# Patient Record
Sex: Male | Born: 1940 | Race: White | Hispanic: No | Marital: Married | Smoking: Former smoker
Health system: Southern US, Community
[De-identification: ages and names within clinical notes are randomized; demographics above are authoritative.]

## PROBLEM LIST (undated history)

## (undated) DIAGNOSIS — I2510 Atherosclerotic heart disease of native coronary artery without angina pectoris: Secondary | ICD-10-CM

## (undated) DIAGNOSIS — K219 Gastro-esophageal reflux disease without esophagitis: Secondary | ICD-10-CM

## (undated) DIAGNOSIS — E785 Hyperlipidemia, unspecified: Secondary | ICD-10-CM

## (undated) DIAGNOSIS — G629 Polyneuropathy, unspecified: Secondary | ICD-10-CM

## (undated) DIAGNOSIS — M1990 Unspecified osteoarthritis, unspecified site: Secondary | ICD-10-CM

## (undated) DIAGNOSIS — J342 Deviated nasal septum: Secondary | ICD-10-CM

## (undated) DIAGNOSIS — E669 Obesity, unspecified: Secondary | ICD-10-CM

## (undated) DIAGNOSIS — Z951 Presence of aortocoronary bypass graft: Secondary | ICD-10-CM

## (undated) DIAGNOSIS — R0602 Shortness of breath: Secondary | ICD-10-CM

## (undated) DIAGNOSIS — I219 Acute myocardial infarction, unspecified: Secondary | ICD-10-CM

## (undated) DIAGNOSIS — G4730 Sleep apnea, unspecified: Secondary | ICD-10-CM

## (undated) DIAGNOSIS — R112 Nausea with vomiting, unspecified: Secondary | ICD-10-CM

## (undated) DIAGNOSIS — I509 Heart failure, unspecified: Secondary | ICD-10-CM

## (undated) DIAGNOSIS — I6521 Occlusion and stenosis of right carotid artery: Secondary | ICD-10-CM

## (undated) DIAGNOSIS — I10 Essential (primary) hypertension: Secondary | ICD-10-CM

## (undated) DIAGNOSIS — Z9889 Other specified postprocedural states: Secondary | ICD-10-CM

## (undated) DIAGNOSIS — Z973 Presence of spectacles and contact lenses: Secondary | ICD-10-CM

## (undated) DIAGNOSIS — C4492 Squamous cell carcinoma of skin, unspecified: Secondary | ICD-10-CM

## (undated) DIAGNOSIS — E041 Nontoxic single thyroid nodule: Secondary | ICD-10-CM

## (undated) DIAGNOSIS — E039 Hypothyroidism, unspecified: Secondary | ICD-10-CM

## (undated) HISTORY — PX: CARDIAC CATHETERIZATION: SHX172

## (undated) HISTORY — PX: TRANSESOPHAGEAL ECHOCARDIOGRAM: SHX273

## (undated) HISTORY — PX: COLONOSCOPY: SHX174

## (undated) HISTORY — DX: Acute myocardial infarction, unspecified: I21.9

## (undated) HISTORY — DX: Nontoxic single thyroid nodule: E04.1

## (undated) HISTORY — DX: Essential (primary) hypertension: I10

## (undated) HISTORY — PX: NASAL SEPTUM SURGERY: SHX37

## (undated) HISTORY — DX: Hyperlipidemia, unspecified: E78.5

## (undated) HISTORY — DX: Presence of spectacles and contact lenses: Z97.3

## (undated) HISTORY — PX: CORONARY STENT PLACEMENT: SHX1402

## (undated) HISTORY — DX: Deviated nasal septum: J34.2

## (undated) HISTORY — DX: Sleep apnea, unspecified: G47.30

## (undated) HISTORY — PX: CAROTID ENDARTERECTOMY: SUR193

## (undated) HISTORY — DX: Gastro-esophageal reflux disease without esophagitis: K21.9

## (undated) HISTORY — DX: Heart failure, unspecified: I50.9

## (undated) HISTORY — DX: Atherosclerotic heart disease of native coronary artery without angina pectoris: I25.10

## (undated) HISTORY — PX: UPPER GASTROINTESTINAL ENDOSCOPY: SHX188

## (undated) HISTORY — PX: ADENOIDECTOMY: SUR15

## (undated) HISTORY — DX: Obesity, unspecified: E66.9

## (undated) HISTORY — DX: Occlusion and stenosis of right carotid artery: I65.21

---
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
## 1995-08-15 DIAGNOSIS — I219 Acute myocardial infarction, unspecified: Secondary | ICD-10-CM

## 1995-08-15 HISTORY — DX: Acute myocardial infarction, unspecified: I21.9

## 2006-12-27 ENCOUNTER — Ambulatory Visit: Payer: Self-pay | Admitting: *Deleted

## 2007-06-27 ENCOUNTER — Ambulatory Visit: Payer: Self-pay | Admitting: *Deleted

## 2008-01-08 ENCOUNTER — Ambulatory Visit: Payer: Self-pay | Admitting: *Deleted

## 2008-02-29 ENCOUNTER — Emergency Department (HOSPITAL_COMMUNITY): Admission: EM | Admit: 2008-02-29 | Discharge: 2008-02-29 | Payer: Self-pay | Admitting: Emergency Medicine

## 2008-07-08 ENCOUNTER — Ambulatory Visit: Payer: Self-pay | Admitting: *Deleted

## 2008-07-21 ENCOUNTER — Ambulatory Visit: Payer: Self-pay | Admitting: Vascular Surgery

## 2008-11-25 ENCOUNTER — Emergency Department (HOSPITAL_COMMUNITY): Admission: EM | Admit: 2008-11-25 | Discharge: 2008-11-25 | Payer: Self-pay | Admitting: Emergency Medicine

## 2009-02-09 ENCOUNTER — Ambulatory Visit: Payer: Self-pay | Admitting: Vascular Surgery

## 2009-08-11 ENCOUNTER — Ambulatory Visit: Payer: Self-pay | Admitting: Vascular Surgery

## 2010-01-20 ENCOUNTER — Ambulatory Visit: Payer: Self-pay | Admitting: Vascular Surgery

## 2010-07-27 ENCOUNTER — Ambulatory Visit: Payer: Self-pay | Admitting: Vascular Surgery

## 2010-09-02 ENCOUNTER — Ambulatory Visit (HOSPITAL_COMMUNITY)
Admission: RE | Admit: 2010-09-02 | Discharge: 2010-09-02 | Payer: Self-pay | Source: Home / Self Care | Attending: Internal Medicine | Admitting: Internal Medicine

## 2010-09-02 IMAGING — US US BIOPSY
1 series · 8 of 8 positions shown · non-contrast
Comparison: none

CLINICAL DATA: Complex cystic and solid right thyroid mass

ULTRASOUND GUIDED FINE NEEDLE ASPIRATION BIOPSY OF THYROID NODULE:
TECHNIQUE: Written informed consent for procedure obtained after discussion of
procedure and risks.
Time-out protocol performed.
Right lobe thyroid mass localized by ultrasound.
Mass measured 3.7 x 4.0 x 3.8 cm in size on sonography performed
immediately prior to the biopsy procedure.

[Series 1: us biopsy · 0.11mm/px · 8 of 8 slices shown]
[im 1/8]
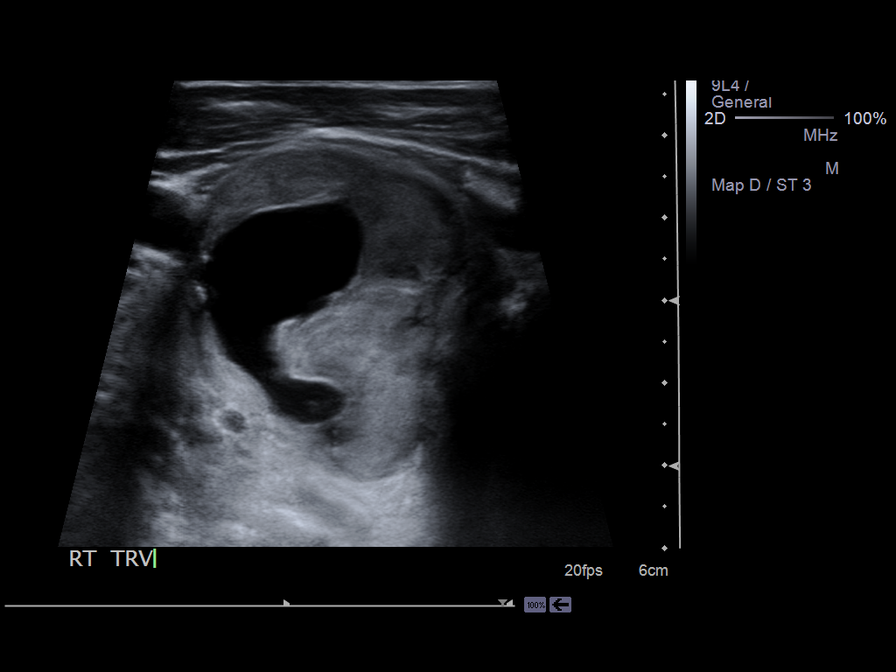
[im 2/8]
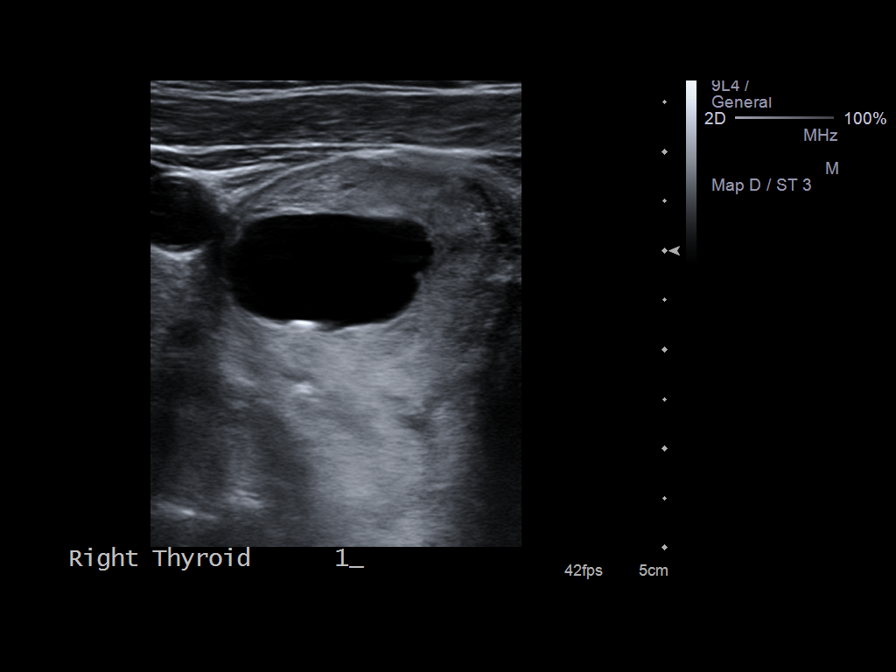
[im 3/8]
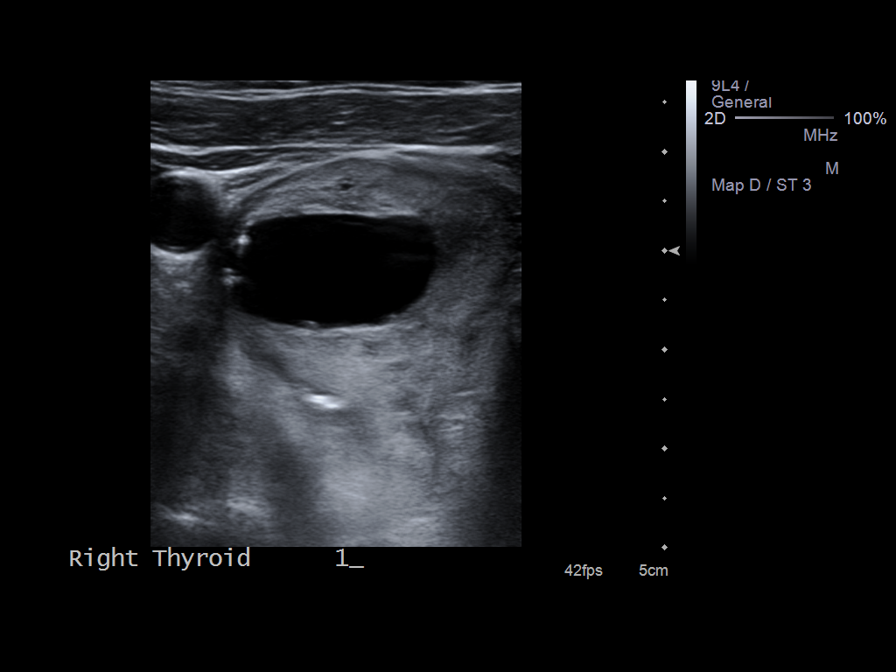
[im 4/8]
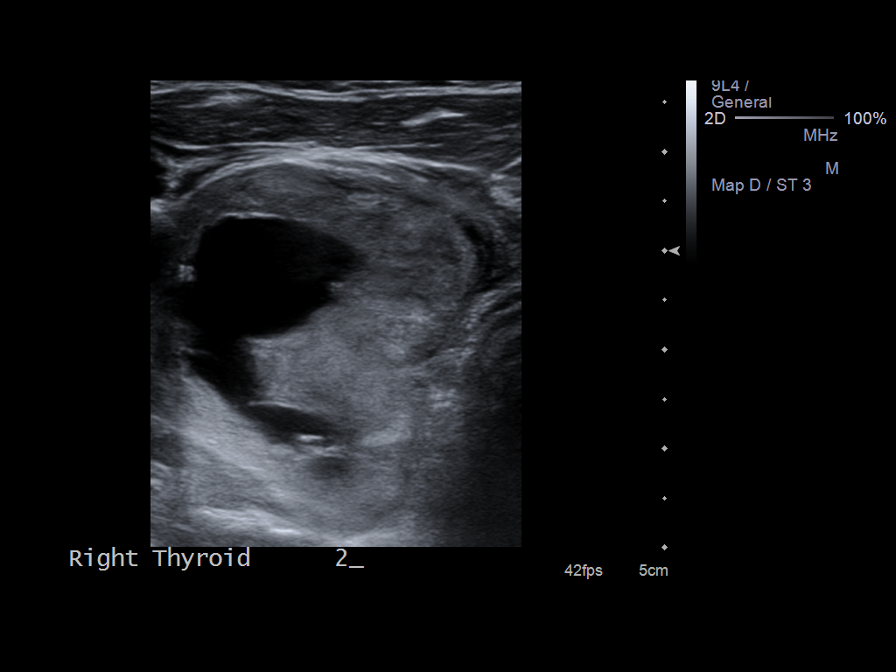
[im 5/8]
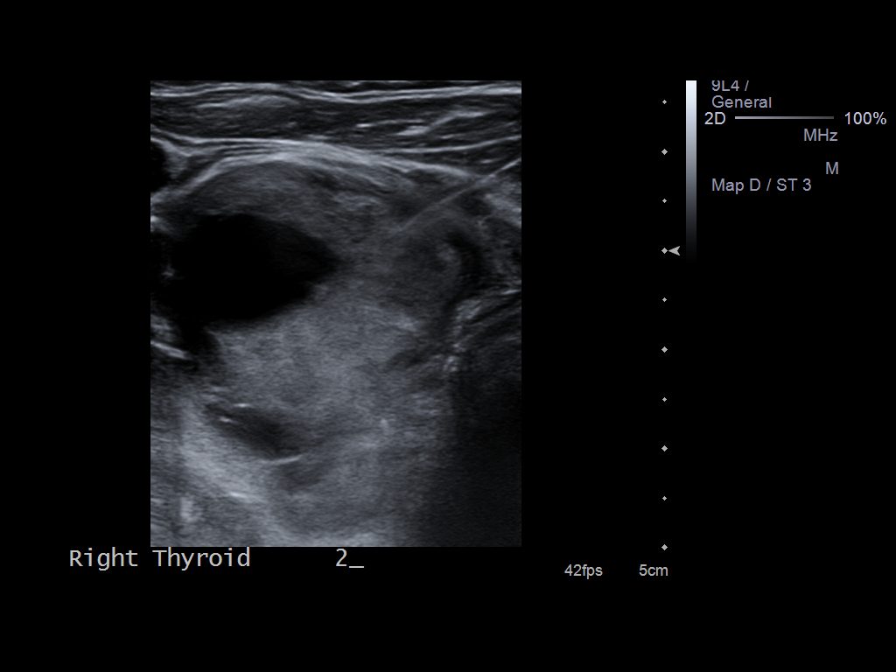
[im 6/8]
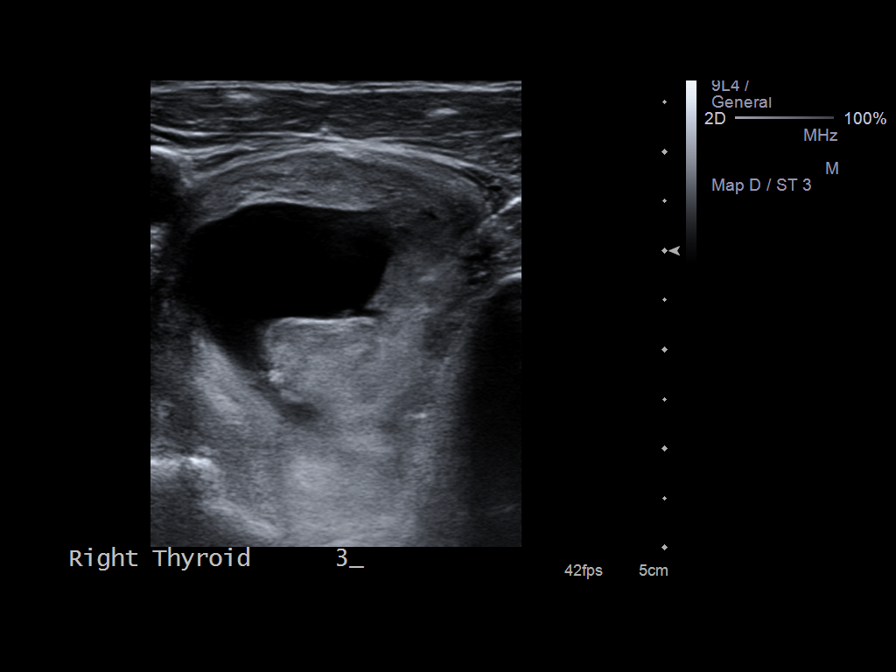
[im 7/8]
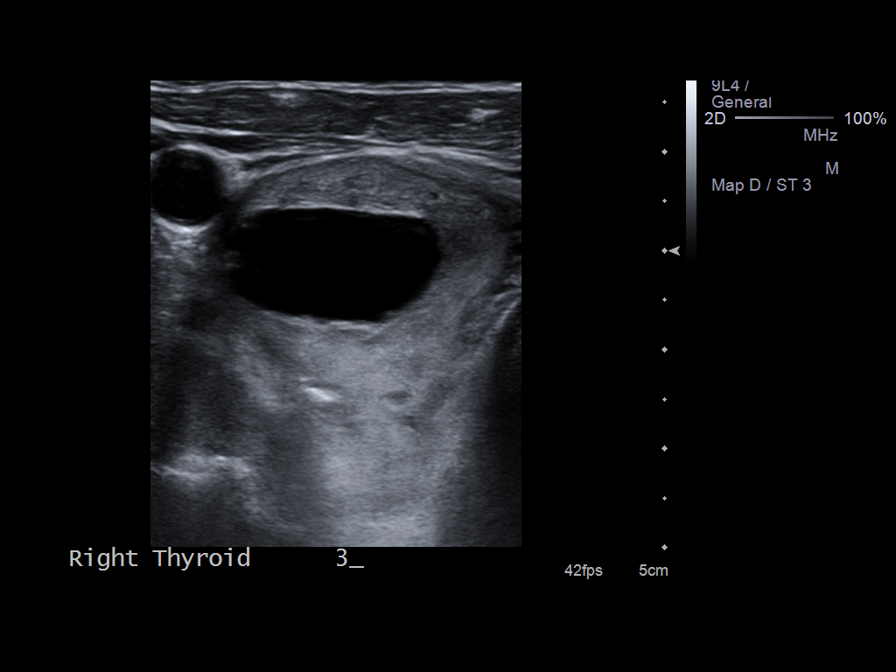
[im 8/8]
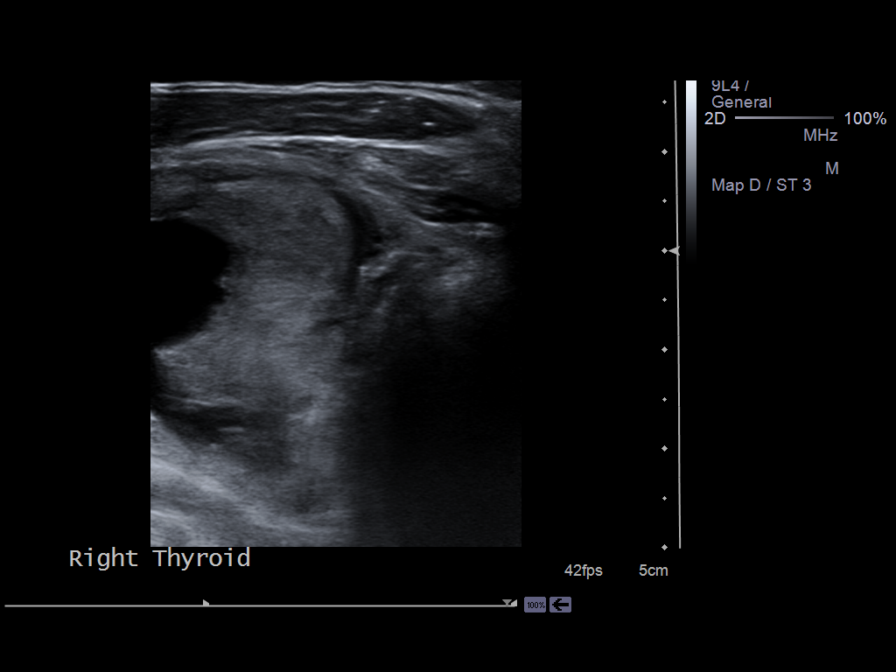

[8 of 8 positions shown; findings below may reference images not displayed]

Skin prepped and draped in usual sterile fashion.
Skin and soft tissues anesthestized with 2 ml of 1% lidocaine.
Under sonographic guidance, three 25-gauge fine needle aspiration
biopsies of the solid portion of the complex cystic/solid right
thyroid mass were performed.
Procedure tolerated well by patient without immediate complication.
Hemostasis obtained by manual compression.
No evidence of hematoma on postprocedural images.
Standard post procedure instructions were given to patient.
Specimen sent to laboratory for routine cytologic analysis.
IMPRESSION: Ultrasound guided fine needle aspiration biopsy of a right lobe
thyroid mass.

## 2010-09-02 IMAGING — US US SOFT TISSUE HEAD/NECK
1 series · 14 of 25 positions shown · non-contrast
Comparison: None

CLINICAL DATA: Right thyroid nodule

THYROID ULTRASOUND
TECHNIQUE: Ultrasound examination of the thyroid gland and adjacent
soft tissues was performed.

[Series 1: us soft tissue head/neck · 0.11mm/px · 14 of 32 slices shown]
[im 1/32]
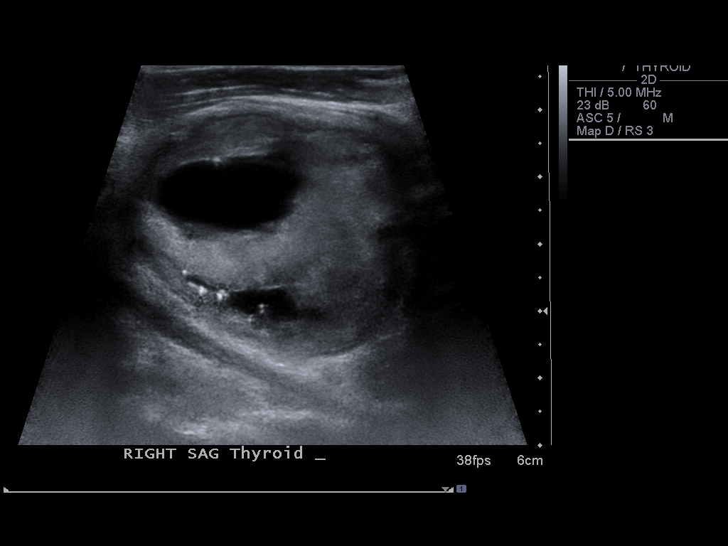
[im 3/32]
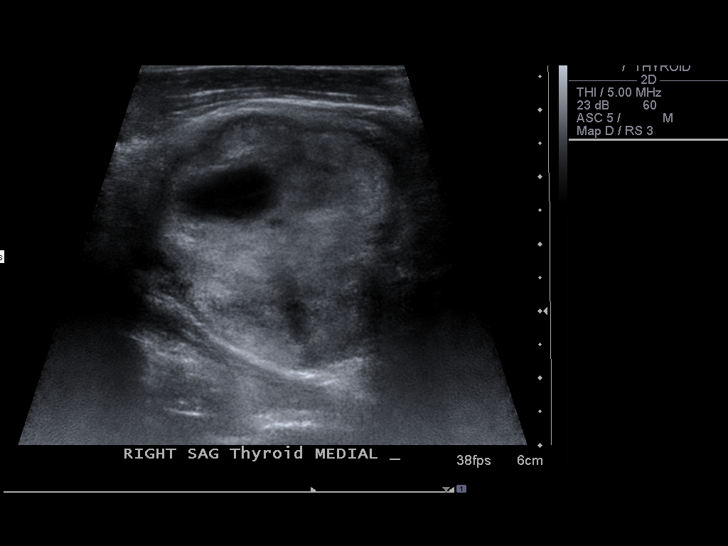
[im 6/32]
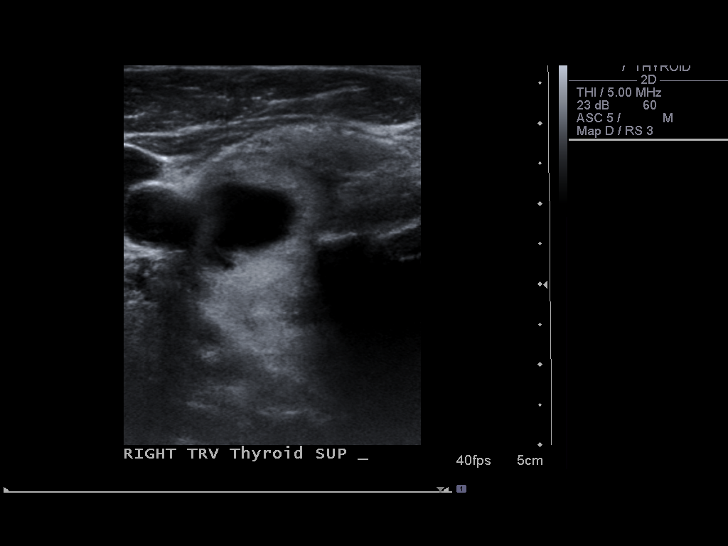
[im 8/32]
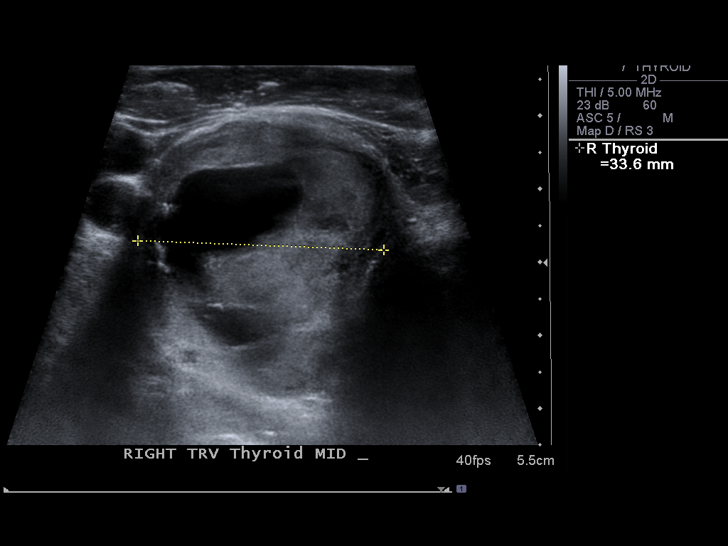
[im 11/32]
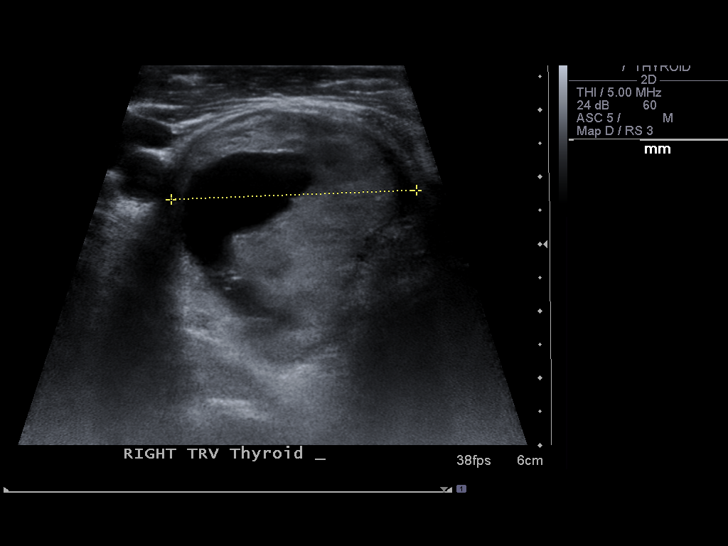
[im 12/32]
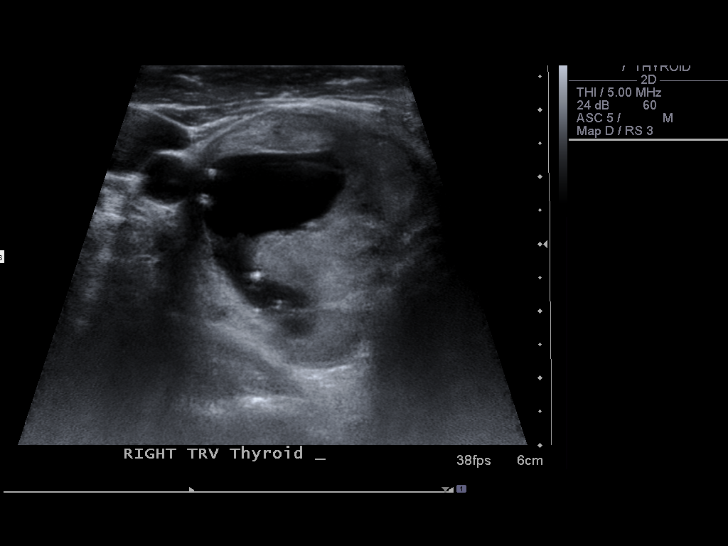
[im 15/32]
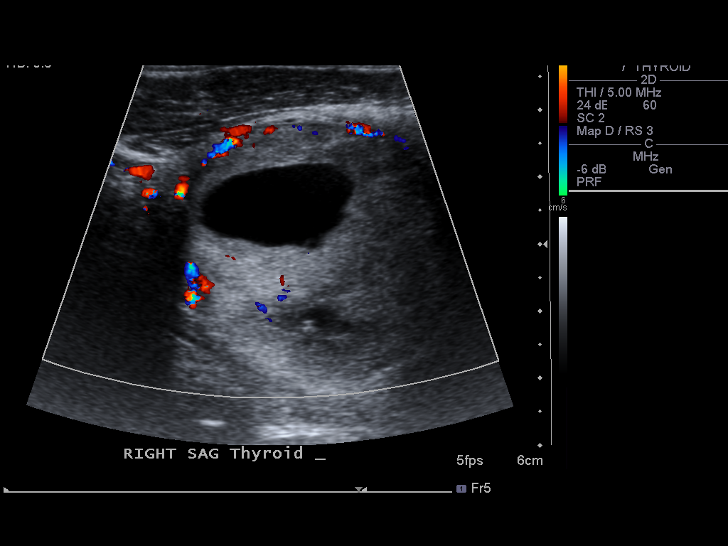
[im 17/32]
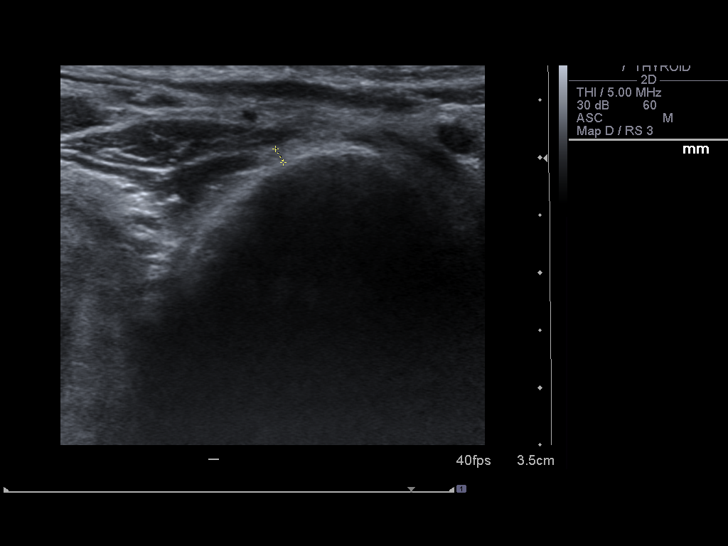
[im 20/32]
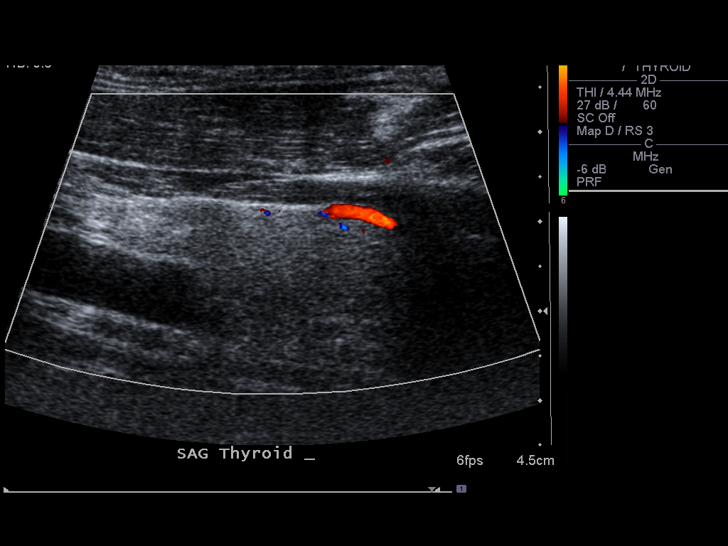
[im 21/32]
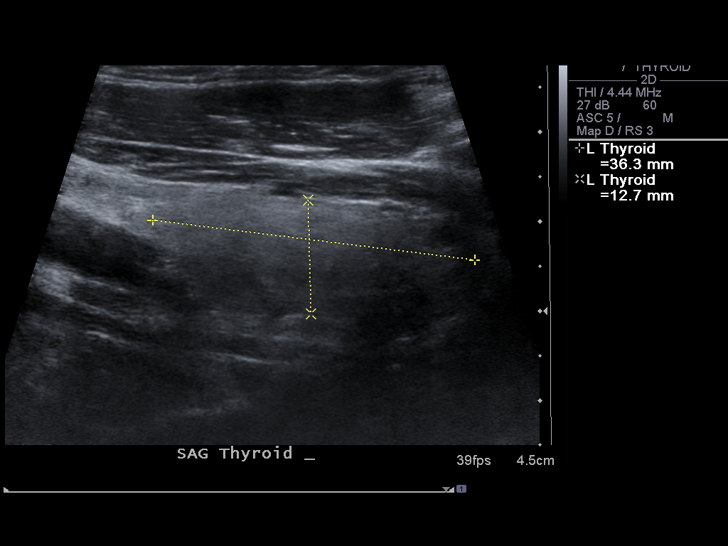
[im 24/32]
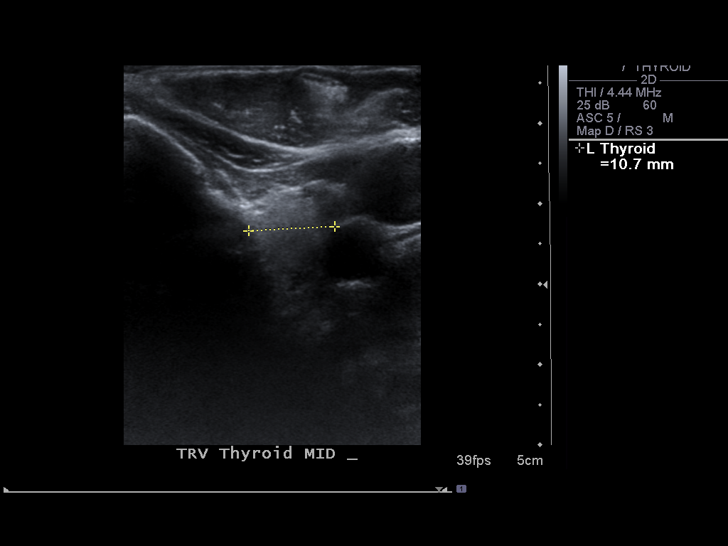
[im 26/32]
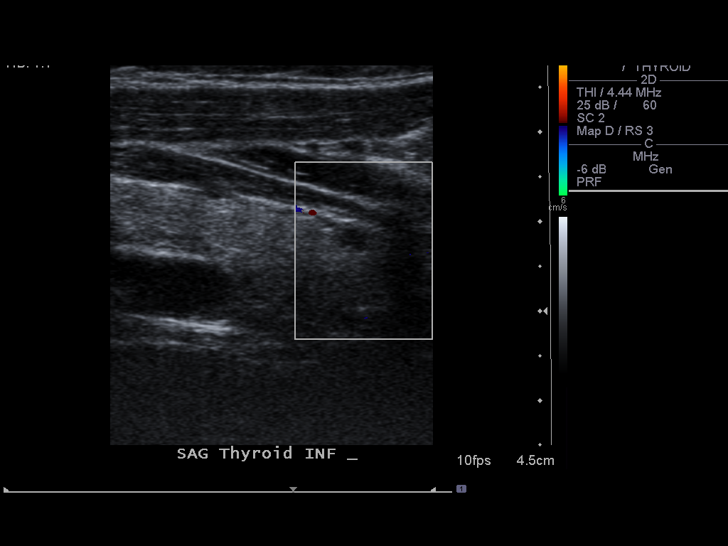
[im 29/32]
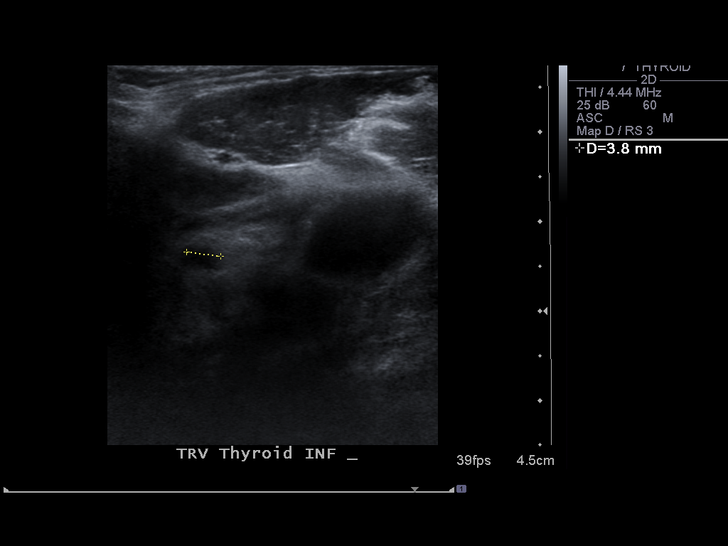
[im 32/32]
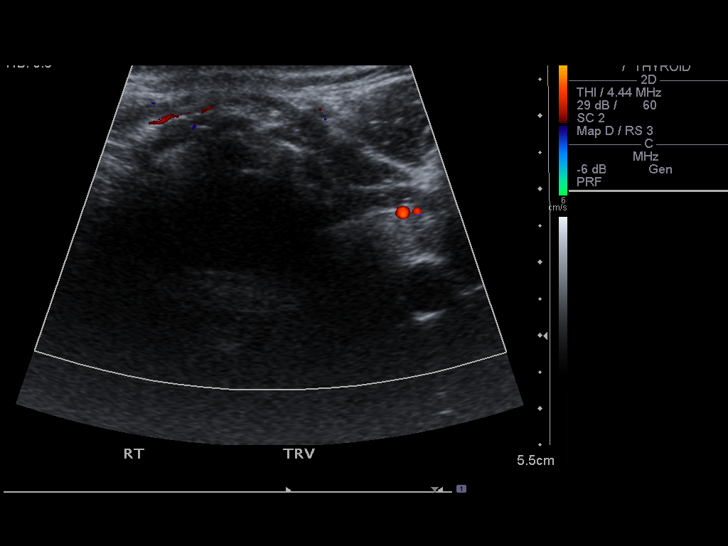

[14 of 25 positions shown; findings below may reference images not displayed]

FINDINGS: Right thyroid lobe:  4.9 x 3.4 x 3.4 cm
Left thyroid lobe:  3.6 x 1.3 x 1.1 cm
Isthmus:  1 mm thick, normal

Focal nodules:  Homogeneous thyroid parenchyma.  Dominant mass
inferior pole right thyroid lobe, 3.7 x 4.0 x 3.8 cm, containing
soft tissue components, areas of cystic change, as well as a few
tiny hyperechoic calcifications.  Mass is indeterminate in
etiology.  Additional tiny nodule 3 x 3 x 4 mm diameter inferior
pole left lobe.

Lymphadenopathy:  Not identified
IMPRESSION: Dominant right thyroid mass at inferior pole, complex cystic and
solid in appearance, 3.7 x 4.0 x 3.8 cm.
Malignancy is not excluded.
Tissue diagnosis is recommended.
Lesion is accessible by ultrasound-guided fine needle aspiration
biopsy.

## 2010-12-27 NOTE — Procedures (Signed)
CAROTID DUPLEX EXAM   INDICATION:  Carotid disease.   HISTORY:  Diabetes:  No.  Cardiac:  MI, PTCA, stents.  Hypertension:  Yes.  Smoking:  No.  Previous Surgery:  No.  CV History:  Asymptomatic.  Amaurosis Fugax No, Paresthesias No, Hemiparesis No.                                       RIGHT             LEFT  Brachial systolic pressure:         140               142  Brachial Doppler waveforms:         Normal            Normal  Vertebral direction of flow:        Antegrade         Not visualized  DUPLEX VELOCITIES (cm/sec)  CCA peak systolic                   94                71  ECA peak systolic                   262               138  ICA peak systolic                   285               148  ICA end diastolic                   91                50  PLAQUE MORPHOLOGY:                  Mixed             Mixed  PLAQUE AMOUNT:                      Moderate/severe   Moderate  PLAQUE LOCATION:                    ICA/ECA           ICA/ECA   IMPRESSION:  1. High-end 60-79% stenosis of the right internal carotid artery.  2. 40-59% stenosis of the left internal carotid artery.  3. No significant change noted when compared to the previous      examination on 07/08/08.   ___________________________________________  Di Kindle. Edilia Bo, M.D.   CH/MEDQ  D:  02/09/2009  T:  02/09/2009  Job:  [id number]

## 2010-12-27 NOTE — Procedures (Signed)
CAROTID DUPLEX EXAM   INDICATION:  Followup evaluation of known coronary artery disease.  Previous study performed on 12/27/2006 at [facility]  revealed a 60-79% right ICA stenosis and a 40-59% left ICA stenosis.   HISTORY:  Diabetes:  No  Cardiac:  History of MI and PTCA and stent in 1995  Hypertension:  Yes  Smoking:  Previous smoker  Previous Surgery:  No  CV History:  The patient reports a 24-hour dizzy spell approximately two  months ago  Amaurosis Fugax No, Paresthesias No, Hemiparesis No                                       RIGHT             LEFT  Brachial systolic pressure:         188               186  Brachial Doppler waveforms:         Triphasic         Triphasic  Vertebral direction of flow:        Antegrade         Occluded  DUPLEX VELOCITIES (cm/sec)  CCA peak systolic                   69                60  ECA peak systolic                   245               117  ICA peak systolic                   215               104  ICA end diastolic                   70                35  PLAQUE MORPHOLOGY:                  Mixed irregular   Mixed irregular  PLAQUE AMOUNT:                      Moderate          Moderate  PLAQUE LOCATION:                    Proximal ICA      Proximal ICA   IMPRESSION:  1. 60-79% right ICA stenosis.  2. 20-39% left ICA stenosis.  3. Right ECA stenosis.  4. Left vertebral artery occlusion.  5. No significant change since previous study.   ___________________________________________  P. Liliane Bade, M.D.   MC/MEDQ  D:  06/27/2007  T:  06/28/2007  Job:  [id number]

## 2010-12-27 NOTE — Procedures (Signed)
CAROTID DUPLEX EXAM   INDICATION:  Followup carotid artery disease.   HISTORY:  Diabetes:  No.  Cardiac:  MI, PTCA, stents.  Hypertension:  Yes.  Smoking:  No.  Previous Surgery:  No.  CV History:  Asymptomatic.  Amaurosis Fugax No, Paresthesias No, Hemiparesis No                                       RIGHT             LEFT  Brachial systolic pressure:         162               168  Brachial Doppler waveforms:         Normal            Normal  Vertebral direction of flow:        Antegrade         Not visualized  DUPLEX VELOCITIES (cm/sec)  CCA peak systolic                   68                64  ECA peak systolic                   328               134  ICA peak systolic                   258               127  ICA end diastolic                   94                43  PLAQUE MORPHOLOGY:                  Mixed             Mixed  PLAQUE AMOUNT:                      Moderate to severe                  Moderate  PLAQUE LOCATION:                    ICA, ECA          ICA, ECA   IMPRESSION:  1. Doppler velocity suggests high end 60%-79% stenosis in right      internal carotid artery.  Doppler velocity suggests 40%-59%      stenosis in left internal carotid artery.  2. Bilateral external carotid artery stenosis.  3. Antegrade in right vertebral artery.  Left vertebral artery not      visualized.   ___________________________________________  Di Kindle. Edilia Bo, M.D.   NT/MEDQ  D:  01/20/2010  T:  01/20/2010  Job:  [id number]

## 2010-12-27 NOTE — Assessment & Plan Note (Signed)
OFFICE VISIT   [name], [name]  DOB:  12/20/1940                                       07/21/2008  EAVWU#:[id number]   I saw the patient in the office today for continued followup of his  carotid disease.  This is a pleasant 70 year old gentleman who I have  been following with a moderate right carotid stenosis which has been in  the 60-79% range.  He comes in for a 6 month followup visit.  Of note,  he is right-handed.  He has had no previous history of stroke, TIAs,  expressive or receptive aphasia or amaurosis fugax.  He does state that  he has had occasional episodes of paresthesias in the left hand which  usually occurs when he is sleeping in a certain position or when he is  driving with his left arm up on the wheel.  His symptoms are relieved  with the change in position of his arms.  Based on his history it does  not sound like he is having right hemispheric TIAs.  He does take 81 mg  of aspirin a day.   PAST MEDICAL HISTORY:  Is significant for myocardial infarction in the  mid 90s.  He quit tobacco in 1995.   REVIEW OF SYSTEMS:  CARDIOVASCULAR:  He has had no recent chest pain,  chest pressure, palpitations or arrhythmias.  He does admit to some  dyspnea on exertion.  PULMONARY:  He has had no productive cough, bronchitis, asthma or  wheezing.  Review of systems is otherwise unremarkable.   PHYSICAL EXAMINATION:  General:  This is a pleasant 70 year old  gentleman who appears his stated age.  Vital signs:  His blood pressure  is 175/88, heart rate is 59.  Neck:  Supple.  He has no cervical  lymphadenopathy.  I cannot detect a carotid bruit today.  Lungs:  Clear  bilaterally to auscultation.  Cardiac:  He has a regular rate and  rhythm.  Abdomen:  Soft and nontender.  He has palpable femoral pulses  and warm well-perfused feet without ischemic ulceration.  He has no  significant lower extremity swelling.  Neurologic:  Exam is nonfocal.   His  carotid duplex scan shows he has a 60-79% right carotid stenosis.  The velocities have remained fairly stable since his previous study back  in May of this year.  On the left side he has a 40-59% stenosis.   Again, I am not convinced that the occasional paresthesias that he is  having in his hand which appear to be positional are related to his  carotid disease.  For this reason we would not consider right carotid  endarterectomy unless the stenosis progressed to greater than 80% or he  developed clear-cut right hemispheric symptoms.  We will see him back in  6 months with a followup duplex scan.  He knows to call sooner if he has  problems.  In the meantime he knows to continue taking his aspirin.  He  will follow up with his primary care physician concerning his blood  pressure.   Di Kindle. Edilia Bo, M.D.  Electronically Signed   CSD/MEDQ  D:  07/21/2008  T:  07/22/2008  Job:  [id number]   cc:   Catalina Pizza, M.D.

## 2010-12-27 NOTE — Procedures (Signed)
CAROTID DUPLEX EXAM   INDICATION:  Followup carotid artery disease.   HISTORY:  Diabetes:  No.  Cardiac:  MI, PTCA, stents.  Hypertension:  Yes.  Smoking:  No.  Previous Surgery:  No.  CV History:  Asymptomatic.  Amaurosis Fugax No, Paresthesias No, Hemiparesis No                                       RIGHT             LEFT  Brachial systolic pressure:         172               166  Brachial Doppler waveforms:         WNL               WNL  Vertebral direction of flow:        Antegrade         Not visualized  DUPLEX VELOCITIES (cm/sec)  CCA peak systolic                   84                68  ECA peak systolic                   331               214  ICA peak systolic                   277               150  ICA end diastolic                   95                47  PLAQUE MORPHOLOGY:                  Mixed             Mixed  PLAQUE AMOUNT:                      Moderate / severe Moderate  PLAQUE LOCATION:                    ICA / ECA         ICA / ECA   IMPRESSION:  1. Right internal carotid artery shows evidence of 60%-79% stenosis      (top end of range).  2. Left internal carotid artery shows evidence of 40%-59% stenosis.  3. Bilateral external carotid artery stenosis.  4. Right vertebral artery appears antegrade, left vertebral artery      flow not visualized.  5. No significant changes from previous study.   ___________________________________________  Di Kindle. Edilia Bo, M.D.   AS/MEDQ  D:  08/11/2009  T:  08/11/2009  Job:  [id number]

## 2010-12-27 NOTE — Assessment & Plan Note (Signed)
OFFICE VISIT   BEAU, RAMSBURG  DOB:  [birth year]                                       02/09/2009  OZHYQ#:[id number]   I saw the patient in the office today for continued followup of his  carotid disease.  This is a pleasant 70 year old gentleman who I have  been following with bilateral carotid disease.  Since I saw him last in  December of 2009 he denies any history of stroke, TIAs, expressive or  receptive aphasia, or amaurosis fugax.  He continues on an aspirin a  day.   SOCIAL HISTORY:  He quit tobacco several years ago.   REVIEW OF SYSTEMS:  He has had no recent chest pain, chest pressure,  palpitations or arrhythmias.  He has had no productive cough,  bronchitis, asthma or wheezing.   PHYSICAL EXAMINATION:  This is a pleasant 70 year old gentleman who  appears his stated age.  His blood pressure is 180/96, heart rate is 57.  Neck is supple.  He has a right carotid bruit.  Lungs are clear  bilaterally to auscultation.  On cardiac exam he has a regular rate and  rhythm.  Neurologic exam is nonfocal.   Carotid duplex scan shows a 60-79% right carotid stenosis.  The  velocities have not changed significantly over the last 6 months.  On  the left side he has a 40-59% stenosis.   He understands we would not consider right carotid endarterectomy unless  the stenosis progressed to greater than 80% or he developed new  neurologic symptoms.  I will see him back in 6 months with a followup  duplex scan.  He knows to call sooner if he has problems.  In the  meantime he knows to continue taking his aspirin.   Di Kindle. Edilia Bo, M.D.  Electronically Signed   CSD/MEDQ  D:  02/09/2009  T:  02/10/2009  Job:  [id number]   cc:   Catalina Pizza, M.D.

## 2010-12-27 NOTE — Procedures (Signed)
CAROTID DUPLEX EXAM   INDICATION:  Followup carotid artery disease.   HISTORY:  Diabetes:  No.  Cardiac:  MI, PTCA and stents 1995.  Hypertension:  Yes.  Smoking:  No.  Previous Surgery:  No.  CV History:  No.  Amaurosis Fugax No, Paresthesias , Hemiparesis No  Intermittent numbness in arms for approximately the last 2 weeks when  waking up or driving L > R.                                       RIGHT             LEFT  Brachial systolic pressure:         172               160  Brachial Doppler waveforms:         WNL               WNL  Vertebral direction of flow:        Antegrade         Not visualized  DUPLEX VELOCITIES (cm/sec)  CCA peak systolic                   83                63  ECA peak systolic                   323               138  ICA peak systolic                   280               121  ICA end diastolic                   95                42  PLAQUE MORPHOLOGY:                  Calcified         Mixed  PLAQUE AMOUNT:                      Moderate/severe   Mild  PLAQUE LOCATION:                    ICA/ECA           ICA/ECA   IMPRESSION:  1. Right ICA shows evidence of 60-79% stenosis.  2. Left ICA shows evidence of 40-59% stenosis (low end of range).  3. Left ECA stenosis.  4. Left vertebral artery flow not detected, possible occlusion.  Right      is antegrade.  5. No significant changes from previous study.  6. Dr. Arbie Cookey was notified of results and followup scheduled with Dr.      Edilia Bo.   ___________________________________________  [name] [name]. Edilia Bo, M.D.   AS/MEDQ  D:  07/08/2008  T:  07/08/2008  Job:  [id number]

## 2010-12-27 NOTE — Procedures (Signed)
CAROTID DUPLEX EXAM   INDICATION:  Followup, carotid artery disease.   HISTORY:  Diabetes:  No.  Cardiac:  History of MI, PTCA, and stent in 1995.  Hypertension:  Yes.  Smoking:  No.  Previous Surgery:  No.  CV History:  No.  Amaurosis Fugax No, Paresthesias No, Hemiparesis No                                       RIGHT             LEFT  Brachial systolic pressure:         170               172  Brachial Doppler waveforms:         Normal            Normal  Vertebral direction of flow:        Antegrade         Occluded  DUPLEX VELOCITIES (cm/sec)  CCA peak systolic                   110               69  ECA peak systolic                   343               105  ICA peak systolic                   221               120  ICA end diastolic                   74                33  PLAQUE MORPHOLOGY:                  Calcific          Heterogenous  PLAQUE AMOUNT:                      Moderate          Mild-to-moderate  PLAQUE LOCATION:                    ECA/ICA           ICA/ECA   IMPRESSION:  1. 60-79% stenosis of the right internal carotid artery.  2. High-end 20-39% stenosis of the left internal carotid artery.  3. The left vertebral artery appears occluded.  4. There is an incidental finding of a homogenous mass with a cystic      component noted in the right thyroid, measuring >3 cm.   ___________________________________________  P. Liliane Bade, M.D.   CH/MEDQ  D:  01/08/2008  T:  01/08/2008  Job:  [id number]   cc:   Dr. Winifred Olive

## 2011-01-18 ENCOUNTER — Encounter (INDEPENDENT_AMBULATORY_CARE_PROVIDER_SITE_OTHER): Payer: Self-pay | Admitting: Surgery

## 2011-01-23 ENCOUNTER — Encounter (INDEPENDENT_AMBULATORY_CARE_PROVIDER_SITE_OTHER): Payer: Self-pay | Admitting: Surgery

## 2011-01-30 ENCOUNTER — Encounter (INDEPENDENT_AMBULATORY_CARE_PROVIDER_SITE_OTHER): Payer: Self-pay | Admitting: General Surgery

## 2011-02-02 ENCOUNTER — Other Ambulatory Visit (INDEPENDENT_AMBULATORY_CARE_PROVIDER_SITE_OTHER): Payer: Self-pay | Admitting: Surgery

## 2011-02-02 ENCOUNTER — Encounter (HOSPITAL_COMMUNITY)
Admission: RE | Admit: 2011-02-02 | Discharge: 2011-02-02 | Disposition: A | Discharge status: Home / Self Care | Payer: Medicare HMO | Source: Ambulatory Visit | Attending: Surgery | Admitting: Surgery

## 2011-02-02 DIAGNOSIS — E041 Nontoxic single thyroid nodule: Secondary | ICD-10-CM

## 2011-02-02 LAB — URINALYSIS, ROUTINE W REFLEX MICROSCOPIC
Leukocytes, UA: NEGATIVE
Nitrite: NEGATIVE
Specific Gravity, Urine: 1.026 (ref 1.005–1.030)
Urobilinogen, UA: 0.2 mg/dL (ref 0.0–1.0)
pH: 6.5 (ref 5.0–8.0)

## 2011-02-02 LAB — CBC
Hemoglobin: 14.7 g/dL (ref 13.0–17.0)
MCHC: 34.7 g/dL (ref 30.0–36.0)
WBC: 7.7 10*3/uL (ref 4.0–10.5)

## 2011-02-02 LAB — DIFFERENTIAL
Basophils Absolute: 0 10*3/uL (ref 0.0–0.1)
Basophils Relative: 1 % (ref 0–1)
Lymphocytes Relative: 33 % (ref 12–46)
Monocytes Absolute: 0.7 10*3/uL (ref 0.1–1.0)
Neutro Abs: 4.1 10*3/uL (ref 1.7–7.7)
Neutrophils Relative %: 53 % (ref 43–77)

## 2011-02-02 LAB — PROTIME-INR
INR: 0.99 (ref 0.00–1.49)
Prothrombin Time: 13.3 seconds (ref 11.6–15.2)

## 2011-02-02 LAB — SURGICAL PCR SCREEN
MRSA, PCR: NEGATIVE
Staphylococcus aureus: NEGATIVE

## 2011-02-02 LAB — BASIC METABOLIC PANEL
Calcium: 9.8 mg/dL (ref 8.4–10.5)
Chloride: 102 mEq/L (ref 96–112)
Creatinine, Ser: 1.27 mg/dL (ref 0.50–1.35)
GFR calc Af Amer: >60 mL/min (ref >60)
Sodium: 139 mEq/L (ref 135–145)

## 2011-02-02 IMAGING — CR DG CHEST 2V
2 series · 2 of 2 positions shown · non-contrast
Comparison: None.

CLINICAL DATA: Preop for resection of thyroid nodule, former smoker

CHEST - 2 VIEW

[view not recorded (1 of 2)]
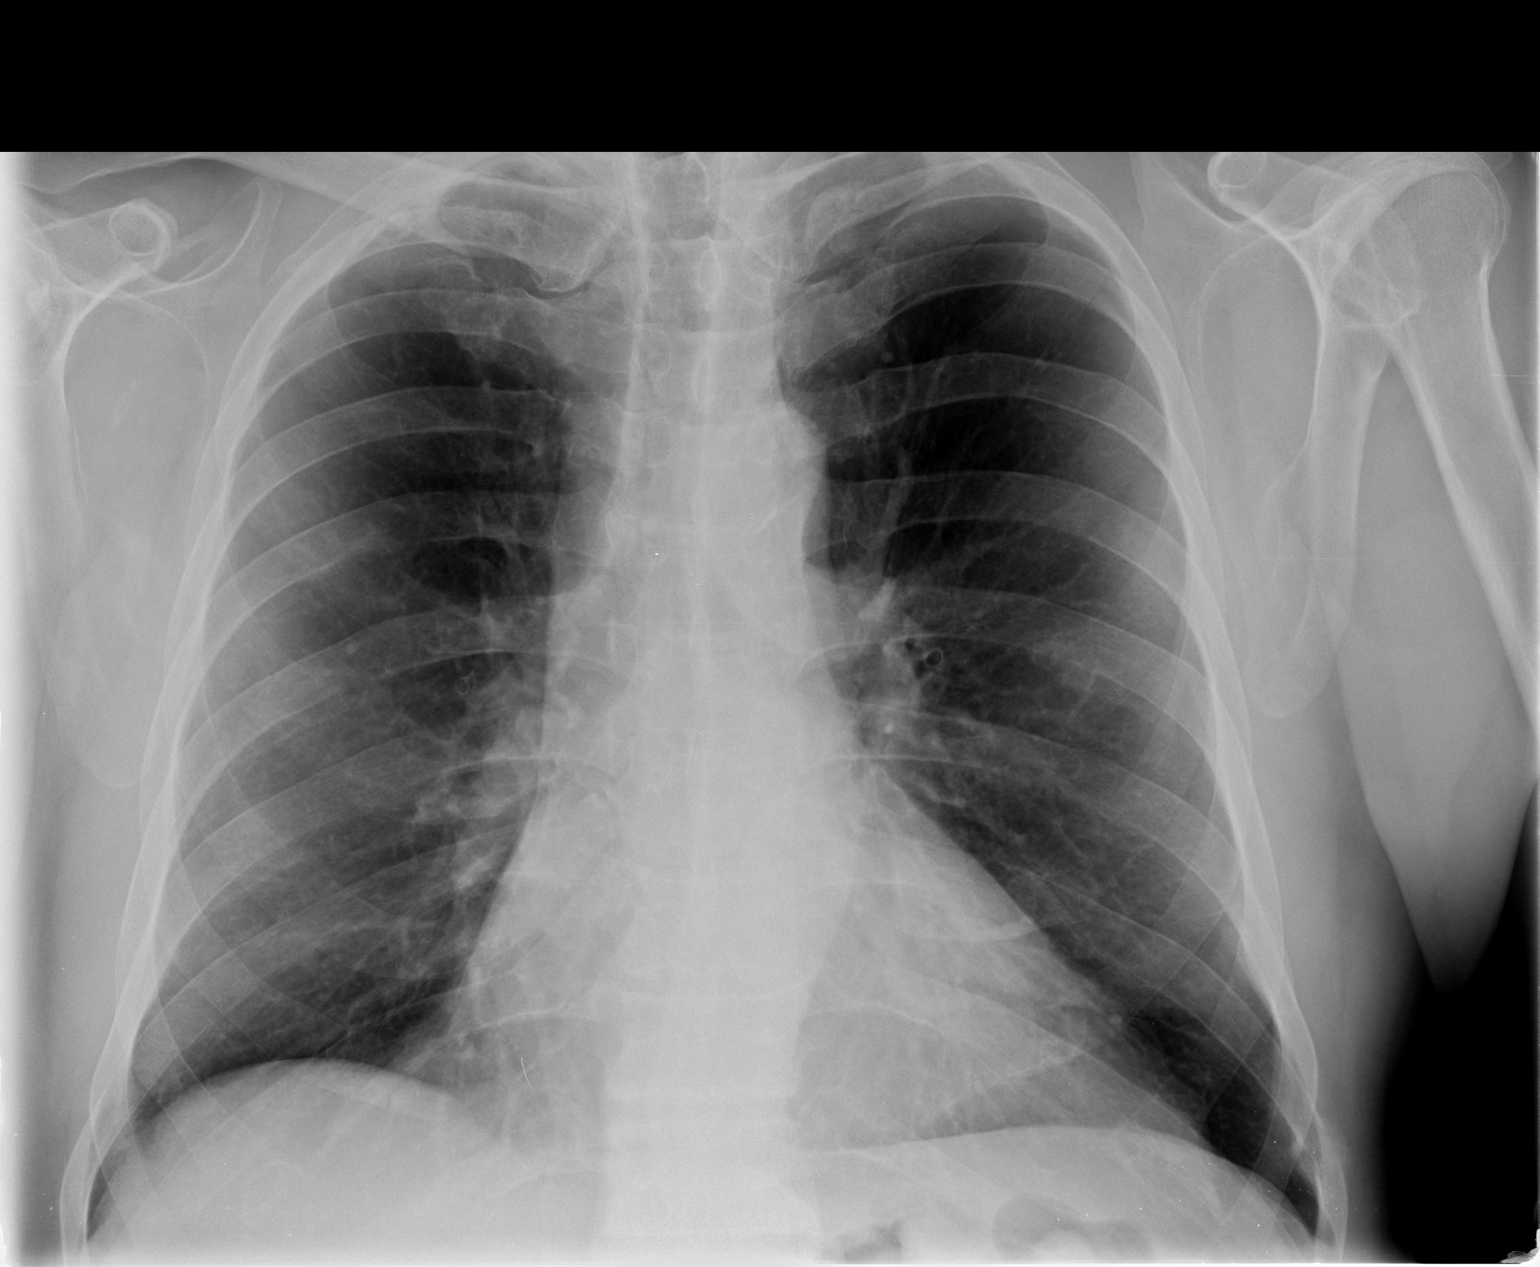

[view not recorded (2 of 2)]
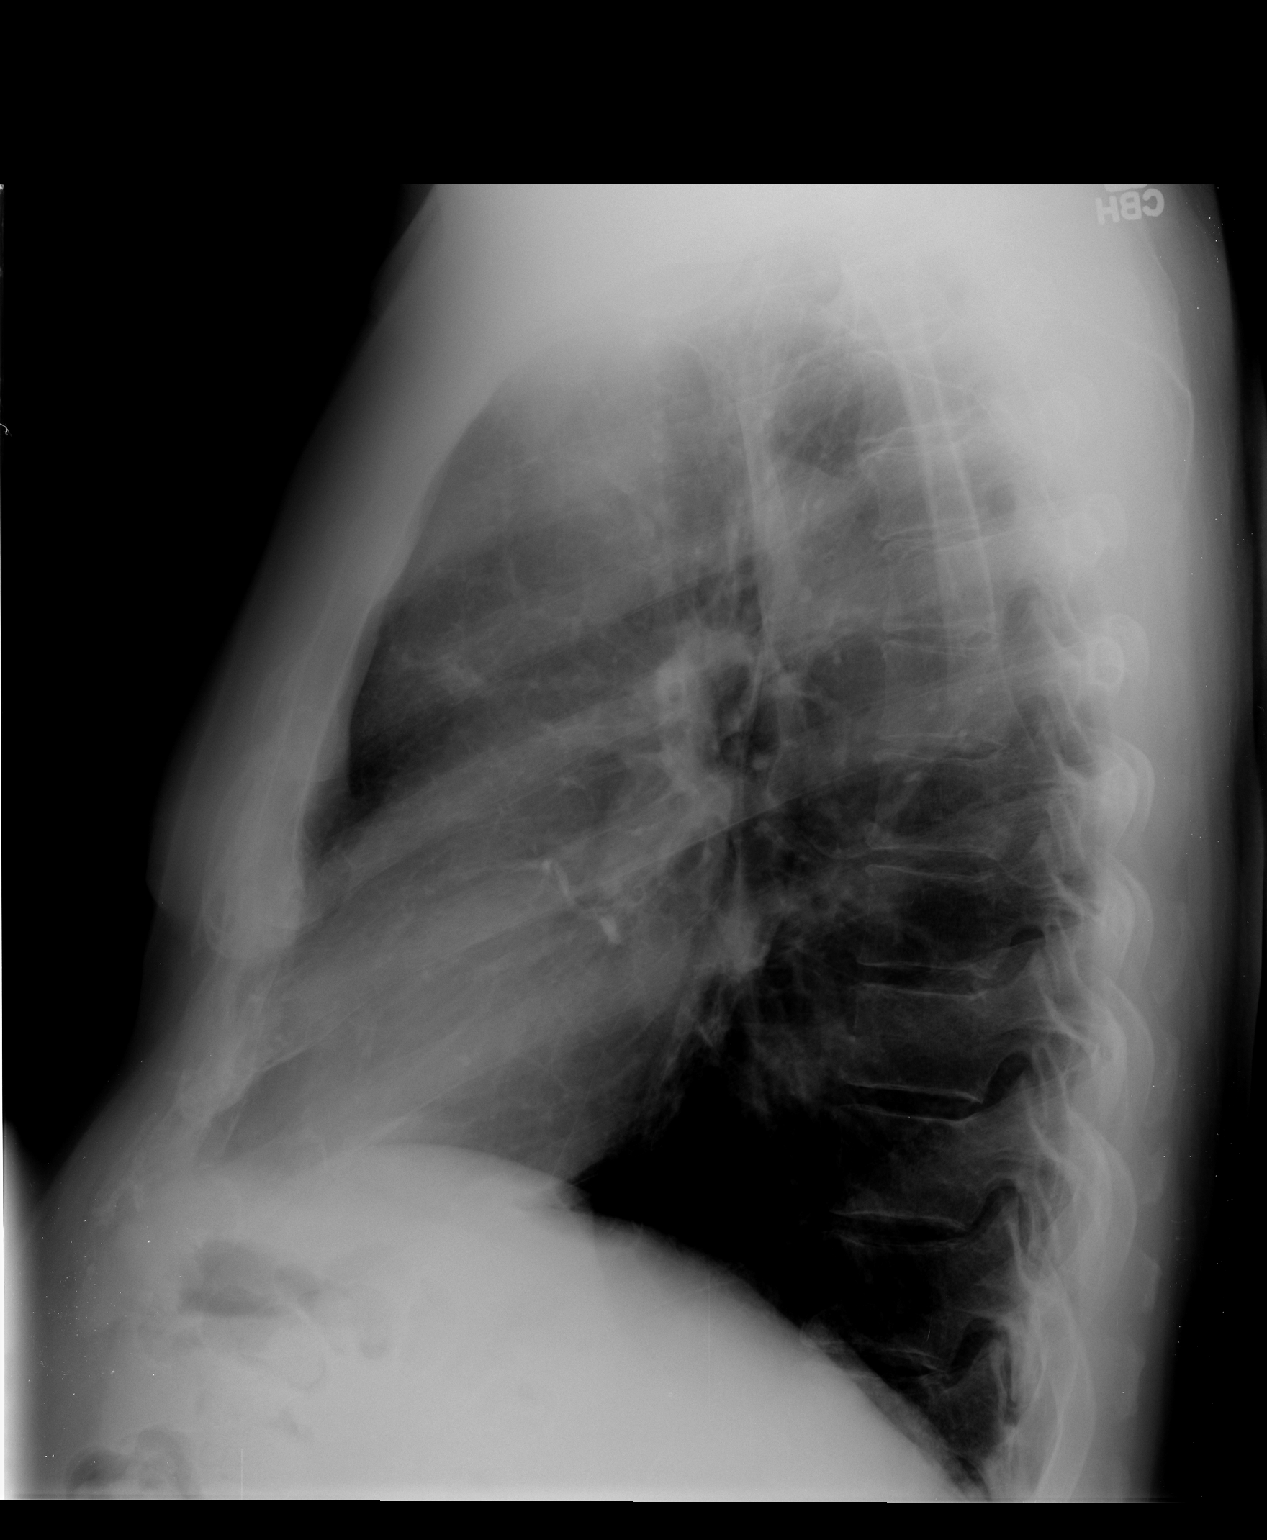

[2 of 2 positions shown; findings below may reference images not displayed]

FINDINGS: No definite active process is seen.  On the lateral view
there is a vague nodular opacity anteriorly in the retrosternal air
space.  This could represent an area of scarring, but a developing
pulmonary nodule cannot be excluded.  Either follow-up chest x-ray
or CT of the chest would be recommended. There is mild
peribronchial thickening present.  Mediastinal contours appear
normal.  The heart is mildly enlarged.  No bony abnormality is
seen.
IMPRESSION: 1.  Vague nodular opacity anteriorly on the lateral view.
Recommend comparison with prior or follow-up chest x-ray versus CT
of the chest.
2.  No active infiltrate or effusion.
3.  Mild cardiomegaly.

## 2011-02-06 ENCOUNTER — Ambulatory Visit (HOSPITAL_COMMUNITY)
Admission: RE | Admit: 2011-02-06 | Discharge: 2011-02-07 | Disposition: A | Discharge status: Home / Self Care | Payer: Medicare HMO | Source: Ambulatory Visit | Attending: Surgery | Admitting: Surgery

## 2011-02-06 ENCOUNTER — Other Ambulatory Visit (INDEPENDENT_AMBULATORY_CARE_PROVIDER_SITE_OTHER): Payer: Self-pay | Admitting: Surgery

## 2011-02-06 DIAGNOSIS — I658 Occlusion and stenosis of other precerebral arteries: Secondary | ICD-10-CM | POA: Insufficient documentation

## 2011-02-06 DIAGNOSIS — D34 Benign neoplasm of thyroid gland: Secondary | ICD-10-CM | POA: Insufficient documentation

## 2011-02-06 DIAGNOSIS — Z87891 Personal history of nicotine dependence: Secondary | ICD-10-CM | POA: Insufficient documentation

## 2011-02-06 DIAGNOSIS — I6529 Occlusion and stenosis of unspecified carotid artery: Secondary | ICD-10-CM | POA: Insufficient documentation

## 2011-02-06 DIAGNOSIS — K219 Gastro-esophageal reflux disease without esophagitis: Secondary | ICD-10-CM | POA: Insufficient documentation

## 2011-02-06 DIAGNOSIS — G4730 Sleep apnea, unspecified: Secondary | ICD-10-CM | POA: Insufficient documentation

## 2011-02-06 DIAGNOSIS — I10 Essential (primary) hypertension: Secondary | ICD-10-CM | POA: Insufficient documentation

## 2011-02-06 HISTORY — PX: OTHER SURGICAL HISTORY: SHX169

## 2011-02-07 LAB — CALCIUM: Calcium: 9.4 mg/dL (ref 8.4–10.5)

## 2011-02-21 NOTE — Discharge Summary (Signed)
  NAMEMICHAL, [name] [name] NO.:  [id number]  MEDICAL RECORD NO.:  [id number]  LOCATION:  5157                         FACILITY:  [facility]  PHYSICIAN:  Velora Heckler, MD      DATE OF BIRTH:  09-28-1940  DATE OF ADMISSION:  02/06/2011 DATE OF DISCHARGE:  02/07/2011                              DISCHARGE SUMMARY   REASON FOR ADMISSION:  Thyroid nodule with cytologic atypia.  BRIEF HISTORY:  The patient is a 70 year old white male from [place], [place].  He was found incidentally on MRI scan to have a right- sided thyroid mass.  He underwent fine-needle aspiration biopsy, which showed significant cytologic atypia.  The patient now comes to surgery for resection for definitive diagnosis.  HOSPITAL COURSE:  The patient was admitted on the day of surgery.  He was taken to the operating room where he underwent total thyroidectomy without complication.  Postoperative course was straightforward.  Serum calcium level was 8.5 on the evening of surgery.  On the first postoperative day, the patient was doing well.  He tolerated a diet.  He had limited pain.  He was prepared for discharge.  DISCHARGE PLAN:  The patient is discharged home on February 07, 2011, in good condition, tolerating a regular diet, and ambulating independently. The patient will be seen back in my office at [facility] in 2-3 weeks.  DISCHARGE MEDICATIONS:  Vicodin as needed for pain and Synthroid 100 mcg daily.  The patient will also take calcium supplements 3 times daily.  FINAL DIAGNOSIS:  Right thyroid nodule with cytologic atypia, final pathologic results pending at the time of discharge.  CONDITION AT DISCHARGE:  Good.     Velora Heckler, MD     TMG/MEDQ  D:  02/07/2011  T:  02/07/2011  Job:  [id number]  cc:   Catalina Pizza, M.D.  Electronically Signed by Darnell Level MD on 02/21/2011 10:54:31 AM

## 2011-02-21 NOTE — Op Note (Signed)
[name] [name], ESQUIVIAS NO.:  [id number]  MEDICAL RECORD NO.:  [id number]  LOCATION:  5157                         FACILITY:  [facility]  PHYSICIAN:  Velora Heckler, MD      DATE OF BIRTH:  [birth year]  DATE OF PROCEDURE:  02/06/2011                               OPERATIVE REPORT   PREOPERATIVE DIAGNOSIS:  Thyroid nodules with cytologic atypia.  POSTOPERATIVE DIAGNOSIS:  Thyroid nodules with cytologic atypia.  PROCEDURE:  Total thyroidectomy.  SURGEON:  Velora Heckler, MD, FACS  ANESTHESIA:  General per, Kaylyn Layer. Michelle Piper, MD  ESTIMATED BLOOD LOSS:  Minimal.  PREPARATION:  ChloraPrep.  COMPLICATIONS:  None.  INDICATIONS:  The patient is a 70 year old white male from [facility].  The patient had an MRI scan as part of a neurological evaluation for a syncopal episode.  He was found to have a right-sided thyroid mass, is an incidental finding.  Ultrasound was performed in January 2012, and showed a dominant nodule in the right thyroid lobe which was hyperechoic with calcifications.  The patient underwent ultrasound-guided fine-needle aspiration biopsy which showed a follicular lesion.  There was cytologic atypia with nuclear overlap and nuclear grooves.  The patient was referred to Surgery for evaluation and management.  BODY OF REPORT:  Procedure was done in OR #12 at the [facility].  The patient was brought to the operating room, placed in a supine position on the operating room table.  Following administration of general anesthesia, the patient is positioned and then prepped and draped in the usual strict aseptic fashion.  After ascertaining that an adequate level of anesthesia been achieved, a Kocher incision was made with #15 blade.  Dissection was carried through subcutaneous tissues and platysma.  Hemostasis was obtained with the electrocautery.  Skin flaps were elevated cephalad and caudad from the thyroid notch to the  sternal notch.  A Mahorner self-retaining retractor was placed for exposure. Strap muscles were incised in the midline.  Dissection was begun on the right side.  Strap muscles were reflected laterally exposing an enlarged right thyroid lobe.  There is a dominant mass occupying the upper pole and posterior portion of the right lobe.  Venous tributaries were divided between Ligaclips  with the harmonic scalpel.  Superior pole vessels were dissected out and divided individually between small and medium Ligaclips with the harmonic scalpel.  Gland was rolled anteriorly.  Branches of the inferior thyroid artery were divided between small Ligaclips with the harmonic scalpel.  Care was taken to avoid the recurrent nerve and parathyroid tissue.  Inferior pole was mobilized and venous tributaries were divided between Ligaclips with the harmonic scalpel.  Gland was rolled onto the anterior trachea and the ligament of Allyson Sabal was released with the electrocautery and the isthmus was mobilized to the midline.  Next, we turned our attention to the left thyroid lobe.  Left lobe was largely normal in size without any obvious nodularity.  Strap muscles were again reflected laterally.  Venous tributaries were divided between Ligaclips with the harmonic scalpel.  Superior pole vessels were dissected out and divided between Ligaclips with the harmonic scalpel. Inferior venous tributaries were divided  with the harmonic scalpel. Gland was rolled anteriorly.  Branches of the inferior thyroid artery divided between small Ligaclips.  Ligament of Allyson Sabal was released with the electrocautery and the gland was rolled up and onto the anterior trachea.  There was a moderate-sized pyramidal lobe which extends to the thyroid notch.  This was resected with the electrocautery used for hemostasis.  The entire thyroid gland was then excised off the anterior trachea.  A suture was used to mark the right superior pole.  The  entire gland was submitted to pathology for review.  Neck was irrigated with warm saline which was evacuated.  Good hemostasis was noted.  Surgicel was placed in the operative field bilaterally.  Strap muscles were reapproximated in the midline with interrupted 3-0 Vicryl sutures.  Platysma was closed with interrupted 3- 0 Vicryl sutures.  Skin was closed with running 4-0 Monocryl subcuticular suture.  Wound was washed and dried and Benzoin and Steri- Strips were applied.  Sterile dressings were applied.  The patient was awakened from anesthesia and brought to the recovery room.  The patient tolerated the procedure well.   Velora Heckler, MD, FACS     TMG/MEDQ  D:  02/06/2011  T:  02/07/2011  Job:  [id number]  cc:   Catalina Pizza, M.D.  Electronically Signed by Darnell Level MD on 02/21/2011 10:55:08 AM

## 2011-02-22 ENCOUNTER — Encounter (INDEPENDENT_AMBULATORY_CARE_PROVIDER_SITE_OTHER): Payer: Self-pay | Admitting: Surgery

## 2011-02-22 ENCOUNTER — Ambulatory Visit (INDEPENDENT_AMBULATORY_CARE_PROVIDER_SITE_OTHER): Payer: Medicare HMO | Admitting: Surgery

## 2011-02-22 VITALS — BP 130/70 | HR 100 | Temp 97.0°F | Wt 207.0 lb

## 2011-02-22 DIAGNOSIS — E041 Nontoxic single thyroid nodule: Secondary | ICD-10-CM

## 2011-02-22 NOTE — Progress Notes (Signed)
HISTORY: The patient is a 70 year old white male who underwent total thyroidectomy on February 06, 2011 for a thyroid mass. 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 No evidence of malignancy was found. Patient returns today for his first postoperative visit. He is taking Synthroid 100 mcg daily.   PERTINENT REVIEW OF SYSTEMS: Patient has had mild neck discomfort. Voice quality has been normal. He has had no dysphagia. He has had mild fatigue.   EXAM: Neck incision is healing nicely. Minimal soft tissue swelling. Voice quality is normal. No sign of infection.   IMPRESSION: Status post total thyroidectomy for mixed microfollicular and macro follicular adenoma, 3.8 cm, with no evidence of malignancy   PLAN: Patient is doing well postoperatively. Final pathologic results were benign. He will continue on Synthroid 100 mcg daily. We will check a TSH level before his next office visit. He will begin applying topical creams to his incision. He will return to see me for a final wound check in 6 weeks.

## 2011-02-27 ENCOUNTER — Encounter (INDEPENDENT_AMBULATORY_CARE_PROVIDER_SITE_OTHER): Payer: Medicare HMO | Admitting: Surgery

## 2011-04-06 ENCOUNTER — Other Ambulatory Visit (INDEPENDENT_AMBULATORY_CARE_PROVIDER_SITE_OTHER): Payer: Self-pay | Admitting: Surgery

## 2011-04-11 ENCOUNTER — Encounter (INDEPENDENT_AMBULATORY_CARE_PROVIDER_SITE_OTHER): Payer: Self-pay | Admitting: Surgery

## 2011-04-11 ENCOUNTER — Ambulatory Visit (INDEPENDENT_AMBULATORY_CARE_PROVIDER_SITE_OTHER): Payer: Medicare HMO | Admitting: Surgery

## 2011-04-11 VITALS — BP 122/74 | HR 80 | Temp 97.0°F

## 2011-04-11 DIAGNOSIS — E041 Nontoxic single thyroid nodule: Secondary | ICD-10-CM

## 2011-04-11 MED ORDER — SYNTHROID 125 MCG PO TABS: 125.0000 ug | ORAL_TABLET | Freq: Every day | ORAL | Status: DC

## 2011-04-11 NOTE — Patient Instructions (Signed)
  COCOA BUTTER & VITAMIN E CREAM  ([employer]mer[employer] or other brand)  Apply cocoa butter/vitamin E cream to your incision 2 - 3 times daily.  Massage cream into incision for one minute with each application.  Use sunscreen (50 SPF or higher) for first 6 months after surgery.  You may substitute Mederma or other scar reducing creams as desired.   

## 2011-04-11 NOTE — Progress Notes (Signed)
Visit Diagnoses: 1. Thyroid nodule, uninodular     HISTORY: Patient returns for second postoperative visit having undergone total thyroidectomy with benign final pathology. Patient is currently taking Synthroid 100 mcg daily. He continues to note fatigue. He notes occasional voice or weakness. TSH level was checked and measured at 4.0.   EXAM: Surgical wound is well healed with good cosmetic result. Voice quality is normal at conversational level. No sign of seroma. No sign of infection.   IMPRESSION: Status post total thyroidectomy for benign nodule   PLAN: I am going to increase the patient's Synthroid dosage to 125 mcg daily. He will start this immediately. We will check a TSH level in 6 weeks. Patient will return for a final followup visit in 3 months.    Velora Heckler, MD, FACS General & Endocrine Surgery [facility]

## 2011-06-30 HISTORY — PX: FOOT SURGERY: SHX648

## 2011-07-11 ENCOUNTER — Telehealth (INDEPENDENT_AMBULATORY_CARE_PROVIDER_SITE_OTHER): Payer: Self-pay

## 2011-07-11 ENCOUNTER — Other Ambulatory Visit (INDEPENDENT_AMBULATORY_CARE_PROVIDER_SITE_OTHER): Payer: Self-pay | Admitting: Surgery

## 2011-07-11 NOTE — Telephone Encounter (Signed)
Patient's wife call about time frame for blood work. Message left for patient to have blood drawn today and keep appointment for 07/13/2011.

## 2011-07-13 ENCOUNTER — Encounter (INDEPENDENT_AMBULATORY_CARE_PROVIDER_SITE_OTHER): Payer: Self-pay | Admitting: Surgery

## 2011-07-13 ENCOUNTER — Ambulatory Visit (INDEPENDENT_AMBULATORY_CARE_PROVIDER_SITE_OTHER): Payer: Medicare HMO | Admitting: Surgery

## 2011-07-13 DIAGNOSIS — E890 Postprocedural hypothyroidism: Secondary | ICD-10-CM

## 2011-07-13 DIAGNOSIS — E041 Nontoxic single thyroid nodule: Secondary | ICD-10-CM

## 2011-07-13 NOTE — Progress Notes (Signed)
Visit Diagnoses: 1. Thyroid nodule, uninodular   2. Hypothyroidism, postsurgical     HISTORY: Patient is a 69 year old white male who underwent total thyroidectomy with benign final pathology. He is currently taking Synthroid 125 mcg daily. His TSH level is normal at 1.554.  Patient has no specific complaints related to his thyroid surgery. He does note some minor voice changes after prolonged talking. He also has some problems with hearing loss and asked for a recommendation for evaluation.   PERTINENT REVIEW OF SYSTEMS: Denies palpitations. Denies tremors.   EXAM: HEENT: normocephalic; pupils equal and reactive; sclerae clear; dentition good; mucous membranes moist NECK:  No palpable masses or nodules; well-healed incision; symmetric on extension; no palpable anterior or posterior cervical lymphadenopathy; no supraclavicular masses; no tenderness CHEST: clear to auscultation bilaterally without rales, rhonchi, or wheezes CARDIAC: regular rate and rhythm without significant murmur; peripheral pulses are full EXT:  non-tender without edema; no deformity NEURO: no gross focal deficits; no sign of tremor   IMPRESSION: Status post total thyroidectomy with benign final pathologic results; surgical hypothyroidism   PLAN: The patient will remain on Synthroid 125 mcg daily. His primary care physician will monitor his TSH level and adjust the dose if necessary. My goal would be to keep the TSH level between 1.0 and 2.0.  I have recommended that the patient see Dr. Ermalinda Barrios for his hearing concerns. I have provided them with contact information.  Patient will return to see me as needed.   Velora Heckler, MD, FACS General & Endocrine Surgery [facility]

## 2011-10-04 ENCOUNTER — Encounter (INDEPENDENT_AMBULATORY_CARE_PROVIDER_SITE_OTHER): Payer: Self-pay | Admitting: *Deleted

## 2011-12-04 ENCOUNTER — Encounter (INDEPENDENT_AMBULATORY_CARE_PROVIDER_SITE_OTHER): Payer: Self-pay | Admitting: Internal Medicine

## 2011-12-04 ENCOUNTER — Telehealth (INDEPENDENT_AMBULATORY_CARE_PROVIDER_SITE_OTHER): Payer: Self-pay | Admitting: *Deleted

## 2011-12-04 ENCOUNTER — Ambulatory Visit (INDEPENDENT_AMBULATORY_CARE_PROVIDER_SITE_OTHER): Payer: Medicare Other | Admitting: Internal Medicine

## 2011-12-04 ENCOUNTER — Other Ambulatory Visit (INDEPENDENT_AMBULATORY_CARE_PROVIDER_SITE_OTHER): Payer: Self-pay | Admitting: *Deleted

## 2011-12-04 VITALS — BP 130/90 | HR 82 | Temp 97.4°F | Resp 20 | Ht 74.0 in | Wt 215.6 lb

## 2011-12-04 DIAGNOSIS — I10 Essential (primary) hypertension: Secondary | ICD-10-CM | POA: Insufficient documentation

## 2011-12-04 DIAGNOSIS — K625 Hemorrhage of anus and rectum: Secondary | ICD-10-CM

## 2011-12-04 DIAGNOSIS — K219 Gastro-esophageal reflux disease without esophagitis: Secondary | ICD-10-CM | POA: Insufficient documentation

## 2011-12-04 DIAGNOSIS — I2510 Atherosclerotic heart disease of native coronary artery without angina pectoris: Secondary | ICD-10-CM

## 2011-12-04 DIAGNOSIS — E785 Hyperlipidemia, unspecified: Secondary | ICD-10-CM | POA: Insufficient documentation

## 2011-12-04 HISTORY — DX: Atherosclerotic heart disease of native coronary artery without angina pectoris: I25.10

## 2011-12-04 MED ORDER — PEG-KCL-NACL-NASULF-NA ASC-C 100 G PO SOLR: 1.0000 | Freq: Once | ORAL | Status: DC

## 2011-12-04 NOTE — Telephone Encounter (Signed)
Patient needs movi prep 

## 2011-12-04 NOTE — Patient Instructions (Signed)
Colonoscopy to be scheduled. 

## 2011-12-06 NOTE — Consult Note (Signed)
NAMESKY, BORBOA NO.:  [id number]  MEDICAL RECORD NO.:  [id number]  LOCATION:                                 FACILITY:  PHYSICIAN:  Lionel December, M.D.    DATE OF BIRTH:  [birth year]  DATE OF CONSULTATION:  12/04/2011 DATE OF DISCHARGE:                                CONSULTATION   PRESENTING COMPLAINT:  Recent bout with rectal bleeding.  HISTORY OF PRESENT ILLNESS:  [name] [name] is 71 year old Caucasian male who is referred through courtesy of Dr. Dwana Melena for GI evaluation.  He drove [place] to [place] with his wife.  Soon after arrival, he developed diarrhea and the following day, he had frank rectal bleeding.  He remembers he had abdominal cramps.  He was seen in emergency room and evaluated and told he had colitis.  His symptoms resolved over the next couple of days.  He states he is back to having normal bowel movements. He denies melena.  He has very good appetite.  He has gained few pounds over the last year.  His heartburn is well controlled with therapy and he denies dysphagia.  His last colonoscopy was over 15 years ago.  He has been on diclofenac for the last couple of years, but has not experienced any side effects.  CURRENT MEDICATIONS: 1. Amlodipine/valsartan 10/320 p.o. daily. 2. Aspirin 81 mg p.o. daily. 3. Calcium carbonate 600 mg p.o. daily. 4. Cholecalciferol 1000 units p.o. daily. 5. CoQ10 100 mg p.o. daily. 6. Diclofenac 75 mg p.o. b.i.d. 7. Folic acid 450 mcg p.o. daily. 8. Metoprolol 100 mg p.o. daily. 9. Osteo Bi-Flex 1 tablet p.o. b.i.d. 10.MVI p.o. daily. 11.Niacin 500 mg p.o. daily. 12.Omega-3 fatty acids 1 g p.o. b.i.d. 13.Omeprazole 20 mg p.o. q.a.m. 14.Metamucil 3 capsules p.o. daily. 15.Simvastatin 40 mg p.o. daily. 16.Synthroid 125 mcg p.o. daily.  PAST MEDICAL HISTORY: 1. Hypertension of 30-40 years duration. 2. He has had symptoms of GERD for more than 10 years and has had EGD     in the past. 3. Hyperlipidemia of  several years duration. 4. He had MI in 1977.  He had a single stent placed at that time and     has not had any problems since then. 5. He had syncopal episode in August 2011.  He was seen by his     cardiologist and subsequently by neurologist.  While no cause was     found for his syncope, thyroid nodules or masses were noted.  He     also had sleep study which was abnormal and has been using CPAP. 6. Several years ago, he had some mucosal resection for deflected     nasal septum. 7. He had thyroid surgery in June 2012, it turned out to be large     macrofollicular adenoma, now he is on replacement therapy. 8. He had right MCP joint replacement in July 2012. 9. Patient's last colonoscopy was over 15 years ago.  ALLERGIES:  NK.  FAMILY HISTORY:  Father died at age 47 of old age, last year.  Mother had CAD and CVA and died at 66.  The patient has one brother and sister in good health.  SOCIAL HISTORY:  He is married, 1 child in good health.  He is retired from [employer] where he worked for 30 years.  He started to smoke when he was 71 years old and smoked 1 or 2 cigarettes a day and by the time, he quit in 2003, he was smoking half a pack a day.  He used to drink alcohol occasionally, but none in the last few years.  PHYSICAL EXAMINATION:  VITAL SIGNS:  Weight 215.6 pounds, he is 74 inches tall, pulse 82 per minute, blood pressure 130/90, respirations 20.  HEENT:  Conjunctivae is pink.  Sclera is nonicteric.  Oropharyngeal mucosa is normal.  Dentition in satisfactory condition with some fillings, etc.  No neck masses or thyromegaly noted.  He has a faint right carotid bruit (known). CARDIAC:  With regular rhythm.  Normal S1, S2.  No murmur or gallop noted. LUNGS:  Clear to auscultation. ABDOMEN:  Symmetrical and soft.  Bowel sounds are normal.  No bruits noted.  Palpation reveals soft abdomen without tenderness, organomegaly, or masses. RECTAL:  Deferred.  ASSESSMENT:   [name] [name] is 71 year old Caucasian male, who had suffered an episode of rectal bleeding, proceeded by 1 day of diarrhea and abdominal cramps.  He did not experience fever or other constitutional symptoms. It appears he has fully recovered.  I suspect he had self-limiting bout of colitis.  Patient's last colonoscopy was over 15 years ago and he is therefore due for one.  RECOMMENDATIONS: 1. Colonoscopy, both for diagnostic and screening purposes in near     future. 2. I have also asked patient to consider dropping diclofenac dose to     perhaps 1 a day which he can start after he has had colonoscopy.  We appreciate the opportunity to participate in the care of this gentleman.          ______________________________ Lionel December, M.D.     NR/MEDQ  D:  12/04/2011  T:  12/04/2011  Job:  [id number]

## 2011-12-08 NOTE — Progress Notes (Signed)
PRESENTING COMPLAINT: Recent bout with rectal bleeding.  HISTORY OF PRESENT ILLNESS: Maurine Minister is 71 year old Caucasian male who is  referred through courtesy of Dr. Dwana Melena for GI evaluation. He drove  [place] to [place] with his wife. Soon after arrival, he developed  diarrhea and the following day, he had frank rectal bleeding. He  remembers he had abdominal cramps. He was seen in emergency room and  evaluated and told he had colitis. His symptoms resolved over the next  couple of days. He states he is back to having normal bowel movements.  He denies melena. He has very good appetite. He has gained few pounds  over the last year. His heartburn is well controlled with therapy and  he denies dysphagia. His last colonoscopy was over 15 years ago. He  has been on diclofenac for the last couple of years, but has not  experienced any side effects.  CURRENT MEDICATIONS:  1. Amlodipine/valsartan 10/320 p.o. daily.  2. Aspirin 81 mg p.o. daily.  3. Calcium carbonate 600 mg p.o. daily.  4. Cholecalciferol 1000 units p.o. daily.  5. CoQ10 100 mg p.o. daily.  6. Diclofenac 75 mg p.o. b.i.d.  7. Folic acid 450 mcg p.o. daily.  8. Metoprolol 100 mg p.o. daily.  9. Osteo Bi-Flex 1 tablet p.o. b.i.d.  10.MVI p.o. daily.  11.Niacin 500 mg p.o. daily.  12.Omega-3 fatty acids 1 g p.o. b.i.d.  13.Omeprazole 20 mg p.o. q.a.m.  14.Metamucil 3 capsules p.o. daily.  15.Simvastatin 40 mg p.o. daily.  16.Synthroid 125 mcg p.o. daily.  PAST MEDICAL HISTORY:  1. Hypertension of 30-40 years duration.  2. He has had symptoms of GERD for more than 10 years and has had EGD  in the past.  3. Hyperlipidemia of several years duration.  4. He had MI in 1977. He had a single stent placed at that time and  has not had any problems since then.  5. He had syncopal episode in August 2011. He was seen by his  cardiologist and subsequently by neurologist. While no cause was  found for his syncope, thyroid nodules or  masses were noted. He  also had sleep study which was abnormal and has been using CPAP.  6. Several years ago, he had some mucosal resection for deflected  nasal septum.  7. He had thyroid surgery in June 2012, it turned out to be large  macrofollicular adenoma, now he is on replacement therapy.  8. He had right MCP joint replacement in July 2012.  9. Patient's last colonoscopy was over 15 years ago.  ALLERGIES: NK.  FAMILY HISTORY: Father died at age 51 of old age, last year. Mother  had CAD and CVA and died at 46. The patient has one brother and sister  in good health.  SOCIAL HISTORY: He is married, 1 child in good health. He is retired  from [employer] where he worked for 30 years. He started to smoke  when he was 71 years old and smoked 1 or 2 cigarettes a day and by the  time, he quit in 2003, he was smoking half a pack a day. He used to  drink alcohol occasionally, but none in the last few years.  PHYSICAL EXAMINATION: VITAL SIGNS: Weight 215.6 pounds, he is 69  inches tall, pulse 82 per minute, blood pressure 130/90, respirations  20.  HEENT: Conjunctivae is pink. Sclera is nonicteric. Oropharyngeal  mucosa is normal. Dentition in satisfactory condition with some  fillings, etc. No neck masses or  thyromegaly noted. He has a faint  right carotid bruit (known).  CARDIAC: With regular rhythm. Normal S1, S2. No murmur or gallop  noted.  LUNGS: Clear to auscultation.  ABDOMEN: Symmetrical and soft. Bowel sounds are normal. No bruits  noted. Palpation reveals soft abdomen without tenderness, organomegaly,  or masses.  RECTAL: Deferred.  ASSESSMENT: Maurine Minister is 71 year old Caucasian male, who had suffered an  episode of rectal bleeding, proceeded by 1 day of diarrhea and abdominal  cramps. He did not experience fever or other constitutional symptoms.  It appears he has fully recovered. I suspect he had self-limiting bout  of colitis. Patient's last colonoscopy was over 15 years  ago and he is  therefore due for one.  RECOMMENDATIONS:  1. Colonoscopy, both for diagnostic and screening purposes in near  future.  2. I have also asked patient to consider dropping diclofenac dose to  perhaps 1 a day which he can start after he has had colonoscopy.  We appreciate the opportunity to participate in the care of this  gentleman.

## 2011-12-11 ENCOUNTER — Encounter (HOSPITAL_COMMUNITY): Payer: Self-pay | Admitting: *Deleted

## 2011-12-11 ENCOUNTER — Ambulatory Visit (HOSPITAL_COMMUNITY)
Admission: RE | Admit: 2011-12-11 | Discharge: 2011-12-11 | Disposition: A | Discharge status: Home / Self Care | Payer: Medicare Other | Source: Ambulatory Visit | Attending: Internal Medicine | Admitting: Internal Medicine

## 2011-12-11 ENCOUNTER — Encounter (HOSPITAL_COMMUNITY)
Admission: RE | Disposition: A | Discharge status: Home / Self Care | Payer: Self-pay | Source: Ambulatory Visit | Attending: Internal Medicine

## 2011-12-11 DIAGNOSIS — E785 Hyperlipidemia, unspecified: Secondary | ICD-10-CM | POA: Insufficient documentation

## 2011-12-11 DIAGNOSIS — Z8719 Personal history of other diseases of the digestive system: Secondary | ICD-10-CM

## 2011-12-11 DIAGNOSIS — I10 Essential (primary) hypertension: Secondary | ICD-10-CM | POA: Insufficient documentation

## 2011-12-11 DIAGNOSIS — K6389 Other specified diseases of intestine: Secondary | ICD-10-CM

## 2011-12-11 DIAGNOSIS — K5730 Diverticulosis of large intestine without perforation or abscess without bleeding: Secondary | ICD-10-CM

## 2011-12-11 DIAGNOSIS — K644 Residual hemorrhoidal skin tags: Secondary | ICD-10-CM | POA: Insufficient documentation

## 2011-12-11 DIAGNOSIS — R197 Diarrhea, unspecified: Secondary | ICD-10-CM

## 2011-12-11 DIAGNOSIS — Z79899 Other long term (current) drug therapy: Secondary | ICD-10-CM | POA: Insufficient documentation

## 2011-12-11 DIAGNOSIS — Z1211 Encounter for screening for malignant neoplasm of colon: Secondary | ICD-10-CM | POA: Insufficient documentation

## 2011-12-11 DIAGNOSIS — K625 Hemorrhage of anus and rectum: Secondary | ICD-10-CM

## 2011-12-11 HISTORY — PX: COLONOSCOPY: SHX5424

## 2011-12-11 SURGERY — COLONOSCOPY
Anesthesia: Moderate Sedation

## 2011-12-11 MED ORDER — STERILE WATER FOR IRRIGATION IR SOLN
Status: DC | PRN
  Administered 2011-12-11: 08:00:00

## 2011-12-11 MED ORDER — MEPERIDINE HCL 50 MG/ML IJ SOLN
INTRAMUSCULAR | Status: AC
  Filled 2011-12-11: qty 1

## 2011-12-11 MED ORDER — MIDAZOLAM HCL 5 MG/5ML IJ SOLN
INTRAMUSCULAR | Status: DC | PRN
  Administered 2011-12-11: 2 mg via INTRAVENOUS
  Administered 2011-12-11: 3 mg via INTRAVENOUS

## 2011-12-11 MED ORDER — MEPERIDINE HCL 50 MG/ML IJ SOLN
INTRAMUSCULAR | Status: DC | PRN
  Administered 2011-12-11 (×2): 25 mg via INTRAVENOUS

## 2011-12-11 MED ORDER — SODIUM CHLORIDE 0.45 % IV SOLN
Freq: Once | INTRAVENOUS | Status: AC
Start: 2011-12-11 — End: 2011-12-11
  Administered 2011-12-11: 08:00:00 via INTRAVENOUS

## 2011-12-11 MED ORDER — MIDAZOLAM HCL 5 MG/5ML IJ SOLN
INTRAMUSCULAR | Status: AC
  Filled 2011-12-11: qty 10

## 2011-12-11 NOTE — Op Note (Signed)
COLONOSCOPY PROCEDURE REPORT  PATIENT:  [name] [name]  MR#:  [id number] Birthdate:  June 20, 1941, 71 y.o., male Endoscopist:  Dr. Malissa Hippo, MD Referred By:  Dr. Dwana Melena, MD Procedure Date: 12/11/2011  Procedure:   Colonoscopy  Indications:  Patient is 71 year old Caucasian male suffered an episode of diarrhea and rectal bleeding quad vacationing in [place]. He has fully recovered. He is undergoing colonoscopy for for diagnostic and screening purposes.  Informed Consent:  The procedure and risks were reviewed with the patient and informed consent was obtained.  Medications:  Demerol 50 mg IV Versed 5 mg IV  Description of procedure:  After a digital rectal exam was performed, that colonoscope was advanced from the anus through the rectum and colon to the area of the cecum, ileocecal valve and appendiceal orifice. The cecum was deeply intubated. These structures were well-seen and photographed for the record. From the level of the cecum and ileocecal valve, the scope was slowly and cautiously withdrawn. The mucosal surfaces were carefully surveyed utilizing scope tip to flexion to facilitate fold flattening as needed. The scope was pulled down into the rectum where a thorough exam including retroflexion was performed.  Findings:   Prep excellent. Two erosions; noted one at transverse colon and the second one sigmoid colon. Few diverticula sigmoid colon. Normal rectal mucosa. Small hemorrhoids below the dentate line.   Therapeutic/Diagnostic Maneuvers Performed:  None  Complications:  None  Cecal Withdrawal Time:  10 minutes  Impression:  Examination performed to cecum. Two erosions possibly residual from recent bout of colitis. Mild sigmoid colon diverticulosis and external hemorrhoids.  Recommendations:  Standard instructions given. Next screening exam in 10 years  Shamar Engelmann U  12/11/2011 8:54 AM  CC: Dr. Dwana Melena, MD, MD & Dr. Bonnetta Barry ref. provider  found

## 2011-12-11 NOTE — Discharge Instructions (Signed)
Resume usual medications. High fiber diet. No driving for 24 hours. Diverticulosis Diverticulosis is a common condition that develops when small pouches (diverticula) form in the wall of the colon. The risk of diverticulosis increases with age. It happens more often in people who eat a low-fiber diet. Most individuals with diverticulosis have no symptoms. Those individuals with symptoms usually experience abdominal pain, constipation, or loose stools (diarrhea). HOME CARE INSTRUCTIONS   Increase the amount of fiber in your diet as directed by your caregiver or dietician. This may reduce symptoms of diverticulosis.   Your caregiver may recommend taking a dietary fiber supplement.   Drink at least 6 to 8 glasses of water each day to prevent constipation.   Try not to strain when you have a bowel movement.   Your caregiver may recommend avoiding nuts and seeds to prevent complications, although this is still an uncertain benefit.   Only take over-the-counter or prescription medicines for pain, discomfort, or fever as directed by your caregiver.  FOODS WITH HIGH FIBER CONTENT INCLUDE:  Fruits. Apple, peach, pear, tangerine, raisins, prunes.   Vegetables. Brussels sprouts, asparagus, broccoli, cabbage, carrot, cauliflower, romaine lettuce, spinach, summer squash, tomato, winter squash, zucchini.   Starchy Vegetables. Baked beans, kidney beans, lima beans, split peas, lentils, potatoes (with skin).   Grains. Whole wheat bread, brown rice, bran flake cereal, plain oatmeal, white rice, shredded wheat, bran muffins.  SEEK IMMEDIATE MEDICAL CARE IF:   You develop increasing pain or severe bloating.   You have an oral temperature above 102 F (38.9 C), not controlled by medicine.   You develop vomiting or bowel movements that are bloody or black.  Document Released: 04/27/2004 Document Revised: 07/20/2011 Document Reviewed: 12/29/2009 [facility] Patient Information 2012 [place], [place].

## 2011-12-11 NOTE — H&P (Signed)
[name] [name] is an 71 y.o. male.   Chief Complaint: Patient is here for colonoscopy. HPI: Patient is 70 year old Caucasian male who suffered an episode of rectal bleeding 2 months ago while he was in [place]. This episode occurred in the setting of diarrhea. He presumed he had self-limiting bouts of colitis. He is undergoing colonoscopy both for diagnostic and screening purposes. Details of history summarized in my note from 12/04/2011. He is presently not having any abdominal pain diarrhea constipation or rectal bleeding.  Past Medical History  Diagnosis Date  . Hypertension   . Hyperlipidemia   . CAD (coronary artery disease)   . Sleep apnea   . Heart attack   . Wears glasses     or contacts  . Deviated septum   . Thyroid nodule   . GERD (gastroesophageal reflux disease)     Past Surgical History  Procedure Date  . Coronary stent placement   . Nasal septum surgery   . Totol thyroidectomy 02/06/2011  . Foot surgery 06/30/11    artificial joint in big toe right foot   . Colonoscopy 15-20years    Done at [facility]  . Upper gastrointestinal endoscopy 35-40 years ago    [facility]    Family History  Problem Relation Age of Onset  . Hypertension Mother   . Healthy Sister   . Healthy Brother   . Healthy Son   . Colon cancer Neg Hx    Social History:  reports that he quit smoking about 10 years ago. His smoking use included Cigarettes. He has never used smokeless tobacco. He reports that he drinks alcohol. He reports that he does not use illicit drugs.  Allergies: No Known Allergies  Medications Prior to Admission  Medication Sig Dispense Refill  . amLODipine-valsartan (EXFORGE) 10-320 MG per tablet Take 1 tablet by mouth daily.      . metoprolol (TOPROL-XL) 100 MG 24 hr tablet Take 100 mg by mouth daily.        . peg 3350 powder (MOVIPREP) SOLR Take 1 kit (100 g total) by mouth once.  1 kit  0  . SYNTHROID 125 MCG tablet Take 1 tablet (125 mcg total) by mouth  daily.  30 tablet  6  . aspirin 81 MG tablet Take 81 mg by mouth daily.        . calcium carbonate (OS-CAL) 600 MG TABS Take 600 mg by mouth daily.      . Cholecalciferol (VITAMIN D3) 1000 UNITS CAPS Take by mouth.      . co-enzyme Q-10 30 MG capsule Take 100 mg by mouth daily.       . Diclofenac Sodium (VOLTAREN PO) Take 75 mg by mouth 2 (two) times daily.       Marland Kitchen FOLIC ACID PO Take 450 mcg by mouth.       . Misc Natural Products (OSTEO BI-FLEX ADV DOUBLE ST) TABS Take by mouth 2 (two) times daily.      . MULTIPLE VITAMINS PO Take by mouth daily.      . niacin 500 MG tablet Take 500 mg by mouth daily with breakfast.      . Omega-3 Fatty Acids (FISH OIL) 1000 MG CPDR Take by mouth 2 (two) times daily.      Marland Kitchen omeprazole (PRILOSEC) 10 MG capsule Take 20 mg by mouth.       . Psyllium (METAMUCIL PO) Take 3 capsules by mouth daily.       . simvastatin (ZOCOR)  40 MG tablet Take 40 mg by mouth every evening.        No results found for this or any previous visit (from the past 48 hour(s)). No results found.  ROS  Blood pressure 120/76, pulse 59, temperature 97.6 F (36.4 C), temperature source Oral, resp. rate 20, height 6\' 2"  (1.88 m), weight 215 lb (97.523 kg), SpO2 93.00%. Physical Exam  Constitutional: He appears well-developed and well-nourished.  HENT:  Mouth/Throat: Oropharynx is clear and moist.  Eyes: Conjunctivae are normal.  Cardiovascular: Normal rate, regular rhythm and normal heart sounds.   No murmur heard. Respiratory: Effort normal and breath sounds normal.  GI: Soft. He exhibits no distension and no mass. There is no tenderness.  Musculoskeletal: He exhibits no edema.  Neurological: He is alert.  Skin: Skin is warm and dry.     Assessment/Plan Recent bout of bloody diarrhea/rectal bleeding. Colonoscopy.  Ori Trejos U 12/11/2011, 8:24 AM

## 2011-12-15 ENCOUNTER — Encounter (HOSPITAL_COMMUNITY): Payer: Self-pay | Admitting: Internal Medicine

## 2012-01-24 ENCOUNTER — Ambulatory Visit: Payer: Medicare Other | Admitting: Neurosurgery

## 2012-01-24 ENCOUNTER — Other Ambulatory Visit: Payer: Medicare Other

## 2012-02-06 ENCOUNTER — Encounter: Payer: Self-pay | Admitting: Neurosurgery

## 2012-02-07 ENCOUNTER — Encounter: Payer: Self-pay | Admitting: Neurosurgery

## 2012-02-07 ENCOUNTER — Encounter (INDEPENDENT_AMBULATORY_CARE_PROVIDER_SITE_OTHER): Payer: Self-pay | Admitting: General Surgery

## 2012-02-07 ENCOUNTER — Ambulatory Visit (INDEPENDENT_AMBULATORY_CARE_PROVIDER_SITE_OTHER): Payer: Medicare Other | Admitting: Vascular Surgery

## 2012-02-07 ENCOUNTER — Ambulatory Visit (INDEPENDENT_AMBULATORY_CARE_PROVIDER_SITE_OTHER): Payer: Medicare Other | Admitting: *Deleted

## 2012-02-07 VITALS — BP 136/80 | HR 61 | Resp 12 | Ht 74.0 in | Wt 213.6 lb

## 2012-02-07 DIAGNOSIS — I6529 Occlusion and stenosis of unspecified carotid artery: Secondary | ICD-10-CM

## 2012-02-07 NOTE — Assessment & Plan Note (Signed)
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 Given the severity of the stenosis I have recommended right carotid endarterectomy in order to lower his risk of future stroke. I have reviewed the indications for carotid endarterectomy, that is to lower the risk of future stroke. I have also reviewed the potential complications of surgery, including but not limited to: bleeding, stroke (perioperative risk 1-2%), MI, nerve injury of other unpredictable medical problems. All of the patients questions were answered and they are agreeable to proceed with surgery. Of note the patient is scheduled for a stress test in 2 weeks inside ask him to proceed with this test so that we can be sure that it is safe from a cardiac risk to proceed with surgery. Once the stress test is complete he will call to schedule surgery. He does know to continue his aspirin right up through surgery.

## 2012-02-07 NOTE — Progress Notes (Signed)
[facility]  Patient name: [name] [name] MRN: [id number] DOB: 1940-10-06 Sex: male  CC: follow up of carotid disease.  HPI: [name] [name] is a 71 y.o. male who I have been following with carotid disease. I last saw him in December 2011. He was then temporarily lost to follow up. He comes in for a routine visit. He denies any history of stroke, TIAs, expressive or receptive aphasia, or amaurosis fugax. Since I saw him last he has undergone a thyroidectomy by Dr. Darnell Level. He has done well from that standpoint.   He had a myocardial infarction some point in 2000 and has had no recent cardiac issues a. He is scheduled for a stress test and reducible 2 weeks from now.   Past Medical History  Diagnosis Date  . Hypertension   . Hyperlipidemia   . CAD (coronary artery disease)   . Sleep apnea   . Wears glasses     or contacts  . Deviated septum   . Thyroid nodule   . GERD (gastroesophageal reflux disease)   . Heart attack     Family History  Problem Relation Age of Onset  . Hypertension Mother   . Healthy Sister   . Healthy Brother   . Healthy Son   . Colon cancer Neg Hx     SOCIAL HISTORY: History  Substance Use Topics  . Smoking status: Former Smoker    Types: Cigarettes    Quit date: 12/03/2001  . Smokeless tobacco: Never Used   Comment: smoked 45yrs, smoked 1/2 pack a day  . Alcohol Use: Yes     social- beer , not on regular basis    No Known Allergies  Current Outpatient Prescriptions  Medication Sig Dispense Refill  . amLODipine-valsartan (EXFORGE) 10-320 MG per tablet Take 1 tablet by mouth daily.      Marland Kitchen aspirin 81 MG tablet Take 81 mg by mouth daily.        . calcium carbonate (OS-CAL) 600 MG TABS Take 600 mg by mouth daily.      . Cholecalciferol (VITAMIN D3) 1000 UNITS CAPS Take by mouth.      . co-enzyme Q-10 30 MG capsule Take 100 mg by mouth daily.       . Diclofenac Sodium (VOLTAREN PO) Take 75 mg by mouth 2 (two)  times daily.       Marland Kitchen FOLIC ACID PO Take 450 mcg by mouth.       . metoprolol (TOPROL-XL) 100 MG 24 hr tablet Take 100 mg by mouth daily.        . Misc Natural Products (OSTEO BI-FLEX ADV DOUBLE ST) TABS Take by mouth 2 (two) times daily.      . MULTIPLE VITAMINS PO Take by mouth daily.      . niacin 500 MG tablet Take 500 mg by mouth daily with breakfast.      . Omega-3 Fatty Acids (FISH OIL) 1000 MG CPDR Take by mouth 2 (two) times daily.      Marland Kitchen omeprazole (PRILOSEC) 10 MG capsule Take 20 mg by mouth.       . Psyllium (METAMUCIL PO) Take 3 capsules by mouth daily.       . simvastatin (ZOCOR) 40 MG tablet Take 40 mg by mouth every evening.      Marland Kitchen SYNTHROID 125 MCG tablet Take 1 tablet (125 mcg total) by mouth daily.  30 tablet  6    REVIEW OF SYSTEMS: Arly.[email or web address] ]  denotes positive finding; [  ] denotes negative finding CARDIOVASCULAR:  [ ]  chest pain   [ ]  chest pressure   [ ]  palpitations   [ ]  orthopnea   Arly.Keller ] dyspnea on exertion   [ ]  claudication   [ ]  rest pain   [ ]  DVT   [ ]  phlebitis PULMONARY:   [ ]  productive cough   [ ]  asthma   [ ]  wheezing NEUROLOGIC:   [ ]  weakness  [ ]  paresthesias  [ ]  aphasia  [ ]  amaurosis  [ ]  dizziness HEMATOLOGIC:   [ ]  bleeding problems   [ ]  clotting disorders MUSCULOSKELETAL:  [ ]  joint pain   [ ]  joint swelling [ ]  leg swelling GASTROINTESTINAL: [ ]   blood in stool  [ ]   hematemesis GENITOURINARY:  [ ]   dysuria  [ ]   hematuria PSYCHIATRIC:  [ ]  history of major depression INTEGUMENTARY:  [ ]  rashes  [ ]  ulcers CONSTITUTIONAL:  [ ]  fever   [ ]  chills  PHYSICAL EXAM: Filed Vitals:   02/07/12 1459 02/07/12 1500  BP: 126/78 136/80  Pulse: 60 61  Resp: 16 12  Height: 6\' 2"  (1.88 m)   Weight: 213 lb 9.6 oz (96.888 kg)   SpO2: 96%    Body mass index is 27.42 kg/(m^2). GENERAL: The patient is a well-nourished male, in no acute distress. The vital signs are documented above. CARDIOVASCULAR: There is a regular rate and rhythm without significant murmur  appreciated. He has a right carotid bruit. He has palpable femoral and pedal pulses bilaterally. PULMONARY: There is good air exchange bilaterally without wheezing or rales. ABDOMEN: Soft and non-tender with normal pitched bowel sounds. I do not appreciate an abdominal aortic aneurysm. MUSCULOSKELETAL: There are no major deformities or cyanosis. NEUROLOGIC: No focal weakness or paresthesias are detected. SKIN: There are no ulcers or rashes noted. PSYCHIATRIC: The patient has a normal affect.  DATA:  I have independently interpreted his carotid duplex scan which shows a greater than 80% right carotid stenosis with a 60-79% left carotid stenosis.   MEDICAL ISSUES:  Occlusion and stenosis of carotid artery without mention of cerebral infarction This patient has an asymptomatic greater than 80% right carotid stenosis. Given the severity of the stenosis I have recommended right carotid endarterectomy in order to lower his risk of future stroke. I have reviewed the indications for carotid endarterectomy, that is to lower the risk of future stroke. I have also reviewed the potential complications of surgery, including but not limited to: bleeding, stroke (perioperative risk 1-2%), MI, nerve injury of other unpredictable medical problems. All of the patients questions were answered and they are agreeable to proceed with surgery. Of note the patient is scheduled for a stress test in 2 weeks inside ask him to proceed with this test so that we can be sure that it is safe from a cardiac risk to proceed with surgery. Once the stress test is complete he will call to schedule surgery. He does know to continue his aspirin right up through surgery.    Calvert Charland S [facility] [place] Office: [phone]

## 2012-02-07 NOTE — Progress Notes (Unsigned)
Faxed prescription requested signed by Dr. Gerrit Friends to [facility] [phone]. Request was for Synthroid 125 mcg 30 tablet, take one daily. Dr. Gerrit Friends noted on request "patient should contact primary doctor for future refills". Request sent to medical records to be scanned into EMR.

## 2012-02-12 HISTORY — PX: CARDIOVASCULAR STRESS TEST: SHX262

## 2012-02-16 ENCOUNTER — Other Ambulatory Visit (HOSPITAL_COMMUNITY): Payer: Self-pay | Admitting: Cardiovascular Disease

## 2012-02-16 ENCOUNTER — Ambulatory Visit (HOSPITAL_COMMUNITY)
Admission: RE | Admit: 2012-02-16 | Discharge: 2012-02-16 | Disposition: A | Discharge status: Home / Self Care | Payer: Medicare Other | Source: Ambulatory Visit | Attending: Cardiovascular Disease | Admitting: Cardiovascular Disease

## 2012-02-16 ENCOUNTER — Other Ambulatory Visit: Payer: Self-pay | Admitting: Cardiovascular Disease

## 2012-02-16 DIAGNOSIS — Z87891 Personal history of nicotine dependence: Secondary | ICD-10-CM | POA: Insufficient documentation

## 2012-02-16 DIAGNOSIS — R0602 Shortness of breath: Secondary | ICD-10-CM | POA: Insufficient documentation

## 2012-02-16 DIAGNOSIS — Z01811 Encounter for preprocedural respiratory examination: Secondary | ICD-10-CM

## 2012-02-16 IMAGING — CR DG CHEST 2V
2 series · 2 of 2 positions shown · non-contrast
Comparison: [DATE]

CLINICAL DATA: Pre heart catheterization, shortness of breath
during stress test, former smoker

CHEST - 2 VIEW

[view not recorded (1 of 2)]
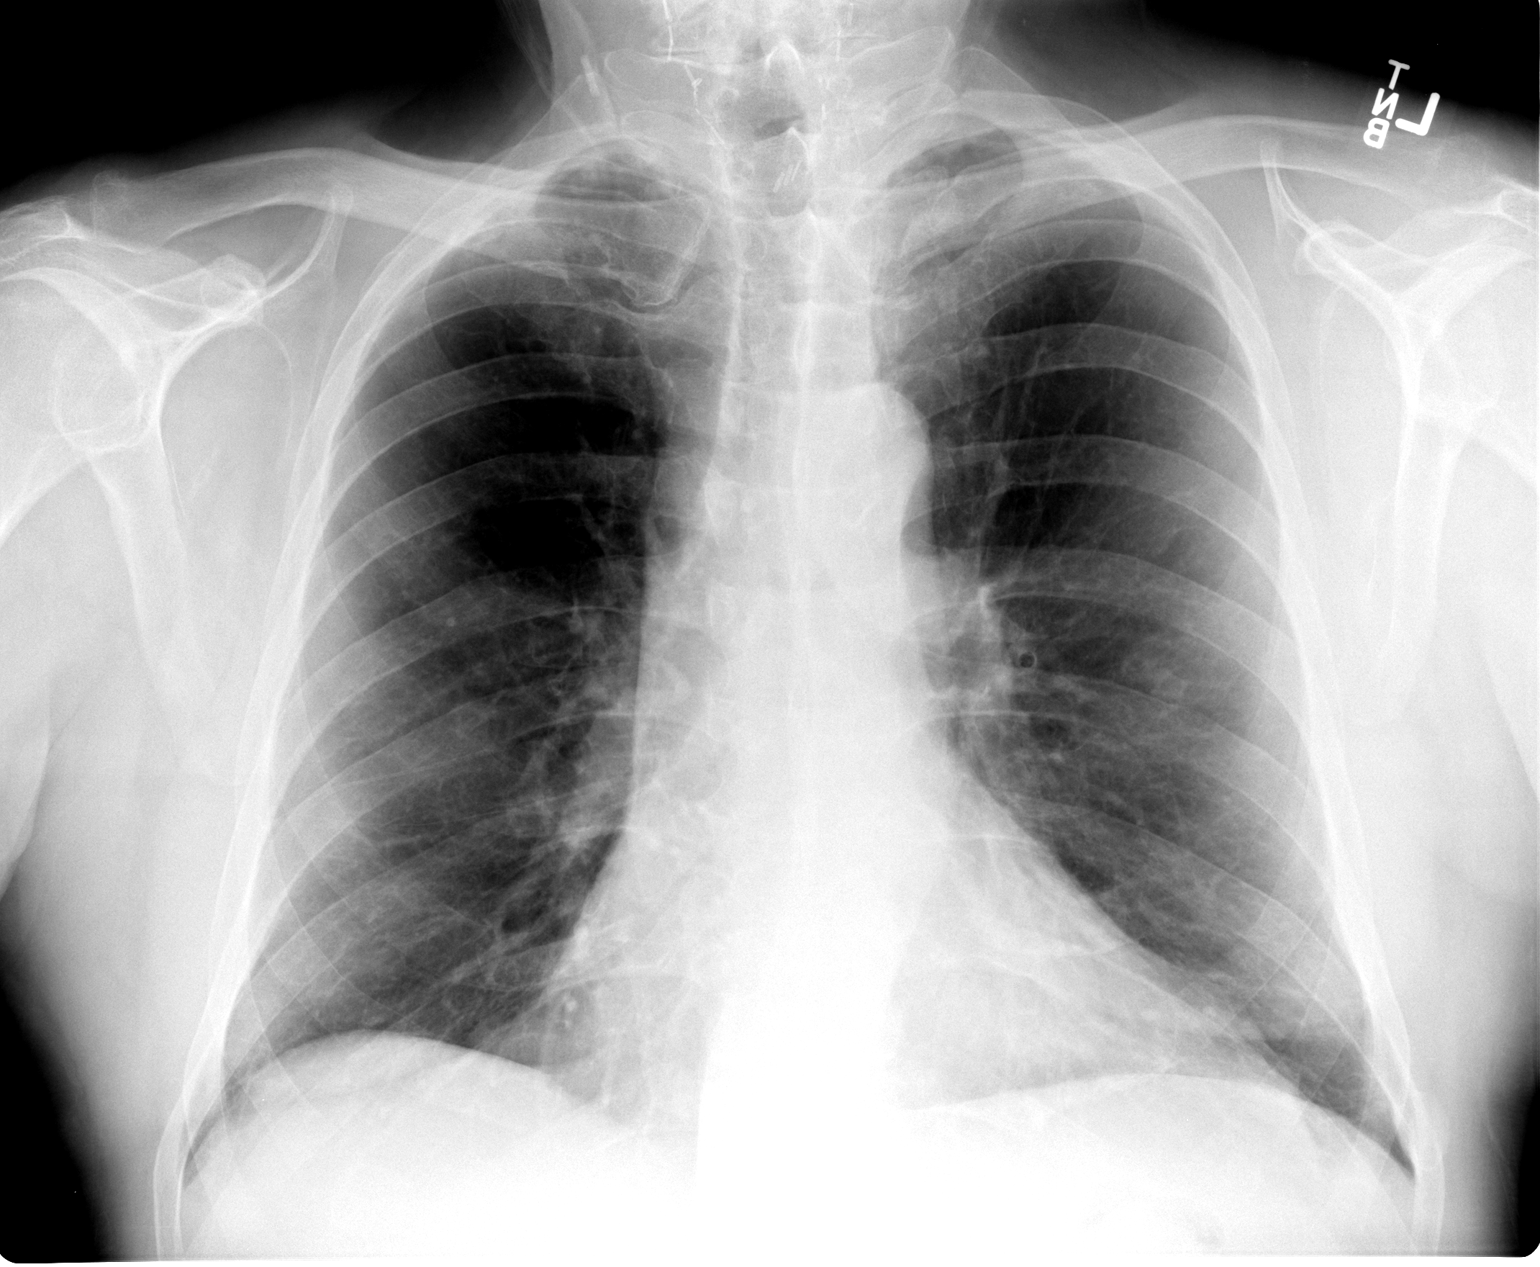

[view not recorded (2 of 2)]
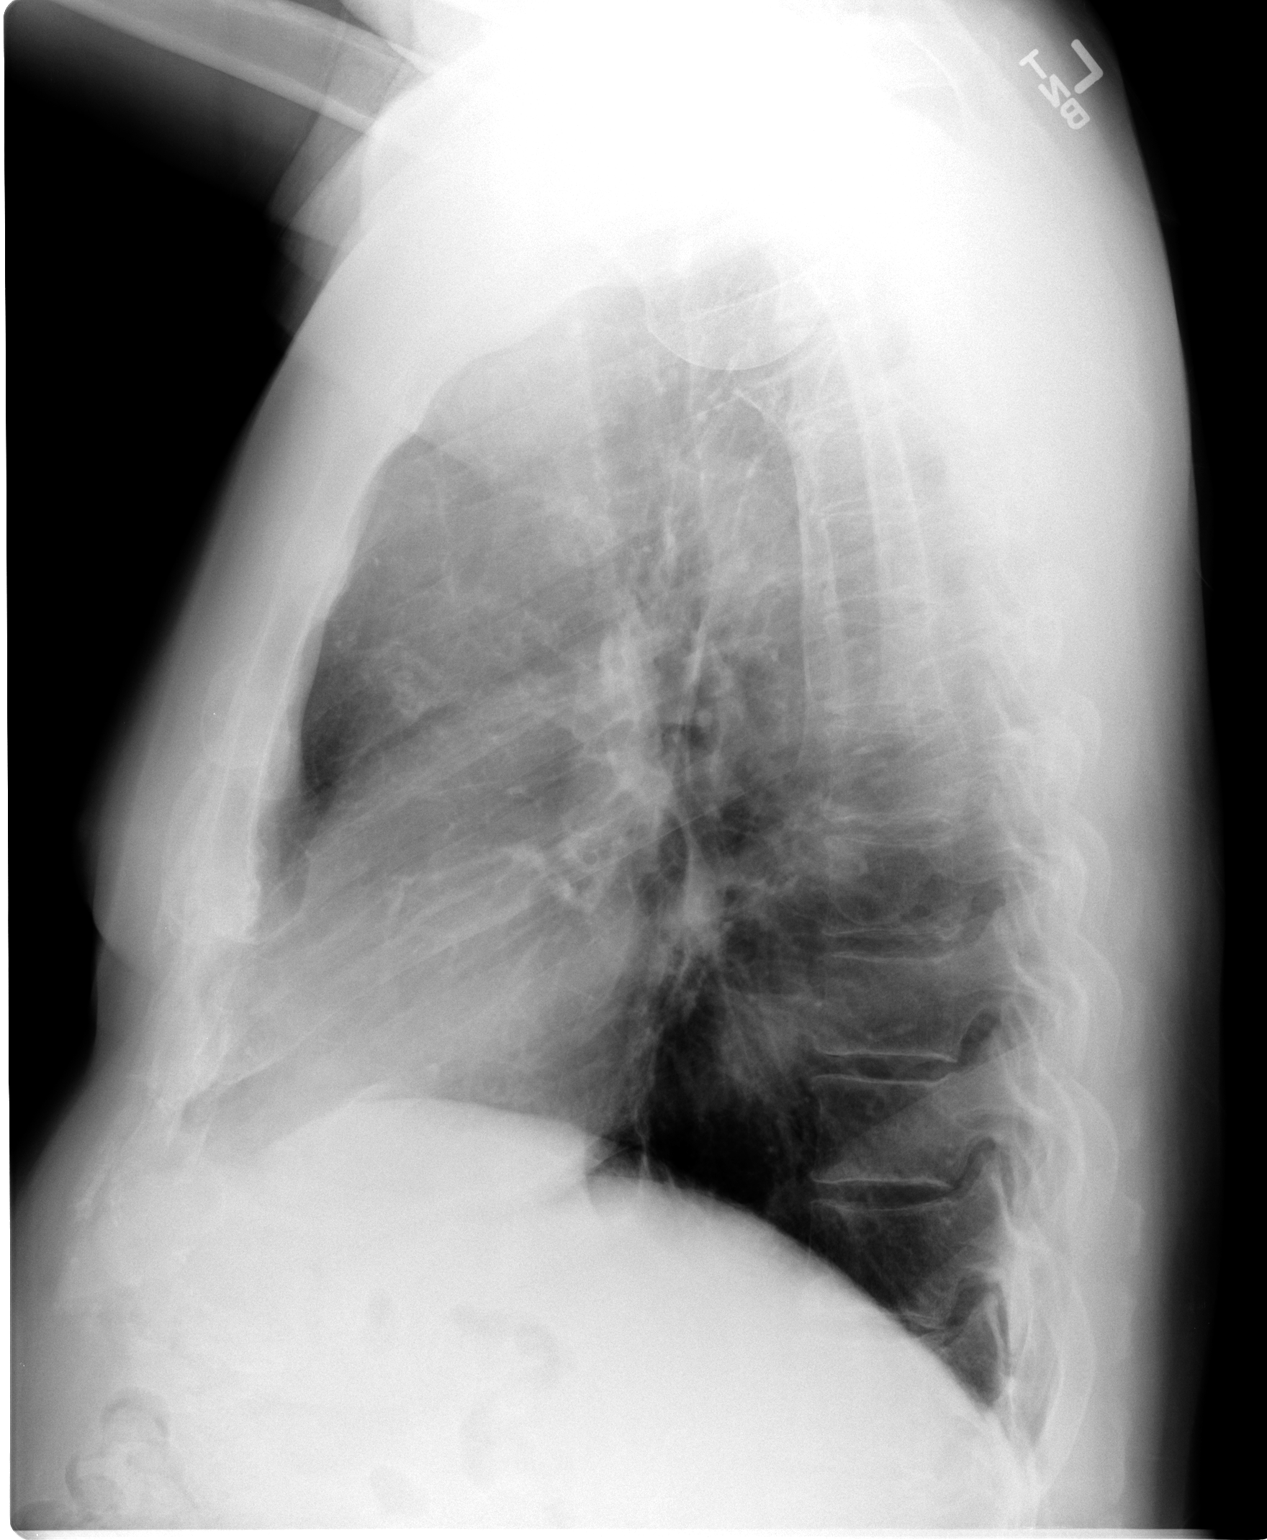

[2 of 2 positions shown; findings below may reference images not displayed]

FINDINGS: Borderline enlargement of cardiac silhouette.
Tortuous aorta with atherosclerotic calcification.
Mediastinal contours and pulmonary vascularity normal.
Chronic bronchitic changes and suspect mild emphysematous changes
raising question of COPD.
No pulmonary infiltrate, pleural effusion or pneumothorax.
No acute osseous findings.
IMPRESSION: Question COPD.
No acute abnormalities.

## 2012-02-19 ENCOUNTER — Encounter (HOSPITAL_COMMUNITY): Payer: Self-pay | Admitting: Respiratory Therapy

## 2012-02-20 ENCOUNTER — Encounter (HOSPITAL_COMMUNITY)
Admission: RE | Disposition: A | Discharge status: Home / Self Care | Payer: Self-pay | Source: Ambulatory Visit | Attending: Cardiovascular Disease

## 2012-02-20 ENCOUNTER — Ambulatory Visit (HOSPITAL_COMMUNITY)
Admission: RE | Admit: 2012-02-20 | Discharge: 2012-02-20 | Disposition: A | Discharge status: Home / Self Care | Payer: Medicare Other | Source: Ambulatory Visit | Attending: Cardiovascular Disease | Admitting: Cardiovascular Disease

## 2012-02-20 DIAGNOSIS — I2510 Atherosclerotic heart disease of native coronary artery without angina pectoris: Secondary | ICD-10-CM | POA: Insufficient documentation

## 2012-02-20 DIAGNOSIS — Z9861 Coronary angioplasty status: Secondary | ICD-10-CM | POA: Insufficient documentation

## 2012-02-20 HISTORY — PX: LEFT HEART CATHETERIZATION WITH CORONARY ANGIOGRAM: SHX5451

## 2012-02-20 SURGERY — LEFT HEART CATHETERIZATION WITH CORONARY ANGIOGRAM
Anesthesia: LOCAL

## 2012-02-20 MED ORDER — SODIUM CHLORIDE 0.9 % IJ SOLN: 3.0000 mL | Freq: Two times a day (BID) | INTRAMUSCULAR | Status: DC

## 2012-02-20 MED ORDER — ACETAMINOPHEN 325 MG PO TABS: 650.0000 mg | ORAL_TABLET | ORAL | Status: DC | PRN

## 2012-02-20 MED ORDER — SODIUM CHLORIDE 0.9 % IV SOLN
INTRAVENOUS | Status: DC
  Administered 2012-02-20: 1000 mL via INTRAVENOUS
  Administered 2012-02-20: 07:00:00 via INTRAVENOUS

## 2012-02-20 MED ORDER — SODIUM CHLORIDE 0.9 % IJ SOLN: 3.0000 mL | INTRAMUSCULAR | Status: DC | PRN

## 2012-02-20 MED ORDER — LIDOCAINE HCL (PF) 1 % IJ SOLN
INTRAMUSCULAR | Status: AC
  Filled 2012-02-20: qty 30

## 2012-02-20 MED ORDER — HEPARIN (PORCINE) IN NACL 2-0.9 UNIT/ML-% IJ SOLN
INTRAMUSCULAR | Status: AC
  Filled 2012-02-20: qty 2000

## 2012-02-20 MED ORDER — ASPIRIN 81 MG PO CHEW
324.0000 mg | CHEWABLE_TABLET | ORAL | Status: AC
  Administered 2012-02-20: 324 mg via ORAL
  Filled 2012-02-20: qty 4

## 2012-02-20 MED ORDER — NITROGLYCERIN 0.2 MG/ML ON CALL CATH LAB
INTRAVENOUS | Status: AC
  Filled 2012-02-20: qty 1

## 2012-02-20 MED ORDER — MIDAZOLAM HCL 2 MG/2ML IJ SOLN
INTRAMUSCULAR | Status: AC
  Filled 2012-02-20: qty 2

## 2012-02-20 MED ORDER — ISOSORBIDE MONONITRATE ER 30 MG PO TB24: 30.0000 mg | ORAL_TABLET | Freq: Every day | ORAL | Status: DC

## 2012-02-20 MED ORDER — SODIUM CHLORIDE 0.9 % IV SOLN: INTRAVENOUS | Status: DC

## 2012-02-20 MED ORDER — SODIUM CHLORIDE 0.9 % IV SOLN: 250.0000 mL | INTRAVENOUS | Status: DC | PRN

## 2012-02-20 MED ORDER — FENTANYL CITRATE 0.05 MG/ML IJ SOLN
INTRAMUSCULAR | Status: AC
  Filled 2012-02-20: qty 2

## 2012-02-20 MED ORDER — ONDANSETRON HCL 4 MG/2ML IJ SOLN: 4.0000 mg | Freq: Four times a day (QID) | INTRAMUSCULAR | Status: DC | PRN

## 2012-02-20 NOTE — CV Procedure (Signed)
Cardiac Catherization  Nikki D Waln, 71 y.o., male  Full note dictated; see diagram in chart  DICTATION # [id number], [id number]  Significant marked coronary calcification of LM, LAD, LCX, RCA.  LM: calcified with osial taper to 60 - 70% with 10 -15 mmhg pressure dampening with 5 fr catheter LAD 50% septal LCX: 40% prox, 70% OM1 RCA: occluded at origen with left to PDA collaterals  LV fxv: EF 55% min posterobasal inferior hypokinesis  Aortoiliac system: without sig narrowing  REC: TTS eval and Dr Henderson [place] of [facility]  Lennette Bihari, MD, [facility] 02/20/2012 11:17 AM

## 2012-02-20 NOTE — Cardiovascular Report (Signed)
NAMEMarland Kitchen  ALPHA, [name] [name] NO.:  [id number]  MEDICAL RECORD NO.:  [id number]  LOCATION:  [facility]                         FACILITY:  [facility]  PHYSICIAN:  [name] [name], M.D.     DATE OF BIRTH:  [birth year]  DATE OF PROCEDURE:  02/20/2012 DATE OF DISCHARGE:                           CARDIAC CATHETERIZATION   PROCEDURE:  Cardiac catheterization.  INDICATIONS:  [name] [name] is a 71 year old gentleman who has a history of remote stenting to a blood vessel in 1997 at [facility].  He did have mild-to-moderate concomitant CAD elsewhere. Apparently, the patient has developed progressive right carotid stenosis as a need for carotid endarterectomy.  He recently saw Dr. Alanda Amass for evaluation of progressive fatigue, exertional shortness of breath prior to undergoing his carotid enterectomy.  An exercise Myoview scan was done, which was interpreted as high risk with 2 mm ST-segment depression and lateral wall ischemia.  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  He now presents for cardiac catheterization.  PROCEDURE:  After premedication with Versed 2 mg plus fentanyl 25 mcg, the patient was prepped and draped in usual fashion.  His right femoral artery was punctured anteriorly and a 5-French sheath was inserted without difficulty.  Diagnostic cardiac catheterization was done utilizing 5-French left catheters.  A 5-French right as well as no- torque right was used.  Of note, with each engagement of the left main with the 5-French FL4 catheter, there was aortic pressure dampening of 10-15 mm.  A 5-French pigtail catheter was used for biplane cine left ventriculography.  Distal aortography was performed.  The patient tolerated the procedure well.  Hemostasis was obtained by direct manual pressure.  HEMODYNAMIC DATA:  Central aortic pressure was 120/68.  Left ventricular pressure  was 120/10.  ANGIOGRAPHIC DATA:  Fluoroscopy revealed extensive and marked coronary calcification involving the left main, LAD, circumflex, and right coronary artery diffusely.  The left main coronary artery had extensive calcification and there did appear to be significant tapering of the calcification to at least 70% at the ostium.  With each engagement of the catheter, the AO pressure dampened by 10-15 mm.  Angiography of the left main did suggest narrowing perhaps of 60-70%, but this was difficult to visualize, but the calcification clearly tapered significantly.  There also did appear to be mild tapering of the distal left main prior to bifurcating into the LAD and circumflex system.  The LAD again was diffusely calcified and had 50-60% ostial narrowing in the first septal perforating artery.  There did not appear to be significant focal intraluminal stenoses.  The circumflex vessel was calcified.  There was 40% proximal stenosis prior to giving rise to the marginal 1 vessel.  There was 50-60% narrowing in the proximal portion of the marginal vessel and 70% narrowing after giving rise to a small proximal branch.  The circumflex also supplied a distal PL-like vessel and marginal vessel.  There was significant collateralization to the PDA vessel via the left circulation.  The right coronary artery was extensively calcified.  The RCA was totally occluded at the origin.  There was faint visualization of the conus  branch, which was visualized.  Biplane cine left ventriculography revealed preserved global LV contractility with an ejection fraction of at least 55%.  There was a possible minimal area of mild hypokinesis in the posterobasal region. On the LAO projection, contractility was normal.  Distal aortography did not demonstrate any renal artery stenosis.  There was no significant aortoiliac disease.  IMPRESSION: 1. Normal left ventricular function with an ejection fraction  of at     least 55% with suggestion of minimal posterobasal inferior     hypocontractility. 2. Extensive coronary calcification involving the left main, left     anterior descending artery, left circumflex, and right coronary     arteries. 3. Probable ostial tapering of the left main to 60-70% with evidence     for AO pressure dampening by 10-15 mm concordant with ostial     stenosis. 4. 40% proximal circumflex with 70% circumflex marginal 1 stenosis. 5. Total occlusion of the right coronary artery at the ostium with     extensive collateralization to the posterior descending artery     vessel via the left coronary circulation.  DISCUSSION:  [name] [name] is a need for carotid endarterectomy surgery and has seen Dr. Cari Caraway for this.  The patient has total occlusion of his RCA, which collateralizes from the left system.  There is extensive coronary calcification involving the left main, LAD, circumflex system with suggestion of probable significant left main stenosis ostially, which was not seen on all views, but highly suggestive with a 5-French catheter dampening by 10-15 mm with each left main engagement.  Surgical consultation for consideration of CABG will be recommended and Dr. Cari Caraway will be notified for coordination of evaluations.          ______________________________ [name] [name], M.D.     TK/MEDQ  D:  02/20/2012  T:  02/20/2012  Job:  [id number]  cc:   Gerlene Burdock A. Alanda Amass, M.D. Di Kindle. Edilia Bo, M.D. Catalina Pizza, M.D.

## 2012-02-20 NOTE — H&P (Signed)
  Updated H&P:  Seee complete dictated note bt Dr. Alanda Amass from 01/29/12.  Since Visit pt underwent an exercise myoview scan which was positive for 2 mm ST depression and lateral wall ischemia. He now presents for cath scheduled by Dr. Alanda Amass prior to planned R carotid surgery. No change in Pex. Labs reviewed.  Discussed cath and possible PCI if indicated with patient and family.

## 2012-02-20 NOTE — Progress Notes (Signed)
UP TO BATHROOM AND SM AMT OOZING NOTED AND DRESSING CHANGED AND WALKED AGAIN AND NO OOZING NOTED; DR DICKSON IN TO SEE

## 2012-02-21 ENCOUNTER — Encounter: Payer: Self-pay | Admitting: *Deleted

## 2012-02-21 NOTE — Procedures (Unsigned)
CAROTID DUPLEX EXAM  INDICATION:  Follow up carotid disease.  HISTORY: Diabetes:  No. Cardiac:  Yes. Hypertension:  Yes. Smoking:  No. Previous Surgery:  No. CV History: Amaurosis Fugax No, Paresthesias No, Hemiparesis No.                                      RIGHT             LEFT Brachial systolic pressure:         114               114 Brachial Doppler waveforms:         WNL               WNL Vertebral direction of flow:        Abnormal antegrade                  NV DUPLEX VELOCITIES (cm/sec) CCA peak systolic                   84                81 ECA peak systolic                   363               NV ICA peak systolic                   325               190 ICA end diastolic                   128               73 PLAQUE MORPHOLOGY:                  Calcific          Calcific PLAQUE AMOUNT:                      Severe            Moderate to severe PLAQUE LOCATION:                    CCA/ECA/ICA       CCA/ECA/ICA  IMPRESSION: 1. 80% to 99% right carotid stenosis.  Tightest area of lesion appears     to be just proximal to the internal carotid artery origin.  Plaque     is calcific with acoustic shadow.  Extent of true internal carotid     artery disease is difficult to determine; however, waveforms are     turbulent distally. 2. Right external carotid artery stenosis is present. 3. 60% to 79% left internal carotid artery stenosis.  Plaque is     calcific, and velocities may be under-estimated. 4. Left external carotid artery could not be visualized. 5. Right vertebral artery appears abnormal, possibly due to thyroid     removal.  The left vertebral artery could not be visualized. 6. Hypervascularization is observed bilaterally, again, possibly due     to thyroid removal.  ___________________________________________ Di Kindle. Edilia Bo, M.D.  LT/MEDQ  D:  02/07/2012  T:  02/07/2012  Job:  [id number]

## 2012-02-23 ENCOUNTER — Encounter: Payer: Self-pay | Admitting: Thoracic Surgery (Cardiothoracic Vascular Surgery)

## 2012-02-23 ENCOUNTER — Encounter (HOSPITAL_COMMUNITY): Payer: Self-pay | Admitting: Pharmacy Technician

## 2012-02-23 ENCOUNTER — Institutional Professional Consult (permissible substitution) (INDEPENDENT_AMBULATORY_CARE_PROVIDER_SITE_OTHER): Payer: Medicare Other | Admitting: Thoracic Surgery (Cardiothoracic Vascular Surgery)

## 2012-02-23 ENCOUNTER — Encounter (HOSPITAL_COMMUNITY): Payer: Medicare Other

## 2012-02-23 ENCOUNTER — Other Ambulatory Visit (HOSPITAL_COMMUNITY): Payer: Self-pay | Admitting: Radiology

## 2012-02-23 ENCOUNTER — Other Ambulatory Visit: Payer: Self-pay | Admitting: Thoracic Surgery (Cardiothoracic Vascular Surgery)

## 2012-02-23 VITALS — BP 132/77 | HR 61 | Resp 16 | Ht 73.0 in | Wt 211.0 lb

## 2012-02-23 DIAGNOSIS — I219 Acute myocardial infarction, unspecified: Secondary | ICD-10-CM | POA: Insufficient documentation

## 2012-02-23 DIAGNOSIS — I779 Disorder of arteries and arterioles, unspecified: Secondary | ICD-10-CM

## 2012-02-23 DIAGNOSIS — I2510 Atherosclerotic heart disease of native coronary artery without angina pectoris: Secondary | ICD-10-CM

## 2012-02-23 DIAGNOSIS — I6521 Occlusion and stenosis of right carotid artery: Secondary | ICD-10-CM

## 2012-02-23 DIAGNOSIS — I6529 Occlusion and stenosis of unspecified carotid artery: Secondary | ICD-10-CM

## 2012-02-23 HISTORY — DX: Occlusion and stenosis of right carotid artery: I65.21
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
## 2012-02-26 ENCOUNTER — Other Ambulatory Visit: Payer: Self-pay

## 2012-02-26 ENCOUNTER — Other Ambulatory Visit: Payer: Self-pay | Admitting: *Deleted

## 2012-02-26 DIAGNOSIS — I2510 Atherosclerotic heart disease of native coronary artery without angina pectoris: Secondary | ICD-10-CM

## 2012-02-26 NOTE — Pre-Procedure Instructions (Signed)
[name]  02/26/2012   Your procedure is scheduled on:  02/29/12  Report to [facility] at 530 AM.  Call this number if you have problems the morning of surgery: [phone]   Remember:   Do not eat food:After Midnight.  May have clear liquids:until Midnight .  Clear liquids include soda, tea, black coffee, apple or grape juice, broth.  Take these medicines the morning of surgery with A SIP OF WATER: amlodipine,imdur,metoprolol,prilosec,synthroid   Do not wear jewelry, make-up or nail polish.  Do not wear lotions, powders, or perfumes. You may wear deodorant.  Do not shave 48 hours prior to surgery. Men may shave face and neck.  Do not bring valuables to the hospital.  Contacts, dentures or bridgework may not be worn into surgery.  Leave suitcase in the car. After surgery it may be brought to your room.  For patients admitted to the hospital, checkout time is 11:00 AM the day of discharge.   Patients discharged the day of surgery will not be allowed to drive home.  Name and phone number of your driver: family  Special Instructions: CHG Shower Use Special Wash: 1/2 bottle night before surgery and 1/2 bottle morning of surgery.   Please read over the following fact sheets that you were given: Pain Booklet, Coughing and Deep Breathing, Blood Transfusion Information, Open Heart Packet, Surgical Site Infection Prevention and Anesthesia Post-op Instructions

## 2012-02-27 ENCOUNTER — Encounter (HOSPITAL_COMMUNITY): Payer: Self-pay | Admitting: Emergency Medicine

## 2012-02-27 ENCOUNTER — Emergency Department (HOSPITAL_COMMUNITY)
Admission: RE | Admit: 2012-02-27 | Discharge: 2012-02-27 | Disposition: A | Discharge status: Home / Self Care | Payer: Medicare Other | Source: Ambulatory Visit | Attending: Thoracic Surgery (Cardiothoracic Vascular Surgery) | Admitting: Thoracic Surgery (Cardiothoracic Vascular Surgery)

## 2012-02-27 ENCOUNTER — Ambulatory Visit (HOSPITAL_COMMUNITY): Admission: RE | Admit: 2012-02-27 | Payer: Medicare Other | Source: Ambulatory Visit

## 2012-02-27 ENCOUNTER — Ambulatory Visit (HOSPITAL_COMMUNITY): Payer: Medicare Other

## 2012-02-27 ENCOUNTER — Other Ambulatory Visit: Payer: Self-pay

## 2012-02-27 ENCOUNTER — Encounter (HOSPITAL_COMMUNITY)
Admission: RE | Admit: 2012-02-27 | Discharge: 2012-02-27 | Disposition: A | Discharge status: Home / Self Care | Payer: Medicare Other | Source: Ambulatory Visit | Attending: Thoracic Surgery (Cardiothoracic Vascular Surgery) | Admitting: Thoracic Surgery (Cardiothoracic Vascular Surgery)

## 2012-02-27 ENCOUNTER — Emergency Department (HOSPITAL_COMMUNITY)
Admission: EM | Admit: 2012-02-27 | Discharge: 2012-02-27 | Disposition: A | Discharge status: Home / Self Care | Payer: Medicare Other | Attending: Emergency Medicine | Admitting: Emergency Medicine

## 2012-02-27 DIAGNOSIS — E785 Hyperlipidemia, unspecified: Secondary | ICD-10-CM | POA: Insufficient documentation

## 2012-02-27 DIAGNOSIS — I10 Essential (primary) hypertension: Secondary | ICD-10-CM | POA: Insufficient documentation

## 2012-02-27 DIAGNOSIS — Z79899 Other long term (current) drug therapy: Secondary | ICD-10-CM | POA: Insufficient documentation

## 2012-02-27 DIAGNOSIS — I2510 Atherosclerotic heart disease of native coronary artery without angina pectoris: Secondary | ICD-10-CM | POA: Insufficient documentation

## 2012-02-27 DIAGNOSIS — K219 Gastro-esophageal reflux disease without esophagitis: Secondary | ICD-10-CM | POA: Insufficient documentation

## 2012-02-27 DIAGNOSIS — R55 Syncope and collapse: Secondary | ICD-10-CM | POA: Insufficient documentation

## 2012-02-27 DIAGNOSIS — I252 Old myocardial infarction: Secondary | ICD-10-CM | POA: Insufficient documentation

## 2012-02-27 DIAGNOSIS — G4730 Sleep apnea, unspecified: Secondary | ICD-10-CM | POA: Insufficient documentation

## 2012-02-27 LAB — PULMONARY FUNCTION TEST

## 2012-02-27 LAB — GLUCOSE, CAPILLARY: Glucose-Capillary: 118 mg/dL — ABNORMAL HIGH (ref 70–99)

## 2012-02-27 NOTE — ED Provider Notes (Signed)
History     CSN: [id number]  Arrival date & time 02/27/12  1307   First MD Initiated Contact with Patient 02/27/12 1316      Chief Complaint  Patient presents with  . Loss of Consciousness    (Consider location/radiation/quality/duration/timing/severity/associated sxs/prior treatment) Patient is a 71 y.o. male presenting with syncope. The history is provided by the patient.  Loss of Consciousness This is a new problem. The current episode started today. The problem occurs rarely. The problem has been resolved. Pertinent negatives include no abdominal pain, chest pain, congestion, coughing, fatigue, fever, headaches, nausea, neck pain, numbness, rash, sore throat, vomiting or weakness. Nothing aggravates the symptoms. He has tried nothing for the symptoms. The treatment provided significant relief.    Past Medical History  Diagnosis Date  . Hypertension   . Hyperlipidemia   . CAD (coronary artery disease)   . Sleep apnea   . Wears glasses     or contacts  . Deviated septum   . Thyroid nodule   . GERD (gastroesophageal reflux disease)   . Heart attack   . MI (myocardial infarction) 1997  . 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 CAD (coronary artery disease) 12/04/2011    Left Main Disease with 3- vessel CAD     Past Surgical History  Procedure Date  . Coronary stent placement   . Nasal septum surgery   . Totol thyroidectomy 02/06/2011  . Foot surgery 06/30/11    artificial joint in big toe right foot   . Colonoscopy 15-20years and 12/04/11    Done at [facility]  . Upper gastrointestinal endoscopy 35-40 years ago    [facility]  . Colonoscopy 12/11/2011    Procedure: COLONOSCOPY;  Surgeon: Malissa Hippo, MD;  Location: [facility];  Service: Endoscopy;  Laterality: N/A;  830    Family History  Problem Relation Age of Onset  . Hypertension Mother   . Healthy Sister   . Healthy Brother   . Healthy Son   .  Colon cancer Neg Hx     History  Substance Use Topics  . Smoking status: Former Smoker    Types: Cigarettes    Quit date: 12/03/2001  . Smokeless tobacco: Never Used   Comment: smoked 42yrs, smoked 1/2 pack a day  . Alcohol Use: Yes     social- beer , not on regular basis      Review of Systems  Constitutional: Negative for fever, activity change, appetite change and fatigue.  HENT: Negative for congestion, sore throat, facial swelling, rhinorrhea, trouble swallowing, neck pain, neck stiffness, voice change and sinus pressure.   Eyes: Negative.   Respiratory: Negative for cough, choking, chest tightness, shortness of breath and wheezing.   Cardiovascular: Positive for syncope. Negative for chest pain.  Gastrointestinal: Negative for nausea, vomiting and abdominal pain.  Genitourinary: Negative for dysuria, urgency, frequency, hematuria, flank pain and difficulty urinating.  Musculoskeletal: Negative for back pain and gait problem.  Skin: Negative for rash and wound.  Neurological: Positive for syncope. Negative for facial asymmetry, weakness, numbness and headaches.  Psychiatric/Behavioral: Negative for behavioral problems, confusion and agitation. The patient is not nervous/anxious and is not hyperactive.   All other systems reviewed and are negative.    Allergies  Review of patient's allergies indicates no known allergies.  Home Medications   Current Outpatient Rx  Name Route Sig Dispense Refill  . AMLODIPINE BESYLATE-VALSARTAN 5-160 MG PO TABS Oral  Take 1 tablet by mouth daily.    . ASPIRIN EC 81 MG PO TBEC Oral Take 81 mg by mouth daily.    Marland Kitchen CALCIUM CARBONATE 600 MG PO TABS Oral Take 600 mg by mouth daily.    Marland Kitchen VITAMIN D 1000 UNITS PO TABS Oral Take 1,000 Units by mouth daily.    . CO Q-10 100 MG PO CAPS Oral Take 100 mg by mouth daily.     Marland Kitchen DICLOFENAC SODIUM 75 MG PO TBEC Oral Take 75 mg by mouth 2 (two) times daily.    Marland Kitchen FOLIC ACID 400 MCG PO TABS Oral Take 400  mcg by mouth daily.    . ISOSORBIDE MONONITRATE ER 30 MG PO TB24 Oral Take 30 mg by mouth daily.    Marland Kitchen METOPROLOL SUCCINATE ER 100 MG PO TB24 Oral Take 150 mg by mouth daily.     . OSTEO BI-FLEX ADV DOUBLE ST PO TABS Oral Take 1 tablet by mouth 2 (two) times daily.     . ADULT MULTIVITAMIN W/MINERALS CH Oral Take 1 tablet by mouth daily.    Marland Kitchen NIACIN 500 MG PO TABS Oral Take 500 mg by mouth daily with breakfast.    . FISH OIL 1000 MG PO CPDR Oral Take 1,000 mg by mouth 2 (two) times daily.     Marland Kitchen OMEPRAZOLE 20 MG PO CPDR Oral Take 20 mg by mouth daily.    Marland Kitchen METAMUCIL PO Oral Take 3 capsules by mouth daily.     Marland Kitchen SIMVASTATIN 40 MG PO TABS Oral Take 40 mg by mouth every evening.    Marland Kitchen SYNTHROID 125 MCG PO TABS Oral Take 1 tablet (125 mcg total) by mouth daily. 30 tablet 6    Dispense as written.    BP 125/83  Pulse 60  Temp 97.8 F (36.6 C) (Axillary)  Resp 18  SpO2 96%  Physical Exam  Nursing note and vitals reviewed. Constitutional: He is oriented to person, place, and time. He appears well-developed and well-nourished. No distress.  HENT:  Head: Normocephalic and atraumatic.  Right Ear: External ear normal.  Left Ear: External ear normal.  Mouth/Throat: No oropharyngeal exudate.  Eyes: Conjunctivae and EOM are normal. Pupils are equal, round, and reactive to light. Right eye exhibits no discharge. Left eye exhibits no discharge.  Neck: Normal range of motion. Neck supple. No JVD present. No tracheal deviation present. No thyromegaly present.  Cardiovascular: Normal rate, regular rhythm, normal heart sounds and intact distal pulses.  Exam reveals no gallop and no friction rub.   No murmur heard. Pulmonary/Chest: Effort normal and breath sounds normal. No respiratory distress. He has no wheezes. He exhibits no tenderness.  Abdominal: Soft. Bowel sounds are normal. He exhibits no distension. There is no tenderness. There is no rebound and no guarding.  Musculoskeletal: Normal range of  motion. He exhibits no edema and no tenderness.  Lymphadenopathy:    He has no cervical adenopathy.  Neurological: He is alert and oriented to person, place, and time. He has normal strength. No cranial nerve deficit or sensory deficit. He displays a negative Romberg sign. GCS eye subscore is 4. GCS verbal subscore is 5. GCS motor subscore is 6.       Normal finger-nose heel shin. Normal double simultaneous stimulation. No truncal ataxia normal gait no pronator drift normal sensation normal 5 out of 5 motor normal cranial nerve exam normal visual fields normal visual confrontation  Skin: Skin is warm and dry. No rash noted. He  is not diaphoretic. No pallor.  Psychiatric: He has a normal mood and affect. His behavior is normal.    ED Course  Procedures (including critical care time)  Labs Reviewed  GLUCOSE, CAPILLARY - Abnormal; Notable for the following:    Glucose-Capillary 118 (*)     All other components within normal limits   No results found.   No diagnosis found.    MDM  71 year old male patient with past medical history hypertension hyperlipidemia coronary artery disease carotid artery disease presents after having a syncopal episode while performing a pulmonary function test. Patient was having pulmonary function testing as a preoperative evaluation for CABG and carotid endarterectomy. Patient says he was in a hot room and asked to hyperventilate and it progressively made him lightheaded to the point where he has to sit down and reportedly passed out momentarily. Patient without any seizure activity chest pain shortness of breath abdominal pain. Patient feels in his normal state of health at this point in time. Patient immediately recovered after the episode. Patient now asymptomatic. Patient normal EKG heart sounds lung sounds and a normal neurological exam as detailed above. Doubt new intracranial process or new heart disease. Patient now asymptomatic with normal EKG and normal  exam will be encouraged followup with CT surgery to complete his preoperative process.   Date: 02/27/2012  Rate: 59  Rhythm: normal sinus rhythm  QRS Axis: normal  Intervals: normal  ST/T Wave abnormalities: normal  Conduction Disutrbances: none  Narrative Interpretation:   Old EKG Reviewed: No significant changes noted   Case discussed with Dr.Caporossi  Sherryl Manges, MD 02/27/12 1527

## 2012-02-27 NOTE — ED Provider Notes (Signed)
I saw and evaluated the patient, reviewed the resident's note and I agree with the findings and plan. Has severe cad.  Is scheduled for cabg and carotid endarterectomy on Thurs.  Was getting PFTs in pulm lab for preop eval.  Was hyperventilating.  Passed out.  Recovered.  Had no sxs before fainting.  Is asx now. Nl pe.  ecg nl. No further tests indicated.  Spoke with both dr. Cornelius Moras and se cardiologist.  Will release to home to complete testing at later date.  Cheri Guppy, MD 02/27/12 1538

## 2012-02-27 NOTE — ED Provider Notes (Signed)
I saw and evaluated the patient, reviewed the resident's note and I agree with the findings and plan.  Cheri Guppy, MD 02/27/12 1600

## 2012-02-27 NOTE — ED Notes (Signed)
Pt here from OP having PFT for CABG this Thursday; pt had 2 witnessed syncopal event while attempting test; pt sts lightheaded prior to event and per witness pt was pale and unresponsive approx 1 min total; pt alert at present; pt denies CP or SOB; pt sts some nausea at present

## 2012-02-27 NOTE — Code Documentation (Signed)
Code Blue called at [employer].  Patient here as OP for PFT testing.  Per Resp Therapy staff patient with 2 syncopal episodes during testing, lasting about a minute.  Upon arrival of code team, patient sitting in the testing booth.  Patient responding, but remains pale and dusky.  O2 provided with ambu mask.  Assisted to bed, placed on 100% NRB, HR 60, BP 154/67 CBG 118.  IV started Left hand, NS started at [facility].  Transported to ED via bed with Zoll pad HR monitoring.  Patient conversant, denies CP, improved skin color.  Wife at bedside.  Report given to ED RN at bedside.

## 2012-02-28 ENCOUNTER — Encounter (HOSPITAL_COMMUNITY): Payer: Self-pay

## 2012-02-28 ENCOUNTER — Encounter (HOSPITAL_COMMUNITY)
Admission: RE | Admit: 2012-02-28 | Discharge: 2012-02-28 | Disposition: A | Discharge status: Home / Self Care | Payer: Medicare Other | Source: Ambulatory Visit | Attending: Thoracic Surgery (Cardiothoracic Vascular Surgery) | Admitting: Thoracic Surgery (Cardiothoracic Vascular Surgery)

## 2012-02-28 ENCOUNTER — Telehealth: Payer: Self-pay

## 2012-02-28 ENCOUNTER — Ambulatory Visit (HOSPITAL_COMMUNITY)
Admission: RE | Admit: 2012-02-28 | Discharge: 2012-02-28 | Disposition: A | Discharge status: Home / Self Care | Payer: Medicare Other | Source: Ambulatory Visit | Attending: Thoracic Surgery (Cardiothoracic Vascular Surgery) | Admitting: Thoracic Surgery (Cardiothoracic Vascular Surgery)

## 2012-02-28 VITALS — BP 134/74 | HR 62 | Temp 97.0°F | Resp 18 | Ht 73.0 in | Wt 211.1 lb

## 2012-02-28 DIAGNOSIS — I2510 Atherosclerotic heart disease of native coronary artery without angina pectoris: Secondary | ICD-10-CM | POA: Insufficient documentation

## 2012-02-28 DIAGNOSIS — Z01810 Encounter for preprocedural cardiovascular examination: Secondary | ICD-10-CM | POA: Insufficient documentation

## 2012-02-28 HISTORY — DX: Nausea with vomiting, unspecified: Z98.890

## 2012-02-28 HISTORY — DX: Hypothyroidism, unspecified: E03.9

## 2012-02-28 HISTORY — DX: Shortness of breath: R06.02

## 2012-02-28 HISTORY — DX: Other specified postprocedural states: R11.2

## 2012-02-28 LAB — APTT: aPTT: 24 seconds (ref 24–37)

## 2012-02-28 LAB — URINALYSIS, ROUTINE W REFLEX MICROSCOPIC
Bilirubin Urine: NEGATIVE
Hgb urine dipstick: NEGATIVE
Ketones, ur: NEGATIVE mg/dL
Protein, ur: NEGATIVE mg/dL
Urobilinogen, UA: 0.2 mg/dL (ref 0.0–1.0)

## 2012-02-28 LAB — COMPREHENSIVE METABOLIC PANEL
ALT: 18 U/L (ref 0–53)
Calcium: 9.4 mg/dL (ref 8.4–10.5)
GFR calc Af Amer: 66 mL/min — ABNORMAL LOW (ref >90)
Glucose, Bld: 114 mg/dL — ABNORMAL HIGH (ref 70–99)
Sodium: 137 mEq/L (ref 135–145)
Total Protein: 7.1 g/dL (ref 6.0–8.3)

## 2012-02-28 LAB — BLOOD GAS, ARTERIAL
Drawn by: 206361
FIO2: 0.21 %
O2 Saturation: 98.2 %
Patient temperature: 98.6

## 2012-02-28 LAB — PROTIME-INR: Prothrombin Time: 13 seconds (ref 11.6–15.2)

## 2012-02-28 LAB — CBC
Hemoglobin: 14.1 g/dL (ref 13.0–17.0)
MCH: 31.1 pg (ref 26.0–34.0)
MCHC: 35.1 g/dL (ref 30.0–36.0)
Platelets: 179 10*3/uL (ref 150–400)

## 2012-02-28 LAB — SURGICAL PCR SCREEN
MRSA, PCR: NEGATIVE
Staphylococcus aureus: NEGATIVE

## 2012-02-28 LAB — TYPE AND SCREEN
ABO/RH(D): O POS
Antibody Screen: NEGATIVE

## 2012-02-28 MED ORDER — DEXMEDETOMIDINE HCL IN NACL 400 MCG/100ML IV SOLN
0.1000 ug/kg/h | INTRAVENOUS | Status: AC
  Administered 2012-02-29: .2 ug/kg/h via INTRAVENOUS
  Filled 2012-02-28: qty 100

## 2012-02-28 MED ORDER — CHLORHEXIDINE GLUCONATE 4 % EX LIQD: 30.0000 mL | CUTANEOUS | Status: DC

## 2012-02-28 MED ORDER — SODIUM BICARBONATE 8.4 % IV SOLN
INTRAVENOUS | Status: AC
  Administered 2012-02-29: 11:00:00
  Filled 2012-02-28: qty 2.5

## 2012-02-28 MED ORDER — POTASSIUM CHLORIDE 2 MEQ/ML IV SOLN
80.0000 meq | INTRAVENOUS | Status: DC
  Filled 2012-02-28: qty 40

## 2012-02-28 MED ORDER — METOPROLOL TARTRATE 12.5 MG HALF TABLET: 12.5000 mg | ORAL_TABLET | Freq: Once | ORAL | Status: DC

## 2012-02-28 MED ORDER — NITROGLYCERIN IN D5W 200-5 MCG/ML-% IV SOLN
2.0000 ug/min | INTRAVENOUS | Status: AC
  Administered 2012-02-29: 5 ug/min via INTRAVENOUS
  Filled 2012-02-28: qty 250

## 2012-02-28 MED ORDER — TRANEXAMIC ACID (OHS) PUMP PRIME SOLUTION
2.0000 mg/kg | INTRAVENOUS | Status: DC
  Filled 2012-02-28: qty 1.92

## 2012-02-28 MED ORDER — DEXTROSE 5 % IV SOLN
30.0000 ug/min | INTRAVENOUS | Status: AC
  Administered 2012-02-29: 5 ug/min via INTRAVENOUS
  Filled 2012-02-28: qty 2

## 2012-02-28 MED ORDER — DEXTROSE 5 % IV SOLN
750.0000 mg | INTRAVENOUS | Status: DC
  Filled 2012-02-28 (×2): qty 750

## 2012-02-28 MED ORDER — TRANEXAMIC ACID 100 MG/ML IV SOLN
1.5000 mg/kg/h | INTRAVENOUS | Status: AC
  Administered 2012-02-29: 1.5 mg/kg/h via INTRAVENOUS
  Filled 2012-02-28: qty 25

## 2012-02-28 MED ORDER — TRANEXAMIC ACID (OHS) BOLUS VIA INFUSION
15.0000 mg/kg | INTRAVENOUS | Status: AC
  Administered 2012-02-29: 1437 mg via INTRAVENOUS
  Filled 2012-02-28: qty 1437

## 2012-02-28 MED ORDER — EPINEPHRINE HCL 1 MG/ML IJ SOLN
0.5000 ug/min | INTRAVENOUS | Status: DC
  Filled 2012-02-28: qty 4

## 2012-02-28 MED ORDER — DEXTROSE 5 % IV SOLN
1.5000 g | INTRAVENOUS | Status: AC
  Administered 2012-02-29: .75 g via INTRAVENOUS
  Administered 2012-02-29: 1.5 g via INTRAVENOUS
  Filled 2012-02-28 (×2): qty 1.5

## 2012-02-28 MED ORDER — DOPAMINE-DEXTROSE 3.2-5 MG/ML-% IV SOLN
2.0000 ug/kg/min | INTRAVENOUS | Status: DC
  Filled 2012-02-28: qty 250

## 2012-02-28 MED ORDER — SODIUM CHLORIDE 0.9 % IV SOLN
INTRAVENOUS | Status: AC
  Administered 2012-02-29: 2.1 [IU]/h via INTRAVENOUS
  Filled 2012-02-28: qty 1

## 2012-02-28 MED ORDER — VANCOMYCIN HCL 1000 MG IV SOLR
1500.0000 mg | INTRAVENOUS | Status: AC
  Administered 2012-02-29: 1500 mg via INTRAVENOUS
  Filled 2012-02-28: qty 1500

## 2012-02-28 MED ORDER — MAGNESIUM SULFATE 50 % IJ SOLN
40.0000 meq | INTRAMUSCULAR | Status: DC
  Filled 2012-02-28: qty 10

## 2012-02-28 NOTE — Telephone Encounter (Signed)
Called patient to confirm pre-operative meds per Dr. Orvan July instructions.  Instructed the patient to take only Synthroid and Metoprolol morning of surgery.  Nothing else.  [name] [name] asked if it was okay to take half of a 10 mg Ambien at bedtime tonight (per his norm), which I told him would be okay.

## 2012-02-28 NOTE — Pre-Procedure Instructions (Addendum)
[name] [name]  02/28/2012   Your procedure is scheduled on:  02/29/12  Report to [facility] at 530 AM.  Call this number if you have problems the morning of surgery: [phone]   Remember:   Do not eat food:After Midnight.  May have clear liquids:until Midnight .  Clear liquids include soda, tea, black coffee, apple or grape juice, broth.  Take these medicines the morning of surgery with A SIP OF WATER: amlodipine, imdur, metoprolol, prilosec, synthroid   Do not wear jewelry, make-up or nail polish.  Do not wear lotions, powders, or perfumes. You may wear deodorant.  Do not shave 48 hours prior to surgery. Men may shave face and neck.  Do not bring valuables to the hospital.  Contacts, dentures or bridgework may not be worn into surgery.  Leave suitcase in the car. After surgery it may be brought to your room.  For patients admitted to the hospital, checkout time is 11:00 AM the day of discharge.   Patients discharged the day of surgery will not be allowed to drive home.  Name and phone number of your driver: family  Special Instructions: Incentive Spirometry - Practice and bring it with you on the day of surgery. and CHG Shower Use Special Wash: 1/2 bottle night before surgery and 1/2 bottle morning of surgery.   Please read over the following fact sheets that you were given: Pain Booklet, Coughing and Deep Breathing, Blood Transfusion Information, Open Heart Packet, Surgical Site Infection Prevention and Anesthesia Post-op Instructions       Marilynne Halsted, RN

## 2012-02-28 NOTE — Progress Notes (Signed)
Pre-op Cardiac Surgery  Carotid Findings:  Study done on 02/07/12 at [facility] showed 80-99% Right internal artery stenosis.  Upper Extremity Right Left  Brachial Pressures 116 116  Radial Waveforms tri tri  Ulnar Waveforms tri tri  Palmar Arch (Allen's Test) normal normal   Bilateral palpable pedal pulses.   Farrel Demark, RDMS

## 2012-02-28 NOTE — Progress Notes (Signed)
LM for Dee at [facility] requesting direction on whether pt should take his Xforge - amlodipine/valsartan combination. Asked that office call pt if he should not take this med. Also, informed them that pt will be stopping ASA and Voltaren today.

## 2012-02-29 ENCOUNTER — Telehealth: Payer: Self-pay | Admitting: Vascular Surgery

## 2012-02-29 ENCOUNTER — Encounter (HOSPITAL_COMMUNITY): Payer: Self-pay | Admitting: *Deleted

## 2012-02-29 ENCOUNTER — Inpatient Hospital Stay (HOSPITAL_COMMUNITY): Payer: Medicare Other

## 2012-02-29 ENCOUNTER — Ambulatory Visit (HOSPITAL_COMMUNITY): Payer: Medicare Other | Admitting: Certified Registered"

## 2012-02-29 ENCOUNTER — Inpatient Hospital Stay (HOSPITAL_COMMUNITY)
Admission: RE | Admit: 2012-02-29 | Discharge: 2012-03-05 | DRG: 236 | Disposition: A | Discharge status: Home / Self Care | Payer: Medicare Other | Source: Ambulatory Visit | Attending: Thoracic Surgery (Cardiothoracic Vascular Surgery) | Admitting: Thoracic Surgery (Cardiothoracic Vascular Surgery)

## 2012-02-29 ENCOUNTER — Encounter (HOSPITAL_COMMUNITY): Payer: Self-pay | Admitting: Certified Registered"

## 2012-02-29 ENCOUNTER — Encounter (HOSPITAL_COMMUNITY)
Admission: RE | Disposition: A | Discharge status: Home / Self Care | Payer: Self-pay | Source: Ambulatory Visit | Attending: Thoracic Surgery (Cardiothoracic Vascular Surgery)

## 2012-02-29 DIAGNOSIS — I658 Occlusion and stenosis of other precerebral arteries: Secondary | ICD-10-CM | POA: Diagnosis present

## 2012-02-29 DIAGNOSIS — I4891 Unspecified atrial fibrillation: Secondary | ICD-10-CM | POA: Diagnosis not present

## 2012-02-29 DIAGNOSIS — G4733 Obstructive sleep apnea (adult) (pediatric): Secondary | ICD-10-CM | POA: Diagnosis present

## 2012-02-29 DIAGNOSIS — Z951 Presence of aortocoronary bypass graft: Secondary | ICD-10-CM

## 2012-02-29 DIAGNOSIS — E039 Hypothyroidism, unspecified: Secondary | ICD-10-CM | POA: Diagnosis present

## 2012-02-29 DIAGNOSIS — J9819 Other pulmonary collapse: Secondary | ICD-10-CM | POA: Diagnosis not present

## 2012-02-29 DIAGNOSIS — I252 Old myocardial infarction: Secondary | ICD-10-CM

## 2012-02-29 DIAGNOSIS — I2510 Atherosclerotic heart disease of native coronary artery without angina pectoris: Principal | ICD-10-CM | POA: Diagnosis present

## 2012-02-29 DIAGNOSIS — I10 Essential (primary) hypertension: Secondary | ICD-10-CM | POA: Diagnosis present

## 2012-02-29 DIAGNOSIS — D62 Acute posthemorrhagic anemia: Secondary | ICD-10-CM | POA: Diagnosis not present

## 2012-02-29 DIAGNOSIS — E785 Hyperlipidemia, unspecified: Secondary | ICD-10-CM | POA: Diagnosis present

## 2012-02-29 DIAGNOSIS — D696 Thrombocytopenia, unspecified: Secondary | ICD-10-CM | POA: Diagnosis not present

## 2012-02-29 DIAGNOSIS — E041 Nontoxic single thyroid nodule: Secondary | ICD-10-CM

## 2012-02-29 DIAGNOSIS — Z87891 Personal history of nicotine dependence: Secondary | ICD-10-CM

## 2012-02-29 DIAGNOSIS — E8779 Other fluid overload: Secondary | ICD-10-CM | POA: Diagnosis not present

## 2012-02-29 DIAGNOSIS — Z7982 Long term (current) use of aspirin: Secondary | ICD-10-CM

## 2012-02-29 DIAGNOSIS — E669 Obesity, unspecified: Secondary | ICD-10-CM | POA: Diagnosis present

## 2012-02-29 DIAGNOSIS — K219 Gastro-esophageal reflux disease without esophagitis: Secondary | ICD-10-CM | POA: Diagnosis present

## 2012-02-29 DIAGNOSIS — Z9889 Other specified postprocedural states: Secondary | ICD-10-CM

## 2012-02-29 DIAGNOSIS — Z01810 Encounter for preprocedural cardiovascular examination: Secondary | ICD-10-CM

## 2012-02-29 DIAGNOSIS — I6529 Occlusion and stenosis of unspecified carotid artery: Secondary | ICD-10-CM | POA: Diagnosis present

## 2012-02-29 DIAGNOSIS — Z9861 Coronary angioplasty status: Secondary | ICD-10-CM

## 2012-02-29 DIAGNOSIS — Z79899 Other long term (current) drug therapy: Secondary | ICD-10-CM

## 2012-02-29 HISTORY — DX: Presence of aortocoronary bypass graft: Z95.1

## 2012-02-29 HISTORY — PX: CORONARY ARTERY BYPASS GRAFT: SHX141

## 2012-02-29 HISTORY — DX: Other specified postprocedural states: Z98.890

## 2012-02-29 HISTORY — PX: ENDARTERECTOMY: SHX5162

## 2012-02-29 LAB — CBC
HCT: 29.6 % — ABNORMAL LOW (ref 39.0–52.0)
Hemoglobin: 10.5 g/dL — ABNORMAL LOW (ref 13.0–17.0)
MCH: 30.9 pg (ref 26.0–34.0)
Platelets: 122 10*3/uL — ABNORMAL LOW (ref 150–400)
Platelets: 122 10*3/uL — ABNORMAL LOW (ref 150–400)
RBC: 3.33 MIL/uL — ABNORMAL LOW (ref 4.22–5.81)
RDW: 12.6 % (ref 11.5–15.5)
WBC: 12.9 10*3/uL — ABNORMAL HIGH (ref 4.0–10.5)

## 2012-02-29 LAB — POCT I-STAT, CHEM 8
BUN: 11 mg/dL (ref 6–23)
Creatinine, Ser: 1.1 mg/dL (ref 0.50–1.35)
Glucose, Bld: 116 mg/dL — ABNORMAL HIGH (ref 70–99)
Sodium: 141 mEq/L (ref 135–145)
TCO2: 21 mmol/L (ref 0–100)

## 2012-02-29 LAB — POCT I-STAT 3, ART BLOOD GAS (G3+)
Acid-base deficit: 2 mmol/L (ref 0.0–2.0)
Acid-base deficit: 3 mmol/L — ABNORMAL HIGH (ref 0.0–2.0)
Acid-base deficit: 1 mmol/L (ref 0.0–2.0)
Bicarbonate: 23.5 mEq/L (ref 20.0–24.0)
O2 Saturation: 98 %
O2 Saturation: 96 %
Patient temperature: 35.4
Patient temperature: 36.8
Patient temperature: 37.3
Patient temperature: 99.1
TCO2: 25 mmol/L (ref 0–100)
TCO2: 25 mmol/L (ref 0–100)
pH, Arterial: 7.38 (ref 7.350–7.450)
pH, Arterial: 7.436 (ref 7.350–7.450)
pH, Arterial: 7.381 (ref 7.350–7.450)
pH, Arterial: 7.405 (ref 7.350–7.450)

## 2012-02-29 LAB — POCT I-STAT 4, (NA,K, GLUC, HGB,HCT)
Glucose, Bld: 100 mg/dL — ABNORMAL HIGH (ref 70–99)
Glucose, Bld: 91 mg/dL (ref 70–99)
Glucose, Bld: 101 mg/dL — ABNORMAL HIGH (ref 70–99)
Glucose, Bld: 92 mg/dL (ref 70–99)
HCT: 25 % — ABNORMAL LOW (ref 39.0–52.0)
HCT: 33 % — ABNORMAL LOW (ref 39.0–52.0)
HCT: 26 % — ABNORMAL LOW (ref 39.0–52.0)
Hemoglobin: 11.2 g/dL — ABNORMAL LOW (ref 13.0–17.0)
Hemoglobin: 8.8 g/dL — ABNORMAL LOW (ref 13.0–17.0)
Hemoglobin: 10.2 g/dL — ABNORMAL LOW (ref 13.0–17.0)
Potassium: 4.3 mEq/L (ref 3.5–5.1)
Potassium: 4.7 mEq/L (ref 3.5–5.1)
Potassium: 3.6 mEq/L (ref 3.5–5.1)
Sodium: 140 mEq/L (ref 135–145)
Sodium: 137 mEq/L (ref 135–145)
Sodium: 140 mEq/L (ref 135–145)
Sodium: 142 mEq/L (ref 135–145)

## 2012-02-29 LAB — CREATININE, SERUM
GFR calc Af Amer: >90 mL/min (ref >90)
GFR calc non Af Amer: 84 mL/min — ABNORMAL LOW (ref >90)

## 2012-02-29 LAB — GLUCOSE, CAPILLARY
Glucose-Capillary: 123 mg/dL — ABNORMAL HIGH (ref 70–99)
Glucose-Capillary: 112 mg/dL — ABNORMAL HIGH (ref 70–99)

## 2012-02-29 LAB — PROTIME-INR
INR: 1.43 (ref 0.00–1.49)
Prothrombin Time: 17.7 seconds — ABNORMAL HIGH (ref 11.6–15.2)

## 2012-02-29 LAB — HEMOGLOBIN AND HEMATOCRIT, BLOOD
HCT: 26.4 % — ABNORMAL LOW (ref 39.0–52.0)
Hemoglobin: 9.4 g/dL — ABNORMAL LOW (ref 13.0–17.0)

## 2012-02-29 LAB — MAGNESIUM: Magnesium: 2.5 mg/dL (ref 1.5–2.5)

## 2012-02-29 LAB — APTT: aPTT: 34 seconds (ref 24–37)

## 2012-02-29 IMAGING — CR DG CHEST 1V PORT
1 series · 1 of 1 positions shown · non-contrast
Comparison: [DATE].

CLINICAL DATA: Postop CABG.

PORTABLE CHEST - 1 VIEW

[AP]
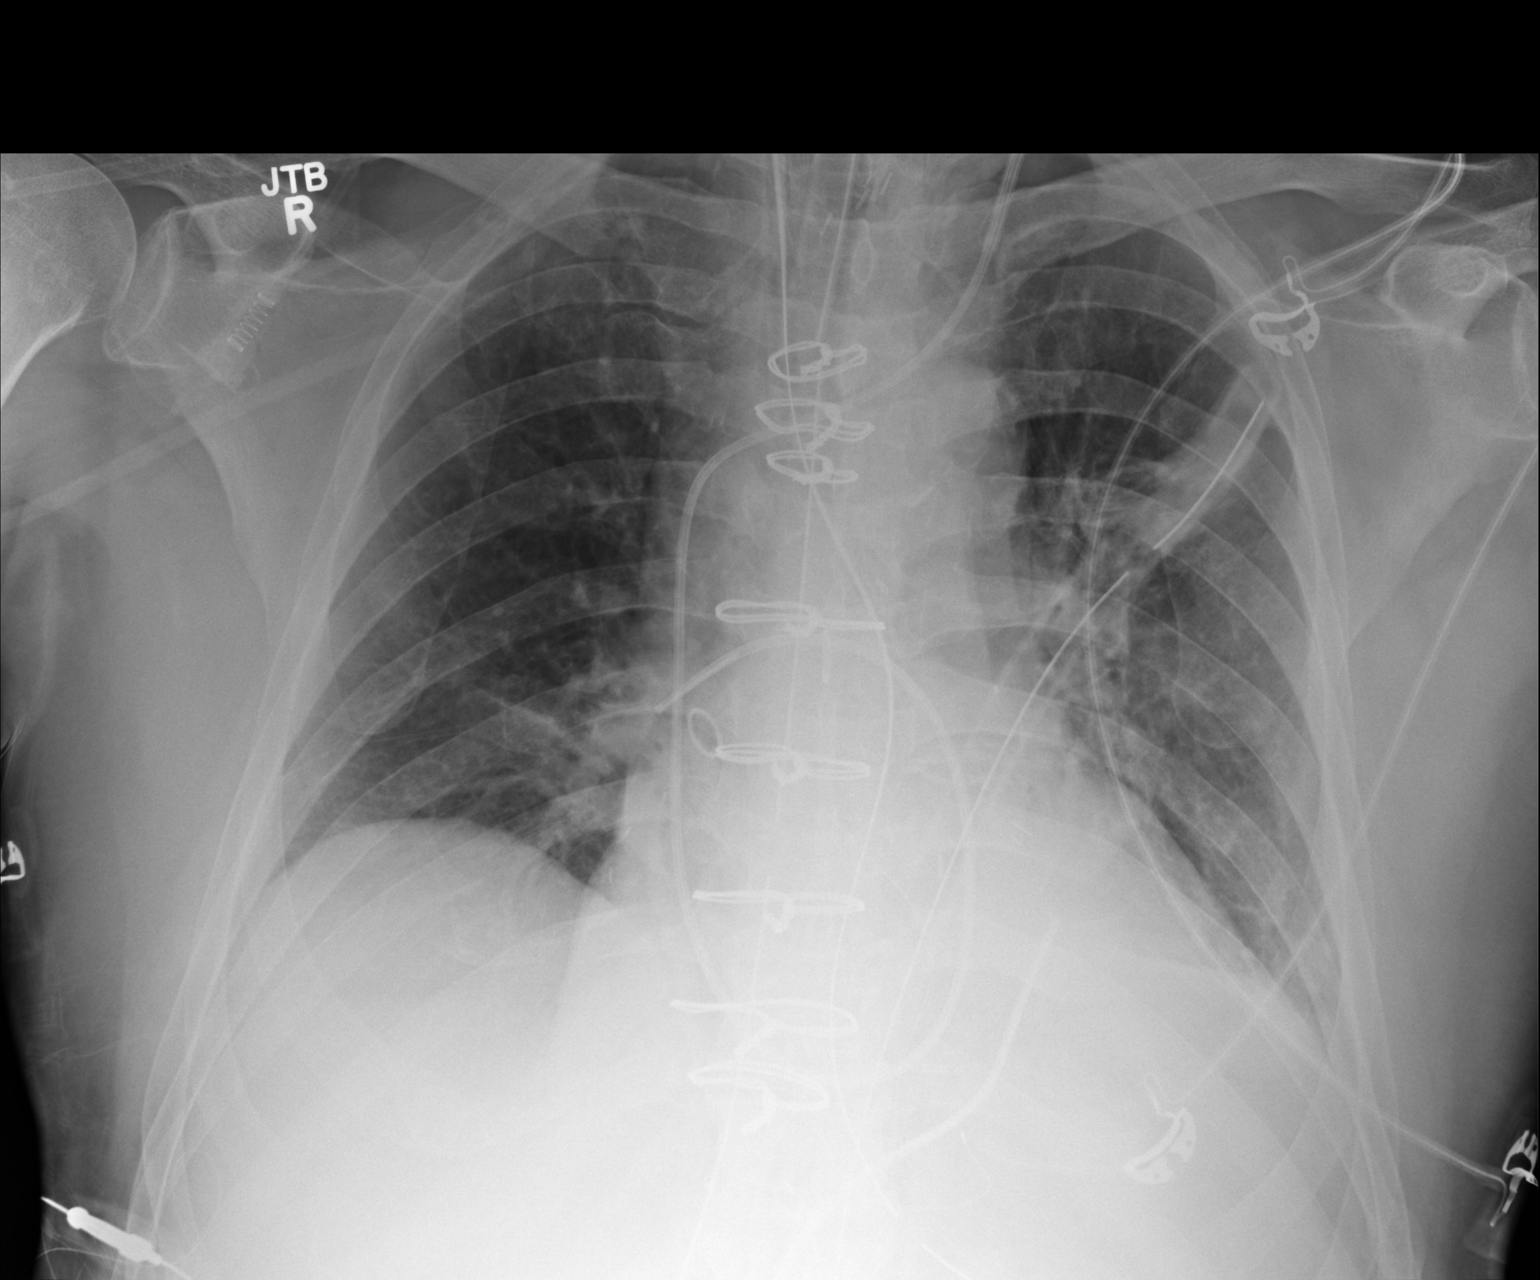

[1 of 1 positions shown; findings below may reference images not displayed]

FINDINGS: Endotracheal tube is in satisfactory position.  Left IJ
Swan-Ganz catheter tip projects over the right interlobar pulmonary
artery.  Nasogastric tube is followed into the stomach.
Mediastinal drains, left chest tube and cardiac pacer wires are in
place. Eight intact sternotomy wires.

Cardiomediastinal silhouette is accentuated by postoperative state
and AP semi upright technique, as well as low lung volumes.  Mild
perihilar and left basilar atelectasis.  No definite pleural fluid
or pneumothorax.
IMPRESSION: 1.  Postoperative changes of median sternotomy with support
apparatus as above.
2.  Bilateral perihilar and left basilar atelectasis.
3.  No definite pneumothorax.

## 2012-02-29 SURGERY — CORONARY ARTERY BYPASS GRAFTING (CABG)
Anesthesia: General | Site: Neck | Laterality: Right | Wound class: Clean

## 2012-02-29 MED ORDER — SIMVASTATIN 40 MG PO TABS
40.0000 mg | ORAL_TABLET | Freq: Every evening | ORAL | Status: DC
  Administered 2012-03-01: 40 mg via ORAL
  Filled 2012-02-29 (×3): qty 1

## 2012-02-29 MED ORDER — HEPARIN SODIUM (PORCINE) 1000 UNIT/ML IJ SOLN
INTRAMUSCULAR | Status: AC
  Filled 2012-02-29: qty 1

## 2012-02-29 MED ORDER — LEVOTHYROXINE SODIUM 125 MCG PO TABS
125.0000 ug | ORAL_TABLET | Freq: Every day | ORAL | Status: DC
  Administered 2012-03-01 – 2012-03-05 (×5): 125 ug via ORAL
  Filled 2012-02-29 (×5): qty 1

## 2012-02-29 MED ORDER — CALCIUM CHLORIDE 10 % IV SOLN
1.0000 g | Freq: Once | INTRAVENOUS | Status: AC | PRN
  Filled 2012-02-29: qty 10

## 2012-02-29 MED ORDER — ALBUMIN HUMAN 5 % IV SOLN
INTRAVENOUS | Status: AC
  Filled 2012-02-29: qty 250

## 2012-02-29 MED ORDER — DEXMEDETOMIDINE HCL IN NACL 200 MCG/50ML IV SOLN
0.1000 ug/kg/h | INTRAVENOUS | Status: DC
  Administered 2012-02-29 (×2): 0.7 ug/kg/h via INTRAVENOUS
  Filled 2012-02-29: qty 50

## 2012-02-29 MED ORDER — ACETAMINOPHEN 500 MG PO TABS
1000.0000 mg | ORAL_TABLET | Freq: Four times a day (QID) | ORAL | Status: DC
  Administered 2012-03-01 – 2012-03-05 (×13): 1000 mg via ORAL
  Filled 2012-02-29 (×19): qty 2

## 2012-02-29 MED ORDER — FAMOTIDINE IN NACL 20-0.9 MG/50ML-% IV SOLN
20.0000 mg | Freq: Two times a day (BID) | INTRAVENOUS | Status: DC
  Administered 2012-02-29: 20 mg via INTRAVENOUS

## 2012-02-29 MED ORDER — 0.9 % SODIUM CHLORIDE (POUR BTL) OPTIME
TOPICAL | Status: DC | PRN
  Administered 2012-02-29: 2000 mL

## 2012-02-29 MED ORDER — GLYCOPYRROLATE 0.2 MG/ML IJ SOLN
INTRAMUSCULAR | Status: DC | PRN
  Administered 2012-02-29: .2 mg via INTRAVENOUS
  Administered 2012-02-29: 0.1 mg via INTRAVENOUS

## 2012-02-29 MED ORDER — ALBUMIN HUMAN 5 % IV SOLN
INTRAVENOUS | Status: DC | PRN
  Administered 2012-02-29 (×2): via INTRAVENOUS

## 2012-02-29 MED ORDER — SODIUM BICARBONATE 8.4 % IV SOLN
INTRAVENOUS | Status: DC
  Filled 2012-02-29 (×2): qty 2.5

## 2012-02-29 MED ORDER — PROPOFOL 10 MG/ML IV EMUL
INTRAVENOUS | Status: DC | PRN
  Administered 2012-02-29: 30 mg via INTRAVENOUS

## 2012-02-29 MED ORDER — ASPIRIN 81 MG PO CHEW: 324.0000 mg | CHEWABLE_TABLET | Freq: Every day | ORAL | Status: DC

## 2012-02-29 MED ORDER — ACETAMINOPHEN 160 MG/5ML PO SOLN: 650.0000 mg | ORAL | Status: AC

## 2012-02-29 MED ORDER — SODIUM CHLORIDE 0.9 % IJ SOLN
OROMUCOSAL | Status: DC | PRN
  Administered 2012-02-29: 11:00:00 via TOPICAL

## 2012-02-29 MED ORDER — SODIUM CHLORIDE 0.9 % IJ SOLN: 3.0000 mL | INTRAMUSCULAR | Status: DC | PRN

## 2012-02-29 MED ORDER — MAGNESIUM SULFATE 40 MG/ML IJ SOLN
4.0000 g | Freq: Once | INTRAMUSCULAR | Status: AC
  Administered 2012-02-29: 4 g via INTRAVENOUS
  Filled 2012-02-29: qty 100

## 2012-02-29 MED ORDER — DEXTRAN 40 IN SALINE 10-0.9 % IV SOLN
INTRAVENOUS | Status: AC
  Filled 2012-02-29: qty 500

## 2012-02-29 MED ORDER — METOPROLOL TARTRATE 1 MG/ML IV SOLN: 2.5000 mg | INTRAVENOUS | Status: DC | PRN

## 2012-02-29 MED ORDER — ROCURONIUM BROMIDE 100 MG/10ML IV SOLN
INTRAVENOUS | Status: DC | PRN
  Administered 2012-02-29: 50 mg via INTRAVENOUS

## 2012-02-29 MED ORDER — DOCUSATE SODIUM 100 MG PO CAPS: 200.0000 mg | ORAL_CAPSULE | Freq: Every day | ORAL | Status: DC

## 2012-02-29 MED ORDER — VECURONIUM BROMIDE 10 MG IV SOLR
INTRAVENOUS | Status: DC | PRN
  Administered 2012-02-29 (×3): 10 mg via INTRAVENOUS

## 2012-02-29 MED ORDER — SODIUM CHLORIDE 0.9 % IV SOLN
INTRAVENOUS | Status: DC | PRN
  Administered 2012-02-29: 13:00:00 via INTRAVENOUS

## 2012-02-29 MED ORDER — INSULIN REGULAR BOLUS VIA INFUSION
0.0000 [IU] | Freq: Three times a day (TID) | INTRAVENOUS | Status: DC
  Filled 2012-02-29: qty 10

## 2012-02-29 MED ORDER — SODIUM CHLORIDE 0.9 % IJ SOLN: 3.0000 mL | Freq: Two times a day (BID) | INTRAMUSCULAR | Status: DC

## 2012-02-29 MED ORDER — DEXTRAN 40 IN SALINE 10-0.9 % IV SOLN
INTRAVENOUS | Status: DC | PRN
  Administered 2012-02-29: 500 mL

## 2012-02-29 MED ORDER — SODIUM CHLORIDE 0.9 % IR SOLN
Status: DC | PRN
  Administered 2012-02-29: 09:00:00

## 2012-02-29 MED ORDER — MORPHINE SULFATE 2 MG/ML IJ SOLN: 1.0000 mg | INTRAMUSCULAR | Status: AC | PRN

## 2012-02-29 MED ORDER — LIDOCAINE HCL (CARDIAC) 20 MG/ML IV SOLN
INTRAVENOUS | Status: DC | PRN
  Administered 2012-02-29: 40 mg via INTRAVENOUS

## 2012-02-29 MED ORDER — PHENYLEPHRINE HCL 10 MG/ML IJ SOLN
0.0000 ug/min | INTRAMUSCULAR | Status: DC
  Administered 2012-02-29: 10 ug/min via INTRAVENOUS
  Filled 2012-02-29 (×2): qty 2

## 2012-02-29 MED ORDER — PANTOPRAZOLE SODIUM 40 MG PO TBEC
40.0000 mg | DELAYED_RELEASE_TABLET | Freq: Every day | ORAL | Status: DC
  Administered 2012-03-02 – 2012-03-04 (×3): 40 mg via ORAL
  Filled 2012-02-29 (×3): qty 1

## 2012-02-29 MED ORDER — METOPROLOL TARTRATE 12.5 MG HALF TABLET
12.5000 mg | ORAL_TABLET | Freq: Two times a day (BID) | ORAL | Status: DC
  Filled 2012-02-29 (×3): qty 1

## 2012-02-29 MED ORDER — INSULIN ASPART 100 UNIT/ML ~~LOC~~ SOLN
0.0000 [IU] | SUBCUTANEOUS | Status: DC
  Administered 2012-03-01 (×2): 2 [IU] via SUBCUTANEOUS

## 2012-02-29 MED ORDER — SUFENTANIL CITRATE 50 MCG/ML IV SOLN
INTRAVENOUS | Status: DC | PRN
  Administered 2012-02-29: 5 ug via INTRAVENOUS
  Administered 2012-02-29: 10 ug via INTRAVENOUS
  Administered 2012-02-29: 20 ug via INTRAVENOUS
  Administered 2012-02-29: 65 ug via INTRAVENOUS
  Administered 2012-02-29 (×2): 20 ug via INTRAVENOUS

## 2012-02-29 MED ORDER — LACTATED RINGERS IV SOLN
INTRAVENOUS | Status: DC | PRN
  Administered 2012-02-29 (×4): via INTRAVENOUS

## 2012-02-29 MED ORDER — LIDOCAINE-EPINEPHRINE (PF) 1 %-1:200000 IJ SOLN
INTRAMUSCULAR | Status: AC
  Filled 2012-02-29: qty 10

## 2012-02-29 MED ORDER — ALBUMIN HUMAN 5 % IV SOLN
250.0000 mL | INTRAVENOUS | Status: DC | PRN
  Administered 2012-02-29 (×2): 250 mL via INTRAVENOUS

## 2012-02-29 MED ORDER — MIDAZOLAM HCL 5 MG/5ML IJ SOLN
INTRAMUSCULAR | Status: DC | PRN
  Administered 2012-02-29: 4 mg via INTRAVENOUS
  Administered 2012-02-29: 3 mg via INTRAVENOUS
  Administered 2012-02-29: 1 mg via INTRAVENOUS
  Administered 2012-02-29: 2 mg via INTRAVENOUS

## 2012-02-29 MED ORDER — LIDOCAINE HCL (CARDIAC) 20 MG/ML IV SOLN
INTRAVENOUS | Status: AC
  Filled 2012-02-29: qty 5

## 2012-02-29 MED ORDER — NIACIN 500 MG PO TABS
500.0000 mg | ORAL_TABLET | Freq: Every day | ORAL | Status: DC
  Administered 2012-03-01 – 2012-03-05 (×5): 500 mg via ORAL
  Filled 2012-02-29 (×6): qty 1

## 2012-02-29 MED ORDER — SODIUM CHLORIDE 0.9 % IV SOLN: 250.0000 mL | INTRAVENOUS | Status: DC

## 2012-02-29 MED ORDER — PROTAMINE SULFATE 10 MG/ML IV SOLN
INTRAVENOUS | Status: DC | PRN
  Administered 2012-02-29: 300 mg via INTRAVENOUS

## 2012-02-29 MED ORDER — MIDAZOLAM HCL 2 MG/2ML IJ SOLN: 2.0000 mg | INTRAMUSCULAR | Status: DC | PRN

## 2012-02-29 MED ORDER — INSULIN ASPART 100 UNIT/ML ~~LOC~~ SOLN
0.0000 [IU] | SUBCUTANEOUS | Status: AC
  Administered 2012-03-01: 2 [IU] via SUBCUTANEOUS

## 2012-02-29 MED ORDER — LACTATED RINGERS IV SOLN
INTRAVENOUS | Status: DC
  Administered 2012-02-29: 20 mL via INTRAVENOUS

## 2012-02-29 MED ORDER — SODIUM CHLORIDE 0.9 % IV SOLN
INTRAVENOUS | Status: DC
  Administered 2012-02-29: 20 mL via INTRAVENOUS

## 2012-02-29 MED ORDER — METOPROLOL TARTRATE 25 MG/10 ML ORAL SUSPENSION
12.5000 mg | Freq: Two times a day (BID) | ORAL | Status: DC
  Filled 2012-02-29 (×3): qty 5

## 2012-02-29 MED ORDER — HEMOSTATIC AGENTS (NO CHARGE) OPTIME
TOPICAL | Status: DC | PRN
  Administered 2012-02-29: 1 via TOPICAL

## 2012-02-29 MED ORDER — VANCOMYCIN HCL IN DEXTROSE 1-5 GM/200ML-% IV SOLN
1000.0000 mg | Freq: Once | INTRAVENOUS | Status: AC
  Administered 2012-02-29: 1000 mg via INTRAVENOUS
  Filled 2012-02-29: qty 200

## 2012-02-29 MED ORDER — BISACODYL 10 MG RE SUPP: 10.0000 mg | Freq: Every day | RECTAL | Status: DC

## 2012-02-29 MED ORDER — LIDOCAINE-EPINEPHRINE (PF) 1 %-1:200000 IJ SOLN
INTRAMUSCULAR | Status: DC | PRN
  Administered 2012-02-29: 30 mL

## 2012-02-29 MED ORDER — SODIUM CHLORIDE 0.45 % IV SOLN
INTRAVENOUS | Status: DC
  Administered 2012-02-29: 20 mL via INTRAVENOUS

## 2012-02-29 MED ORDER — BISACODYL 5 MG PO TBEC
10.0000 mg | DELAYED_RELEASE_TABLET | Freq: Every day | ORAL | Status: DC
  Administered 2012-03-01 – 2012-03-02 (×2): 10 mg via ORAL
  Filled 2012-02-29 (×2): qty 2

## 2012-02-29 MED ORDER — ACETAMINOPHEN 650 MG RE SUPP
650.0000 mg | RECTAL | Status: AC
  Administered 2012-02-29: 650 mg via RECTAL

## 2012-02-29 MED ORDER — DEXTROSE 5 % IV SOLN
1.5000 g | Freq: Two times a day (BID) | INTRAVENOUS | Status: DC
  Administered 2012-03-01: 1.5 g via INTRAVENOUS
  Filled 2012-02-29 (×3): qty 1.5

## 2012-02-29 MED ORDER — SODIUM CHLORIDE 0.9 % IV SOLN
INTRAVENOUS | Status: DC
  Administered 2012-02-29: 1 [IU]/h via INTRAVENOUS
  Administered 2012-02-29: 0.9 [IU]/h via INTRAVENOUS
  Administered 2012-02-29: 0.8 [IU]/h via INTRAVENOUS
  Filled 2012-02-29: qty 1

## 2012-02-29 MED ORDER — ASPIRIN EC 325 MG PO TBEC
325.0000 mg | DELAYED_RELEASE_TABLET | Freq: Every day | ORAL | Status: DC
  Administered 2012-03-01 – 2012-03-05 (×5): 325 mg via ORAL
  Filled 2012-02-29 (×5): qty 1

## 2012-02-29 MED ORDER — HEPARIN SODIUM (PORCINE) 1000 UNIT/ML IJ SOLN
INTRAMUSCULAR | Status: DC | PRN
  Administered 2012-02-29: 30000 [IU] via INTRAVENOUS
  Administered 2012-02-29: 9000 [IU] via INTRAVENOUS

## 2012-02-29 MED ORDER — ACETAMINOPHEN 160 MG/5ML PO SOLN: 975.0000 mg | Freq: Four times a day (QID) | ORAL | Status: DC

## 2012-02-29 MED ORDER — MORPHINE SULFATE 2 MG/ML IJ SOLN
2.0000 mg | INTRAMUSCULAR | Status: DC | PRN
  Administered 2012-02-29: 4 mg via INTRAVENOUS
  Administered 2012-03-01 (×2): 2 mg via INTRAVENOUS
  Administered 2012-03-01: 4 mg via INTRAVENOUS
  Filled 2012-02-29 (×2): qty 2
  Filled 2012-02-29 (×2): qty 1

## 2012-02-29 MED ORDER — ONDANSETRON HCL 4 MG/2ML IJ SOLN: 4.0000 mg | Freq: Four times a day (QID) | INTRAMUSCULAR | Status: DC | PRN

## 2012-02-29 MED ORDER — OXYCODONE HCL 5 MG PO TABS
5.0000 mg | ORAL_TABLET | ORAL | Status: DC | PRN
  Administered 2012-03-01 – 2012-03-02 (×4): 10 mg via ORAL
  Filled 2012-02-29 (×4): qty 2

## 2012-02-29 MED ORDER — POTASSIUM CHLORIDE 10 MEQ/50ML IV SOLN
10.0000 meq | INTRAVENOUS | Status: AC
  Administered 2012-02-29 (×3): 10 meq via INTRAVENOUS

## 2012-02-29 MED ORDER — SODIUM BICARBONATE 8.4 % IV SOLN
INTRAVENOUS | Status: AC
  Filled 2012-02-29: qty 50

## 2012-02-29 MED ORDER — NITROGLYCERIN IN D5W 200-5 MCG/ML-% IV SOLN: 0.0000 ug/min | INTRAVENOUS | Status: DC

## 2012-02-29 SURGICAL SUPPLY — 148 items
ADAPTER CARDIO PERF ANTE/RETRO (ADAPTER) IMPLANT
ADH SKN CLS APL DERMABOND .7 (GAUZE/BANDAGES/DRESSINGS)
ADPR PRFSN 84XANTGRD RTRGD (ADAPTER)
APL SKNCLS STERI-STRIP NONHPOA (GAUZE/BANDAGES/DRESSINGS)
APPLIER CLIP 9.375 MED OPEN (MISCELLANEOUS)
APPLIER CLIP 9.375 SM OPEN (CLIP) ×3
APR CLP MED 9.3 20 MLT OPN (MISCELLANEOUS)
APR CLP SM 9.3 20 MLT OPN (CLIP) ×2
ATTRACTOMAT 16X20 MAGNETIC DRP (DRAPES) ×3 IMPLANT
BAG DECANTER FOR FLEXI CONT (MISCELLANEOUS) ×7 IMPLANT
BANDAGE ELASTIC 4 VELCRO ST LF (GAUZE/BANDAGES/DRESSINGS) ×3 IMPLANT
BANDAGE ELASTIC 6 VELCRO ST LF (GAUZE/BANDAGES/DRESSINGS) ×3 IMPLANT
BANDAGE GAUZE ELAST BULKY 4 IN (GAUZE/BANDAGES/DRESSINGS) ×3 IMPLANT
BASKET HEART (ORDER IN 25'S) (MISCELLANEOUS) ×1
BASKET HEART (ORDER IN 25S) (MISCELLANEOUS) ×2 IMPLANT
BENZOIN TINCTURE PRP APPL 2/3 (GAUZE/BANDAGES/DRESSINGS) ×2 IMPLANT
BLADE STERNUM SYSTEM 6 (BLADE) ×3 IMPLANT
BLADE SURG 11 STRL SS (BLADE) ×1 IMPLANT
BLADE SURG ROTATE 9660 (MISCELLANEOUS) ×1 IMPLANT
CANISTER SUCTION 2500CC (MISCELLANEOUS) ×6 IMPLANT
CANNULA GUNDRY RCSP 15FR (MISCELLANEOUS) IMPLANT
CANNULA VESSEL W/WING WO/VALVE (CANNULA) ×2 IMPLANT
CATH CPB KIT OWEN (MISCELLANEOUS) ×3 IMPLANT
CATH ROBINSON RED A/P 18FR (CATHETERS) ×6 IMPLANT
CATH THORACIC 28FR (CATHETERS) IMPLANT
CATH THORACIC 28FR RT ANG (CATHETERS) IMPLANT
CATH THORACIC 36FR (CATHETERS) ×3 IMPLANT
CATH THORACIC 36FR RT ANG (CATHETERS) ×3 IMPLANT
CLIP APPLIE 9.375 MED OPEN (MISCELLANEOUS) IMPLANT
CLIP APPLIE 9.375 SM OPEN (CLIP) IMPLANT
CLIP FOGARTY SPRING 6M (CLIP) IMPLANT
CLIP RETRACTION 3.0MM CORONARY (MISCELLANEOUS) ×1 IMPLANT
CLIP TI MEDIUM 24 (CLIP) ×3 IMPLANT
CLIP TI WIDE RED SMALL 24 (CLIP) ×3 IMPLANT
CLOTH BEACON ORANGE TIMEOUT ST (SAFETY) ×6 IMPLANT
CONN Y 3/8X3/8X3/8  BEN (MISCELLANEOUS)
CONN Y 3/8X3/8X3/8 BEN (MISCELLANEOUS) IMPLANT
COVER SURGICAL LIGHT HANDLE (MISCELLANEOUS) ×9 IMPLANT
CRADLE DONUT ADULT HEAD (MISCELLANEOUS) ×5 IMPLANT
DERMABOND ADVANCED (GAUZE/BANDAGES/DRESSINGS)
DERMABOND ADVANCED .7 DNX12 (GAUZE/BANDAGES/DRESSINGS) ×2 IMPLANT
DRAIN CHANNEL 15F RND FF W/TCR (WOUND CARE) ×1 IMPLANT
DRAIN CHANNEL 32F RND 10.7 FF (WOUND CARE) ×3 IMPLANT
DRAPE CARDIOVASCULAR INCISE (DRAPES) ×3
DRAPE SLUSH/WARMER DISC (DRAPES) ×3 IMPLANT
DRAPE SRG 135X102X78XABS (DRAPES) ×2 IMPLANT
DRAPE WARM FLUID 44X44 (DRAPE) ×3 IMPLANT
DRSG COVADERM 4X14 (GAUZE/BANDAGES/DRESSINGS) ×3 IMPLANT
DRSG COVADERM 4X8 (GAUZE/BANDAGES/DRESSINGS) ×1 IMPLANT
ELECT REM PT RETURN 9FT ADLT (ELECTROSURGICAL) ×9
ELECTRODE REM PT RTRN 9FT ADLT (ELECTROSURGICAL) ×6 IMPLANT
EVACUATOR SILICONE 100CC (DRAIN) ×1 IMPLANT
EZ GLIDE AORTIC CANNULA 21FR 7MM ×1 IMPLANT
GAUZE SPONGE 4X4 16PLY XRAY LF (GAUZE/BANDAGES/DRESSINGS) ×1 IMPLANT
GLOVE BIO SURGEON STRL SZ 6 (GLOVE) ×6 IMPLANT
GLOVE BIO SURGEON STRL SZ 6.5 (GLOVE) ×5 IMPLANT
GLOVE BIO SURGEON STRL SZ7 (GLOVE) IMPLANT
GLOVE BIO SURGEON STRL SZ7.5 (GLOVE) ×3 IMPLANT
GLOVE BIOGEL PI IND STRL 6 (GLOVE) IMPLANT
GLOVE BIOGEL PI IND STRL 6.5 (GLOVE) IMPLANT
GLOVE BIOGEL PI IND STRL 7.0 (GLOVE) IMPLANT
GLOVE BIOGEL PI IND STRL 7.5 (GLOVE) ×2 IMPLANT
GLOVE BIOGEL PI INDICATOR 6 (GLOVE)
GLOVE BIOGEL PI INDICATOR 6.5 (GLOVE) ×3
GLOVE BIOGEL PI INDICATOR 7.0 (GLOVE) ×1
GLOVE BIOGEL PI INDICATOR 7.5 (GLOVE) ×1
GLOVE EUDERMIC 7 POWDERFREE (GLOVE) IMPLANT
GLOVE ORTHO TXT STRL SZ7.5 (GLOVE) ×6 IMPLANT
GOWN STRL NON-REIN LRG LVL3 (GOWN DISPOSABLE) ×21 IMPLANT
HEMOSTAT POWDER SURGIFOAM 1G (HEMOSTASIS) ×9 IMPLANT
HEMOSTAT SURGICEL 2X14 (HEMOSTASIS) ×1 IMPLANT
INSERT FOGARTY 61MM (MISCELLANEOUS) IMPLANT
INSERT FOGARTY XLG (MISCELLANEOUS) ×3 IMPLANT
KIT BASIN OR (CUSTOM PROCEDURE TRAY) ×5 IMPLANT
KIT ROOM TURNOVER OR (KITS) ×6 IMPLANT
KIT SUCTION CATH 14FR (SUCTIONS) ×15 IMPLANT
KIT VASOVIEW W/TROCAR VH 2000 (KITS) ×3 IMPLANT
LEAD PACING MYOCARDI (MISCELLANEOUS) ×3 IMPLANT
MARKER GRAFT CORONARY BYPASS (MISCELLANEOUS) ×9 IMPLANT
NDL HYPO 25X1 1.5 SAFETY (NEEDLE) ×2 IMPLANT
NEEDLE HYPO 25X1 1.5 SAFETY (NEEDLE) ×3 IMPLANT
NS IRRIG 1000ML POUR BTL (IV SOLUTION) ×21 IMPLANT
PACK CAROTID (CUSTOM PROCEDURE TRAY) ×3 IMPLANT
PACK OPEN HEART (CUSTOM PROCEDURE TRAY) ×3 IMPLANT
PAD ARMBOARD 7.5X6 YLW CONV (MISCELLANEOUS) ×9 IMPLANT
PENCIL BUTTON HOLSTER BLD 10FT (ELECTRODE) ×3 IMPLANT
PUNCH AORTIC ROTATE 4.0MM (MISCELLANEOUS) IMPLANT
PUNCH AORTIC ROTATE 4.5MM 8IN (MISCELLANEOUS) ×1 IMPLANT
PUNCH AORTIC ROTATE 5MM 8IN (MISCELLANEOUS) IMPLANT
SET CARDIOPLEGIA MPS 5001102 (MISCELLANEOUS) ×1 IMPLANT
SHUNT CAROTID BYPASS 10 (VASCULAR PRODUCTS) IMPLANT
SHUNT CAROTID BYPASS 12 (VASCULAR PRODUCTS) ×1 IMPLANT
SHUNT CAROTID BYPASS 12FRX15.5 (VASCULAR PRODUCTS) IMPLANT
SOLUTION ANTI FOG 6CC (MISCELLANEOUS) ×1 IMPLANT
SPECIMEN JAR SMALL (MISCELLANEOUS) ×3 IMPLANT
SPONGE GAUZE 4X4 12PLY (GAUZE/BANDAGES/DRESSINGS) ×6 IMPLANT
SPONGE LAP 18X18 X RAY DECT (DISPOSABLE) ×1 IMPLANT
SPONGE LAP 4X18 X RAY DECT (DISPOSABLE) IMPLANT
SPONGE SURGIFOAM ABS GEL 100 (HEMOSTASIS) ×3 IMPLANT
SUT BONE WAX W31G (SUTURE) ×3 IMPLANT
SUT ETHIBOND X763 2 0 SH 1 (SUTURE) ×6 IMPLANT
SUT ETHILON 3 0 PS 1 (SUTURE) ×2 IMPLANT
SUT MNCRL AB 3-0 PS2 18 (SUTURE) ×6 IMPLANT
SUT MNCRL AB 4-0 PS2 18 (SUTURE) ×2 IMPLANT
SUT PDS AB 1 CTX 36 (SUTURE) ×6 IMPLANT
SUT PROLENE 2 0 SH DA (SUTURE) IMPLANT
SUT PROLENE 3 0 SH DA (SUTURE) ×3 IMPLANT
SUT PROLENE 3 0 SH1 36 (SUTURE) IMPLANT
SUT PROLENE 4 0 RB 1 (SUTURE)
SUT PROLENE 4 0 SH DA (SUTURE) IMPLANT
SUT PROLENE 4-0 RB1 .5 CRCL 36 (SUTURE) IMPLANT
SUT PROLENE 5 0 C 1 36 (SUTURE) IMPLANT
SUT PROLENE 6 0 BV (SUTURE) ×5 IMPLANT
SUT PROLENE 6 0 C 1 30 (SUTURE) ×3 IMPLANT
SUT PROLENE 7 0 BV 1 (SUTURE) IMPLANT
SUT PROLENE 7.0 RB 3 (SUTURE) ×13 IMPLANT
SUT PROLENE 8 0 BV175 6 (SUTURE) IMPLANT
SUT PROLENE BLUE 7 0 (SUTURE) ×3 IMPLANT
SUT PROLENE POLY MONO (SUTURE) IMPLANT
SUT SILK  1 MH (SUTURE) ×1
SUT SILK 1 MH (SUTURE) ×2 IMPLANT
SUT SILK 2 0 FS (SUTURE) IMPLANT
SUT STEEL 6MS V (SUTURE) IMPLANT
SUT STEEL STERNAL CCS#1 18IN (SUTURE) IMPLANT
SUT STEEL SZ 6 DBL 3X14 BALL (SUTURE) ×3 IMPLANT
SUT VIC AB 1 CTX 36 (SUTURE)
SUT VIC AB 1 CTX36XBRD ANBCTR (SUTURE) IMPLANT
SUT VIC AB 2-0 CT1 27 (SUTURE) ×6
SUT VIC AB 2-0 CT1 TAPERPNT 27 (SUTURE) IMPLANT
SUT VIC AB 2-0 CTX 27 (SUTURE) IMPLANT
SUT VIC AB 3-0 SH 27 (SUTURE) ×3
SUT VIC AB 3-0 SH 27X BRD (SUTURE) ×2 IMPLANT
SUT VIC AB 3-0 X1 27 (SUTURE) IMPLANT
SUT VICRYL 4-0 PS2 18IN ABS (SUTURE) ×4 IMPLANT
SUTURE E-PAK OPEN HEART (SUTURE) ×3 IMPLANT
SYR 20ML ECCENTRIC (SYRINGE) ×2 IMPLANT
SYR CONTROL 10ML LL (SYRINGE) ×3 IMPLANT
SYSTEM SAHARA CHEST DRAIN ATS (WOUND CARE) ×3 IMPLANT
TAPE CLOTH SURG 4X10 WHT LF (GAUZE/BANDAGES/DRESSINGS) ×1 IMPLANT
TAPES RETRACTO (MISCELLANEOUS) ×1 IMPLANT
TOWEL OR 17X24 6PK STRL BLUE (TOWEL DISPOSABLE) ×9 IMPLANT
TOWEL OR 17X26 10 PK STRL BLUE (TOWEL DISPOSABLE) ×9 IMPLANT
TRAY FOLEY CATH 14FRSI W/METER (CATHETERS) ×3 IMPLANT
TRAY FOLEY IC TEMP SENS 14FR (CATHETERS) ×3 IMPLANT
TUBE SUCT INTRACARD DLP 20F (MISCELLANEOUS) ×3 IMPLANT
TUBING INSUFFLATION 10FT LAP (TUBING) ×3 IMPLANT
UNDERPAD 30X30 INCONTINENT (UNDERPADS AND DIAPERS) ×3 IMPLANT
WATER STERILE IRR 1000ML POUR (IV SOLUTION) ×9 IMPLANT

## 2012-02-29 NOTE — Progress Notes (Signed)
VASCULAR PROGRESS NOTE  SUBJECTIVE: Arousable  PHYSICAL EXAM: Filed Vitals:   02/29/12 1653 02/29/12 1700 02/29/12 1715 02/29/12 1730  BP:  101/66    Pulse: 80 79 79 79  Temp:  97.9 F (36.6 C) 98.1 F (36.7 C) 98.2 F (36.8 C)  TempSrc:  Core (Comment) Core (Comment) Core (Comment)  Resp: 4 14 12  0  Height:      Weight:      SpO2: 100% 100% 100% 100%   Moves all ext.  Neck incision ok  ASSESSMENT/PLAN: Neuro intact post op  Waverly Ferrari, MD, FACS Beeper: [phone] 02/29/2012

## 2012-02-29 NOTE — Procedures (Signed)
Extubation Procedure Note  Patient Details:   Name: [name] [name] DOB: 27-Apr-1941 MRN: [id number]   Airway Documentation:     Evaluation  O2 sats: stable throughout Complications: No apparent complications Patient did tolerate procedure well. Bilateral Breath Sounds: Other (Comment) (coarse)   Yes  Pt extubated at this time per SICU rapid wean protocol and tolerated well. No complications noted. No stridor noted. Pt able to breathe around deflated cuff. VC 1.0 L, NIF -20cm H2O, IS of . VS stable. Pt able to vocalize and has strong adequate cough. RT will continue to monitor.   Loyal Jacobson [facility] 02/29/2012, 6:02 PM

## 2012-02-29 NOTE — Transfer of Care (Signed)
Immediate Anesthesia Transfer of Care Note  Patient: [name] [name]  Procedure(s) Performed: Procedure(s) (LRB): CORONARY ARTERY BYPASS GRAFTING (CABG) (N/A) ENDARTERECTOMY CAROTID (Right)  Patient Location: SICU  Anesthesia Type: General  Level of Consciousness: Patient remains intubated per anesthesia plan  Airway & Oxygen Therapy: Patient remains intubated per anesthesia plan and Patient placed on Ventilator (see vital sign flow sheet for setting)  Post-op Assessment: Post -op Vital signs reviewed and stable  Post vital signs: Reviewed and stable  Complications: No apparent anesthesia complications

## 2012-02-29 NOTE — Anesthesia Preprocedure Evaluation (Addendum)
Anesthesia Evaluation  Patient identified by MRN, date of birth, ID band Patient awake    Reviewed: Allergy & Precautions, H&P , NPO status , Patient's Chart, lab work & pertinent test results, reviewed documented beta blocker date and time   History of Anesthesia Complications Negative for: history of anesthetic complications  Airway Mallampati: I TM Distance: >3 FB Neck ROM: Full    Dental No notable dental hx. (+) Dental Advisory Given and Teeth Intact   Pulmonary shortness of breath and with exertion, sleep apnea and Continuous Positive Airway Pressure Ventilation , former smoker (quit 10 years),  breath sounds clear to auscultation  Pulmonary exam normal       Cardiovascular hypertension, Pt. 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)   Renal/GU      Musculoskeletal   Abdominal (+) + obese,   Peds  Hematology negative hematology ROS (+)   Anesthesia Other Findings   Reproductive/Obstetrics                        Anesthesia Physical Anesthesia Plan  ASA: III  Anesthesia Plan: General   Post-op Pain Management:    Induction: Intravenous  Airway Management Planned: Oral ETT  Additional Equipment: Arterial line, PA Cath and TEE  Intra-op Plan:   Post-operative Plan: Post-operative intubation/ventilation  Informed Consent: I have reviewed the patients History and Physical, chart, labs and discussed the procedure including the risks, benefits and alternatives for the proposed anesthesia with the patient or authorized representative who has indicated his/her understanding and acceptance.   Dental advisory given  Plan  Discussed with: CRNA and Surgeon  Anesthesia Plan Comments: (Plan routine monitors, A line, PA catheter, GETA with TEE and post op ventilation)        Anesthesia Quick Evaluation

## 2012-02-29 NOTE — Op Note (Addendum)
CARDIOTHORACIC SURGERY OPERATIVE NOTE  Date of Procedure: 02/29/2012  Preoperative Diagnosis:   Severe 3-vessel Coronary Artery Disease  Severe Right Internal Carotid Artery Stenosis  Postoperative Diagnosis:   Same  Procedure:   Coronary Artery Bypass Grafting x 3   Left Internal Mammary Artery to Distal Left Anterior Descending Coronary Artery  Saphenous Vein Graft to Posterior Descending Coronary Artery  Saphenous Vein Graft to Second Obtuse Marginal Branch of Left Circumflex Coronary Artery  Endoscopic Vein Harvest from Right Thigh and Lower Leg  Surgeon: [name] [name]. [name] Moras, MD  Assistant: Lowella Dandy, PA-C  Anesthesia: [name] Pomfret, MD  Operative Findings:  nomral left ventricular systolic function  good quality left internal mammary artery conduit  good quality saphenous vein conduit  good quality target vessels for grafting    BRIEF CLINICAL NOTE AND INDICATIONS FOR SURGERY  Patient is a 71 year old gentleman with known history of coronary artery disease, hypertension, hyperlipidemia, asymptomatic severe right carotid artery stenosis, and obstructive sleep apnea. The patient's cardiac history dates back to 1997 at which time he suffered an acute myocardial infarction. He was treated with PCI and stenting at [facility] in [place]. Details of that event are not available. The patient has done well from a cardiovascular standpoint until recently. Approximately 2 years ago he had a syncopal episode. He went back to see his cardiologist in [place] who did not perform a diagnostic tests but subsequently referred him to a neurologist. He apparently had several tests performed including MRI of the brain, but he never had any followup from the neurologists nor any other physicians in [place]. 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 The patient ultimately underwent thyroidectomy hearing  [place]. More recently the patient has been followed by Dr. Edilia Bo due to the presence of known asymptomatic cerebrovascular disease with bilateral internal carotid artery stenosis. Most recent carotid duplex scan demonstrates severe (80-99%) right carotid artery stenosis. He has been evaluated for possible elective right carotid endarterectomy. During this time the patient reports that he has developed exertional shortness of breath with increased fatigue and decreased energy. He asked his primary care physician to refer him to a new cardiologist and he was seen in consultation by Dr. Alanda Amass.  And exercise Myoview scan was performed demonstrating high-risk findings with 2 mm ST segment depression on electrocardiogram and lateral wall ischemia on nuclear imaging. The patient subsequently underwent elective cardiac catheterization by Dr. Tresa Endo on July 9. He was found to have left main disease with three-vessel coronary artery disease. The patient was referred for possible surgical revascularization.  The patient has been seen in consultation and counseled at length regarding the indications, risks and potential benefits of surgery.  All questions have been answered, and the patient provides full informed consent for the operation as described.     DETAILS OF THE OPERATIVE PROCEDURE  The patient is brought to the operating room on the above mentioned date and central monitoring was established by the anesthesia team including placement of Swan-Ganz catheter and radial arterial line. The patient is placed in the supine position on the operating table.  Intravenous antibiotics are administered. General endotracheal anesthesia is induced uneventfully. A Foley catheter is placed.  Baseline transesophageal echocardiogram was performed.  Findings were notable for normal LV size and systolic function.  The patient's right neck, chest, abdomen, both groins, and both lower extremities are prepared and draped  in a sterile manner.   Right carotid endarterectomy is performed  by Dr. Waverly Ferrari for asymptomatic severe right internal carotid artery stenosis. Details of this procedure are provided in a separate operative note. After completion of right carotid endarterectomy, the incision in the right neck was reapproximated loosely over 2 surgical sponges.  A time out procedure is performed.  A median sternotomy incision was performed and the left internal mammary artery is dissected from the chest wall and prepared for bypass grafting. The left internal mammary artery is notably good quality conduit. Simultaneously, saphenous vein is obtained from the patient's right thigh using endoscopic vein harvest technique. The saphenous vein is notably good quality conduit. After removal of the saphenous vein, the small surgical incisions in the lower extremity are closed with absorbable suture. Following systemic heparinization, the left internal mammary artery was transected distally noted to have excellent flow.  The pericardium is opened. The ascending aorta is normal in appearance. The ascending aorta and the right atrium are cannulated for cardioplegia bypass.  Adequate heparinization is verified.    The entire pre-bypass portion of the operation was notable for stable hemodynamics.  Cardiopulmonary bypass was begun and the surface of the heart is inspected. Distal target vessels are selected for coronary artery bypass grafting. A cardioplegia cannula is placed in the ascending aorta.  A temperature probe was placed in the interventricular septum.  The patient is allowed to cool passively to [facility] systemic temperature.  The aortic cross clamp is applied and cold blood cardioplegia is delivered initially in an antegrade fashion through the aortic root.   Iced saline slush is applied for topical hypothermia.  The initial cardioplegic arrest is rapid with early diastolic arrest.  Repeat doses of cardioplegia are  administered intermittently throughout the entire cross clamp portion of the operation through the aortic root and through subsequently placed vein grafts in order to maintain completely flat electrocardiogram and septal myocardial temperature below 15C.  Myocardial protection was felt to be excellent.  The following distal coronary artery bypass grafts were performed:   The posterior descending branch of the right coronary artery was grafted using a reversed saphenous vein graft in an end-to-side fashion.  At the site of distal anastomosis the target vessel was good quality and measured approximately 2.0 mm in diameter.  The second obtuse marginal branch of the left circumflex coronary artery was grafted using a reversed saphenous vein graft in an end-to-side fashion.  At the site of distal anastomosis the target vessel was good quality and measured approximately 2.0 mm in diameter.  The distal left anterior coronary artery was grafted with the left internal mammary artery in an end-to-side fashion.  At the site of distal anastomosis the target vessel was good quality and measured approximately 1.8 mm in diameter.   All proximal vein graft anastomoses were placed directly to the ascending aorta prior to removal of the aortic cross clamp.  The septal myocardial temperature rose rapidly after reperfusion of the left internal mammary artery graft.  The aortic cross clamp was removed after a total cross clamp time of 57 minutes.  All proximal and distal coronary anastomoses were inspected for hemostasis and appropriate graft orientation. Epicardial pacing wires are fixed to the right ventricular outflow tract and to the right atrial appendage. The patient is rewarmed to 37C temperature. The patient is weaned and disconnected from cardiopulmonary bypass.  The patient's rhythm at separation from bypass was sinus rhythm.  The patient was weaned from cardioplegic bypass without any inotropic support. Total  cardiopulmonary bypass time for the operation  was 78 minutes.  Followup transesophageal echocardiogram performed after separation from bypass revealed no changes from the preoperative exam.  The aortic and venous cannula were removed uneventfully. Protamine was administered to reverse the anticoagulation. The mediastinum and pleural space were inspected for hemostasis and irrigated with saline solution. The mediastinum and the left pleural space were drained using 3 chest tubes placed through separate stab incisions inferiorly.  The soft tissues anterior to the aorta were reapproximated loosely. The sternum is closed with double strength sternal wire. The soft tissues anterior to the sternum were closed in multiple layers and the skin is closed with a running subcuticular skin closure.  The right neck incision was reopened and the surgical sponges removed. Right neck wound was inspected for hemostasis. The right neck wound was drained using a #15 Blake drain placed through separate stab incision inferiorly. The right neck incision is closed in multiple layers with absorbable suture.  The post-bypass portion of the operation was notable for stable rhythm and hemodynamics.  No blood products were administered during the operation.  The patient tolerated the procedure well and is transported to the surgical intensive care in stable condition. There are no intraoperative complications. All sponge instrument and needle counts are verified correct at completion of the operation.    [name] [name]. [name] Moras MD 02/29/2012 2:46 PM

## 2012-02-29 NOTE — Progress Notes (Signed)
Patient ID: [name] [name], male   DOB: Aug 19, 1940, 71 y.o.   MRN: [id number]  Filed Vitals:   02/29/12 1515 02/29/12 1530 02/29/12 1545 02/29/12 1600  BP:    104/75  Pulse: 80 79 80 85  Temp: 96.3 F (35.7 C) 96.4 F (35.8 C) 96.8 F (36 C) 97.2 F (36.2 C)  TempSrc: Core (Comment) Core (Comment) Core (Comment) Core (Comment)  Resp: 12 12 12 16   Height:      Weight:      SpO2: 100% 100% 100% 100%   CI=2.3  Waking up on vent  Urine output good  CT output low  6hr labs pending

## 2012-02-29 NOTE — Telephone Encounter (Addendum)
Message copied by Rosalyn Charters on Thu Feb 29, 2012  4:09 PM ------      Message from: Melene Plan      Created: Thu Feb 29, 2012  2:57 PM      Regarding: FW: charge and follow up                   ----- Message -----         From: Chuck Hint, MD         Sent: 02/29/2012   2:36 PM           To: Reuel Derby, Melene Plan, RN      Subject: charge and follow up                                     PROCEDURE: right carotid endarterectomy with primary closure            SURGEON: Di Kindle. Edilia Bo, MD, FACS            ASSIST: Della Goo PA            The patient will need a follow up visit in 3-4 weeks. Thank you. CSD  NOTIFIED PATIENT OF FU APPT WITH DR. DICKSON ON 03-20-12 2:45 PM

## 2012-02-29 NOTE — Preoperative (Signed)
Beta Blockers   Reason not to administer Beta Blockers:Not Applicable 

## 2012-02-29 NOTE — Interval H&P Note (Signed)
History and Physical Interval Note:  02/29/2012 7:24 AM  [name] [name]  has presented today for surgery, with the diagnosis of CAD and carotid artery disease. He had originally been considered for right carotid endarterectomy but underwent a preoperative stress test which showed significant coronary artery disease. I discussed with him the option of proceeding with combined right carotid endarterectomy along with CABG and he was agreeable with this plan.  The various methods of treatment have been discussed with the patient and family. After consideration of risks, benefits and other options for treatment, the patient has consented to: RIGHT CAROTID ENDARTERECTOMY.  The patient's history has been reviewed, patient examined, no change in status, stable for surgery.  I have reviewed the patients' chart and labs.  Questions were answered to the patient's satisfaction.     Debbora Ang S

## 2012-02-29 NOTE — Interval H&P Note (Signed)
History and Physical Interval Note:  02/29/2012 7:41 AM  [name] [name]  has presented today for surgery, with the diagnosis of CAD  The various methods of treatment have been discussed with the patient and family. After consideration of risks, benefits and other options for treatment, the patient has consented to  Procedure(s) (LRB): CORONARY ARTERY BYPASS GRAFTING (CABG) (N/A) ENDARTERECTOMY CAROTID (Right) as a surgical intervention .  The patient's history has been reviewed, patient examined, no change in status, stable for surgery.  I have reviewed the patients' chart and labs.  Questions were answered to the patient's satisfaction.     [name]

## 2012-02-29 NOTE — Progress Notes (Signed)
Pt. 's wife states that office called and said he did not have to complete pulmonary function test per Dr. Cornelius Moras.

## 2012-02-29 NOTE — Op Note (Signed)
NAME: [name] [name]   MRN: [id number] DOB: [birth year]    DATE OF OPERATION: 02/29/2012  PREOP DIAGNOSIS: asymptomatic greater than 80% right carotid stenosis, coronary artery disease  POSTOP DIAGNOSIS: same  PROCEDURE: right carotid endarterectomy with primary closure  SURGEON: Di Kindle. Edilia Bo, MD, FACS  ASSIST: Della Goo PA  ANESTHESIA: Gen.   EBL: minimal  INDICATIONS: [name] [name] is a 71 y.o. male who was found to have a greater than 80% right carotid stenosis. Preoperative cardiac evaluation showed evidence of significant coronary artery disease and he underwent heart catheterization. Was felt that he needed CABG and we elected to proceed with combined right carotid endarterectomy and CABG.  FINDINGS: patient had a markedly calcific plaque with a greater than 90% right carotid stenosis.  TECHNIQUE: The patient was brought to the operating room and received general anesthetic. A line and Swan-Ganz catheter had been placed by anesthesia. After carefully positioning the right neck was prepped and draped in the usual sterile fashion. The patient was also prepped for CABG. An incision was made along the anterior border of the sternocleidomastoid and dissection carried down to the common carotid artery which was dissected free and controlled with a Rummel tourniquet. Facial vein was divided between 2-0 silk ties. The internal carotid artery was controlled above the plaque. The external carotid artery and superior thyroid arteries were also controlled. The patient was then heparinized. Clamps were then placed on the internal, then the external, then the common carotid artery. A longitudinal arteriotomy was made in the common carotid artery and this was extended through the plaque into the internal carotid artery. A 12 shunt was placed into the internal carotid artery, back bled and then placed into the common carotid artery and secured with a Rummel tourniquet. Of note there was  excellent backbleeding. An endarterectomy plane was established proximally and the plaque was sharply divided. Eversion endarterectomy was performed of the external carotid artery. Distally was a nice tapering the plaque and no tacking sutures were required. The artery was irrigated with copious amounts of saline and all loose debris removed. The size of the artery I elected to close this primarily. The artery was closed with 2 running 6-0 Prolene sutures. Prior to completing the closure the shunt was removed. The arteries were backbled and flushed properly and the anastomosis completed. Flow was reestablished first to the external carotid artery and then into the internal carotid artery. At the completion was an excellent pulse distal to the closure and an excellent Doppler signal. Hemostasis was obtained in the wound. Given that the patient would be fully heparinized for open-heart surgery the wound was then packed and loosely closed to be closed at completion of the procedure.  The patient underwent uneventful coronary revascularization as dictated by Dr. Cornelius [name]. He performed the closure of the neck with placement of a JP drain. All needle and sponge counts were correct. The patient was transferred to the intensive care unit in stable condition.  [name] Ferrari, MD, FACS [facility]  DATE OF DICTATION:   02/29/2012

## 2012-02-29 NOTE — Brief Op Note (Signed)
02/29/2012  11:50 AM  PATIENT:  [name] [name]  71 y.o. male  PRE-OPERATIVE DIAGNOSIS:  CAD  POST-OPERATIVE DIAGNOSIS:  CAD, Carotid Artery Stenosis  PROCEDURE:  Procedure(s) (LRB): CORONARY ARTERY BYPASS GRAFTING  x3  LIMA to LAD  SVG to OM2  SVG to PDA  ENDOSCOPIC SAPHENOUS VEIN HARVEST RIGHT THIGH TO MID CALF  ENDARTERECTOMY CAROTID (Right)  SURGEON:  Surgeon(s) and Role: Panel 1:    * Purcell Nails, MD - Primary  Panel 2:    * Chuck Hint, MD - Primary  PHYSICIAN ASSISTANT: 1. Erin Barrett PA-C 2. Regina Rozniack  ANESTHESIA:   Germaine Pomfret, MD  CROSSCLAMP TIME:   76'  CARDIOPULMONARY BYPASS TIME: 26'  FINDINGS:  Normal LV function  Good quality LIMA and SVG conduit  Good quality target vessels  COMPLICATIONS: none  PATIENT DISPOSITION:   TO SICU IN STABLE CONDITION  OWEN,CLARENCE H 02/29/2012 2:09 PM

## 2012-02-29 NOTE — H&P (View-Only) (Signed)
[facility] [place]  Patient name: [name] [name] MRN: [id number] DOB: 08/26/1940 Sex: male  CC: follow up of carotid disease.  HPI: [name] [name] is a 71 y.o. male who I have been following with carotid disease. I last saw him in December 2011. He was then temporarily lost to follow up. He comes in for a routine visit. He denies any history of stroke, TIAs, expressive or receptive aphasia, or amaurosis fugax. Since I saw him last he has undergone a thyroidectomy by Dr. Todd Gerkin. He has done well from that standpoint.   He had a myocardial infarction some point in 2000 and has had no recent cardiac issues a. He is scheduled for a stress test and reducible 2 weeks from now.   Past Medical History  Diagnosis Date  . Hypertension   . Hyperlipidemia   . CAD (coronary artery disease)   . Sleep apnea   . Wears glasses     or contacts  . Deviated septum   . Thyroid nodule   . GERD (gastroesophageal reflux disease)   . Heart attack     Family History  Problem Relation Age of Onset  . Hypertension Mother   . Healthy Sister   . Healthy Brother   . Healthy Son   . Colon cancer Neg Hx     SOCIAL HISTORY: History  Substance Use Topics  . Smoking status: Former Smoker    Types: Cigarettes    Quit date: 12/03/2001  . Smokeless tobacco: Never Used   Comment: smoked 15yrs, smoked 1/2 pack a day  . Alcohol Use: Yes     social- beer , not on regular basis    No Known Allergies  Current Outpatient Prescriptions  Medication Sig Dispense Refill  . amLODipine-valsartan (EXFORGE) 10-320 MG per tablet Take 1 tablet by mouth daily.      . aspirin 81 MG tablet Take 81 mg by mouth daily.        . calcium carbonate (OS-CAL) 600 MG TABS Take 600 mg by mouth daily.      . Cholecalciferol (VITAMIN D3) 1000 UNITS CAPS Take by mouth.      . co-enzyme Q-10 30 MG capsule Take 100 mg by mouth daily.       . Diclofenac Sodium (VOLTAREN PO) Take 75 mg by mouth 2 (two)  times daily.       . FOLIC ACID PO Take 450 mcg by mouth.       . metoprolol (TOPROL-XL) 100 MG 24 hr tablet Take 100 mg by mouth daily.        . Misc Natural Products (OSTEO BI-FLEX ADV DOUBLE ST) TABS Take by mouth 2 (two) times daily.      . MULTIPLE VITAMINS PO Take by mouth daily.      . niacin 500 MG tablet Take 500 mg by mouth daily with breakfast.      . Omega-3 Fatty Acids (FISH OIL) 1000 MG CPDR Take by mouth 2 (two) times daily.      . omeprazole (PRILOSEC) 10 MG capsule Take 20 mg by mouth.       . Psyllium (METAMUCIL PO) Take 3 capsules by mouth daily.       . simvastatin (ZOCOR) 40 MG tablet Take 40 mg by mouth every evening.      . SYNTHROID 125 MCG tablet Take 1 tablet (125 mcg total) by mouth daily.  30 tablet  6    REVIEW OF SYSTEMS: [X ]   denotes positive finding; [  ] denotes negative finding CARDIOVASCULAR:  [ ] chest pain   [ ] chest pressure   [ ] palpitations   [ ] orthopnea   [X ] dyspnea on exertion   [ ] claudication   [ ] rest pain   [ ] DVT   [ ] phlebitis PULMONARY:   [ ] productive cough   [ ] asthma   [ ] wheezing NEUROLOGIC:   [ ] weakness  [ ] paresthesias  [ ] aphasia  [ ] amaurosis  [ ] dizziness HEMATOLOGIC:   [ ] bleeding problems   [ ] clotting disorders MUSCULOSKELETAL:  [ ] joint pain   [ ] joint swelling [ ] leg swelling GASTROINTESTINAL: [ ]  blood in stool  [ ]  hematemesis GENITOURINARY:  [ ]  dysuria  [ ]  hematuria PSYCHIATRIC:  [ ] history of major depression INTEGUMENTARY:  [ ] rashes  [ ] ulcers CONSTITUTIONAL:  [ ] fever   [ ] chills  PHYSICAL EXAM: Filed Vitals:   02/07/12 1459 02/07/12 1500  BP: 126/78 136/80  Pulse: 60 61  Resp: 16 12  Height: 6' 2" (1.88 m)   Weight: 213 lb 9.6 oz (96.888 kg)   SpO2: 96%    Body mass index is 27.42 kg/(m^2). GENERAL: The patient is a well-nourished male, in no acute distress. The vital signs are documented above. CARDIOVASCULAR: There is a regular rate and rhythm without significant murmur  appreciated. He has a right carotid bruit. He has palpable femoral and pedal pulses bilaterally. PULMONARY: There is good air exchange bilaterally without wheezing or rales. ABDOMEN: Soft and non-tender with normal pitched bowel sounds. I do not appreciate an abdominal aortic aneurysm. MUSCULOSKELETAL: There are no major deformities or cyanosis. NEUROLOGIC: No focal weakness or paresthesias are detected. SKIN: There are no ulcers or rashes noted. PSYCHIATRIC: The patient has a normal affect.  DATA:  I have independently interpreted his carotid duplex scan which shows a greater than 80% right carotid stenosis with a 60-79% left carotid stenosis.   MEDICAL ISSUES:  Occlusion and stenosis of carotid artery without mention of cerebral infarction This patient has an asymptomatic greater than 80% right carotid stenosis. Given the severity of the stenosis I have recommended right carotid endarterectomy in order to lower his risk of future stroke. I have reviewed the indications for carotid endarterectomy, that is to lower the risk of future stroke. I have also reviewed the potential complications of surgery, including but not limited to: bleeding, stroke (perioperative risk 1-2%), MI, nerve injury of other unpredictable medical problems. All of the patients questions were answered and they are agreeable to proceed with surgery. Of note the patient is scheduled for a stress test in 2 weeks inside ask him to proceed with this test so that we can be sure that it is safe from a cardiac risk to proceed with surgery. Once the stress test is complete he will call to schedule surgery. He does know to continue his aspirin right up through surgery.    Sybrina Laning S [facility] [place] Office: [phone]   

## 2012-02-29 NOTE — Anesthesia Procedure Notes (Signed)
Procedure Name: Intubation Date/Time: 02/29/2012 8:04 AM Performed by: Glendora Score A Pre-anesthesia Checklist: Patient identified, Emergency Drugs available, Suction available and Patient being monitored Patient Re-evaluated:Patient Re-evaluated prior to inductionOxygen Delivery Method: Circle system utilized Preoxygenation: Pre-oxygenation with 100% oxygen Intubation Type: IV induction Ventilation: Mask ventilation without difficulty and Oral airway inserted - appropriate to patient size Laryngoscope Size: Hyacinth Meeker and 2 Grade View: Grade I Tube type: Oral Tube size: 8.0 mm Number of attempts: 1 Airway Equipment and Method: Stylet Placement Confirmation: ETT inserted through vocal cords under direct vision,  positive ETCO2 and breath sounds checked- equal and bilateral Secured at: 23 cm Tube secured with: Tape Dental Injury: Teeth and Oropharynx as per pre-operative assessment

## 2012-02-29 NOTE — Interval H&P Note (Signed)
History and Physical Interval Note:  02/29/2012 7:41 AM  [name] [name]  has presented today for surgery, with the diagnosis of CAD  The various methods of treatment have been discussed with the patient and family. After consideration of risks, benefits and other options for treatment, the patient has consented to  Procedure(s) (LRB): CORONARY ARTERY BYPASS GRAFTING (CABG) (N/A) ENDARTERECTOMY CAROTID (Right) as a surgical intervention .  The patient's history has been reviewed, patient examined, no change in status, stable for surgery.  I have reviewed the patients' chart and labs.  Questions were answered to the patient's satisfaction.     OWEN,CLARENCE H   

## 2012-03-01 ENCOUNTER — Inpatient Hospital Stay (HOSPITAL_COMMUNITY): Payer: Medicare Other

## 2012-03-01 LAB — BASIC METABOLIC PANEL
BUN: 11 mg/dL (ref 6–23)
Calcium: 8 mg/dL — ABNORMAL LOW (ref 8.4–10.5)
GFR calc Af Amer: 83 mL/min — ABNORMAL LOW (ref >90)
GFR calc non Af Amer: 72 mL/min — ABNORMAL LOW (ref >90)
Glucose, Bld: 123 mg/dL — ABNORMAL HIGH (ref 70–99)
Sodium: 138 mEq/L (ref 135–145)

## 2012-03-01 LAB — GLUCOSE, CAPILLARY
Glucose-Capillary: 120 mg/dL — ABNORMAL HIGH (ref 70–99)
Glucose-Capillary: 123 mg/dL — ABNORMAL HIGH (ref 70–99)
Glucose-Capillary: 133 mg/dL — ABNORMAL HIGH (ref 70–99)

## 2012-03-01 LAB — CBC
HCT: 27.4 % — ABNORMAL LOW (ref 39.0–52.0)
Hemoglobin: 9.6 g/dL — ABNORMAL LOW (ref 13.0–17.0)
MCH: 30.6 pg (ref 26.0–34.0)
MCHC: 35 g/dL (ref 30.0–36.0)
RDW: 12.5 % (ref 11.5–15.5)

## 2012-03-01 IMAGING — CR DG CHEST 1V PORT
1 series · 1 of 1 positions shown · non-contrast
Comparison: [DATE].

CLINICAL DATA: Post CABG.

PORTABLE CHEST - 1 VIEW

[AP]
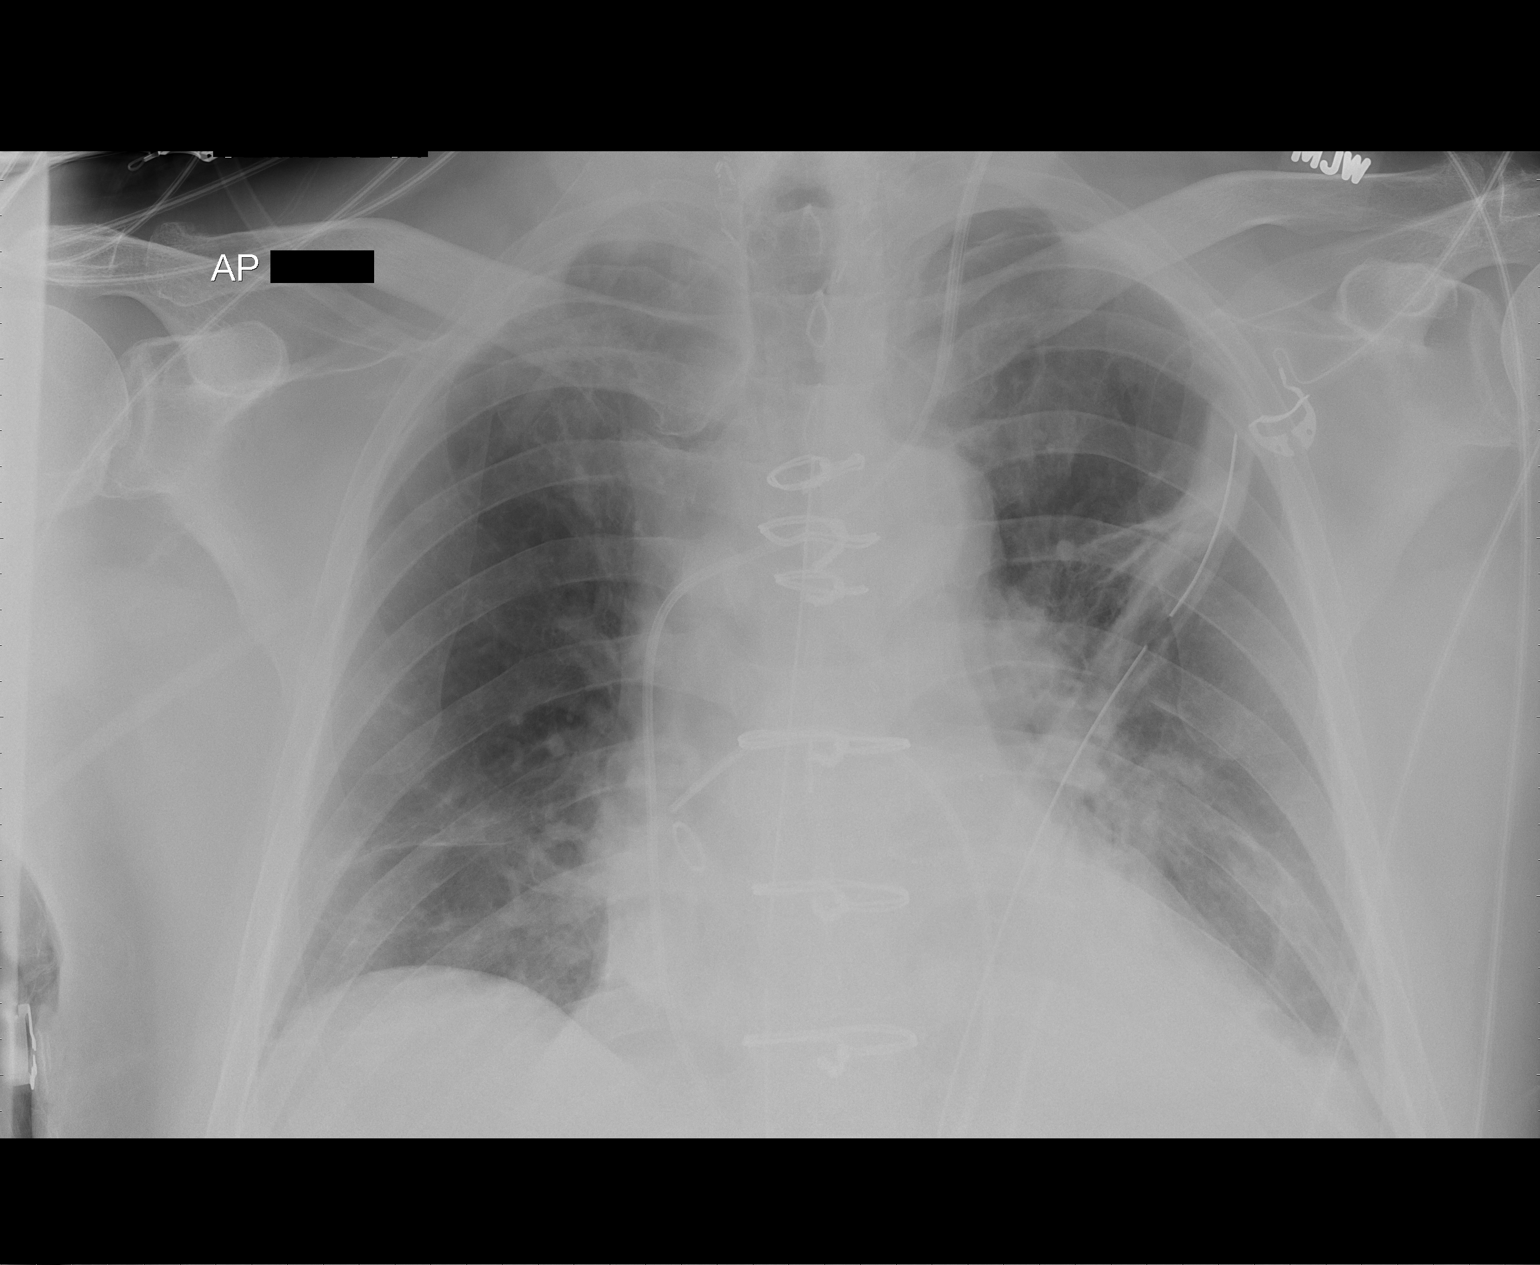

[1 of 1 positions shown; findings below may reference images not displayed]

FINDINGS: Endotracheal tube and nasogastric tube have been removed.

Left-sided Swan-Ganz catheter tip right lower lobe pulmonary artery
unchanged in position.

Left-sided chest tube is in place.  Very small left apical
pneumothorax.

Left mid lung subsegmental atelectasis.

Consolidation left base consistent with atelectasis.

Central pulmonary vascular prominence.

Subsegmental atelectasis right lung base.

Cardiomegaly.

Calcified mildly tortuous aorta.
IMPRESSION: Very small left apical pneumothorax.  Please see above.

This is a call report.

## 2012-03-01 MED ORDER — MIDAZOLAM HCL 2 MG/2ML IJ SOLN
INTRAMUSCULAR | Status: AC
  Filled 2012-03-01: qty 2

## 2012-03-01 MED ORDER — TRAMADOL HCL 50 MG PO TABS: 50.0000 mg | ORAL_TABLET | ORAL | Status: DC | PRN

## 2012-03-01 MED ORDER — KETOROLAC TROMETHAMINE 30 MG/ML IJ SOLN
30.0000 mg | Freq: Once | INTRAMUSCULAR | Status: AC
  Administered 2012-03-01: 30 mg via INTRAVENOUS
  Filled 2012-03-01: qty 1

## 2012-03-01 MED ORDER — POTASSIUM CHLORIDE 10 MEQ/50ML IV SOLN
10.0000 meq | INTRAVENOUS | Status: AC
  Administered 2012-03-01 (×2): 10 meq via INTRAVENOUS
  Filled 2012-03-01: qty 100

## 2012-03-01 MED ORDER — FUROSEMIDE 10 MG/ML IJ SOLN
20.0000 mg | Freq: Four times a day (QID) | INTRAMUSCULAR | Status: DC
  Filled 2012-03-01 (×3): qty 2

## 2012-03-01 MED ORDER — MORPHINE SULFATE 2 MG/ML IJ SOLN: 2.0000 mg | INTRAMUSCULAR | Status: DC | PRN

## 2012-03-01 MED ORDER — FUROSEMIDE 40 MG PO TABS
40.0000 mg | ORAL_TABLET | Freq: Every day | ORAL | Status: DC
  Administered 2012-03-02 – 2012-03-05 (×4): 40 mg via ORAL
  Filled 2012-03-01 (×4): qty 1

## 2012-03-01 MED ORDER — MIDAZOLAM HCL 2 MG/2ML IJ SOLN
2.0000 mg | Freq: Once | INTRAMUSCULAR | Status: AC
  Administered 2012-03-01: 2 mg via INTRAVENOUS

## 2012-03-01 MED ORDER — ALPRAZOLAM 0.25 MG PO TABS
0.2500 mg | ORAL_TABLET | Freq: Four times a day (QID) | ORAL | Status: DC | PRN
  Administered 2012-03-01: 0.25 mg via ORAL
  Filled 2012-03-01 (×2): qty 1

## 2012-03-01 MED ORDER — FUROSEMIDE 10 MG/ML IJ SOLN
20.0000 mg | Freq: Four times a day (QID) | INTRAMUSCULAR | Status: AC
  Administered 2012-03-01 (×2): 20 mg via INTRAVENOUS
  Filled 2012-03-01 (×2): qty 2

## 2012-03-01 MED ORDER — SODIUM CHLORIDE 0.9 % IV SOLN: 250.0000 mL | INTRAVENOUS | Status: DC | PRN

## 2012-03-01 MED ORDER — MOVING RIGHT ALONG BOOK
Freq: Once | Status: AC
  Administered 2012-03-01: 08:00:00
  Filled 2012-03-01: qty 1

## 2012-03-01 MED ORDER — METOPROLOL TARTRATE 25 MG PO TABS
25.0000 mg | ORAL_TABLET | Freq: Two times a day (BID) | ORAL | Status: DC
  Administered 2012-03-01 – 2012-03-03 (×6): 25 mg via ORAL
  Filled 2012-03-01 (×8): qty 1

## 2012-03-01 MED ORDER — SODIUM CHLORIDE 0.9 % IJ SOLN
3.0000 mL | Freq: Two times a day (BID) | INTRAMUSCULAR | Status: DC
  Administered 2012-03-01 – 2012-03-04 (×8): 3 mL via INTRAVENOUS

## 2012-03-01 MED ORDER — SODIUM CHLORIDE 0.9 % IJ SOLN: 3.0000 mL | INTRAMUSCULAR | Status: DC | PRN

## 2012-03-01 MED ORDER — POTASSIUM CHLORIDE CRYS ER 20 MEQ PO TBCR
20.0000 meq | EXTENDED_RELEASE_TABLET | Freq: Every day | ORAL | Status: DC
  Administered 2012-03-02 – 2012-03-05 (×4): 20 meq via ORAL
  Filled 2012-03-01 (×4): qty 1

## 2012-03-01 MED FILL — Potassium Chloride Inj 2 mEq/ML: INTRAVENOUS | Qty: 40 | Status: AC

## 2012-03-01 MED FILL — Magnesium Sulfate Inj 50%: INTRAMUSCULAR | Qty: 10 | Status: AC

## 2012-03-01 NOTE — Progress Notes (Signed)
POD # 1 CABG x3, R CEA  Stable day  BP 108/55  Pulse 69  Temp 99.1 F (37.3 C) (Oral)  Resp 22  Ht 5\' 11"  (1.803 m)  Wt 219 lb 2.2 oz (99.4 kg)  BMI 30.56 kg/m2  SpO2 98%   Intake/Output Summary (Last 24 hours) at 03/01/12 1633 Last data filed at 03/01/12 1619  Gross per 24 hour  Intake 1553.7 ml  Output   3105 ml  Net -1551.3 ml    Awaiting transfer

## 2012-03-01 NOTE — Care Management Note (Signed)
    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  PTA, PT INDEPENDENT, LIVES WITH SPOUSE.     Action/Plan:   MET WITH PT TO DISCUSS DC PLANS.  PT STATES WIFE TO PROVIDE 24HR CARE AT DISCHARGE.  WILL FOLLOW FOR HOME NEEDS AS PT PROGRESSES.   Anticipated DC Date:  03/05/2012   Anticipated DC Plan:  HOME W HOME HEALTH SERVICES      DC Planning Services  CM consult      Choice offered to / List presented to:             Status of service:  Completed, signed off Medicare Important Message given?   (If response is "NO", the following Medicare IM given date fields will be blank) Date Medicare IM given:   Date Additional Medicare IM given:    Discharge Disposition:  HOME/SELF CARE  Per UR Regulation:    If discussed at Long Length of Stay Meetings, dates discussed:    Comments:  03/05/12 Lorriane Dehart,RN,BSN 1030 PT FOR DISCHARGE HOME TODAY WITH WIFE.  PT AND WIFE DENY ANY NEEDS FOR HOME.

## 2012-03-01 NOTE — Progress Notes (Signed)
PT refused CPAP due to pain from the mask. PT was advised to call if he needs CPAP during the night

## 2012-03-01 NOTE — Progress Notes (Addendum)
   CARDIOTHORACIC SURGERY PROGRESS NOTE   R1 Day Post-Op Procedure(s) (LRB): CORONARY ARTERY BYPASS GRAFTING (CABG) (N/A) ENDARTERECTOMY CAROTID (Right)  Subjective: Feels sore in chest.  Otherwise okay.  Objective: Vital signs: BP Readings from Last 1 Encounters:  03/01/12 94/53   Pulse Readings from Last 1 Encounters:  03/01/12 79   Resp Readings from Last 1 Encounters:  03/01/12 22   Temp Readings from Last 1 Encounters:  03/01/12 100 F (37.8 C)     Hemodynamics: PAP: (21-31)/(8-19) 27/15 mmHg CO:  [3.6 L/min-6 L/min] 5 L/min CI:  [1.7 L/min/m2-2.8 L/min/m2] 2.3 L/min/m2  Physical Exam:  Rhythm:   sinus  Breath sounds: clear  Heart sounds:  RRR  Incisions:  Dressings dry  Abdomen:  soft  Extremities:  Warm  Neuro:   Grossly intact   Intake/Output from previous day: 07/18 0701 - 07/19 0700 In: 6657.9 [P.O.:240; I.V.:4587.9; Blood:300; IV Piggyback:1530] Out: 6154 [Urine:4420; Emesis/NG output:50; Drains:24; Blood:1100; Chest Tube:560] Intake/Output this shift:    Lab Results:  Basename 03/01/12 0400 02/29/12 2006 02/29/12 1956  WBC 9.6 -- 8.5  HGB 9.6* 9.9* --  HCT 27.4* 29.0* --  PLT 121* -- 122*   BMET:  Basename 03/01/12 0400 02/29/12 2006 02/28/12 1358  NA 138 141 --  K 3.9 4.1 --  CL 106 105 --  CO2 23 -- 18*  GLUCOSE 123* 116* --  BUN 11 11 --  CREATININE 1.04 1.10 --  CALCIUM 8.0* -- 9.4    CBG (last 3)   Basename 03/01/12 0353 02/29/12 2348 02/29/12 2200  GLUCAP 123* 120* 116*   ABG    Component Value Date/Time   PHART 7.405 02/29/2012 2002   HCO3 23.5 02/29/2012 2002   TCO2 21 02/29/2012 2006   ACIDBASEDEF 1.0 02/29/2012 2002   O2SAT 96.0 02/29/2012 2002   CXR: Looks good.  Mild congestion and postop atelectasis  Assessment/Plan: S/P Procedure(s) (LRB): CORONARY ARTERY BYPASS GRAFTING (CABG) (N/A) ENDARTERECTOMY CAROTID (Right)  Doing well POD1 Expected post op acute blood loss anemia, mild, stable Expected post op  volume excess, mild Postop atelectasis   Mobilize  D/C tubes and lines  Diuresis  Transfer step down  Titrate up metoprolol to 25 bid today   Restart home antihypertensive meds if BP increases further   [name] [name] H 03/01/2012 8:06 AM

## 2012-03-01 NOTE — Progress Notes (Signed)
VASCULAR PROGRESS NOTE  SUBJECTIVE: Alert. Pain adequately controlled.   PHYSICAL EXAM: Filed Vitals:   03/01/12 0400 03/01/12 0500 03/01/12 0600 03/01/12 0700  BP: 102/61 96/56 103/56 94/53  Pulse: 80 80 80 79  Temp: 99.9 F (37.7 C) 99.9 F (37.7 C) 100 F (37.8 C) 100 F (37.8 C)  TempSrc: Core (Comment)  Core (Comment)   Resp: 20 18 23 22   Height:      Weight:   219 lb 2.2 oz (99.4 kg)   SpO2: 99% 97% 98% 98%   Dressing right neck dry Neuro intact.  JP 15 cc / 8 hours  LABS: Lab Results  Component Value Date   WBC 9.6 03/01/2012   HGB 9.6* 03/01/2012   HCT 27.4* 03/01/2012   MCV 87.3 03/01/2012   PLT 121* 03/01/2012   Lab Results  Component Value Date   CREATININE 1.04 03/01/2012   Lab Results  Component Value Date   INR 1.43 02/29/2012   CBG (last 3)   Basename 03/01/12 0353 02/29/12 2348 02/29/12 2200  GLUCAP 123* 120* 116*     ASSESSMENT/PLAN: 1. 1 Day Post-Op s/p: R CEA/ CABG 2. Doing well from vascular standpoint 3. D/C JP  Waverly Ferrari, MD, FACS Beeper: [phone] 03/01/2012

## 2012-03-02 ENCOUNTER — Inpatient Hospital Stay (HOSPITAL_COMMUNITY): Payer: Medicare Other

## 2012-03-02 LAB — BASIC METABOLIC PANEL
BUN: 15 mg/dL (ref 6–23)
CO2: 25 mEq/L (ref 19–32)
Chloride: 102 mEq/L (ref 96–112)
GFR calc non Af Amer: 55 mL/min — ABNORMAL LOW (ref >90)
Glucose, Bld: 154 mg/dL — ABNORMAL HIGH (ref 70–99)
Potassium: 4 mEq/L (ref 3.5–5.1)

## 2012-03-02 LAB — CBC
HCT: 29 % — ABNORMAL LOW (ref 39.0–52.0)
Hemoglobin: 9.8 g/dL — ABNORMAL LOW (ref 13.0–17.0)
MCHC: 33.8 g/dL (ref 30.0–36.0)
RBC: 3.27 MIL/uL — ABNORMAL LOW (ref 4.22–5.81)

## 2012-03-02 LAB — TSH: TSH: 0.666 u[IU]/mL (ref 0.350–4.500)

## 2012-03-02 IMAGING — CR DG CHEST 2V
2 series · 2 of 2 positions shown · non-contrast
Comparison: [DATE]

***ADDENDUM*** CREATED: [DATE] [DATE]

The original report was by Dr. KRISTAL.  The following
addendum is by Dr. KRISTAL:
Lateral projection reveals small bilateral pleural effusions.
CLINICAL DATA: Postop day #2 status post CABG.
CHEST - 2 VIEW

[w chest pa]
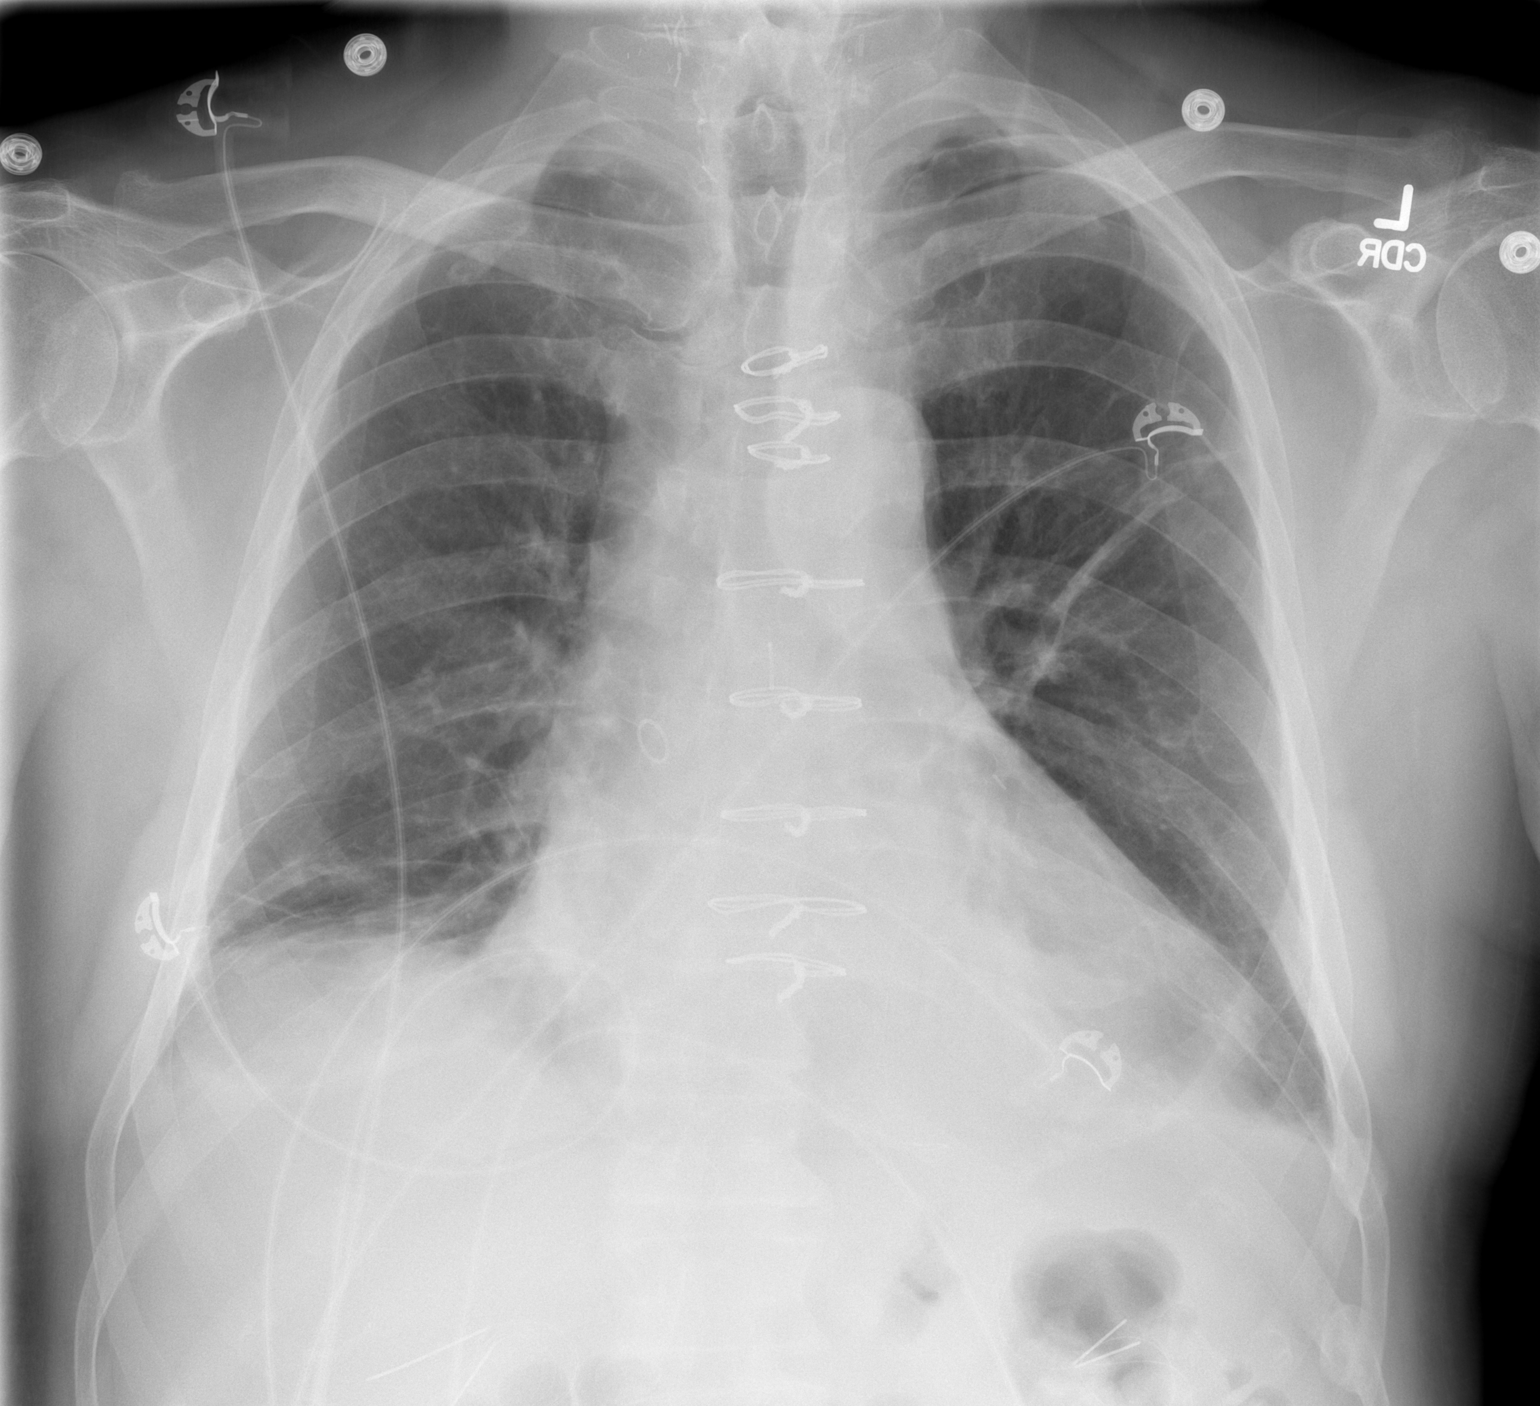

[w chest lat]
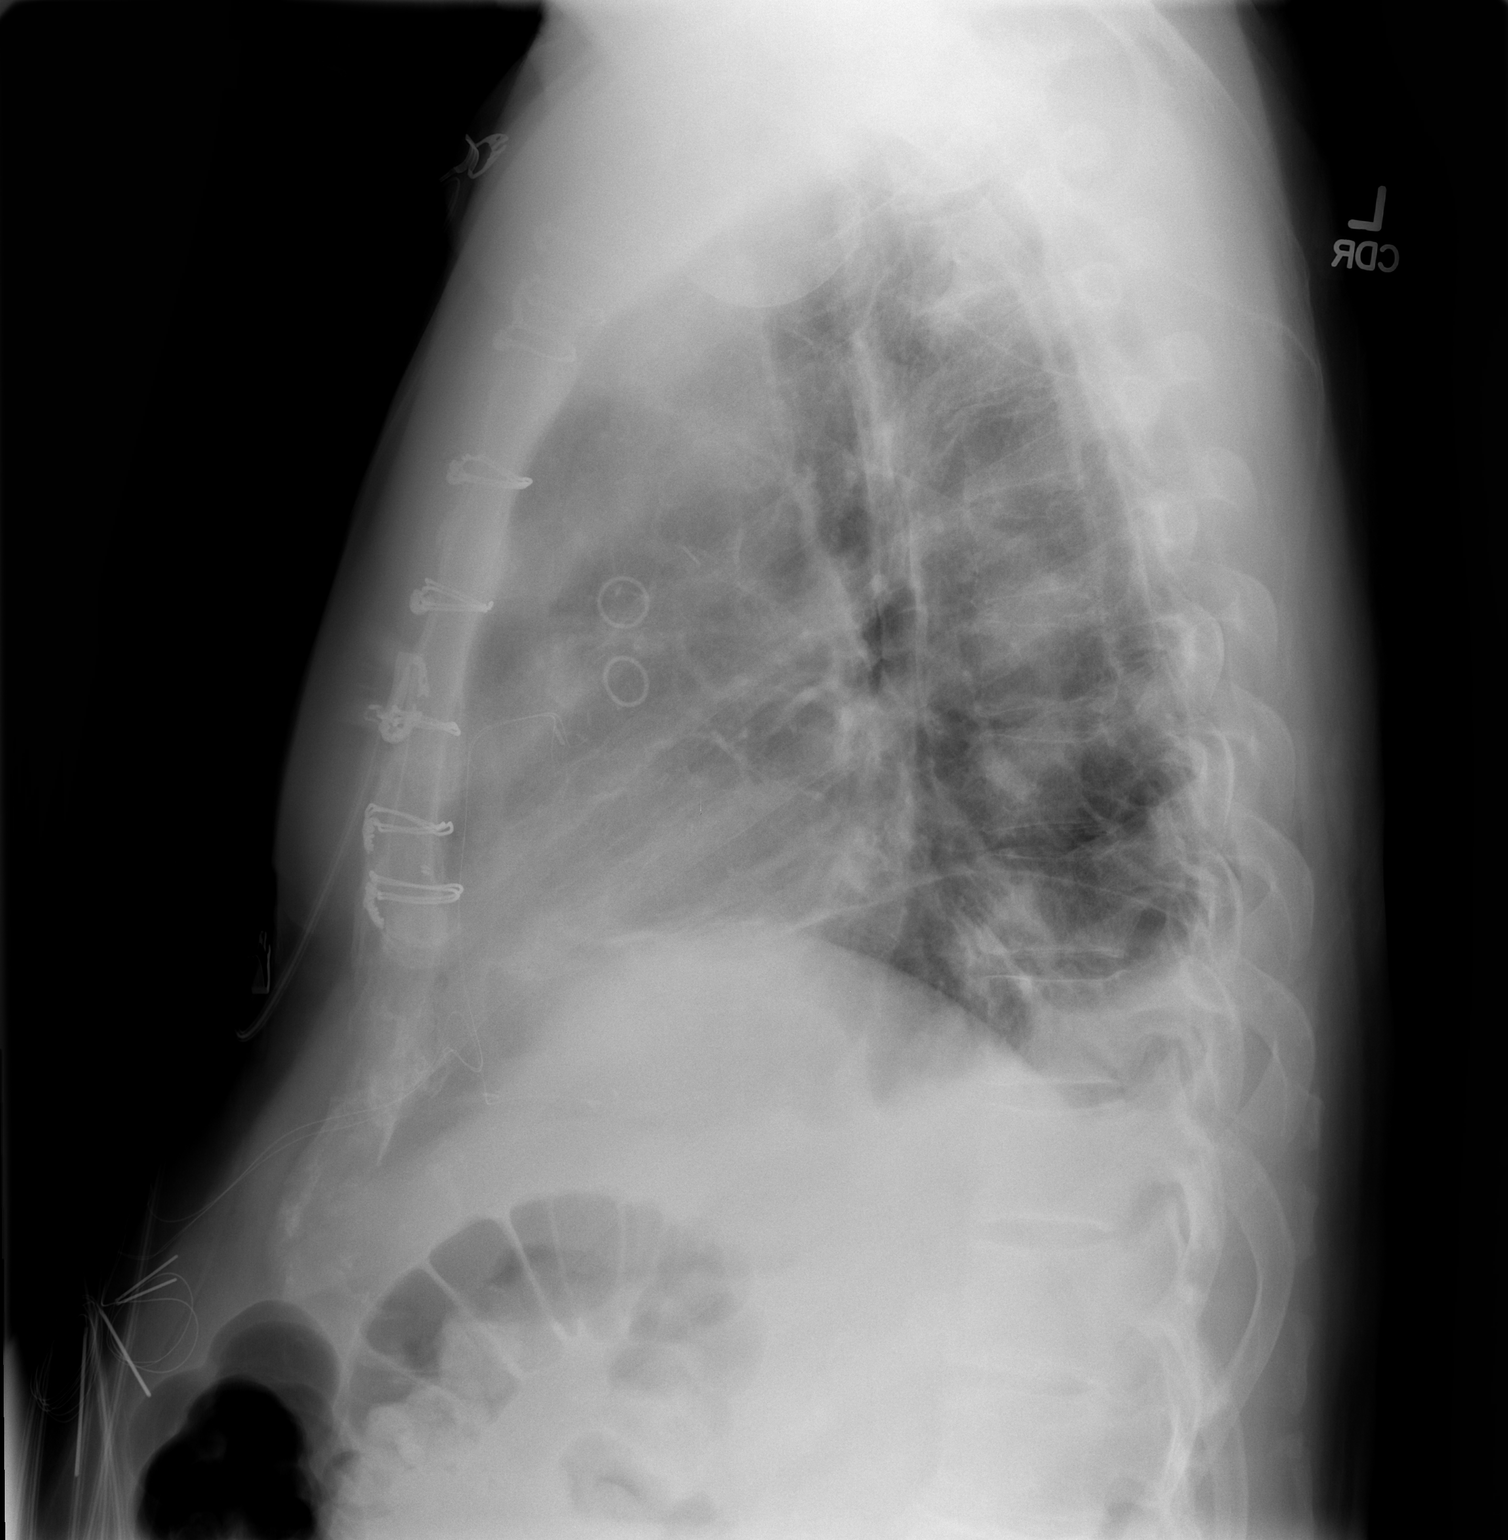

[2 of 2 positions shown; findings below may reference images not displayed]

FINDINGS: The left internal jugular Swan-Ganz catheter, left chest
tube, and mediastinal drain have been removed.  Epicardial pacer
leads noted.

Improved aeration noted in the left lower lobe with only mild
residual airspace opacity medially.  There are some increasing
linear opacities at the right lung base favoring atelectasis.
Minimal atelectasis noted in the left mid lung, stable.

Cardiomegaly persists.  Equivocal tiny left apical pneumothorax
noted.  Old left lower rib fractures are observed.
IMPRESSION: 1.  Tubes and lines have been discontinued.  Improved aeration at
the left lung base.  Mildly increased atelectasis at the right lung
base.
2.  Cardiomegaly.
3.  Equivocal tiny left apical pneumothorax.

## 2012-03-02 MED ORDER — ONDANSETRON HCL 4 MG/2ML IJ SOLN
INTRAMUSCULAR | Status: AC
  Administered 2012-03-02: 4 mg via INTRAVENOUS
  Filled 2012-03-02: qty 2

## 2012-03-02 MED ORDER — ATORVASTATIN CALCIUM 20 MG PO TABS
20.0000 mg | ORAL_TABLET | Freq: Every day | ORAL | Status: DC
  Administered 2012-03-02 – 2012-03-04 (×2): 20 mg via ORAL
  Filled 2012-03-02 (×4): qty 1

## 2012-03-02 MED ORDER — AMIODARONE LOAD VIA INFUSION
150.0000 mg | Freq: Once | INTRAVENOUS | Status: AC
  Administered 2012-03-02: 150 mg via INTRAVENOUS
  Filled 2012-03-02: qty 83.34

## 2012-03-02 MED ORDER — BISACODYL 10 MG RE SUPP
10.0000 mg | Freq: Once | RECTAL | Status: AC
  Administered 2012-03-02: 10 mg via RECTAL
  Filled 2012-03-02: qty 1

## 2012-03-02 MED ORDER — AMIODARONE HCL IN DEXTROSE 360-4.14 MG/200ML-% IV SOLN
1.0000 mg/min | INTRAVENOUS | Status: AC
  Administered 2012-03-02 (×2): 1 mg/min via INTRAVENOUS
  Filled 2012-03-02 (×2): qty 200

## 2012-03-02 MED ORDER — AMIODARONE HCL IN DEXTROSE 360-4.14 MG/200ML-% IV SOLN
0.5000 mg/min | INTRAVENOUS | Status: DC
  Administered 2012-03-02: 0.5 mg/min via INTRAVENOUS
  Filled 2012-03-02 (×4): qty 200

## 2012-03-02 MED ORDER — ONDANSETRON HCL 4 MG/2ML IJ SOLN
4.0000 mg | Freq: Four times a day (QID) | INTRAMUSCULAR | Status: DC | PRN
  Administered 2012-03-02: 4 mg via INTRAVENOUS

## 2012-03-02 NOTE — Progress Notes (Signed)
1155- Per RN, patient in rapid Afib, no ambulation at this time. Will f/u Monday.

## 2012-03-02 NOTE — Progress Notes (Signed)
03/02/2012 0730 Late entry Nursing note Pt. Noted to be in A-fib on monitor. Rate between 100-160. Pt. Asymptomatic. BP 143/83. 2L o2  applied. Scheduled Po metoprolol given. Dr. Dorris Fetch paged and made aware. Orders received and enacted.  Will continue to monitor. Jessenia Filippone, Blanchard Kelch

## 2012-03-02 NOTE — Progress Notes (Addendum)
2 Days Post-Op Procedure(s) (LRB): CORONARY ARTERY BYPASS GRAFTING (CABG) (N/A) ENDARTERECTOMY CAROTID (Right)  Subjective: Patient felt heart racing earlier.Just finished breakfast. Is passing flatus but no bowel movement yet. He denies abdominal pain, nausea, or emesis. He does feel "bloated".  Objective: Vital signs in last 24 hours: Patient Vitals for the past 24 hrs:  BP Temp Temp src Pulse Resp SpO2 Weight  03/02/12 0711 142/83 mmHg - - 143  - - -  03/02/12 0500 130/72 mmHg 98.7 F (37.1 C) Oral 76  19  90 % 218 lb 1.6 oz (98.93 kg)  03/01/12 2111 166/80 mmHg 99.4 F (37.4 C) Oral 96  20  90 % 218 lb 11.1 oz (99.2 kg)  03/01/12 1957 - 98.9 F (37.2 C) Oral - - - -  03/01/12 1900 105/58 mmHg - - 94  21  91 % -  03/01/12 1800 117/65 mmHg - - 79  19  93 % -  03/01/12 1700 109/50 mmHg - - 72  21  91 % -  03/01/12 1609 - 99.1 F (37.3 C) Oral - - - -  03/01/12 1600 108/55 mmHg - - 69  22  98 % -  03/01/12 1500 99/60 mmHg - - 68  18  96 % -  03/01/12 1400 111/60 mmHg - - 73  20  97 % -  03/01/12 1300 114/62 mmHg - - 71  22  98 % -  03/01/12 1200 104/60 mmHg - - 73  22  97 % -  03/01/12 1147 - 98.9 F (37.2 C) Oral - - - -  03/01/12 1100 116/64 mmHg - - 85  22  97 % -  03/01/12 1000 112/66 mmHg - - 75  22  98 % -  03/01/12 0900 - - - 84  22  97 % -   Pre op weight  95.7 kg Current Weight  03/02/12 218 lb 1.6 oz (98.93 kg)      Intake/Output from previous day: 07/19 0701 - 07/20 0700 In: 204 [I.V.:100; IV Piggyback:104] Out: 995 [Urine:990; Drains:5]   Physical Exam:  Cardiovascular: IRRR, IRRR; no murmurs, gallops, or rubs. Pulmonary: Clear to auscultation bilaterally; no rales, wheezes, or rhonchi. Abdomen: Soft, non tender, some distention,bowel sounds present. Extremities: Mild bilateral lower extremity edema. Wounds: Clean and dry.  No erythema or signs of infection. Neurologic:Grossly intact without focal deficits.  Lab Results: CBC: Basename 03/02/12 0551  03/01/12 0400  WBC 10.5 9.6  HGB 9.8* 9.6*  HCT 29.0* 27.4*  PLT 113* 121*   BMET:  Basename 03/02/12 0551 03/01/12 0400  NA 136 138  K 4.0 3.9  CL 102 106  CO2 25 23  GLUCOSE 154* 123*  BUN 15 11  CREATININE 1.30 1.04  CALCIUM 8.2* 8.0*    PT/INR:  Lab Results  Component Value Date   INR 1.43 02/29/2012   INR 0.96 02/28/2012   INR 0.99 02/02/2011   ABG:  INR: Will add last result for INR, ABG once components are confirmed Will add last 4 CBG results once components are confirmed  Assessment/Plan:  1. CV - Afib with RVR at 7:00 am. Amiodarone gttp ordered. Continue Lopressor 25 bid. 2.  Pulmonary - Encourage incentive spirometer.CXR this am shows questionable trace left apical ptx, cardiomegaly, improved aeration on left, and atelectasis on the right. 3. Volume Overload - Continue with diuresis. 4.  Acute blood loss anemia - H and H stable this am at 9.8 and 29. 5.Thrombocytopenia-Platelets 113,000. 6.Gastroenterology-abd distention but  no pain, N/V. Will monitor. 7.Check TSH as with history of hypothyroid.   ZIMMERMAN,DONIELLE MPA-C 03/02/2012  Patient seen and examined. Agree with above. Back in SR currently

## 2012-03-02 NOTE — Progress Notes (Signed)
  Amiodarone Drug - Drug Interaction Consult Note  Recommendations: Amiodarone is metabolized by the cytochrome P450 system and therefore has the potential to cause many drug interactions. Amiodarone has an average plasma half-life of 50 days (range 20 to 100 days).   There is potential for drug interactions to occur several weeks or months after stopping treatment and the onset of drug interactions may be slow after initiating amiodarone.   [x]  Statins: Increased risk of myopathy. Simvastatin- currently on 40mg  qday.  Dose should not exceed 20mg /day with Amiodarone.  Will change to Lipitor 20mg . Other statins: counsel patients to report any muscle pain or weakness immediately.  []  Anticoagulants: Amiodarone can increase anticoagulant effect. Consider warfarin dose reduction. Patients should be monitored closely and the dose of anticoagulant altered accordingly, remembering that amiodarone levels take several weeks to stabilize.  []  Antiepileptics: Amiodarone can increase plasma concentration of phenytoin, phenytoin dose should be reduced. Note that small changes in phenytoin dose can result in large changes in phenytoin levels. Monitor patient closely and counsel on signs of toxicity.  [x]  Beta blockers: increased risk of bradycardia, AV block and myocardial depression. Sotalol - avoid concomitant use.  []   Calcium channel blockers (diltiazem and verapamil): increased risk of bradycardia, AV block and myocardial depression.  []   Cyclosporine: Amiodarone increases levels of cyclosporine. Reduced dose of cyclosporine is recommended.  []  Digoxin dose should be halved when amiodarone is started.  []  Diuretics: increased risk of cardiotoxicity if hypokalemia occurs.  []  Oral hypoglycemic agents (glyburide, glipizide, glimepiride): increased risk of hypoglycemia. Patient's glucose levels should be monitored closely when initiating amiodarone therapy.   []  Drugs that prolong the QT interval:  Concurrent therapy is contraindicated due to the increased risk of torsades de pointes; . Antibiotics: e.g. fluoroquinolones, erythromycin. . Antiarrhythmics: e.g. quinidine, procainamide, disopyramide, sotalol. . Antipsychotics: e.g. phenothiazines, haloperidol.  . Lithium, tricyclic antidepressants, and methadone. Thank You,  Madolyn Frieze  03/02/2012 3:34 PM

## 2012-03-03 LAB — BASIC METABOLIC PANEL
CO2: 28 mEq/L (ref 19–32)
Chloride: 100 mEq/L (ref 96–112)
Creatinine, Ser: 1.21 mg/dL (ref 0.50–1.35)
GFR calc Af Amer: 69 mL/min — ABNORMAL LOW (ref >90)
Potassium: 3.9 mEq/L (ref 3.5–5.1)

## 2012-03-03 MED ORDER — POTASSIUM CHLORIDE CRYS ER 20 MEQ PO TBCR: 20.0000 meq | EXTENDED_RELEASE_TABLET | Freq: Every day | ORAL | Status: DC

## 2012-03-03 MED ORDER — METOPROLOL TARTRATE 25 MG PO TABS: 25.0000 mg | ORAL_TABLET | Freq: Two times a day (BID) | ORAL | Status: DC

## 2012-03-03 MED ORDER — AMIODARONE HCL 200 MG PO TABS
400.0000 mg | ORAL_TABLET | Freq: Two times a day (BID) | ORAL | Status: DC
  Administered 2012-03-03 – 2012-03-05 (×5): 400 mg via ORAL
  Filled 2012-03-03 (×7): qty 2

## 2012-03-03 MED ORDER — FUROSEMIDE 40 MG PO TABS: 40.0000 mg | ORAL_TABLET | Freq: Every day | ORAL | Status: DC

## 2012-03-03 MED ORDER — ASPIRIN 325 MG PO TBEC: 325.0000 mg | DELAYED_RELEASE_TABLET | Freq: Every day | ORAL | Status: AC

## 2012-03-03 MED ORDER — AMIODARONE HCL 400 MG PO TABS: 400.0000 mg | ORAL_TABLET | Freq: Two times a day (BID) | ORAL | Status: DC

## 2012-03-03 MED ORDER — ATORVASTATIN CALCIUM 20 MG PO TABS: 20.0000 mg | ORAL_TABLET | Freq: Every day | ORAL | Status: DC

## 2012-03-03 MED ORDER — LACTULOSE 10 GM/15ML PO SOLN
20.0000 g | Freq: Once | ORAL | Status: AC
  Administered 2012-03-03: 20 g via ORAL
  Filled 2012-03-03: qty 30

## 2012-03-03 MED ORDER — OXYCODONE HCL 5 MG PO TABS: 5.0000 mg | ORAL_TABLET | ORAL | Status: AC | PRN

## 2012-03-03 NOTE — Progress Notes (Signed)
03/03/2012 12:24 PM Nursing note Pt. Ambulated 250 ft around unit with RW, RN and on RA. Pt. Tolerated well. o2 sats remained greater than 95 %. During ambulation, pt. Did have short burst of A-fib, rate controlled, non-sustained. Spoke with NSMT, pt. Noted to have frequent PAC as well as several brief runs of a-fib, non-sustained, throughout morning since amiodarone gtt d/c. Rate controlled during runs.  Doree Fudge PA-C called and made aware. Orders received to leave EPW in one more day. Orders modified in EPIC. Will continue to closely monitor patient and rhythm.  Demarkis Gheen, Blanchard Kelch

## 2012-03-03 NOTE — Progress Notes (Addendum)
3 Days Post-Op Procedure(s) (LRB): CORONARY ARTERY BYPASS GRAFTING (CABG) (N/A) ENDARTERECTOMY CAROTID (Right)  Subjective: He had nausea and emesis x 1 yesterday. He "feels better" this am.Patient is passing flatus but no bowel movement yet. He denies abdominal pain, nausea, or emesis.   Objective: Vital signs in last 24 hours: Patient Vitals for the past 24 hrs:  BP Temp Temp src Pulse Resp SpO2 Weight  03/03/12 0451 104/64 mmHg 98.6 F (37 C) Oral 70  19  94 % 214 lb 9.6 oz (97.342 kg)  03/03/12 0400 - - - 77  - - -  03/03/12 0003 - - - 93  - - -  03/02/12 2027 148/87 mmHg 99.5 F (37.5 C) Oral 92  19  93 % -  03/02/12 1400 147/82 mmHg 98.2 F (36.8 C) Oral 89  18  93 % -  03/02/12 0830 122/85 mmHg - - 85  - - -  03/02/12 0815 119/65 mmHg - - 96  - - -   Pre op weight  95.7 kg Current Weight  03/03/12 214 lb 9.6 oz (97.342 kg)      Intake/Output from previous day: 07/20 0701 - 07/21 0700 In: 1032.3 [P.O.:360; I.V.:672.3] Out: 850 [Urine:850]   Physical Exam:  Cardiovascular: IRRR, IRRR; no murmurs, gallops, or rubs. Pulmonary: Clear to auscultation bilaterally; no rales, wheezes, or rhonchi. Abdomen: Soft, non tender, some distention ,bowel sounds present. Extremities: Mild bilateral lower extremity edema. Wounds: Clean and dry.  No erythema or signs of infection. Neurologic:Grossly intact without focal deficits.  Lab Results: CBC:  Basename 03/02/12 0551 03/01/12 0400  WBC 10.5 9.6  HGB 9.8* 9.6*  HCT 29.0* 27.4*  PLT 113* 121*   BMET:   Basename 03/02/12 0551 03/01/12 0400  NA 136 138  K 4.0 3.9  CL 102 106  CO2 25 23  GLUCOSE 154* 123*  BUN 15 11  CREATININE 1.30 1.04  CALCIUM 8.2* 8.0*    PT/INR:  Lab Results  Component Value Date   INR 1.43 02/29/2012   INR 0.96 02/28/2012   INR 0.99 02/02/2011   ABG:  INR: Will add last result for INR, ABG once components are confirmed Will add last 4 CBG results once components are  confirmed  Assessment/Plan:  1. CV - Afib with RVR at 7:00 am 7/20. SR this am. Start Amiodarone po and stop Amiodarone gttp . Continue Lopressor 25 bid. 2.  Pulmonary - Encourage incentive spirometer. 3. Volume Overload - Continue with diuresis. 4.  Acute blood loss anemia - H and H stable this am at 9.8 and 29. 5.Thrombocytopenia-Platelets 113,000. 6.Gastroenterology-abd distention but no pain, N/V. Will monitor. 7.TSH 0.66. 8.Remove EPW. 9.LOC constipation.   ZIMMERMAN,DONIELLE MPA-C 03/03/2012  Patient seen and examined, agree with above.

## 2012-03-04 ENCOUNTER — Encounter (HOSPITAL_COMMUNITY): Payer: Self-pay | Admitting: Thoracic Surgery (Cardiothoracic Vascular Surgery)

## 2012-03-04 MED ORDER — METOPROLOL TARTRATE 50 MG PO TABS: 50.0000 mg | ORAL_TABLET | Freq: Two times a day (BID) | ORAL | Status: DC

## 2012-03-04 MED ORDER — METOPROLOL TARTRATE 50 MG PO TABS
50.0000 mg | ORAL_TABLET | Freq: Two times a day (BID) | ORAL | Status: DC
  Administered 2012-03-04 – 2012-03-05 (×3): 50 mg via ORAL
  Filled 2012-03-04 (×4): qty 1

## 2012-03-04 MED FILL — Sodium Chloride IV Soln 0.9%: INTRAVENOUS | Qty: 1000 | Status: AC

## 2012-03-04 MED FILL — Sodium Chloride Irrigation Soln 0.9%: Qty: 3000 | Status: AC

## 2012-03-04 MED FILL — Mannitol IV Soln 20%: INTRAVENOUS | Qty: 500 | Status: AC

## 2012-03-04 MED FILL — Electrolyte-R (PH 7.4) Solution: INTRAVENOUS | Qty: 4000 | Status: AC

## 2012-03-04 NOTE — Progress Notes (Addendum)
4 Days Post-Op Procedure(s) (LRB): CORONARY ARTERY BYPASS GRAFTING (CABG) (N/A) ENDARTERECTOMY CAROTID (Right) Subjective:  Mr. [name] [name] has no complaints this morning.  He denies nausea, vomiting, chest pain and shortness of breath. He is ambulating without difficulty +BM  Objective: Vital signs in last 24 hours: Temp:  [98.6 F (37 C)] 98.6 F (37 C) (07/22 0426) Pulse Rate:  [74-86] 74  (07/22 0426) Cardiac Rhythm:  [-]  Resp:  [18] 18  (07/22 0426) BP: (113-131)/(67-75) 131/75 mmHg (07/22 0426) SpO2:  [91 %-95 %] 95 % (07/22 0426) Weight:  [210 lb 4.8 oz (95.391 kg)] 210 lb 4.8 oz (95.391 kg) (07/22 0426)  Intake/Output from previous day: 07/21 0701 - 07/22 0700 In: 480 [P.O.:480] Out: 250 [Urine:250]  General appearance: alert, cooperative and no distress Heart: regular rate and rhythm Lungs: clear to auscultation bilaterally Abdomen: soft, non-tender; bowel sounds normal; no masses,  no organomegaly Extremities: edema trace Wound: clean and dry  Lab Results:  [facility] 03/02/12 0551  WBC 10.5  HGB 9.8*  HCT 29.0*  PLT 113*   BMET:  Basename 03/03/12 0630 03/02/12 0551  NA 137 136  K 3.9 4.0  CL 100 102  CO2 28 25  GLUCOSE 132* 154*  BUN 11 15  CREATININE 1.21 1.30  CALCIUM 8.2* 8.2*    PT/INR: No results found for this basename: LABPROT,INR in the last 72 hours ABG    Component Value Date/Time   PHART 7.405 02/29/2012 2002   HCO3 23.5 02/29/2012 2002   TCO2 21 02/29/2012 2006   ACIDBASEDEF 1.0 02/29/2012 2002   O2SAT 96.0 02/29/2012 2002   CBG (last 3)   Basename 03/01/12 1954 03/01/12 1606 03/01/12 1143  GLUCAP 141* 130* 138*    Assessment/Plan: S/P Procedure(s) (LRB): CORONARY ARTERY BYPASS GRAFTING (CABG) (N/A) ENDARTERECTOMY CAROTID (Right)  1. CV- Episodes of A. Fib, currently NSR on Lopressor, Amiodarone 2. Pulm- no acute issues, continue IS 3. Volume Overload- weight stable, on low dose Lasix 4. GI- abd distention improved, nausea,  vomiting resolved, +BM 5. Dispo- patient progressing, will d/c EPW this morning, currently in NSR.  If patient continues to progress could possibly be discharged home in next 24-48 hours   LOS: 4 days    BARRETT, ERIN 03/04/2012   I have seen and examined the patient and agree with the assessment and plan as outlined.  Increase metoprolol - was on fairly high dose preop.  OWEN,CLARENCE H 03/04/2012 8:36 AM

## 2012-03-04 NOTE — Progress Notes (Signed)
CARDIAC REHAB PHASE I   PRE:  Rate/Rhythm: 94SR PACs  BP:  Supine: 136/70  Sitting:   Standing:    SaO2: 93%RA  MODE:  Ambulation: 550 ft   POST:  Rate/Rhythem: 144ST runs of PACs, 111 ST PACs after return to bed  BP:  Supine: 149/79  Sitting:   Standing:    SaO2: 90-95%RA 1020-1051 Pt walked 550 ft on RA with rolling walker and asst x 1. Gait steady. No C/o palpitations. Heart rate to 144 ST with runs of PACs during walk. Pt had been sitting in recliner all morning so back to bed to rest. Tolerated well except for increased HR.  Duanne Limerick

## 2012-03-04 NOTE — Progress Notes (Signed)
EPWs pulled per protocol, no ectopy or other problem noted at this time.  Instructed on need for 1hr of bedrest.  Will continue to monitor.

## 2012-03-04 NOTE — Anesthesia Postprocedure Evaluation (Signed)
  Anesthesia Post-op Note  Patient: [name] [name]  Procedure(s) Performed: Procedure(s) (LRB): CORONARY ARTERY BYPASS GRAFTING (CABG) (N/A) ENDARTERECTOMY CAROTID (Right)  Patient Location: Nursing Unit  Anesthesia Type: General  Level of Consciousness: awake, alert  and oriented  Airway and Oxygen Therapy: Patient Spontanous Breathing and Patient connected to nasal cannula oxygen  Post-op Pain: none  Post-op Assessment: Post-op Vital signs reviewed, Patient's Cardiovascular Status Stable, Respiratory Function Stable, Patent Airway, No signs of Nausea or vomiting and Adequate PO intake  Post-op Vital Signs: Reviewed and stable  Complications: No apparent anesthesia complications

## 2012-03-05 MED FILL — Metoprolol Tartrate Tab 25 MG: ORAL | Qty: 1 | Status: AC

## 2012-03-05 MED FILL — Amiodarone HCl Tab 200 MG: ORAL | Qty: 2 | Status: AC

## 2012-03-05 MED FILL — Alprazolam Tab 0.25 MG: ORAL | Qty: 1 | Status: AC

## 2012-03-05 MED FILL — Atorvastatin Calcium Tab 20 MG (Base Equivalent): ORAL | Qty: 1 | Status: AC

## 2012-03-05 MED FILL — Sodium Chloride Flush IV Soln 0.9%: INTRAVENOUS | Qty: 3 | Status: AC

## 2012-03-05 MED FILL — Acetaminophen Tab 500 MG: ORAL | Qty: 2 | Status: AC

## 2012-03-05 NOTE — Progress Notes (Signed)
Patient refuses to wear BiPAP at this time.  Patient states that he is going home tomorrow and does not want to use BiPAP tonight.

## 2012-03-05 NOTE — Progress Notes (Addendum)
5 Days Post-Op Procedure(s) (LRB): CORONARY ARTERY BYPASS GRAFTING (CABG) (N/A) ENDARTERECTOMY CAROTID (Right)  Subjective: [name] [name] has no new complaints this morning.  He states he would like to go home today.  Objective: Vital signs in last 24 hours: Temp:  [98.8 F (37.1 C)-99 F (37.2 C)] 98.8 F (37.1 C) (07/23 0532) Pulse Rate:  [78-81] 78  (07/23 0532) Cardiac Rhythm:  [-] Normal sinus rhythm (07/23 0720) Resp:  [18-20] 20  (07/23 0532) BP: (130-148)/(71-82) 138/82 mmHg (07/23 0532) SpO2:  [92 %-94 %] 92 % (07/23 0532) Weight:  [209 lb 3.5 oz (94.9 kg)] 209 lb 3.5 oz (94.9 kg) (07/23 0058)  Intake/Output from previous day: 07/22 0701 - 07/23 0700 In: 720 [P.O.:720] Out: 1051 [Urine:1050; Stool:1]  General appearance: alert, cooperative and no distress Neurologic: intact Heart: regular rate and rhythm Lungs: clear to auscultation bilaterally Abdomen: soft, non-tender; bowel sounds normal; no masses,  no organomegaly Extremities: edema trace Wound: clean and dry  Lab Results: No results found for this basename: WBC:2,HGB:2,HCT:2,PLT:2 in the last 72 hours BMET:  Basename 03/03/12 0630  NA 137  K 3.9  CL 100  CO2 28  GLUCOSE 132*  BUN 11  CREATININE 1.21  CALCIUM 8.2*    PT/INR: No results found for this basename: LABPROT,INR in the last 72 hours ABG    Component Value Date/Time   PHART 7.405 02/29/2012 2002   HCO3 23.5 02/29/2012 2002   TCO2 21 02/29/2012 2006   ACIDBASEDEF 1.0 02/29/2012 2002   O2SAT 96.0 02/29/2012 2002   CBG (last 3)  No results found for this basename: GLUCAP:3 in the last 72 hours  Assessment/Plan: S/P Procedure(s) (LRB): CORONARY ARTERY BYPASS GRAFTING (CABG) (N/A) ENDARTERECTOMY CAROTID (Right)  1. CV- Previous A. Fib, on lopressor dose increased yesterday and Amiodarone 2. Pulm- no acute issues, continue IS 3. Volume overload- weight is stable, on lasix, will give short course at discharge 4. GI- nausea, vomiting resolved  + BM 5. Dispo- patient doing well, will d/c home today   LOS: 5 days    [name], [name] 03/05/2012   I have seen and examined the patient and agree with the assessment and plan as outlined.  [name] [name] 03/05/2012 8:14 AM

## 2012-03-05 NOTE — Discharge Summary (Signed)
Physician Discharge Summary  Patient ID: [name] [name] MRN: [id number] DOB/AGE: [birth year] 71 y.o.  Admit date: 02/29/2012 Discharge date: 03/05/2012  Admission Diagnoses:  Patient Active Problem List  Diagnosis  . Thyroid nodule, uninodular  . Hypothyroidism, postsurgical  . HTN (hypertension)  . Hyperlipemia  . GERD (gastroesophageal reflux disease)  . CAD (coronary artery disease)  . MI (myocardial infarction)  . Heart attack  . Stenosis of right carotid artery   Discharge Diagnoses:   Patient Active Problem List  Diagnosis  . Thyroid nodule, uninodular  . Hypothyroidism, postsurgical  . HTN (hypertension)  . Hyperlipemia  . GERD (gastroesophageal reflux disease)  . CAD (coronary artery disease)  . MI (myocardial infarction)  . Heart attack  . Stenosis of right carotid artery  . S/P CABG x 3  . S/P carotid endarterectomy    Discharged Condition: good  History of Present Illness:    [name] [name] is a 71 year old gentleman with known history of coronary artery disease, hypertension, hyperlipidemia, asymptomatic severe right carotid artery stenosis, and obstructive sleep apnea. The patient's cardiac history dates back to 1997 at which time he suffered an acute myocardial infarction. He was treated with PCI and stenting at [facility] in [place]. Details of that event are not available.  Approximately 2 years ago he had a syncopal episode. He went back to see his cardiologist in [place] who did not perform a diagnostic tests but subsequently referred him to a neurologist. He apparently had several tests performed including MRI of the brain, but he never had any follow up from the neurologists nor any other physicians in [place]. 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 The patient ultimately underwent thyroidectomy hear in [place].The patient has been followed by Dr. Edilia Bo due to the presence of  known asymptomatic cerebrovascular disease with bilateral internal carotid artery stenosis. Most recent carotid duplex scan demonstrates severe (80-99%) right carotid artery stenosis. He has been evaluated for possible elective right carotid endarterectomy.  During this time the patient reports that he has developed exertional shortness of breath with increased fatigue and decreased energy. He asked his primary care physician to refer him to a new cardiologist and he was seen in consultation by Dr. Alanda Amass. Due to his history and current symptoms an exercise Myoview scan was performed demonstrating high-risk findings with 2 mm ST segment depression on electrocardiogram and lateral wall ischemia on nuclear imaging. The patient subsequently underwent elective cardiac catheterization by Dr. Tresa Endo on July 9. He was found to have multivessel CAD with involvement of left main.  Due to these findings he was referred to [facility] for possible coronary bypass procedure.  He was evaluated by Dr. Cornelius Moras on 02/24/2012 at which time it was felt the would be a good candidate for coronary artery bypass performed along with a carotid endarterectomy.  The risks and benefits of the procedure were explained to the patient and he was agreeable to proceed with surgery which was tentatively scheduled for 02/29/2012.  Hospital Course:   Mr. Preast presented to [facility] on 02/29/2012.  He was taken to the operating room and underwent a right sided Carotid Endarterectomy, CABG x3 utilizing LIMA to LAD, SVG to OM2 and SVG to PDA and Endoscopic saphenous vein harvest from his right thigh to mid calf.  The patient tolerated the procedure well and was taken to the SICU in stable condition.  POD #0 the patient was extubated.  POD #1 the patient's  JP drain was removed from his neck incision.  His chest tubes and arterial lines were removed without difficulty.  He was medically stable and transferred to the step down unit in stable  condition.  POD #2 chest xray did not reveal any evidence of pneumothorax after chest tube removal.  He developed Atrial fibrillation with rapid ventricular response and he was placed on and amiodarone drip.  POD #3 the patient was in NSR.  His amiodarone drip was discontinued and he was placed on an oral regimen.  POD #4 patient continued to sustain NSR.  His pacing wires were removed without difficulty.  POD #5 the patient was doing well.  He was ambulating without difficulty and tolerating a cardiac diet.  He was medically stable and was discharged home today.  He will need to follow up with Dr. Cornelius Moras in 3 weeks with a chest xray prior to his appointment.  He will also need to follow up with Vascular surgery in 2 weeks time.  These offices will contact the patient with appointment information.  He will also need to schedule a follow up appointment with his cardiologist.  Significant Diagnostic Studies: cardiac catheterization  Significant marked coronary calcification of LM, LAD, LCX, RCA.  LM: calcified with ostial taper to 60 - 70% with 10 -15 mmhg pressure dampening with 5 fr catheter  LAD 50% septal  LCX: 40% prox, 70% OM1  RCA: occluded at origin with left to PDA collaterals  LV fxv: EF 55% min posterobasal inferior hypokinesis    Treatments: surgery:   Coronary Artery Bypass Grafting x 3  Left Internal Mammary Artery to Distal Left Anterior Descending Coronary Artery  Saphenous Vein Graft to Posterior Descending Coronary Artery  Saphenous Vein Graft to Second Obtuse Marginal Branch of Left Circumflex Coronary Artery  Endoscopic Vein Harvest from Right Thigh and Lower Leg  Right carotid endarterectomy with primary closure   Disposition: 01-Home or Self Care  Discharge Orders    Future Appointments: Provider: Department: Dept Phone: Center:   03/20/2012 2:15 PM Chuck Hint, MD [facility] [phone] [facility]   03/25/2012 4:30 PM Purcell Nails, MD [facility] [phone]  [facility]     Future Orders Please Complete By Expires   Amb Referral to Cardiac Rehabilitation        Medication List  As of 03/05/2012  5:55 PM   STOP taking these medications         amLODipine-valsartan 5-160 MG per tablet      diclofenac 75 MG EC tablet      isosorbide mononitrate 30 MG 24 hr tablet      metoprolol succinate 100 MG 24 hr tablet      simvastatin 40 MG tablet         TAKE these medications         amiodarone 400 MG tablet   Commonly known as: PACERONE   Take 1 tablet (400 mg total) by mouth 2 (two) times daily. For one week then take Amiodarone 400 mg po daily thereafter.      aspirin 325 MG EC tablet   Take 1 tablet (325 mg total) by mouth daily.      atorvastatin 20 MG tablet   Commonly known as: LIPITOR   Take 1 tablet (20 mg total) by mouth daily at 6 PM.      calcium carbonate 600 MG Tabs   Commonly known as: OS-CAL   Take 600 mg by mouth daily.      cholecalciferol  1000 UNITS tablet   Commonly known as: VITAMIN D   Take 1,000 Units by mouth daily.      Co Q-10 100 MG Caps   Take 100 mg by mouth daily.      Fish Oil 1000 MG Cpdr   Take 1,000 mg by mouth 2 (two) times daily.      folic acid 400 MCG tablet   Commonly known as: FOLVITE   Take 400 mcg by mouth daily.      furosemide 40 MG tablet   Commonly known as: LASIX   Take 1 tablet (40 mg total) by mouth daily. For 5 days then stop.      METAMUCIL PO   Take 3 capsules by mouth daily.      metoprolol 50 MG tablet   Commonly known as: LOPRESSOR   Take 1 tablet (50 mg total) by mouth 2 (two) times daily.      multivitamin with minerals Tabs   Take 1 tablet by mouth daily.      niacin 500 MG tablet   Take 500 mg by mouth daily with breakfast.      omeprazole 20 MG capsule   Commonly known as: PRILOSEC   Take 20 mg by mouth daily.      Osteo Bi-Flex Adv Double St Tabs   Take 1 tablet by mouth 2 (two) times daily.      oxyCODONE 5 MG immediate release tablet   Commonly known  as: Oxy IR/ROXICODONE   Take 1-2 tablets (5-10 mg total) by mouth every 4 (four) hours as needed for pain.      potassium chloride SA 20 MEQ tablet   Commonly known as: K-DUR,KLOR-CON   Take 1 tablet (20 mEq total) by mouth daily. For 5 days then stop.      SYNTHROID 125 MCG tablet   Generic drug: levothyroxine   Take 1 tablet (125 mcg total) by mouth daily.           Follow-up Information    Follow up with Purcell Nails, MD. (PA/LAT CXR to be taken (at [facility] which is in the same building as Dr. Orvan July office) 45 minutes prior to office appointment;Office will call with an appointment date and time )    Contact information:   [address] [place] [address] [phone]       Follow up with Lennette Bihari, MD. (Call for a follow up appointment for 2 weeks)    Contact information:   [address] [place] [address] [phone]       Follow up with Chuck Hint, MD. (Call for a follow up appointment 3-4 weeks)    Contact information:   [address] [place] [address] [phone]          Signed: Lowella Dandy 03/05/2012, 5:55 PM

## 2012-03-05 NOTE — Progress Notes (Signed)
[id number] Education completed with pt and wife. Permission given to refer to [facility] Phase 2. Put on d/c video for pt to watch.Mackinley Kiehn DunlapRN

## 2012-03-05 NOTE — Discharge Summary (Signed)
I agree with the above discharge summary and plan for follow-up.  [name]  

## 2012-03-12 ENCOUNTER — Telehealth (INDEPENDENT_AMBULATORY_CARE_PROVIDER_SITE_OTHER): Payer: Self-pay

## 2012-03-12 NOTE — Telephone Encounter (Signed)
Per Dr Ardine Eng recent note in epic refill request for synthroid faxed to [facility] to contact Dr Dwana Melena pts PcP for refill request.

## 2012-03-19 ENCOUNTER — Encounter: Payer: Self-pay | Admitting: Vascular Surgery

## 2012-03-20 ENCOUNTER — Ambulatory Visit (INDEPENDENT_AMBULATORY_CARE_PROVIDER_SITE_OTHER): Payer: Medicare Other | Admitting: Vascular Surgery

## 2012-03-20 ENCOUNTER — Encounter: Payer: Self-pay | Admitting: Vascular Surgery

## 2012-03-20 VITALS — BP 134/81 | HR 60 | Temp 98.7°F | Ht 71.0 in | Wt 202.0 lb

## 2012-03-20 DIAGNOSIS — I6529 Occlusion and stenosis of unspecified carotid artery: Secondary | ICD-10-CM

## 2012-03-20 NOTE — Progress Notes (Signed)
[facility]  Patient name: [name] [name] MRN: [id number] DOB: 02-25-1941 Sex: male  REASON FOR VISIT: follow up after right carotid endarterectomy with CABG  HPI: [name] [name] is a 71 y.o. male who I been following with carotid disease. He had a greater than 80% right carotid stenosis with a 60-79% left carotid stenosis. Preoperative cardiac workup and was found to have significant coronary artery disease. We therefore elected to proceed with combined right carotid endarterectomy with CABG by Dr. Cornelius Moras. He underwent right carotid endarterectomy with primary closure on 02/29/2012 and then CABG. He did well postoperatively returns for his first postoperative visit. He said no focal weakness or paresthesias. He said no chest pain or chest pressure.   REVIEW OF SYSTEMS: Arly.Keller ] denotes positive finding; [  ] denotes negative finding  CARDIOVASCULAR:  [ ]  chest pain   [ ]  dyspnea on exertion    CONSTITUTIONAL:  [ ]  fever   [ ]  chills  PHYSICAL EXAM: Filed Vitals:   03/20/12 1359 03/20/12 1400  BP: 151/83 134/81  Pulse: 61 60  Temp: 98.7 F (37.1 C)   TempSrc: Oral   Height: 5\' 11"  (1.803 m)   Weight: 202 lb (91.627 kg)   SpO2: 98%    Body mass index is 28.17 kg/(m^2). GENERAL: The patient is a well-nourished male, in no acute distress. The vital signs are documented above. CARDIOVASCULAR: There is a regular rate and rhythm  PULMONARY: There is good air exchange bilaterally without wheezing or rales. His right neck incision is healing nicely. He has no focal weakness or paresthesias.  MEDICAL ISSUES: Overall pleased with his progress now see him back in 6 months with follow duplex scan. We'll need to continue to follow his 60-79% left carotid stenosis. He understands that the stenosis progresses to greater than 80% we would need to consider left carotid endarterectomy. He is on aspirin. He is not a smoker. His blood pressure and cholesterol are followed  closely by his primary care physician. I'll see him back in 6 months. He knows to call sooner if he has problems.  [name] [name] S [facility] [place] Beeper: [phone]

## 2012-03-20 NOTE — Addendum Note (Signed)
Addended by: Sharee Pimple on: 03/20/2012 03:17 PM   Modules accepted: Orders

## 2012-03-25 ENCOUNTER — Ambulatory Visit
Admission: RE | Admit: 2012-03-25 | Discharge: 2012-03-25 | Disposition: A | Discharge status: Home / Self Care | Payer: Medicare Other | Source: Ambulatory Visit | Attending: Thoracic Surgery (Cardiothoracic Vascular Surgery) | Admitting: Thoracic Surgery (Cardiothoracic Vascular Surgery)

## 2012-03-25 ENCOUNTER — Other Ambulatory Visit: Payer: Self-pay | Admitting: Thoracic Surgery (Cardiothoracic Vascular Surgery)

## 2012-03-25 ENCOUNTER — Ambulatory Visit (INDEPENDENT_AMBULATORY_CARE_PROVIDER_SITE_OTHER): Payer: Self-pay | Admitting: Thoracic Surgery (Cardiothoracic Vascular Surgery)

## 2012-03-25 ENCOUNTER — Encounter: Payer: Self-pay | Admitting: Thoracic Surgery (Cardiothoracic Vascular Surgery)

## 2012-03-25 VITALS — BP 139/81 | HR 68 | Resp 18 | Ht 73.0 in | Wt 201.0 lb

## 2012-03-25 DIAGNOSIS — I2510 Atherosclerotic heart disease of native coronary artery without angina pectoris: Secondary | ICD-10-CM

## 2012-03-25 DIAGNOSIS — Z951 Presence of aortocoronary bypass graft: Secondary | ICD-10-CM

## 2012-03-25 IMAGING — CR DG CHEST 2V
2 series · 2 of 2 positions shown · non-contrast
Comparison: PA and lateral chest [DATE].

CLINICAL DATA: Status post cardiac surgery [DATE].

CHEST - 2 VIEW

[view not recorded (1 of 2)]
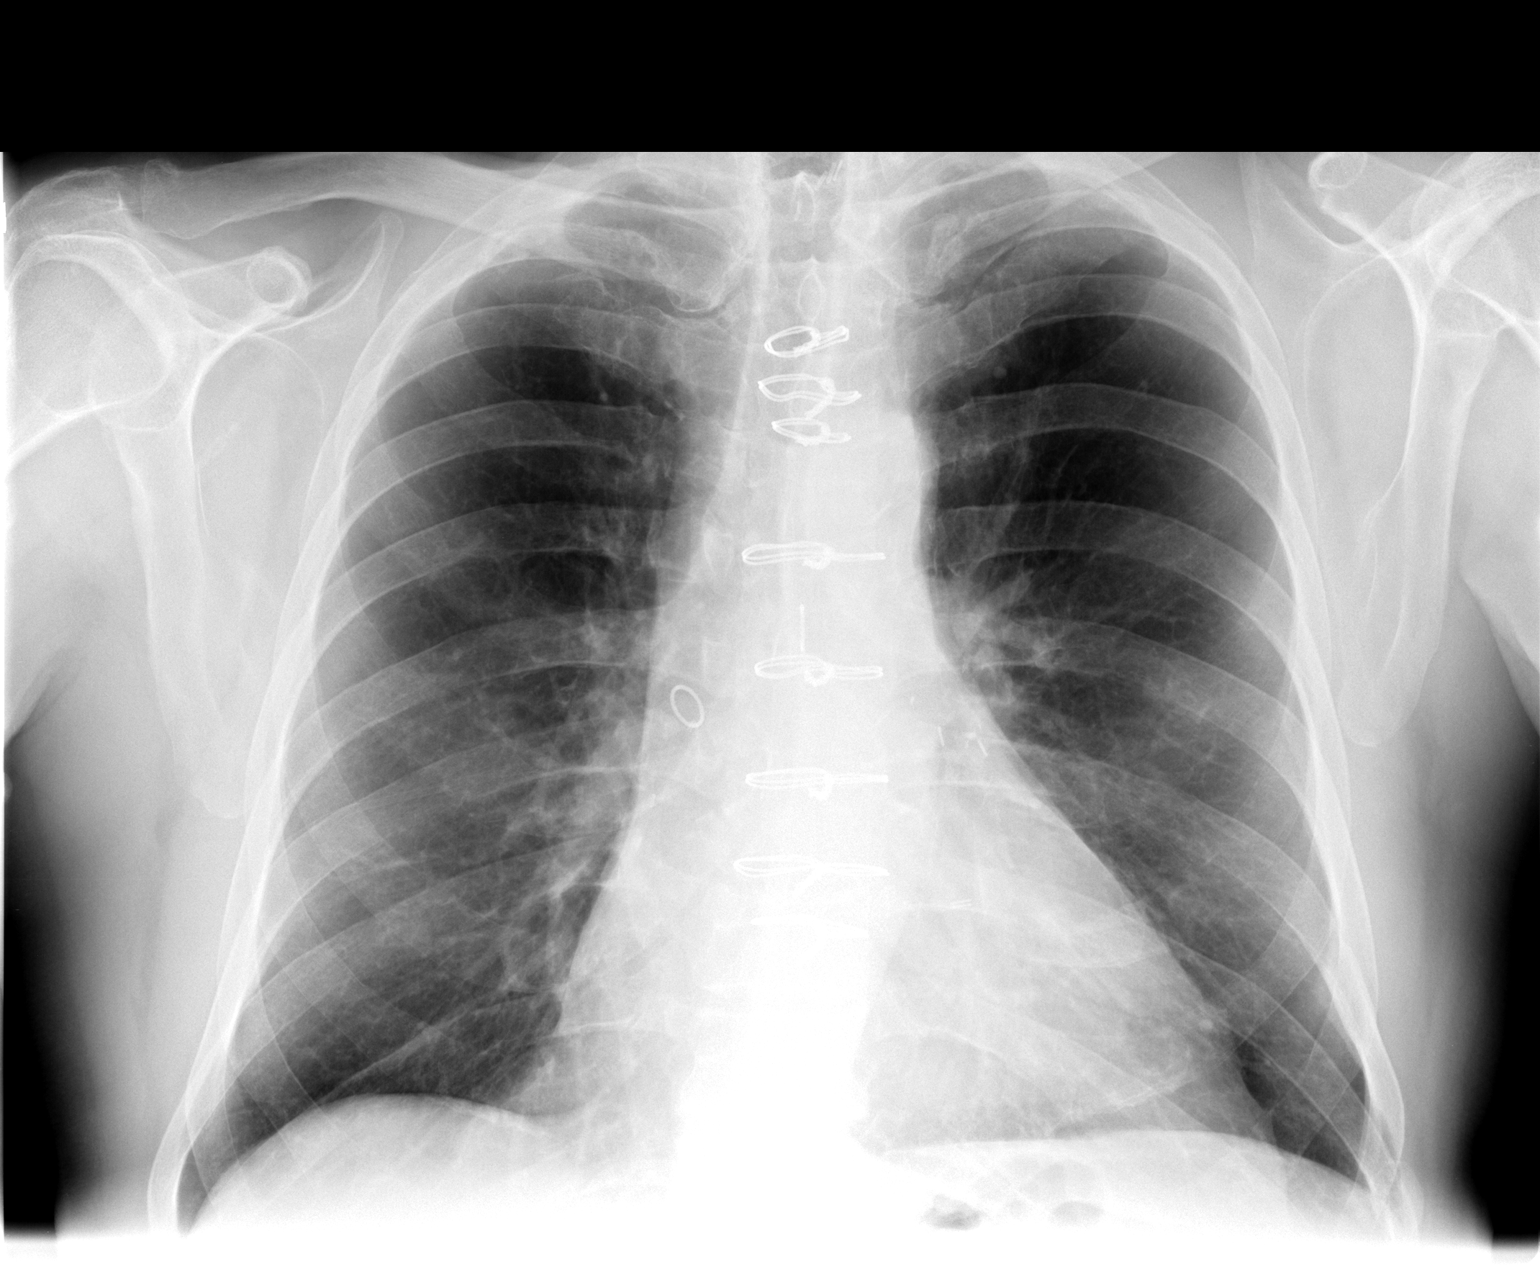

[view not recorded (2 of 2)]
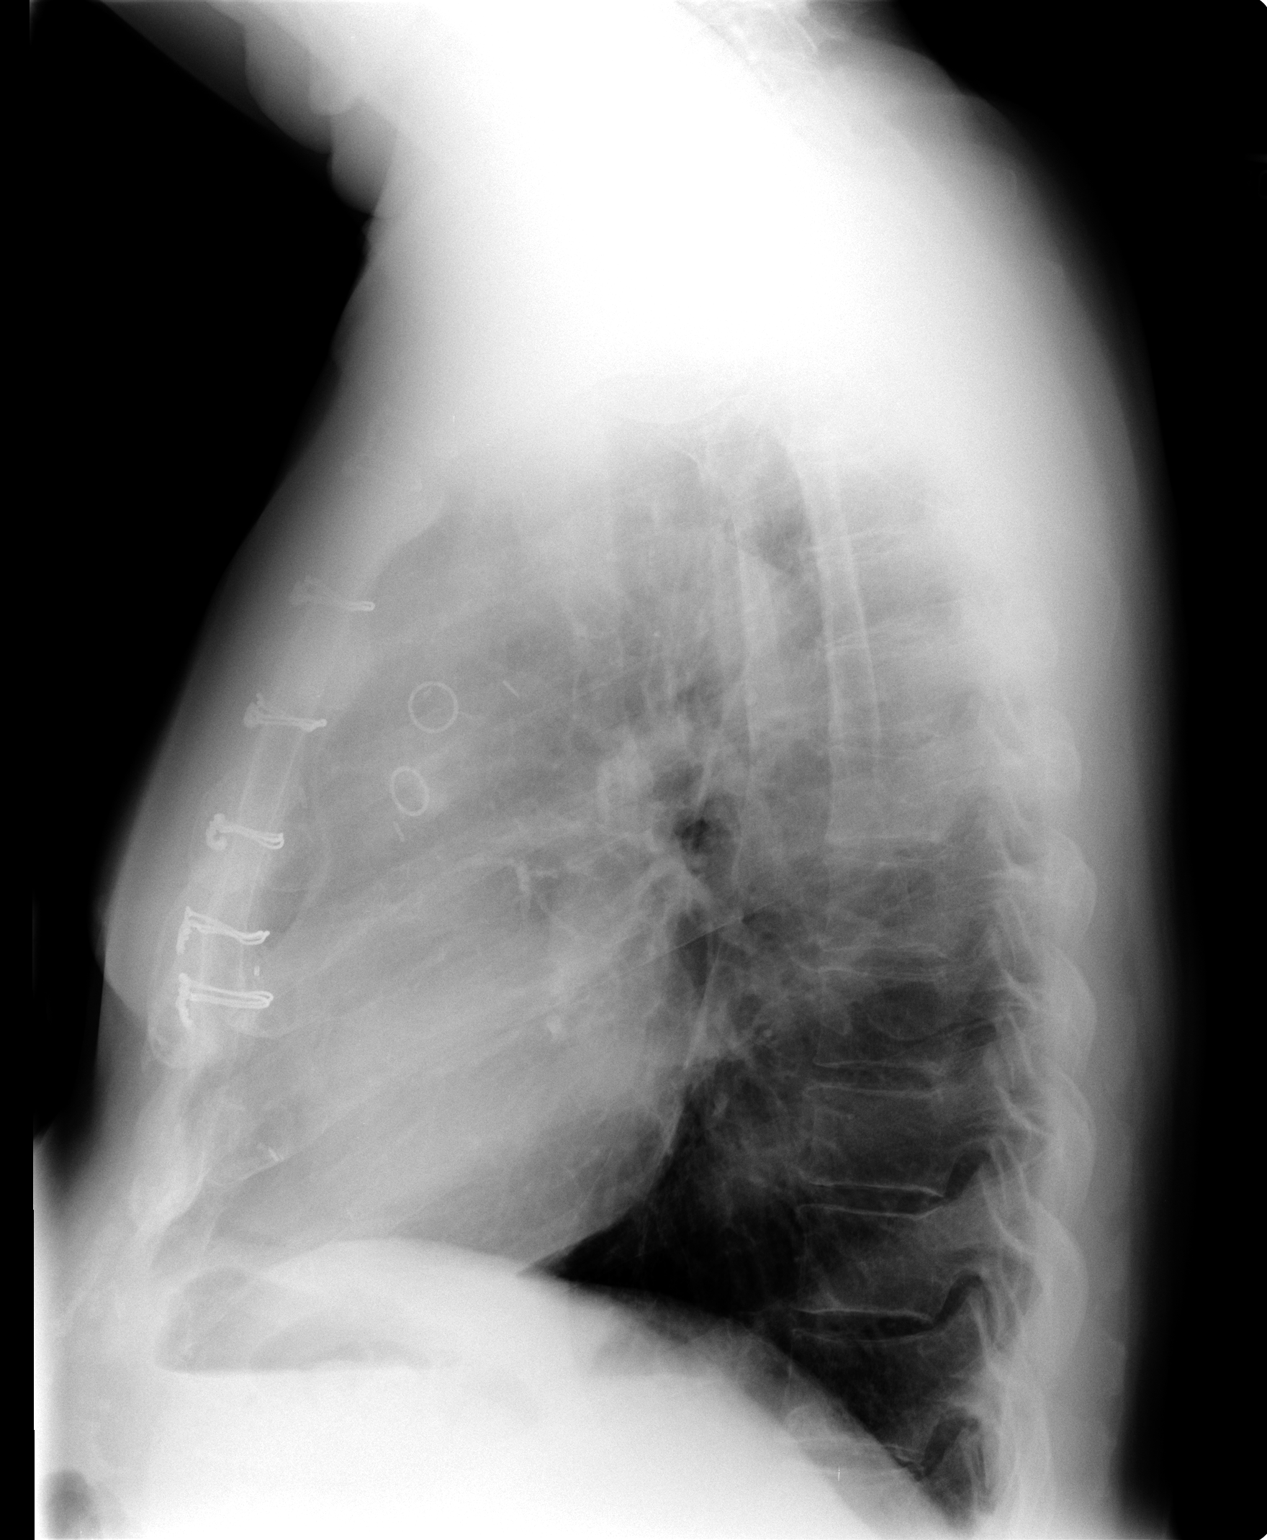

[2 of 2 positions shown; findings below may reference images not displayed]

FINDINGS: Bilateral pleural effusions seen on the prior study have
resolved.  The lungs are clear.  Cardiomegaly is noted.  No
pneumothorax is identified.  The chest appears hyperexpanded.
IMPRESSION: Interval resolution of bilateral pleural effusions.  No acute
finding.

## 2012-03-25 NOTE — Progress Notes (Signed)
[address]            [place] [address]          [phone]     CARDIOTHORACIC SURGERY OFFICE NOTE  Referring Provider is Lennette Bihari, MD Primary Cardiologist is Susa Griffins, MD PCP is Dwana Melena, MD   HPI:  Patient returns for routine followup status post coronary artery bypass grafting x3 with concomitant right carotid endarterectomy on 02/29/2012.  His early postoperative recovery was notable for a brief period of atrial fibrillation for which she was treated with amiodarone. His postoperative recovery has been otherwise uncomplicated. Since hospital discharge she has continued to improve. Initially he reports that he did not have a very good appetite. Apparently there was some confusion about amiodarone dosing instructions, but he is now taking only 200 mg daily. He has been instructed to taper this to 100 mg daily by Dr. Alanda Amass. Otherwise the patient is recovering very nicely. He is walking 2 miles a day and he notes that his breathing is better than it was prior to surgery. He still has mild soreness in his chest but he is no longer taking any sort of pain relievers. He is still not sleeping very well at night, but this seems to be improving. His appetite is improving. He has no other complaints.   Current Outpatient Prescriptions  Medication Sig Dispense Refill  . amiodarone (PACERONE) 400 MG tablet Take 400 mg by mouth daily. For one week then take Amiodarone 400 mg po daily then 200 mg thereafter. (starting 03/19/2012)      . aspirin 325 MG tablet Take 325 mg by mouth daily.      Marland Kitchen atorvastatin (LIPITOR) 20 MG tablet Take 1 tablet (20 mg total) by mouth daily at 6 PM.  30 tablet  1  . calcium carbonate (OS-CAL) 600 MG TABS Take 600 mg by mouth daily.      . cholecalciferol (VITAMIN D) 1000 UNITS tablet Take 1,000 Units by mouth daily.      . Coenzyme Q10 (CO Q-10) 100 MG CAPS Take 100 mg by mouth daily.       . folic acid (FOLVITE)  400 MCG tablet Take 400 mcg by mouth daily.      . metoprolol (LOPRESSOR) 50 MG tablet Take 1 tablet (50 mg total) by mouth 2 (two) times daily.  60 tablet  1  . Misc Natural Products (OSTEO BI-FLEX ADV DOUBLE ST) TABS Take 1 tablet by mouth 2 (two) times daily.       . Multiple Vitamin (MULTIVITAMIN WITH MINERALS) TABS Take 1 tablet by mouth daily.      . niacin 500 MG tablet Take 500 mg by mouth daily with breakfast.      . Omega-3 Fatty Acids (FISH OIL) 1000 MG CPDR Take 1,000 mg by mouth 2 (two) times daily.       . Probiotic Product (PROBIOTIC DAILY PO) Take by mouth 1 day or 1 dose.      . Psyllium (METAMUCIL PO) Take 3 capsules by mouth daily.       Marland Kitchen SYNTHROID 125 MCG tablet Take 1 tablet (125 mcg total) by mouth daily.  30 tablet  6      Physical Exam:   BP 139/81  Pulse 68  Resp 18  Ht 6\' 1"  (1.854 m)  Wt 201 lb (91.173 kg)  BMI 26.52 kg/m2  SpO2 96%  General:  Well-appearing  Chest:   Clear to auscultation with symmetrical breath sounds  CV:   Regular rate and rhythm without murmur  Incisions:  Clean and dry and healing nicely, sternum is stable  Abdomen:  Soft and nontender  Extremities:  Warm and well-perfused with no lower extremity edema  Diagnostic Tests:   *RADIOLOGY REPORT*  Clinical Data: Status post cardiac surgery July, 2013.  CHEST - 2 VIEW  Comparison: PA and lateral chest 03/02/2012.  Findings: Bilateral pleural effusions seen on the prior study have  resolved. The lungs are clear. Cardiomegaly is noted. No  pneumothorax is identified. The chest appears hyperexpanded.  IMPRESSION:  Interval resolution of bilateral pleural effusions. No acute  finding.  Original Report Authenticated By: Bernadene Bell. Maricela Curet, M.D.   Impression:  Patient is doing well following coronary artery bypass grafting x3 with concomitant right carotid endarterectomy.  Plan:  I've encouraged patient to continue to gradually increase his physical activity as tolerated with  his primary limitation remaining that he refrain from any sort of heavy lifting or strenuous use of his arms or shoulders for least another 2 months. I've encouraged him to enroll in the cardiac rehabilitation program. I think it is reasonable for him to resume driving an automobile. I've instructed him to stop taking amiodarone in 2 weeks after his dose has been cut to 100 mg daily. He is now far enough out from surgery that the likelihood of recurrence should be extremely small and seems as though the amiodarone may still be contributing to his marginal appetite. Overall he seems to be doing very nicely. We will plan to see him back in 2 months for routine followup.   Salvatore Decent. Cornelius Moras, MD 03/25/2012 4:06 PM

## 2012-03-25 NOTE — Patient Instructions (Signed)
The patient has been reminded to continue to avoid any heavy lifting or strenuous use of arms or shoulders for at least a total of three months from the time of surgery. The patient has been instructed that they may return driving an automobile as long as they are no longer requiring oral narcotic pain relievers during the daytime.  They have been advised to start driving short distances during the daylight and gradually increase from there as they feel comfortable. Decrease amiodarone to 200 mg daily x 1 week then stop amiodarone

## 2012-04-11 ENCOUNTER — Encounter (HOSPITAL_COMMUNITY)
Admission: RE | Admit: 2012-04-11 | Discharge: 2012-04-11 | Disposition: A | Discharge status: Home / Self Care | Payer: Medicare Other | Source: Ambulatory Visit | Attending: Cardiovascular Disease | Admitting: Cardiovascular Disease

## 2012-04-11 ENCOUNTER — Encounter (HOSPITAL_COMMUNITY): Payer: Self-pay

## 2012-04-11 VITALS — BP 132/90 | HR 57 | Ht 73.0 in | Wt 198.4 lb

## 2012-04-11 DIAGNOSIS — I25810 Atherosclerosis of coronary artery bypass graft(s) without angina pectoris: Secondary | ICD-10-CM

## 2012-04-11 NOTE — Progress Notes (Addendum)
Patient was referred to Cardiac Rehab by Dr. Tresa Endo due to post CABGx3 414.04. During orientation advised patient on arrival and appointment times what to wear, what to do before, during and after exercise. Reviewed attendance and class policy. Talked about inclement weather and class consultation policy. Pt is scheduled to start Cardiac Rehab on 04/17/12 at 11 am. Pt was advised to come to class 5 minutes before class starts. He was also given instructions on meeting with the dietician and attending the Family Structure classes. Pt is eager to get started.

## 2012-04-11 NOTE — Patient Instructions (Addendum)
Pt has finished orientation and is scheduled to start CR on 04/17/12 at 11 am. Pt has been instructed to arrive to class 15 minutes early for scheduled class. Pt has been instructed to wear comfortable clothing and shoes with rubber soles. Pt has been told to take their medications 1 hour prior to coming to class.  If the patient is not going to attend class, he/she has been instructed to call.

## 2012-04-17 ENCOUNTER — Encounter (HOSPITAL_COMMUNITY)
Admission: RE | Admit: 2012-04-17 | Discharge: 2012-04-17 | Disposition: A | Discharge status: Home / Self Care | Payer: Medicare Other | Source: Ambulatory Visit | Attending: Cardiovascular Disease | Admitting: Cardiovascular Disease

## 2012-04-17 DIAGNOSIS — I2510 Atherosclerotic heart disease of native coronary artery without angina pectoris: Secondary | ICD-10-CM | POA: Insufficient documentation

## 2012-04-17 DIAGNOSIS — Z951 Presence of aortocoronary bypass graft: Secondary | ICD-10-CM | POA: Insufficient documentation

## 2012-04-17 DIAGNOSIS — Z5189 Encounter for other specified aftercare: Secondary | ICD-10-CM | POA: Insufficient documentation

## 2012-04-19 ENCOUNTER — Encounter (HOSPITAL_COMMUNITY)
Admission: RE | Admit: 2012-04-19 | Discharge: 2012-04-19 | Disposition: A | Discharge status: Home / Self Care | Payer: Medicare Other | Source: Ambulatory Visit | Attending: Cardiovascular Disease | Admitting: Cardiovascular Disease

## 2012-04-22 ENCOUNTER — Encounter (HOSPITAL_COMMUNITY)
Admission: RE | Admit: 2012-04-22 | Discharge: 2012-04-22 | Disposition: A | Discharge status: Home / Self Care | Payer: Medicare Other | Source: Ambulatory Visit | Attending: Cardiovascular Disease | Admitting: Cardiovascular Disease

## 2012-04-24 ENCOUNTER — Encounter (HOSPITAL_COMMUNITY)
Admission: RE | Admit: 2012-04-24 | Discharge: 2012-04-24 | Disposition: A | Discharge status: Home / Self Care | Payer: Medicare Other | Source: Ambulatory Visit | Attending: Cardiovascular Disease | Admitting: Cardiovascular Disease

## 2012-04-26 ENCOUNTER — Encounter (HOSPITAL_COMMUNITY)
Admission: RE | Admit: 2012-04-26 | Discharge: 2012-04-26 | Disposition: A | Discharge status: Home / Self Care | Payer: Medicare Other | Source: Ambulatory Visit | Attending: Cardiovascular Disease | Admitting: Cardiovascular Disease

## 2012-04-29 ENCOUNTER — Encounter (HOSPITAL_COMMUNITY)
Admission: RE | Admit: 2012-04-29 | Discharge: 2012-04-29 | Disposition: A | Discharge status: Home / Self Care | Payer: Medicare Other | Source: Ambulatory Visit | Attending: Cardiovascular Disease | Admitting: Cardiovascular Disease

## 2012-05-01 ENCOUNTER — Encounter (HOSPITAL_COMMUNITY)
Admission: RE | Admit: 2012-05-01 | Discharge: 2012-05-01 | Disposition: A | Discharge status: Home / Self Care | Payer: Medicare Other | Source: Ambulatory Visit | Attending: Cardiovascular Disease | Admitting: Cardiovascular Disease

## 2012-05-03 ENCOUNTER — Encounter (HOSPITAL_COMMUNITY): Payer: Medicare Other

## 2012-05-06 ENCOUNTER — Encounter (HOSPITAL_COMMUNITY)
Admission: RE | Admit: 2012-05-06 | Discharge: 2012-05-06 | Disposition: A | Discharge status: Home / Self Care | Payer: Medicare Other | Source: Ambulatory Visit | Attending: Cardiovascular Disease | Admitting: Cardiovascular Disease

## 2012-05-08 ENCOUNTER — Encounter (HOSPITAL_COMMUNITY)
Admission: RE | Admit: 2012-05-08 | Discharge: 2012-05-08 | Disposition: A | Discharge status: Home / Self Care | Payer: Medicare Other | Source: Ambulatory Visit | Attending: Cardiovascular Disease | Admitting: Cardiovascular Disease

## 2012-05-10 ENCOUNTER — Encounter (HOSPITAL_COMMUNITY)
Admission: RE | Admit: 2012-05-10 | Discharge: 2012-05-10 | Disposition: A | Discharge status: Home / Self Care | Payer: Medicare Other | Source: Ambulatory Visit | Attending: Cardiovascular Disease | Admitting: Cardiovascular Disease

## 2012-05-13 ENCOUNTER — Encounter (HOSPITAL_COMMUNITY)
Admission: RE | Admit: 2012-05-13 | Discharge: 2012-05-13 | Disposition: A | Discharge status: Home / Self Care | Payer: Medicare Other | Source: Ambulatory Visit | Attending: Cardiovascular Disease | Admitting: Cardiovascular Disease

## 2012-05-15 ENCOUNTER — Encounter (HOSPITAL_COMMUNITY)
Admission: RE | Admit: 2012-05-15 | Discharge: 2012-05-15 | Disposition: A | Discharge status: Home / Self Care | Payer: Medicare Other | Source: Ambulatory Visit | Attending: Cardiovascular Disease | Admitting: Cardiovascular Disease

## 2012-05-15 DIAGNOSIS — I2510 Atherosclerotic heart disease of native coronary artery without angina pectoris: Secondary | ICD-10-CM | POA: Insufficient documentation

## 2012-05-15 DIAGNOSIS — Z5189 Encounter for other specified aftercare: Secondary | ICD-10-CM | POA: Insufficient documentation

## 2012-05-15 DIAGNOSIS — Z951 Presence of aortocoronary bypass graft: Secondary | ICD-10-CM | POA: Insufficient documentation

## 2012-05-17 ENCOUNTER — Encounter (HOSPITAL_COMMUNITY)
Admission: RE | Admit: 2012-05-17 | Discharge: 2012-05-17 | Disposition: A | Discharge status: Home / Self Care | Payer: Medicare Other | Source: Ambulatory Visit | Attending: Cardiovascular Disease | Admitting: Cardiovascular Disease

## 2012-05-20 ENCOUNTER — Encounter (HOSPITAL_COMMUNITY): Payer: Medicare Other

## 2012-05-22 ENCOUNTER — Encounter (HOSPITAL_COMMUNITY): Payer: Medicare Other

## 2012-05-24 ENCOUNTER — Encounter (HOSPITAL_COMMUNITY): Payer: Medicare Other

## 2012-05-27 ENCOUNTER — Encounter (HOSPITAL_COMMUNITY): Payer: Medicare Other

## 2012-05-27 ENCOUNTER — Ambulatory Visit: Payer: Medicare Other | Admitting: Thoracic Surgery (Cardiothoracic Vascular Surgery)

## 2012-05-29 ENCOUNTER — Encounter (HOSPITAL_COMMUNITY): Payer: Medicare Other

## 2012-05-31 ENCOUNTER — Encounter (HOSPITAL_COMMUNITY): Payer: Medicare Other

## 2012-06-03 ENCOUNTER — Encounter (HOSPITAL_COMMUNITY)
Admission: RE | Admit: 2012-06-03 | Discharge: 2012-06-03 | Disposition: A | Discharge status: Home / Self Care | Payer: Medicare Other | Source: Ambulatory Visit | Attending: Cardiovascular Disease | Admitting: Cardiovascular Disease

## 2012-06-05 ENCOUNTER — Encounter (HOSPITAL_COMMUNITY)
Admission: RE | Admit: 2012-06-05 | Discharge: 2012-06-05 | Disposition: A | Discharge status: Home / Self Care | Payer: Medicare Other | Source: Ambulatory Visit | Attending: Cardiovascular Disease | Admitting: Cardiovascular Disease

## 2012-06-06 ENCOUNTER — Other Ambulatory Visit: Payer: Self-pay | Admitting: Thoracic Surgery (Cardiothoracic Vascular Surgery)

## 2012-06-06 DIAGNOSIS — Z951 Presence of aortocoronary bypass graft: Secondary | ICD-10-CM

## 2012-06-06 DIAGNOSIS — I2510 Atherosclerotic heart disease of native coronary artery without angina pectoris: Secondary | ICD-10-CM

## 2012-06-07 ENCOUNTER — Encounter (HOSPITAL_COMMUNITY)
Admission: RE | Admit: 2012-06-07 | Discharge: 2012-06-07 | Disposition: A | Discharge status: Home / Self Care | Payer: Medicare Other | Source: Ambulatory Visit | Attending: Cardiovascular Disease | Admitting: Cardiovascular Disease

## 2012-06-10 ENCOUNTER — Encounter: Payer: Self-pay | Admitting: Surgical

## 2012-06-10 ENCOUNTER — Ambulatory Visit (INDEPENDENT_AMBULATORY_CARE_PROVIDER_SITE_OTHER): Payer: Medicare Other | Admitting: Thoracic Surgery (Cardiothoracic Vascular Surgery)

## 2012-06-10 ENCOUNTER — Encounter (HOSPITAL_COMMUNITY)
Admission: RE | Admit: 2012-06-10 | Discharge: 2012-06-10 | Disposition: A | Discharge status: Home / Self Care | Payer: Medicare Other | Source: Ambulatory Visit | Attending: Cardiovascular Disease | Admitting: Cardiovascular Disease

## 2012-06-10 ENCOUNTER — Ambulatory Visit
Admission: RE | Admit: 2012-06-10 | Discharge: 2012-06-10 | Disposition: A | Discharge status: Home / Self Care | Payer: Medicare Other | Source: Ambulatory Visit | Attending: Thoracic Surgery (Cardiothoracic Vascular Surgery) | Admitting: Thoracic Surgery (Cardiothoracic Vascular Surgery)

## 2012-06-10 VITALS — BP 141/85 | HR 57 | Resp 16 | Ht 73.0 in | Wt 194.8 lb

## 2012-06-10 DIAGNOSIS — Z951 Presence of aortocoronary bypass graft: Secondary | ICD-10-CM

## 2012-06-10 DIAGNOSIS — I2510 Atherosclerotic heart disease of native coronary artery without angina pectoris: Secondary | ICD-10-CM

## 2012-06-10 IMAGING — CR DG CHEST 2V
2 series · 2 of 2 positions shown · non-contrast
Comparison: [DATE]

CLINICAL DATA: Post CABG in [DATE].  Soreness and numbness in
the upper left chest.

CHEST - 2 VIEW

[w chest pa]
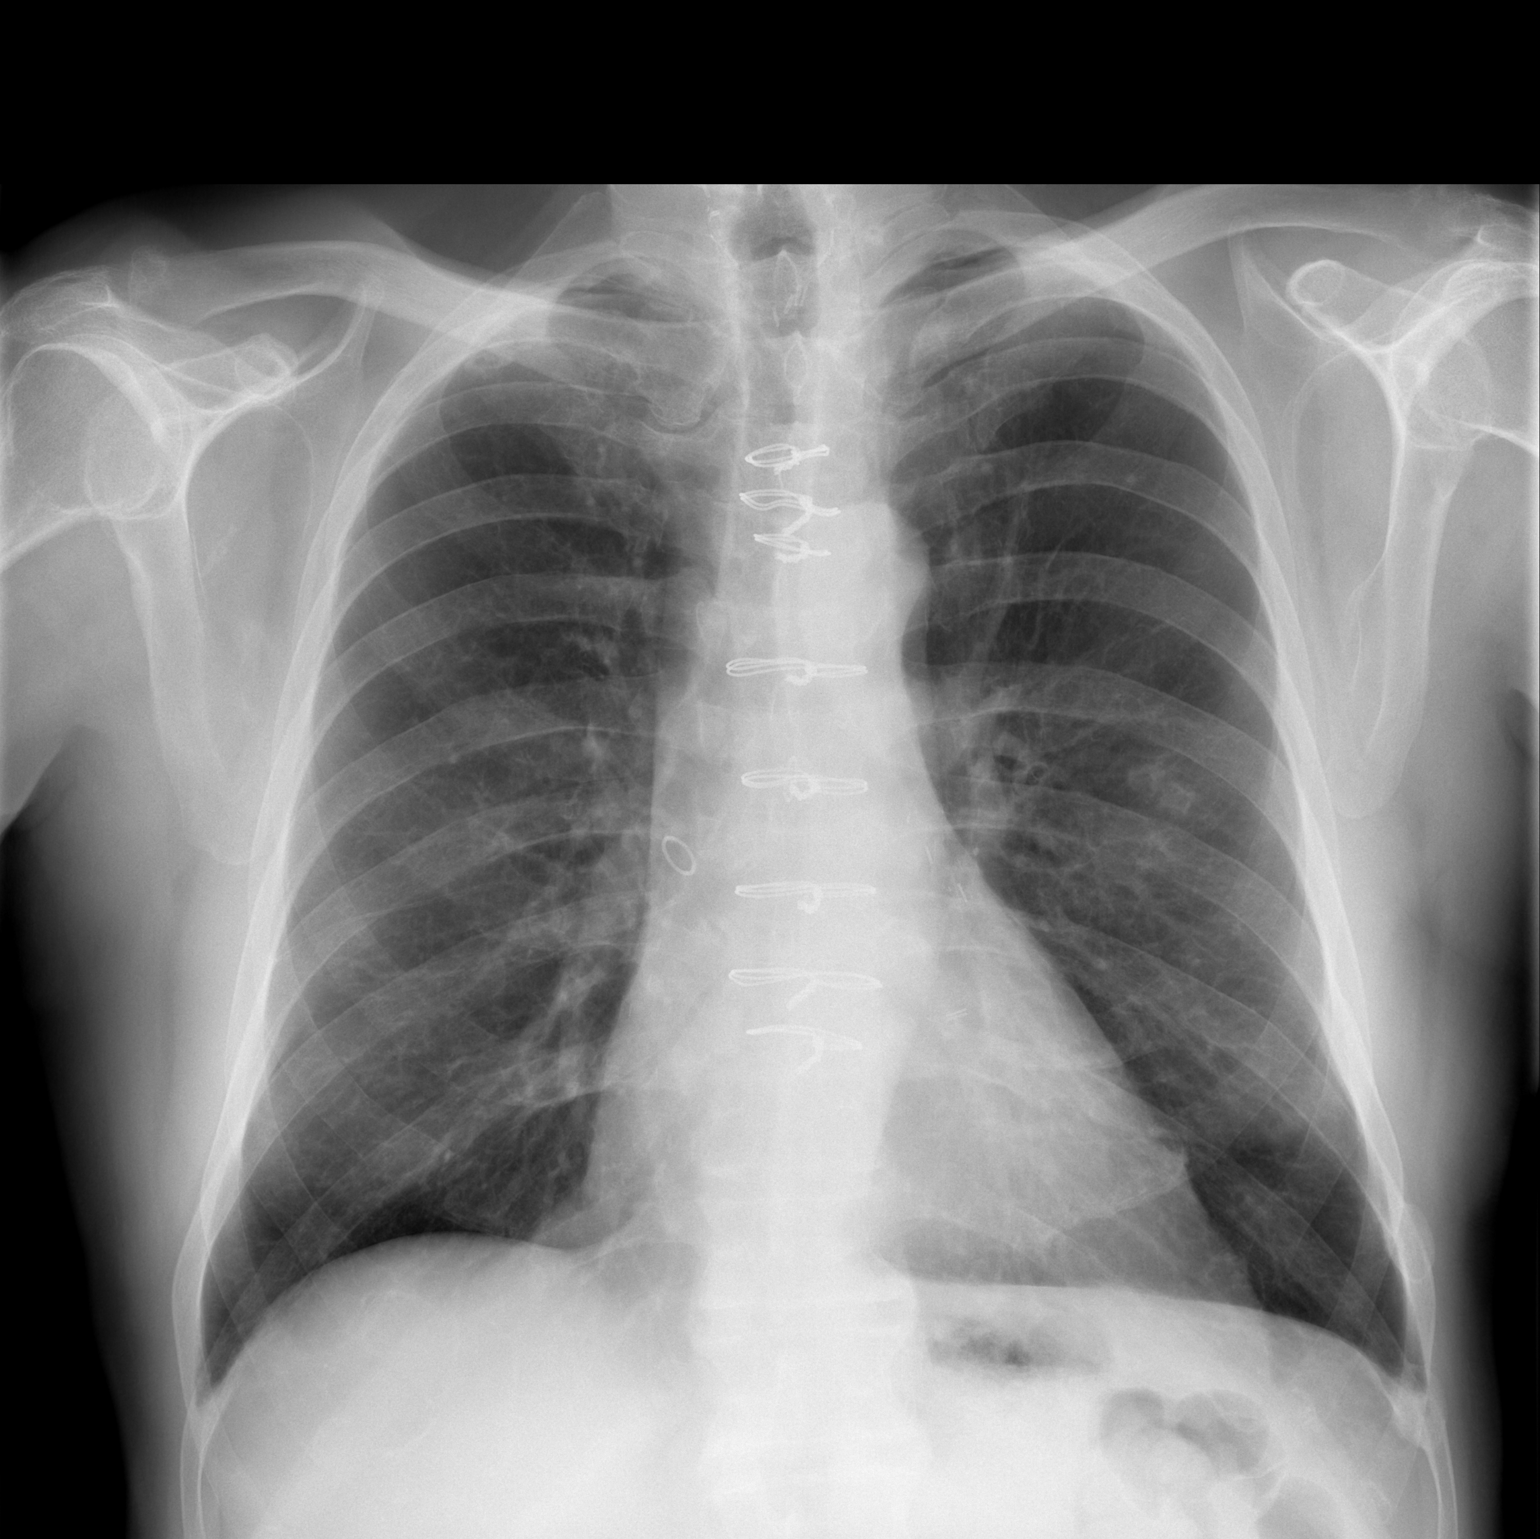

[w chest lat]
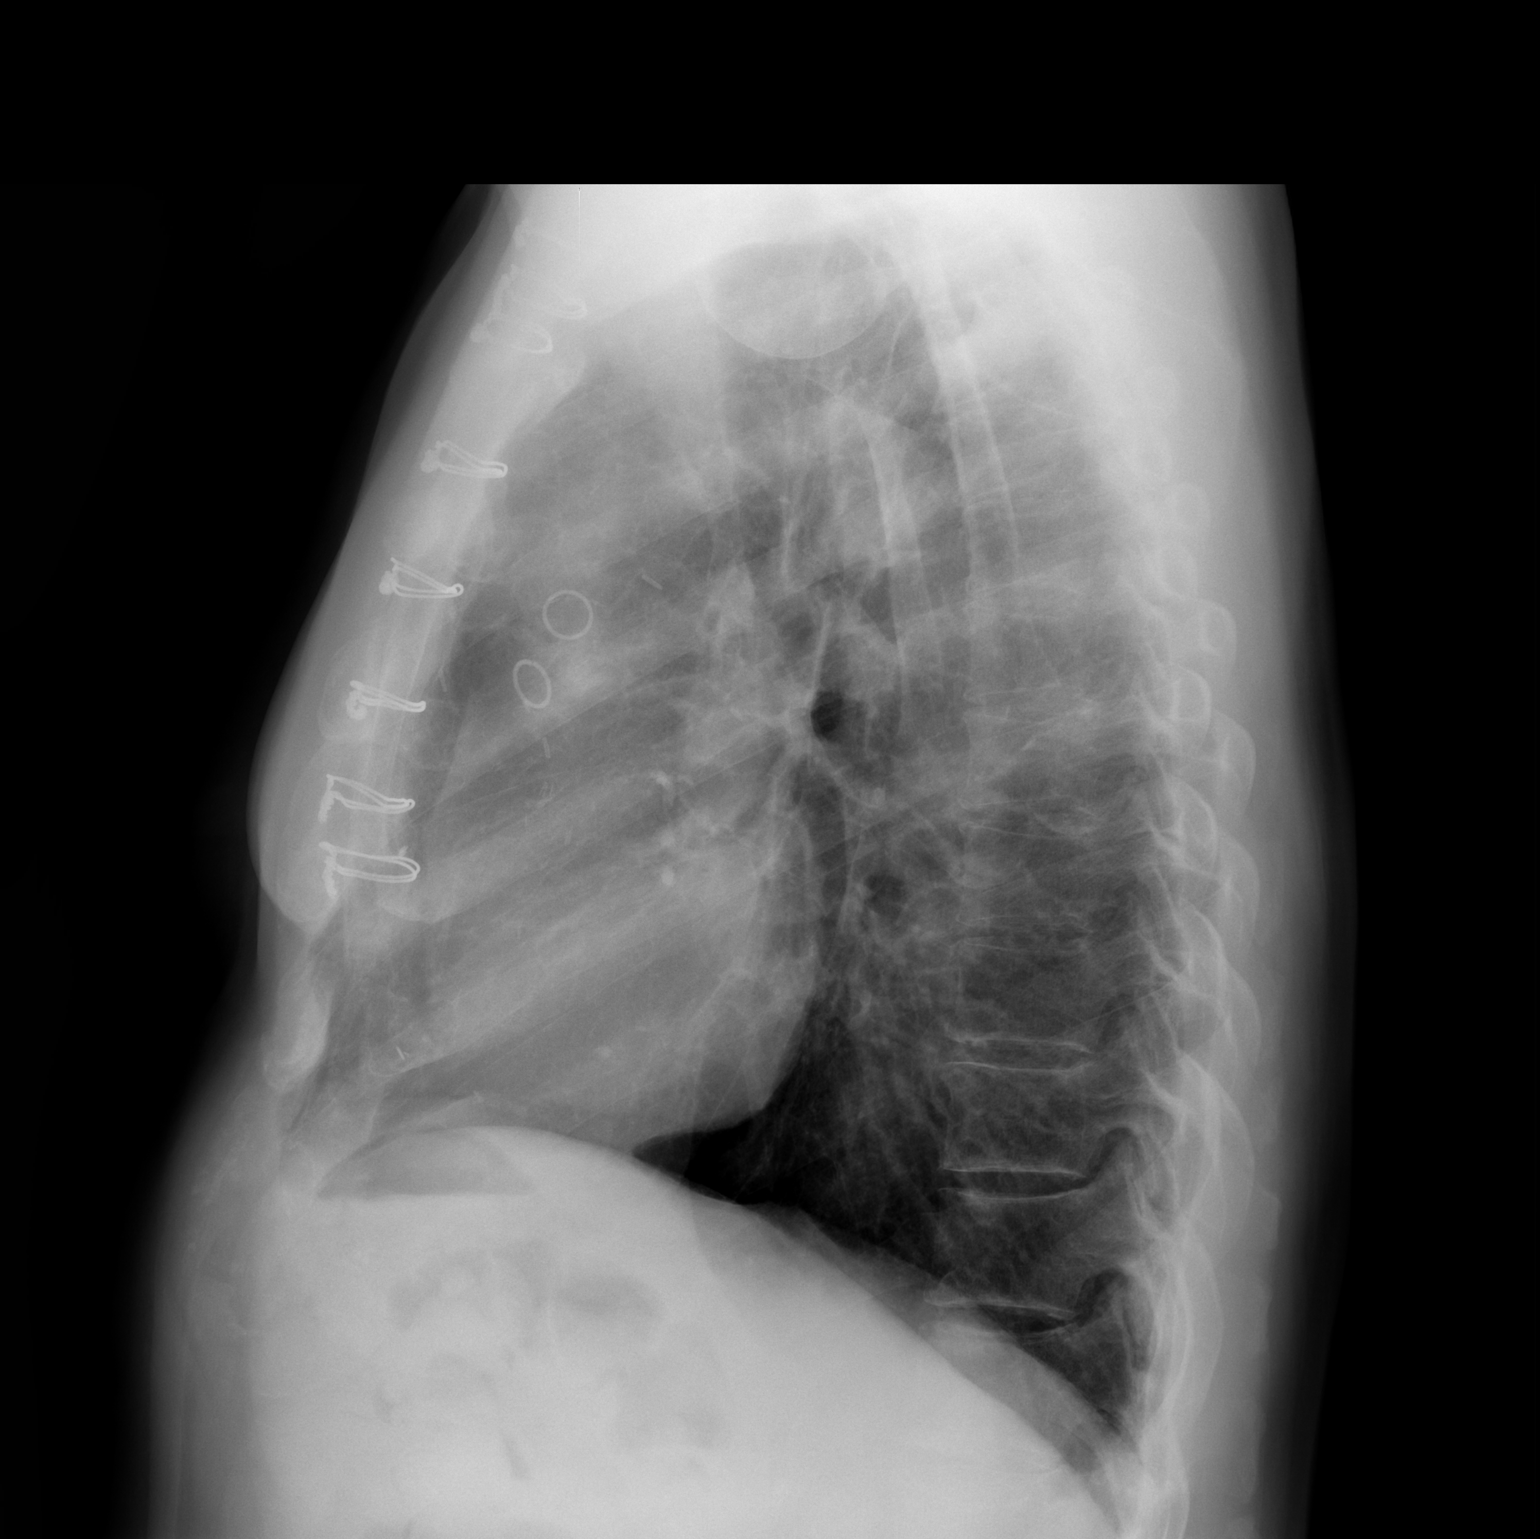

[2 of 2 positions shown; findings below may reference images not displayed]

FINDINGS: Changes from the prior CABG surgery are stable.  The
cardiac silhouette is normal in size and configuration.  No
mediastinal or hilar masses or adenopathy.  There is a focal area
of scarring in the left mid lung.  The lungs are otherwise clear.
No pleural effusion or pneumothorax.  Healed left lateral ninth and
tenth fractures are noted.  The bony thorax is demineralized but
otherwise intact.
IMPRESSION: No acute cardiopulmonary disease.

## 2012-06-10 NOTE — Progress Notes (Signed)
[address]            [name] [address]          [phone]     CARDIOTHORACIC SURGERY OFFICE NOTE  Referring Provider is Lennette Bihari, MD PCP is Dwana Melena, MD   HPI:  Patient returns for followup status post coronary artery bypass grafting x3 with concomitant right carotid endarterectomy on 02/29/2012. He was last seen here in the office on 03/25/2012. Since then he has done well. He is physically active and exercising regularly and he reports no shortness of breath or exertional chest pain. He still has some soreness across his anterior chest as well as sensitivity along the left anterior chest wall which is typical and expected following left internal mammary artery harvested in grafting. His activity level is quite good and he has no other complaints.   Current Outpatient Prescriptions  Medication Sig Dispense Refill  . aspirin 325 MG tablet Take 325 mg by mouth daily.      Marland Kitchen atorvastatin (LIPITOR) 20 MG tablet Take 1 tablet (20 mg total) by mouth daily at 6 PM.  30 tablet  1  . calcium carbonate (OS-CAL) 600 MG TABS Take 600 mg by mouth daily.      . cholecalciferol (VITAMIN D) 1000 UNITS tablet Take 1,000 Units by mouth daily.      . Coenzyme Q10 (CO Q-10) 100 MG CAPS Take 100 mg by mouth daily.       . folic acid (FOLVITE) 400 MCG tablet Take 400 mcg by mouth daily.      . metoprolol (LOPRESSOR) 50 MG tablet Take 1 tablet (50 mg total) by mouth 2 (two) times daily.  60 tablet  1  . Misc Natural Products (OSTEO BI-FLEX ADV DOUBLE ST) TABS Take 1 tablet by mouth 2 (two) times daily.       . Multiple Vitamin (MULTIVITAMIN WITH MINERALS) TABS Take 1 tablet by mouth daily.      . niacin 500 MG tablet Take 500 mg by mouth daily with breakfast.      . Omega-3 Fatty Acids (FISH OIL) 1000 MG CPDR Take 1,000 mg by mouth 2 (two) times daily.       . Probiotic Product (PROBIOTIC DAILY PO) Take by mouth 1 day or 1 dose.      . Psyllium (METAMUCIL PO)  Take 3 capsules by mouth daily.       Marland Kitchen SYNTHROID 125 MCG tablet Take 1 tablet (125 mcg total) by mouth daily.  30 tablet  6      Physical Exam:   BP 141/85  Pulse 57  Resp 16  Ht 6\' 1"  (1.854 m)  Wt 194 lb 12.8 oz (88.361 kg)  BMI 25.70 kg/m2  SpO2 97%  General:  Well-appearing  Chest:   Clear to auscultation  CV:   Regular rate and rhythm  Incisions:  Completely healed. Sternum is stable  Abdomen:  Soft and nontender  Extremities:  Warm and well-perfused  Diagnostic Tests:  *RADIOLOGY REPORT*  Clinical Data: Post CABG in July 2013. Soreness and numbness in  the upper left chest.  CHEST - 2 VIEW  Comparison: 03/25/2012  Findings: Changes from the prior CABG surgery are stable. The  cardiac silhouette is normal in size and configuration. No  mediastinal or hilar masses or adenopathy. There is a focal area  of scarring in the  left mid lung. The lungs are otherwise clear.  No pleural effusion or pneumothorax. Healed left lateral ninth and  tenth fractures are noted. The bony thorax is demineralized but  otherwise intact.  IMPRESSION:  No acute cardiopulmonary disease.  Original Report Authenticated By: Domenic Moras, M.D.    Impression:  Patient is doing well more than 3 months following coronary artery bypass grafting with concomitant right carotid endarterectomy. He still has some mild soreness and hypersensitivity along his anterior chest which is typical following left internal mammary artery harvested in grafting. He is healing nicely and the sternum is stable.  Plan:  I've advised the patient that he may continue to increase his activity as tolerated without any particular limitations. All of his questions been addressed. In the future he will call and return to see [place] only as needed.   Salvatore Decent. Cornelius Moras, MD 06/10/2012 4:55 PM

## 2012-06-10 NOTE — Patient Instructions (Signed)
Patient may return to unrestricted physical activity.

## 2012-06-12 ENCOUNTER — Encounter (HOSPITAL_COMMUNITY)
Admission: RE | Admit: 2012-06-12 | Discharge: 2012-06-12 | Disposition: A | Discharge status: Home / Self Care | Payer: Medicare Other | Source: Ambulatory Visit | Attending: Cardiovascular Disease | Admitting: Cardiovascular Disease

## 2012-06-14 ENCOUNTER — Encounter (HOSPITAL_COMMUNITY)
Admission: RE | Admit: 2012-06-14 | Discharge: 2012-06-14 | Disposition: A | Discharge status: Home / Self Care | Payer: Medicare Other | Source: Ambulatory Visit | Attending: Cardiovascular Disease | Admitting: Cardiovascular Disease

## 2012-06-14 DIAGNOSIS — I2510 Atherosclerotic heart disease of native coronary artery without angina pectoris: Secondary | ICD-10-CM | POA: Insufficient documentation

## 2012-06-14 DIAGNOSIS — Z5189 Encounter for other specified aftercare: Secondary | ICD-10-CM | POA: Insufficient documentation

## 2012-06-14 DIAGNOSIS — Z951 Presence of aortocoronary bypass graft: Secondary | ICD-10-CM | POA: Insufficient documentation

## 2012-06-17 ENCOUNTER — Encounter (HOSPITAL_COMMUNITY)
Admission: RE | Admit: 2012-06-17 | Discharge: 2012-06-17 | Disposition: A | Discharge status: Home / Self Care | Payer: Medicare Other | Source: Ambulatory Visit | Attending: Cardiovascular Disease | Admitting: Cardiovascular Disease

## 2012-06-19 ENCOUNTER — Encounter (HOSPITAL_COMMUNITY)
Admission: RE | Admit: 2012-06-19 | Discharge: 2012-06-19 | Disposition: A | Discharge status: Home / Self Care | Payer: Medicare Other | Source: Ambulatory Visit | Attending: Cardiovascular Disease | Admitting: Cardiovascular Disease

## 2012-06-21 ENCOUNTER — Encounter (HOSPITAL_COMMUNITY)
Admission: RE | Admit: 2012-06-21 | Discharge: 2012-06-21 | Disposition: A | Discharge status: Home / Self Care | Payer: Medicare Other | Source: Ambulatory Visit | Attending: Cardiovascular Disease | Admitting: Cardiovascular Disease

## 2012-06-24 ENCOUNTER — Encounter (HOSPITAL_COMMUNITY)
Admission: RE | Admit: 2012-06-24 | Discharge: 2012-06-24 | Disposition: A | Discharge status: Home / Self Care | Payer: Medicare Other | Source: Ambulatory Visit | Attending: Cardiovascular Disease | Admitting: Cardiovascular Disease

## 2012-06-26 ENCOUNTER — Encounter (HOSPITAL_COMMUNITY)
Admission: RE | Admit: 2012-06-26 | Discharge: 2012-06-26 | Disposition: A | Discharge status: Home / Self Care | Payer: Medicare Other | Source: Ambulatory Visit | Attending: Cardiovascular Disease | Admitting: Cardiovascular Disease

## 2012-06-28 ENCOUNTER — Encounter (HOSPITAL_COMMUNITY)
Admission: RE | Admit: 2012-06-28 | Discharge: 2012-06-28 | Disposition: A | Discharge status: Home / Self Care | Payer: Medicare Other | Source: Ambulatory Visit | Attending: Cardiovascular Disease | Admitting: Cardiovascular Disease

## 2012-07-01 ENCOUNTER — Encounter (HOSPITAL_COMMUNITY)
Admission: RE | Admit: 2012-07-01 | Discharge: 2012-07-01 | Disposition: A | Discharge status: Home / Self Care | Payer: Medicare Other | Source: Ambulatory Visit | Attending: Cardiovascular Disease | Admitting: Cardiovascular Disease

## 2012-07-03 ENCOUNTER — Encounter (HOSPITAL_COMMUNITY)
Admission: RE | Admit: 2012-07-03 | Discharge: 2012-07-03 | Disposition: A | Discharge status: Home / Self Care | Payer: Medicare Other | Source: Ambulatory Visit | Attending: Cardiovascular Disease | Admitting: Cardiovascular Disease

## 2012-07-05 ENCOUNTER — Encounter (HOSPITAL_COMMUNITY)
Admission: RE | Admit: 2012-07-05 | Discharge: 2012-07-05 | Disposition: A | Discharge status: Home / Self Care | Payer: Medicare Other | Source: Ambulatory Visit | Attending: Cardiovascular Disease | Admitting: Cardiovascular Disease

## 2012-07-08 ENCOUNTER — Encounter (HOSPITAL_COMMUNITY)
Admission: RE | Admit: 2012-07-08 | Discharge: 2012-07-08 | Disposition: A | Discharge status: Home / Self Care | Payer: Medicare Other | Source: Ambulatory Visit | Attending: Cardiovascular Disease | Admitting: Cardiovascular Disease

## 2012-07-17 ENCOUNTER — Encounter (HOSPITAL_COMMUNITY)
Admission: RE | Admit: 2012-07-17 | Discharge: 2012-07-17 | Disposition: A | Discharge status: Home / Self Care | Payer: Medicare Other | Source: Ambulatory Visit | Attending: Cardiovascular Disease | Admitting: Cardiovascular Disease

## 2012-07-17 DIAGNOSIS — Z5189 Encounter for other specified aftercare: Secondary | ICD-10-CM | POA: Insufficient documentation

## 2012-07-17 DIAGNOSIS — Z951 Presence of aortocoronary bypass graft: Secondary | ICD-10-CM | POA: Insufficient documentation

## 2012-07-17 DIAGNOSIS — I2510 Atherosclerotic heart disease of native coronary artery without angina pectoris: Secondary | ICD-10-CM | POA: Insufficient documentation

## 2012-07-19 ENCOUNTER — Encounter (HOSPITAL_COMMUNITY)
Admission: RE | Admit: 2012-07-19 | Discharge: 2012-07-19 | Disposition: A | Discharge status: Home / Self Care | Payer: Medicare Other | Source: Ambulatory Visit | Attending: Cardiovascular Disease | Admitting: Cardiovascular Disease

## 2012-07-22 ENCOUNTER — Encounter (HOSPITAL_COMMUNITY)
Admission: RE | Admit: 2012-07-22 | Discharge: 2012-07-22 | Disposition: A | Discharge status: Home / Self Care | Payer: Medicare Other | Source: Ambulatory Visit | Attending: Cardiovascular Disease | Admitting: Cardiovascular Disease

## 2012-07-24 ENCOUNTER — Encounter (HOSPITAL_COMMUNITY)
Admission: RE | Admit: 2012-07-24 | Discharge: 2012-07-24 | Disposition: A | Discharge status: Home / Self Care | Payer: Medicare Other | Source: Ambulatory Visit | Attending: Cardiovascular Disease | Admitting: Cardiovascular Disease

## 2012-07-26 ENCOUNTER — Encounter (HOSPITAL_COMMUNITY)
Admission: RE | Admit: 2012-07-26 | Discharge: 2012-07-26 | Disposition: A | Discharge status: Home / Self Care | Payer: Medicare Other | Source: Ambulatory Visit | Attending: Cardiovascular Disease | Admitting: Cardiovascular Disease

## 2012-07-29 ENCOUNTER — Encounter (HOSPITAL_COMMUNITY)
Admission: RE | Admit: 2012-07-29 | Discharge: 2012-07-29 | Disposition: A | Discharge status: Home / Self Care | Payer: Medicare Other | Source: Ambulatory Visit | Attending: Cardiovascular Disease | Admitting: Cardiovascular Disease

## 2012-07-31 ENCOUNTER — Encounter (HOSPITAL_COMMUNITY)
Admission: RE | Admit: 2012-07-31 | Discharge: 2012-07-31 | Disposition: A | Discharge status: Home / Self Care | Payer: Medicare Other | Source: Ambulatory Visit | Attending: Cardiovascular Disease | Admitting: Cardiovascular Disease

## 2012-08-02 ENCOUNTER — Encounter (HOSPITAL_COMMUNITY): Payer: Medicare Other

## 2012-08-09 NOTE — Progress Notes (Signed)
Cardiac Rehabilitation Program Outcomes Report   Orientation:  04/11/2012 1st week :04/22/2012 Graduate Date:  tbd Discharge Date:  tbd # of sessions completed: 3 DX: CAD and CABG X 3  Cardiologist: Cooper Render MD:  Dr. Catalina Pizza Class Time:  11:00  A.  Exercise Program:  Tolerates exercise @ 3.60 METS for 15 minutes and Walk Test Results:  Pre: Resting BP 132/90, HR 57, O2/97% RPE 6, RPD 6, at 6 min,  HR 80, 162/80, O2 98%, RPE 7, RPD 7, Walked 1600 ft at 3.0 mph.  B.  Mental Health:  Good mental attitude  C.  Education/Instruction/Skills  Knows THR for exercise and Uses Perceived Exertion Scale and/or Dyspnea Scale  Uses Perceived Exertion Scale and/or Dyspnea Scale  D.  Nutrition/Weight Control/Body Composition:  Adherence to prescribed nutrition program: good    E.  Blood Lipids    No results found for this basename: CHOL, HDL, LDLCALC, LDLDIRECT, TRIG, CHOLHDL    F.  Lifestyle Changes:  Making positive lifestyle changes  G.  Symptoms noted with exercise:  Asymptomatic  Report Completed By:  Lelon Huh. Jonaven Hilgers RN   Comments:  This is patients 1st week report. He achieved a peak METS of 3.60. His resting HR is 59 and resting BP of 138/64. His peak HR is 81 and his peak BP was 140/66. A halfway report will follow at visit # 18.

## 2012-08-09 NOTE — Progress Notes (Signed)
Cardiac Rehabilitation Program Outcomes Report   Orientation:  04/11/2012 !8th Visit report: 06/12/2012 Graduate Date:  tbd Discharge Date:  tbd # of sessions completed: 18 DX: CAD and CABG X 4  Cardiologist: Cooper Render MD:  Dr. Catalina Pizza Class Time:  11:00  A.  Exercise Program:  Tolerates exercise @ 4.03 METS for 15 minutes  B.  Mental Health:  Good mental attitude  C.  Education/Instruction/Skills  Knows THR for exercise and Uses Perceived Exertion Scale and/or Dyspnea Scale  Uses Perceived Exertion Scale and/or Dyspnea Scale  D.  Nutrition/Weight Control/Body Composition:  Adherence to prescribed nutrition program: good    E.  Blood Lipids    No results found for this basename: CHOL, HDL, LDLCALC, LDLDIRECT, TRIG, CHOLHDL    F.  Lifestyle Changes:  Making positive lifestyle changes  G.  Symptoms noted with exercise:  Asymptomatic  Report Completed By:  Lelon Huh. Shiann Kam RN   Comments:  This is patients halfway report  He achieved a peak METS of 4.03. His resting HR was 61 and resting BP was 140/80, His peak HR was 75 and his peak BP was 140/70. A graduation report will follow on his 36th visit.

## 2012-08-20 ENCOUNTER — Encounter: Payer: Self-pay | Admitting: Neurosurgery

## 2012-08-20 ENCOUNTER — Encounter: Payer: Self-pay | Admitting: Vascular Surgery

## 2012-08-21 ENCOUNTER — Other Ambulatory Visit: Payer: Self-pay | Admitting: *Deleted

## 2012-08-21 ENCOUNTER — Encounter: Payer: Self-pay | Admitting: Neurosurgery

## 2012-08-21 ENCOUNTER — Other Ambulatory Visit (INDEPENDENT_AMBULATORY_CARE_PROVIDER_SITE_OTHER): Payer: Medicare Other | Admitting: *Deleted

## 2012-08-21 ENCOUNTER — Ambulatory Visit (INDEPENDENT_AMBULATORY_CARE_PROVIDER_SITE_OTHER): Payer: Medicare Other | Admitting: Neurosurgery

## 2012-08-21 VITALS — BP 180/104 | HR 53 | Resp 20 | Ht 73.0 in | Wt 198.0 lb

## 2012-08-21 DIAGNOSIS — I6529 Occlusion and stenosis of unspecified carotid artery: Secondary | ICD-10-CM

## 2012-08-21 NOTE — Progress Notes (Signed)
[facility] [place] Carotid Office Note  CC: Carotid surveillance Referring Physician: Edilia Bo  History of Present Illness: 72 year old male patient of Dr. Edilia Bo status post right CEA in July of 2013. The patient denies signs or symptoms of CVA, TIA, amaurosis fugax or any neural deficit. The patient's blood pressure today is highly elevated at 180/108 and he was strongly encouraged to see his primary care doctor today.  Past Medical History  Diagnosis Date  . Hypertension   . Hyperlipidemia   . CAD (coronary artery disease)   . Wears glasses     or contacts  . Deviated septum   . Thyroid nodule   . GERD (gastroesophageal reflux disease)   . Heart attack   . MI (myocardial infarction) 1997  . 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 CAD (coronary artery disease) 12/04/2011    Left Main Disease with 3- vessel CAD   . PONV (postoperative nausea and vomiting)     has in past, no problems after most recent surgery  . Hypothyroidism   . Shortness of breath     with exertion  . Sleep apnea     CPAP, sleep study at [facility]  . 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 S/P carotid endarterectomy 02/29/2012    Right CEA for asymptomatic severe right ICA stenosis     ROS: [x]  Positive   [ ]  Denies    General: [ ]  Weight loss, [ ]  Fever, [ ]  chills Neurologic: [ ]  Dizziness, [ ]  Blackouts, [ ]  Seizure [ ]  Stroke, [ ]  "Mini stroke", [ ]  Slurred speech, [ ]  Temporary blindness; [ ]  weakness in arms or legs, [ ]  Hoarseness Cardiac: [ ]  Chest pain/pressure, [ ]  Shortness of breath at rest [ ]  Shortness of breath with exertion, [ ]  Atrial fibrillation or irregular heartbeat Vascular: [ ]  Pain in legs with walking, [ ]  Pain in legs at rest, [ ]  Pain in legs at night,  [ ]  Non-healing ulcer, [ ]  Blood clot in vein/DVT,   Pulmonary: [ ]  Home oxygen, [ ]  Productive cough, [ ]  Coughing  up blood, [ ]  Asthma,  [ ]  Wheezing Musculoskeletal:  [ ]  Arthritis, [ ]  Low back pain, [ ]  Joint pain Hematologic: [ ]  Easy Bruising, [ ]  Anemia; [ ]  Hepatitis Gastrointestinal: [ ]  Blood in stool, [ ]  Gastroesophageal Reflux/heartburn, [ ]  Trouble swallowing Urinary: [ ]  chronic Kidney disease, [ ]  on HD - [ ]  MWF or [ ]  TTHS, [ ]  Burning with urination, [ ]  Difficulty urinating Skin: [ ]  Rashes, [ ]  Wounds Psychological: [ ]  Anxiety, [ ]  Depression   Social History History  Substance Use Topics  . Smoking status: Former Smoker    Types: Cigarettes    Quit date: 09/04/2001  . Smokeless tobacco: Never Used     Comment: smoked 31yrs, smoked 1/2 pack a day  . Alcohol Use: Yes     Comment: social- beer , not on regular basis    Family History Family History  Problem Relation Age of Onset  . Hypertension Mother   . Healthy Sister   . Healthy Brother   . Healthy Son   . Colon cancer Neg Hx     No Known Allergies  Current Outpatient Prescriptions  Medication Sig Dispense Refill  . aspirin 325 MG tablet Take  325 mg by mouth daily.      Marland Kitchen atorvastatin (LIPITOR) 20 MG tablet Take 1 tablet (20 mg total) by mouth daily at 6 PM.  30 tablet  1  . calcium carbonate (OS-CAL) 600 MG TABS Take 600 mg by mouth daily.      . cholecalciferol (VITAMIN D) 1000 UNITS tablet Take 1,000 Units by mouth daily.      . Coenzyme Q10 (CO Q-10) 100 MG CAPS Take 100 mg by mouth daily.       . folic acid (FOLVITE) 400 MCG tablet Take 400 mcg by mouth daily.      . metoprolol (LOPRESSOR) 50 MG tablet Take 1 tablet (50 mg total) by mouth 2 (two) times daily.  60 tablet  1  . Misc Natural Products (OSTEO BI-FLEX ADV DOUBLE ST) TABS Take 1 tablet by mouth 2 (two) times daily.       . Multiple Vitamin (MULTIVITAMIN WITH MINERALS) TABS Take 1 tablet by mouth daily.      . niacin 500 MG tablet Take 500 mg by mouth daily with breakfast.      . Omega-3 Fatty Acids (FISH OIL) 1000 MG CPDR Take 1,000 mg by mouth 2  (two) times daily.       . Probiotic Product (PROBIOTIC DAILY PO) Take by mouth 1 day or 1 dose.      . Psyllium (METAMUCIL PO) Take 3 capsules by mouth daily.       Marland Kitchen SYNTHROID 125 MCG tablet Take 1 tablet (125 mcg total) by mouth daily.  30 tablet  6    Physical Examination  Filed Vitals:   08/21/12 1203  BP: 180/104  Pulse: 53  Resp:     Body mass index is 26.12 kg/(m^2).  General:  WDWN in NAD Gait: Normal HEENT: WNL Eyes: Pupils equal Pulmonary: normal non-labored breathing , without Rales, rhonchi,  wheezing Cardiac: RRR, without  Murmurs, rubs or gallops; Abdomen: soft, NT, no masses Skin: no rashes, ulcers noted  Vascular Exam Pulses: 3+ radial pulses bilaterally Carotid bruits: Carotid pulses to auscultation no bruits are heard Extremities without ischemic changes, no Gangrene , no cellulitis; no open wounds;  Musculoskeletal: no muscle wasting or atrophy   Neurologic: A&O X 3; Appropriate Affect ; SENSATION: normal; MOTOR FUNCTION:  moving all extremities equally. Speech is fluent/normal  Non-Invasive Vascular Imaging CAROTID DUPLEX 08/21/2012  Right ICA 0 - 19% stenosis Left ICA 20 - 39 % stenosis   ASSESSMENT/PLAN: Asymptomatic patient that will followup in 6 months with repeat carotid duplex. The patient's questions were encouraged and answered, he is in agreement with this plan.  Lauree Chandler ANP   Clinic MD: Edilia Bo

## 2012-08-28 ENCOUNTER — Ambulatory Visit: Payer: Medicare Other | Admitting: Neurosurgery

## 2012-08-28 ENCOUNTER — Other Ambulatory Visit: Payer: Medicare Other

## 2012-09-09 ENCOUNTER — Other Ambulatory Visit (HOSPITAL_COMMUNITY): Payer: Self-pay | Admitting: Cardiovascular Disease

## 2012-09-09 DIAGNOSIS — I10 Essential (primary) hypertension: Secondary | ICD-10-CM

## 2012-09-18 ENCOUNTER — Ambulatory Visit (HOSPITAL_COMMUNITY)
Admission: RE | Admit: 2012-09-18 | Discharge: 2012-09-18 | Disposition: A | Discharge status: Home / Self Care | Payer: Medicare Other | Source: Ambulatory Visit | Attending: Cardiovascular Disease | Admitting: Cardiovascular Disease

## 2012-09-18 DIAGNOSIS — I10 Essential (primary) hypertension: Secondary | ICD-10-CM | POA: Insufficient documentation

## 2012-09-18 NOTE — Progress Notes (Signed)
Renal Duplex Doppler Completed. Sharmayne Jablon D  

## 2013-01-27 ENCOUNTER — Telehealth: Payer: Self-pay | Admitting: Cardiovascular Disease

## 2013-01-27 NOTE — Telephone Encounter (Signed)
[name] [name] said he was told by [facility] that [facility] needs to faxed the pressure setting to change his machine to them. Please fax to [phone] Attention Merla.   Thanks

## 2013-01-27 NOTE — Telephone Encounter (Signed)
Message forwarded to W. Waddell, CMA.  This is a [place] patient and las OV note given.

## 2013-01-31 NOTE — Telephone Encounter (Signed)
Mr. Rode is calling about his pressure setting on his cpap machine .Marland Kitchen Please fax the information to [phone]... Please Call

## 2013-01-31 NOTE — Telephone Encounter (Signed)
Waiting on call from [facility] with recent sleep study information.

## 2013-02-05 ENCOUNTER — Telehealth: Payer: Self-pay | Admitting: Cardiovascular Disease

## 2013-02-05 NOTE — Telephone Encounter (Signed)
Mrs. [name] [name] is calling about a sleep study that [name] [name] had back in February . [name] has tried to fax over the paper to [facility] patient for his sleep apnea machine four different times to each number that they have given and they haven't received the faxed . So she is asking if [name] will mail the report from the sleep study to [facility] [address] [name] [address].  Thanks

## 2013-02-06 ENCOUNTER — Encounter: Payer: Self-pay | Admitting: *Deleted

## 2013-02-06 ENCOUNTER — Telehealth: Payer: Self-pay | Admitting: Cardiovascular Disease

## 2013-02-06 NOTE — Telephone Encounter (Signed)
Called [facility] again to request copy of sleep study information to give to Dr. Tresa Endo for orders. Had to leave message on their answering machine.

## 2013-02-06 NOTE — Telephone Encounter (Signed)
Did we order a pressure for patient's CPAP machine?   If so, need to fax an order to [facility] [phone]

## 2013-02-06 NOTE — Telephone Encounter (Signed)
Wants to know if you have mailed his report to [facility]? Please call her!

## 2013-02-07 ENCOUNTER — Telehealth: Payer: Self-pay | Admitting: Cardiovascular Disease

## 2013-02-07 NOTE — Telephone Encounter (Signed)
Call to [facility] in [place] office and informed there are no orders in chart. Stated last sleep study is in chart and was faxed to [facility] on 4.21.14 and 4.29.14 by Harriett Sine.  Stated she will fax that to [place] office now.    Returned call and spoke w/ Merla.  Informed as stated above and last OV note(4.16.14) states Dr. Alanda Amass was going to call the sleep center to help pt w/ his CPAP machine issues.  Also informed Merla that RN will leave a message for Dr. Alanda Amass to review upon his return.  Verbalized understanding and will call pt to inform him.  Message forwarded to [facility]. Berlinda Last, LPN to discuss w/ Dr. Alanda Amass.  This note and last OV note placed on Dr. Kandis Cocking cart in a red sleeve.

## 2013-02-07 NOTE — Telephone Encounter (Signed)
[name] [name] is calling to find out if there was ever a order put in place to have [name] [name] pressure change on his cpac machine , because he has called several times and is stating that he was told that the order was faxed to them, but she has not received any others.She is asking that someone please give her a call back to today.    Thanks

## 2013-02-12 ENCOUNTER — Telehealth: Payer: Self-pay | Admitting: *Deleted

## 2013-02-12 NOTE — Telephone Encounter (Signed)
Sent a fax to [employer]  Attention : Merla  Requesting a download on patient.

## 2013-02-19 ENCOUNTER — Other Ambulatory Visit: Payer: Medicare Other

## 2013-02-19 ENCOUNTER — Ambulatory Visit: Payer: Medicare Other | Admitting: Neurosurgery

## 2013-02-20 NOTE — Telephone Encounter (Signed)
i talked to [facility] about mr. Searfoss and she stated wanda had requested a download on mr. Waln and dr. Tresa Endo was going to review and advise.

## 2013-03-04 ENCOUNTER — Encounter: Payer: Self-pay | Admitting: Vascular Surgery

## 2013-03-05 ENCOUNTER — Encounter: Payer: Self-pay | Admitting: Vascular Surgery

## 2013-03-05 ENCOUNTER — Other Ambulatory Visit (INDEPENDENT_AMBULATORY_CARE_PROVIDER_SITE_OTHER): Payer: Medicare Other | Admitting: *Deleted

## 2013-03-05 ENCOUNTER — Ambulatory Visit (INDEPENDENT_AMBULATORY_CARE_PROVIDER_SITE_OTHER): Payer: Medicare Other | Admitting: Vascular Surgery

## 2013-03-05 DIAGNOSIS — I6529 Occlusion and stenosis of unspecified carotid artery: Secondary | ICD-10-CM

## 2013-03-05 NOTE — Progress Notes (Signed)
[facility]  Patient name: [name] [name] MRN: [id number] DOB: 1940/10/13 Sex: male  REASON FOR VISIT: follow up after right carotid endarterectomy.  HPI: [name] [name] is a 72 y.o. male was found have a greater than 80% right carotid stenosis. Preoperative cardiac evaluation showed evidence of significant coronary disease. He underwent combined right carotid endarterectomy and coronary revascularization on 02/29/2012. He comes in for a yearly follow up visit. He complains of some paresthesias in his tongue which she has had for a year. He said no focal weakness otherwise in no history of TIAs, stroke, expressive or receptive aphasia, or amaurosis fugax.  VASCULAR QUALITY INITIATIVE FOLLOW UP DATA:  Current smoker: [  ] yes  Arly.Keller  ] no  Living status: [ X ]  Home  [  ] Nursing home  [  ] Homeless    MEDS:  ASA Arly.Keller  ] yes  [  ] no- [  ] medical reason  [  ] non compliant  STATIN  Arly.Keller  ] yes  [  ] no- [  ] medical reason  [  ] non compliant  Beta blocker [  ] yes  [  ] no- Arly.Keller  ] medical reason  [  ] non compliant  ACE inhibitor Arly.Keller  ] yes  [  ] no- [  ] medical reason  [  ] non compliant  P2Y12 Antagonist Arly.Keller  ] none  [  ] clopidogrel-Plavix  [  ] ticlopidine-Ticlid   [  ] prasugrel-Effient  [  ] ticagrelor- Brilinta    Anticoagulant [ X ] None  [  ] warfarin  [  ] rivaroxaban-Xarelto [  ] dabigatran- Pradaxa  Neurologic event since D/C:  Arly.Keller  ] no  [  ] yes: [  ] eye event  [  ] cortical event  [  ] VB event  [  ] non specific event  [  ] right  [  ] left  [  ] TIA  [  ] stroke  Date:   Modified Rankin Score: 0  MI since D/C: [ X ] no  [  ] troponin only  [  ] EKG or clinical  Cranial nerve injury: Arly.Keller  ] none  [  ] resolved  [  ] persistent  Duplex CEA site: [  ] no  Arly.Keller  ] yes - PSV= 93  EDV= 23 ICA/CCA ratio: 1.06  Stenosis= Arly.Keller  ] <40% [  ] 40-59% [  ] 60-79%  [  ] > 80%  [  ]  Occluded  CEA site re-operation:  [ X ] no   [  ] yes- date of re-op:   CEA site PCI:   Arly.Keller ] no   [  ] yes- date of PCI:   Past Medical History  Diagnosis Date  . Hypertension   . Hyperlipidemia   . CAD (coronary artery disease)   . Wears glasses     or contacts  . Deviated septum   . Thyroid nodule   . GERD (gastroesophageal reflux disease)   . Heart attack   . MI (myocardial infarction) 1997  . 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 CAD (coronary artery disease) 12/04/2011    Left Main Disease with 3- vessel CAD   . PONV (postoperative nausea and vomiting)     has in past, no problems after most recent  surgery  . Hypothyroidism   . Shortness of breath     with exertion  . Sleep apnea     CPAP, sleep study at [facility]  . 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 S/P carotid endarterectomy 02/29/2012    Right CEA for asymptomatic severe right ICA stenosis   . CHF (congestive heart failure)    Family History  Problem Relation Age of Onset  . Hypertension Mother   . Healthy Sister   . Healthy Brother   . Healthy Son   . Colon cancer Neg Hx   . Heart attack Father    SOCIAL HISTORY: History  Substance Use Topics  . Smoking status: Former Smoker    Types: Cigarettes    Quit date: 09/04/2001  . Smokeless tobacco: Never Used     Comment: smoked 91yrs, smoked 1/2 pack a day  . Alcohol Use: 1.8 - 2.4 oz/week    3-4 Cans of beer per week     Comment: social- beer , not on regular basis   No Known Allergies  Current Outpatient Prescriptions  Medication Sig Dispense Refill  . aspirin 325 MG tablet Take 325 mg by mouth daily.      . calcium carbonate (OS-CAL) 600 MG TABS Take 600 mg by mouth daily.      . cholecalciferol (VITAMIN D) 1000 UNITS tablet Take 1,000 Units by mouth daily.      . folic acid (FOLVITE) 400 MCG tablet Take 400 mcg by mouth daily.      . Misc Natural Products (OSTEO BI-FLEX ADV DOUBLE ST) TABS Take 1 tablet by mouth 2 (two) times  daily.       . Multiple Vitamin (MULTIVITAMIN WITH MINERALS) TABS Take 1 tablet by mouth daily.      . niacin 500 MG tablet Take 500 mg by mouth daily with breakfast.      . Omega-3 Fatty Acids (FISH OIL) 1000 MG CPDR Take 1,000 mg by mouth 2 (two) times daily.       . Probiotic Product (PROBIOTIC DAILY PO) Take by mouth 1 day or 1 dose.      . Psyllium (METAMUCIL PO) Take 3 capsules by mouth daily.       . ramipril (ALTACE) 10 MG capsule Take 1 capsule by mouth daily.      Marland Kitchen zolpidem (AMBIEN) 10 MG tablet Take 10 mg by mouth at bedtime as needed for sleep.      Marland Kitchen atorvastatin (LIPITOR) 20 MG tablet Take 1 tablet (20 mg total) by mouth daily at 6 PM.  30 tablet  1  . Coenzyme Q10 (CO Q-10) 100 MG CAPS Take 100 mg by mouth daily.       . metoprolol (LOPRESSOR) 50 MG tablet Take 1 tablet (50 mg total) by mouth 2 (two) times daily.  60 tablet  1  . SYNTHROID 125 MCG tablet Take 1 tablet (125 mcg total) by mouth daily.  30 tablet  6   No current facility-administered medications for this visit.   REVIEW OF SYSTEMS: Arly.Keller ] denotes positive finding; [  ] denotes negative finding  CARDIOVASCULAR:  Arly.Keller ] chest pain   [ ]  chest pressure   [ ]  palpitations   [ ]  orthopnea   [ ]  dyspnea on exertion   [ ]  claudication   [ ]  rest pain   [ ]  DVT   [ ]   phlebitis PULMONARY:   [ ]  productive cough   [ ]  asthma   [ ]  wheezing NEUROLOGIC:   [ ]  weakness  [ ]  paresthesias  [ ]  aphasia  [ ]  amaurosis  [ ]  dizziness HEMATOLOGIC:   [ ]  bleeding problems   [ ]  clotting disorders MUSCULOSKELETAL:  [ ]  joint pain   [ ]  joint swelling [ ]  leg swelling GASTROINTESTINAL: [ ]   blood in stool  [ ]   hematemesis GENITOURINARY:  [ ]   dysuria  [ ]   hematuria PSYCHIATRIC:  [ ]  history of major depression INTEGUMENTARY:  [ ]  rashes  [ ]  ulcers CONSTITUTIONAL:  [ ]  fever   [ ]  chills  PHYSICAL EXAM: Filed Vitals:   03/05/13 1057 03/05/13 1101  BP: 139/83 142/80  Pulse: 62   Height: 6\' 1"  (1.854 m)   Weight: 199 lb 6.4 oz  (90.447 kg)   SpO2: 97%    Body mass index is 26.31 kg/(m^2). GENERAL: The patient is a well-nourished male, in no acute distress. The vital signs are documented above. CARDIOVASCULAR: There is a regular rate and rhythm. Do not detect carotid bruits. PULMONARY: There is good air exchange bilaterally without wheezing or rales. ABDOMEN: Soft and non-tender with normal pitched bowel sounds.  MUSCULOSKELETAL: There are no major deformities or cyanosis. NEUROLOGIC: No focal weakness or paresthesias are detected. SKIN: There are no ulcers or rashes noted. PSYCHIATRIC: The patient has a normal affect.  DATA:  I have independently interpreted his carotid duplex scan which shows a widely patent right carotid endarterectomy site with no significant stenosis on the left.  MEDICAL ISSUES: Patient is doing well status post right carotid endarterectomy he has no evidence of recurrent stenosis on the right. I've ordered a fall carotid duplex scan in 1 year now see him back at that time. His only real complaint has been some paresthesias in his tongue which she's had for the last year. Given that he had a combined carotid endarterectomy and CABG sure he was intubated for some time although I never heard of this issue after carotid surgery or from the patient. The other possibility would be paresthesias in the tongue from a vitamin B deficiency I've asked him to follow up with his primary care physician concerning this. I will see him back in one year. He knows to call sooner if he has problems. Meantime he knows to continue taking his aspirin.  Sumeya Yontz S [facility] [place] [facility]: [phone]

## 2013-03-07 ENCOUNTER — Other Ambulatory Visit: Payer: Self-pay | Admitting: *Deleted

## 2013-03-07 DIAGNOSIS — Z48812 Encounter for surgical aftercare following surgery on the circulatory system: Secondary | ICD-10-CM

## 2013-03-18 ENCOUNTER — Other Ambulatory Visit: Payer: Self-pay | Admitting: *Deleted

## 2013-03-18 DIAGNOSIS — R079 Chest pain, unspecified: Secondary | ICD-10-CM

## 2013-03-19 ENCOUNTER — Other Ambulatory Visit: Payer: Self-pay | Admitting: Cardiovascular Disease

## 2013-03-19 LAB — LIPID PANEL
Total CHOL/HDL Ratio: 3 Ratio
VLDL: 22 mg/dL (ref 0–40)

## 2013-03-19 LAB — COMPREHENSIVE METABOLIC PANEL
ALT: 16 U/L (ref 0–53)
AST: 18 U/L (ref 0–37)
CO2: 29 mEq/L (ref 19–32)
Chloride: 103 mEq/L (ref 96–112)
Creat: 1.27 mg/dL (ref 0.50–1.35)
Sodium: 137 mEq/L (ref 135–145)
Total Bilirubin: 0.9 mg/dL (ref 0.3–1.2)
Total Protein: 6.8 g/dL (ref 6.0–8.3)

## 2013-03-19 LAB — TSH: TSH: 1.024 u[IU]/mL (ref 0.350–4.500)

## 2013-03-20 ENCOUNTER — Encounter: Payer: Self-pay | Admitting: Cardiovascular Disease

## 2013-03-26 ENCOUNTER — Ambulatory Visit (HOSPITAL_COMMUNITY)
Admission: RE | Admit: 2013-03-26 | Discharge: 2013-03-26 | Disposition: A | Discharge status: Home / Self Care | Payer: Medicare Other | Source: Ambulatory Visit | Attending: Cardiovascular Disease | Admitting: Cardiovascular Disease

## 2013-03-26 DIAGNOSIS — R079 Chest pain, unspecified: Secondary | ICD-10-CM

## 2013-03-26 DIAGNOSIS — E785 Hyperlipidemia, unspecified: Secondary | ICD-10-CM | POA: Insufficient documentation

## 2013-03-26 DIAGNOSIS — I10 Essential (primary) hypertension: Secondary | ICD-10-CM | POA: Insufficient documentation

## 2013-03-26 NOTE — Progress Notes (Signed)
*  PRELIMINARY RESULTS* Echocardiogram 2D Echocardiogram has been performed.  [name] [name] 03/26/2013, 10:12 AM

## 2013-05-27 NOTE — Addendum Note (Signed)
Encounter addended by: Angelica Pou, RN on: 05/27/2013 10:04 AM<BR>     Documentation filed: Clinical Notes

## 2013-05-27 NOTE — Progress Notes (Signed)
Cardiac Rehabilitation Program Outcomes Report   Orientation:  04/11/2012 Graduate Date:  07/31/2012 Discharge Date:  07/31/2012 # of sessions completed: 36 DX: CABG X 3  Cardiologist: Susa Griffins Family MD:  Dwana Melena Class Time:  11:00  A.  Exercise Program:  Tolerates exercise @ 4.07 METS for 15 minutes, Walk Test Results:  Post: Post Walk test Results: Resting HR was 56,BP 130.70, O2 98, RPE 7 and RPD 7. ^ min HR 79, BP160/82 O2 98%, rpe10 and RPD 11, Post Hr 58, BP 120/80 O298%, RPE 7, And RPD 7. Walked 2037ft at METS of 3.3., No Change  muscular strength  76 %, No Change  flexibility 12.5 % and Discharged to home exercise program.  Anticipated compliance:  excellent  B.  Mental Health:  Good mental attitude and Quality of Life (QOL)  improvements:  Overall  30 %, Health/Functioning 30 %, Socioeconomics 30 %, Psych/Spiritual 30 %, Family 30 %    C.  Education/Instruction/Skills  Accurately checks own pulse.  Rest:  52  Exercise: 76, Knows THR for exercise, Uses Perceived Exertion Scale and/or Dyspnea Scale and Attended all education classes  Uses Perceived Exertion Scale and/or Dyspnea Scale  D.  Nutrition/Weight Control/Body Composition:  Adherence to prescribed nutrition program: good  and Patient weight change: 2 lb weight gain   E.  Blood Lipids    Lab Results  Component Value Date   CHOL 125 03/19/2013   HDL 41 03/19/2013   LDLCALC 62 03/19/2013   TRIG 108 03/19/2013   CHOLHDL 3.0 03/19/2013    F.  Lifestyle Changes:  Making positive lifestyle changes and Not smoking:  Quit 2003  G.  Symptoms noted with exercise:  Asymptomatic  Report Completed By:  Lelon Huh. Pamelyn Bancroft RN   Comments:  This is patients graduation report. Patient completed the program 36 sessions. He achieved peak METS of 4.07. His resting HR was 52 and BP was 140/78. Peak HR was 76 and Peak Bp was 150/90. He did very well while in rehab.

## 2013-05-27 NOTE — Addendum Note (Signed)
Encounter addended by: Angelica Pou, RN on: 05/27/2013 10:03 AM<BR>     Documentation filed: Notes Section

## 2013-09-02 ENCOUNTER — Encounter: Payer: Self-pay | Admitting: Cardiology

## 2013-09-02 ENCOUNTER — Ambulatory Visit (INDEPENDENT_AMBULATORY_CARE_PROVIDER_SITE_OTHER): Payer: Medicare HMO | Admitting: Cardiology

## 2013-09-02 VITALS — BP 132/75 | HR 88 | Ht 74.0 in | Wt 205.0 lb

## 2013-09-02 DIAGNOSIS — I10 Essential (primary) hypertension: Secondary | ICD-10-CM

## 2013-09-02 DIAGNOSIS — I6529 Occlusion and stenosis of unspecified carotid artery: Secondary | ICD-10-CM

## 2013-09-02 DIAGNOSIS — E785 Hyperlipidemia, unspecified: Secondary | ICD-10-CM

## 2013-09-02 DIAGNOSIS — I2510 Atherosclerotic heart disease of native coronary artery without angina pectoris: Secondary | ICD-10-CM

## 2013-09-02 MED ORDER — IBUPROFEN 800 MG PO TABS: 800.0000 mg | ORAL_TABLET | Freq: Two times a day (BID) | ORAL | Status: DC | PRN

## 2013-09-02 NOTE — Patient Instructions (Signed)
Your physician wants you to follow-up in: 6 MONTHS You will receive a reminder letter in the mail two months in advance. If you don't receive a letter, please call our office to schedule the follow-up appointment.  Your physician has recommended you make the following change in your medication:   1) START TAKING ASPIRIN 81MG  ONCE DAILY 2) START TAKING IBUPROFEN 800MG  TWICE DAILY FOR 5 DAYS

## 2013-09-02 NOTE — Progress Notes (Signed)
Clinical Summary Mr. Pike is a 73 y.o.male is a former patient of Dr Rollene Fare at [facility], this is our first visit together. He is seen for the following medical problems.  1. CAD - prior 3 vessel CABG July 2013, LVEF around that time by LV gram was 55% - constant 24 hour discomfort in chest since his surgery, non-exertional. Worst with wearing his seatbelt - no SOB, no DOE. Goes to gym regularly, runs on treadmill, stair stepper etc without troubles.  - compliant with meds  2. Carotid stenosis - prior right CEA July 2013 - followed by vascular surgery  3. HTN - does not check regularly - compliant with meds  4. Hyperlipidemia - 03/2013: TC TG 108 HDL 41 LDL 72 - compliant with lipitor  5. OSA - compliant CPAP Past Medical History  Diagnosis Date  . Hypertension   . Hyperlipidemia   . CAD (coronary artery disease)   . Wears glasses     or contacts  . Deviated septum   . Thyroid nodule   . GERD (gastroesophageal reflux disease)   . Heart attack   . MI (myocardial infarction) 1997  . 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 CAD (coronary artery disease) 12/04/2011    Left Main Disease with 3- vessel CAD   . PONV (postoperative nausea and vomiting)     has in past, no problems after most recent surgery  . Hypothyroidism   . Shortness of breath     with exertion  . Sleep apnea     CPAP, sleep study at [facility]  . 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 S/P carotid endarterectomy 02/29/2012    Right CEA for asymptomatic severe right ICA stenosis   . CHF (congestive heart failure)      No Known Allergies   Current Outpatient Prescriptions  Medication Sig Dispense Refill  . aspirin 325 MG tablet Take 325 mg by mouth daily.      Marland Kitchen atorvastatin (LIPITOR) 20 MG tablet Take 1 tablet (20 mg total) by mouth daily at 6 PM.  30 tablet  1  . calcium carbonate  (OS-CAL) 600 MG TABS Take 600 mg by mouth daily.      . cholecalciferol (VITAMIN D) 1000 UNITS tablet Take 1,000 Units by mouth daily.      . Coenzyme Q10 (CO Q-10) 100 MG CAPS Take 100 mg by mouth daily.       . folic acid (FOLVITE) A999333 MCG tablet Take 400 mcg by mouth daily.      . metoprolol (LOPRESSOR) 50 MG tablet Take 1 tablet (50 mg total) by mouth 2 (two) times daily.  60 tablet  1  . Misc Natural Products (OSTEO BI-FLEX ADV DOUBLE ST) TABS Take 1 tablet by mouth 2 (two) times daily.       . Multiple Vitamin (MULTIVITAMIN WITH MINERALS) TABS Take 1 tablet by mouth daily.      . niacin 500 MG tablet Take 500 mg by mouth daily with breakfast.      . Omega-3 Fatty Acids (FISH OIL) 1000 MG CPDR Take 1,000 mg by mouth 2 (two) times daily.       . Probiotic Product (PROBIOTIC DAILY PO) Take by mouth 1 day or 1 dose.      . Psyllium (METAMUCIL PO) Take 3 capsules by mouth daily.       Marland Kitchen  ramipril (ALTACE) 10 MG capsule Take 1 capsule by mouth daily.      Marland Kitchen SYNTHROID 125 MCG tablet Take 1 tablet (125 mcg total) by mouth daily.  30 tablet  6  . zolpidem (AMBIEN) 10 MG tablet Take 10 mg by mouth at bedtime as needed for sleep.       No current facility-administered medications for this visit.     Past Surgical History  Procedure Laterality Date  . Coronary stent placement    . Nasal septum surgery    . Totol thyroidectomy  02/06/2011  . Foot surgery  06/30/11    artificial joint in big toe right foot   . Colonoscopy  15-20years and 12/04/11    Done at [facility]  . Upper gastrointestinal endoscopy  35-40 years ago    [facility]  . Colonoscopy  12/11/2011    Procedure: COLONOSCOPY;  Surgeon: Rogene Houston, MD;  Location: [facility];  Service: Endoscopy;  Laterality: N/A;  830  . Cardiac catheterization      02/22/12  . Adenoidectomy    . Cardiovascular stress test  7/13  . Transesophageal echocardiogram    . Coronary artery bypass graft  02/29/2012    Procedure: CORONARY  ARTERY BYPASS GRAFTING (CABG);  Surgeon: Rexene Alberts, MD;  Location: [place];  Service: Open Heart Surgery;  Laterality: N/A;  times three using Left Internal Mammary Artery and Right Greater Saphenous Vein Graft Harvested Endoscopically  . Endarterectomy  02/29/2012    Procedure: ENDARTERECTOMY CAROTID;  Surgeon: Angelia Mould, MD;  Location: [facility] OR;  Service: Vascular;  Laterality: Right;  . Carotid endarterectomy       No Known Allergies    Family History  Problem Relation Age of Onset  . Hypertension Mother   . Healthy Sister   . Healthy Brother   . Healthy Son   . Colon cancer Neg Hx   . Heart attack Father      Social History Mr. Hard reports that he quit smoking about 12 years ago. His smoking use included Cigarettes. He smoked 0.00 packs per day. He has never used smokeless tobacco. Mr. Apostol reports that he drinks about 1.8 ounces of alcohol per week.   Review of Systems CONSTITUTIONAL: No weight loss, fever, chills, weakness or fatigue.  HEENT: Eyes: No visual loss, blurred vision, double vision or yellow sclerae.No hearing loss, sneezing, congestion, runny nose or sore throat.  SKIN: No rash or itching.  CARDIOVASCULAR: per HPI RESPIRATORY: No shortness of breath, cough or sputum.  GASTROINTESTINAL: No anorexia, nausea, vomiting or diarrhea. No abdominal pain or blood.  GENITOURINARY: No burning on urination, no polyuria NEUROLOGICAL: No headache, dizziness, syncope, paralysis, ataxia, numbness or tingling in the extremities. No change in bowel or bladder control.  MUSCULOSKELETAL: No muscle, back pain, joint pain or stiffness.  LYMPHATICS: No enlarged nodes. No history of splenectomy.  PSYCHIATRIC: No history of depression or anxiety.  ENDOCRINOLOGIC: No reports of sweating, cold or heat intolerance. No polyuria or polydipsia.  Marland Kitchen   Physical Examination p 88 bp 132/75 Wt 205 lbs BMI 26 Gen: resting comfortably, no acute distress HEENT: no scleral  icterus, pupils equal round and reactive, no palptable cervical adenopathy,  CV: RRR, no m/r/g, no JVD, no carotid bruits Resp: Clear to auscultation bilaterally GI: abdomen is soft, non-tender, non-distended, normal bowel sounds, no hepatosplenomegaly MSK: extremities are warm, no edema.  Skin: warm, no rash Neuro:  no focal deficits Psych: appropriate affect  Diagnostic Studies 02/2012 Cath 1. Normal left ventricular function with an ejection fraction of at  least 55% with suggestion of minimal posterobasal inferior  hypocontractility.  2. Extensive coronary calcification involving the left main, left  anterior descending artery, left circumflex, and right coronary  arteries.  3. Probable ostial tapering of the left main to 60-70% with evidence  for AO pressure dampening by 10-15 mm concordant with ostial  stenosis.  4. 40% proximal circumflex with 70% circumflex marginal 1 stenosis.  5. Total occlusion of the right coronary artery at the ostium with  extensive collateralization to the posterior descending artery  vessel via the left coronary circulation.   03/2013 Echo LVEF 55-60%, no WMAs, grade I diastolic dysfunction, mild MR  02/2013 Carotid US Patent right CEA, <40% stenosis on left.   Assessment and Plan  1. CAD - no current symptoms - continue risk factor modification and secondary prevention, will change ASA to 81mg  daily.  2. Carotid stenosis - recent US shows no significant disease - continue follow up with vascular  3. HTN - at goal, continue current meds  4. Hyperlipidemia - at goal, contineu current meds.   5. Musculoskeletal chest pain - given prescription for ibuprofen 800mg  bid x 5 days, likely inflammation related to prior surgical site.     Follow up 6 months   Arnoldo Lenis, M.D., F.A.C.C.

## 2013-09-03 ENCOUNTER — Telehealth: Payer: Self-pay | Admitting: Cardiology

## 2013-09-03 NOTE — Telephone Encounter (Signed)
Please see paper in refill bin / tgs  °

## 2013-09-11 ENCOUNTER — Telehealth: Payer: Self-pay | Admitting: Cardiology

## 2013-09-11 NOTE — Telephone Encounter (Signed)
Please see paper in refill bin / tgs  °

## 2013-09-15 ENCOUNTER — Telehealth: Payer: Self-pay

## 2013-09-15 NOTE — Telephone Encounter (Signed)
Waiting for MD signature

## 2013-09-15 NOTE — Telephone Encounter (Signed)
Received fax from pharmacy regarding clarification of medications.  Also additional information needed on script.  Please review.

## 2013-09-15 NOTE — Telephone Encounter (Signed)
Noted MD is in the [place] office all week, noted the fax to advise the pt had a refill sent on 09-03-13 for metoprolol tartrate 50mg  BID, however pt history is metoprolol tartrate 100mg  BID please clarify pt correct dosage, noted pt recent OV with Dr Harl Bowie and last OV with Vas Surgeon noted Metoprolol Tartrate 50mg  BID, please advise

## 2013-09-16 NOTE — Telephone Encounter (Signed)
Metoprolol should be 50mg  po bid   Carlyle Dolly MD

## 2013-09-16 NOTE — Telephone Encounter (Signed)
Called [facility] to give verbal order was advised by rep Jonni Sanger that the computers are currently experiencing issues to please call back at a later time

## 2013-09-18 NOTE — Telephone Encounter (Signed)
Called [facility] again to advise rep Claiborne Rigg however she advised that pt PCP Emi Belfast sent in a prescription after pt Dr Dondra Spry on 09-11-13 for Metoprolol Tartrate 100mg  BID, this nurse then called Bree with Dr Nevada Crane to clarify this was changed for the pt, was advised that they haven't seen the pt for sometime and they only sent in a prescription for 100mg  take half (50mg ) BID, called [facility] back

## 2013-09-23 MED ORDER — METOPROLOL TARTRATE 50 MG PO TABS: 50.0000 mg | ORAL_TABLET | Freq: Two times a day (BID) | ORAL | Status: AC

## 2013-09-23 NOTE — Telephone Encounter (Signed)
Noted this nurse has been out of office for four days, corrected dosage sent to [facility] via fax to advise PCP dosage clarified if any questions to please contact our office again, pt aware via vm the Rx will be mailed via [facility] protocol

## 2013-09-30 ENCOUNTER — Other Ambulatory Visit: Payer: Self-pay | Admitting: Vascular Surgery

## 2013-09-30 DIAGNOSIS — I6529 Occlusion and stenosis of unspecified carotid artery: Secondary | ICD-10-CM

## 2013-09-30 DIAGNOSIS — Z48812 Encounter for surgical aftercare following surgery on the circulatory system: Secondary | ICD-10-CM

## 2013-10-10 ENCOUNTER — Emergency Department (HOSPITAL_COMMUNITY): Payer: Medicare HMO

## 2013-10-10 ENCOUNTER — Emergency Department (HOSPITAL_COMMUNITY)
Admission: EM | Admit: 2013-10-10 | Discharge: 2013-10-10 | Disposition: A | Discharge status: Home / Self Care | Payer: Medicare HMO | Attending: Emergency Medicine | Admitting: Emergency Medicine

## 2013-10-10 ENCOUNTER — Encounter (HOSPITAL_COMMUNITY): Payer: Self-pay | Admitting: Emergency Medicine

## 2013-10-10 DIAGNOSIS — W1809XA Striking against other object with subsequent fall, initial encounter: Secondary | ICD-10-CM | POA: Insufficient documentation

## 2013-10-10 DIAGNOSIS — E041 Nontoxic single thyroid nodule: Secondary | ICD-10-CM | POA: Insufficient documentation

## 2013-10-10 DIAGNOSIS — Z9889 Other specified postprocedural states: Secondary | ICD-10-CM | POA: Insufficient documentation

## 2013-10-10 DIAGNOSIS — Z951 Presence of aortocoronary bypass graft: Secondary | ICD-10-CM | POA: Insufficient documentation

## 2013-10-10 DIAGNOSIS — Z9861 Coronary angioplasty status: Secondary | ICD-10-CM | POA: Insufficient documentation

## 2013-10-10 DIAGNOSIS — I252 Old myocardial infarction: Secondary | ICD-10-CM | POA: Insufficient documentation

## 2013-10-10 DIAGNOSIS — S0083XA Contusion of other part of head, initial encounter: Principal | ICD-10-CM

## 2013-10-10 DIAGNOSIS — IMO0002 Reserved for concepts with insufficient information to code with codable children: Secondary | ICD-10-CM | POA: Insufficient documentation

## 2013-10-10 DIAGNOSIS — Z7982 Long term (current) use of aspirin: Secondary | ICD-10-CM | POA: Insufficient documentation

## 2013-10-10 DIAGNOSIS — K219 Gastro-esophageal reflux disease without esophagitis: Secondary | ICD-10-CM | POA: Insufficient documentation

## 2013-10-10 DIAGNOSIS — S1093XA Contusion of unspecified part of neck, initial encounter: Principal | ICD-10-CM

## 2013-10-10 DIAGNOSIS — I10 Essential (primary) hypertension: Secondary | ICD-10-CM | POA: Insufficient documentation

## 2013-10-10 DIAGNOSIS — E039 Hypothyroidism, unspecified: Secondary | ICD-10-CM | POA: Insufficient documentation

## 2013-10-10 DIAGNOSIS — E785 Hyperlipidemia, unspecified: Secondary | ICD-10-CM | POA: Insufficient documentation

## 2013-10-10 DIAGNOSIS — S298XXA Other specified injuries of thorax, initial encounter: Secondary | ICD-10-CM | POA: Insufficient documentation

## 2013-10-10 DIAGNOSIS — Z87891 Personal history of nicotine dependence: Secondary | ICD-10-CM | POA: Insufficient documentation

## 2013-10-10 DIAGNOSIS — Y9289 Other specified places as the place of occurrence of the external cause: Secondary | ICD-10-CM | POA: Insufficient documentation

## 2013-10-10 DIAGNOSIS — G4730 Sleep apnea, unspecified: Secondary | ICD-10-CM | POA: Insufficient documentation

## 2013-10-10 DIAGNOSIS — Z79899 Other long term (current) drug therapy: Secondary | ICD-10-CM | POA: Insufficient documentation

## 2013-10-10 DIAGNOSIS — Z23 Encounter for immunization: Secondary | ICD-10-CM | POA: Insufficient documentation

## 2013-10-10 DIAGNOSIS — I509 Heart failure, unspecified: Secondary | ICD-10-CM | POA: Insufficient documentation

## 2013-10-10 DIAGNOSIS — S0001XA Abrasion of scalp, initial encounter: Secondary | ICD-10-CM

## 2013-10-10 DIAGNOSIS — R0602 Shortness of breath: Secondary | ICD-10-CM | POA: Insufficient documentation

## 2013-10-10 DIAGNOSIS — S0003XA Contusion of scalp, initial encounter: Secondary | ICD-10-CM | POA: Insufficient documentation

## 2013-10-10 DIAGNOSIS — Y9389 Activity, other specified: Secondary | ICD-10-CM | POA: Insufficient documentation

## 2013-10-10 DIAGNOSIS — I2510 Atherosclerotic heart disease of native coronary artery without angina pectoris: Secondary | ICD-10-CM | POA: Insufficient documentation

## 2013-10-10 DIAGNOSIS — Z8709 Personal history of other diseases of the respiratory system: Secondary | ICD-10-CM | POA: Insufficient documentation

## 2013-10-10 DIAGNOSIS — Z9981 Dependence on supplemental oxygen: Secondary | ICD-10-CM | POA: Insufficient documentation

## 2013-10-10 IMAGING — CT CT HEAD W/O CM
4 of 5 series · 14 of 47 positions shown, 15 images · non-contrast
Comparison: None.

CLINICAL DATA: Fall

EXAM:
CT HEAD WITHOUT CONTRAST
CT CERVICAL SPINE WITHOUT CONTRAST
TECHNIQUE: Multidetector CT imaging of the head and cervical spine was
performed following the standard protocol without intravenous
contrast. Multiplanar CT image reconstructions of the cervical spine
were also generated.

[Series 2: headseq 4.8 h37s · axial · 0.47mm/px · z∈[+354,+447]mm · 3 of 36 slices shown, 4 images]
[im 9/36  brain]
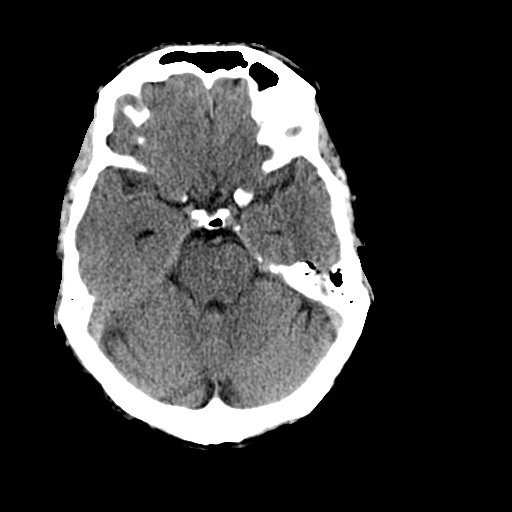
[im 9/36  bone]
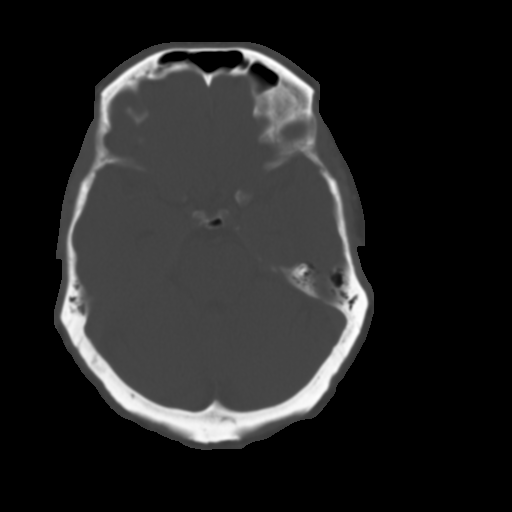
[im 18/36  brain]
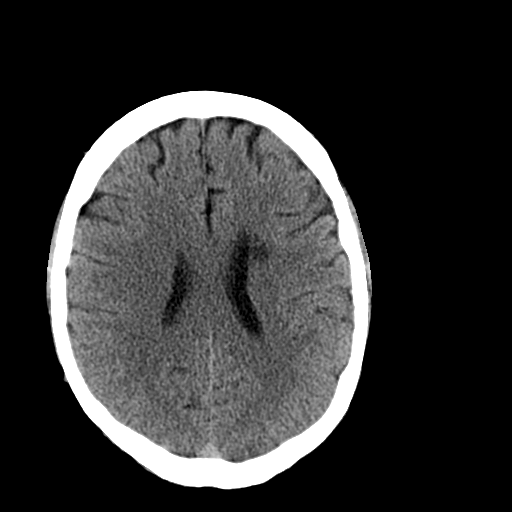
[im 27/36  brain]
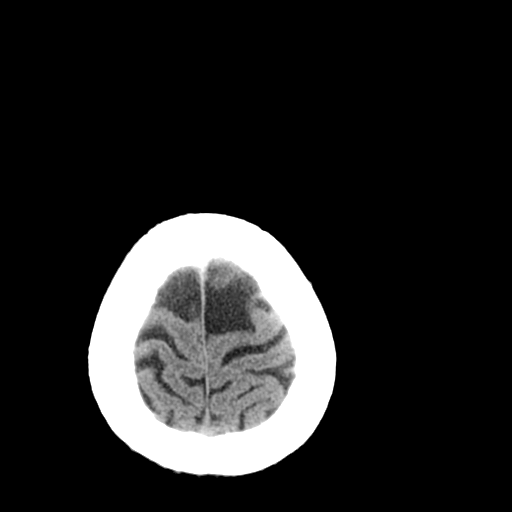

[Series 7: sagittal bone 2.0 · sagittal · 0.29mm/px · 3 of 59 slices shown]
[im 20/59  brain]
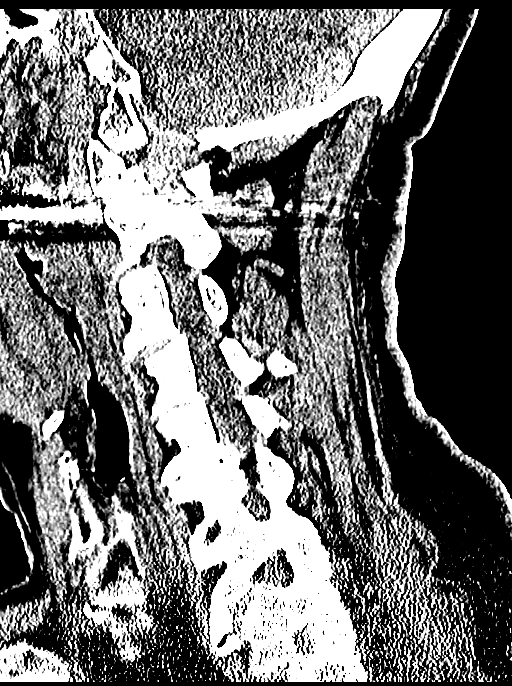
[im 30/59  brain]
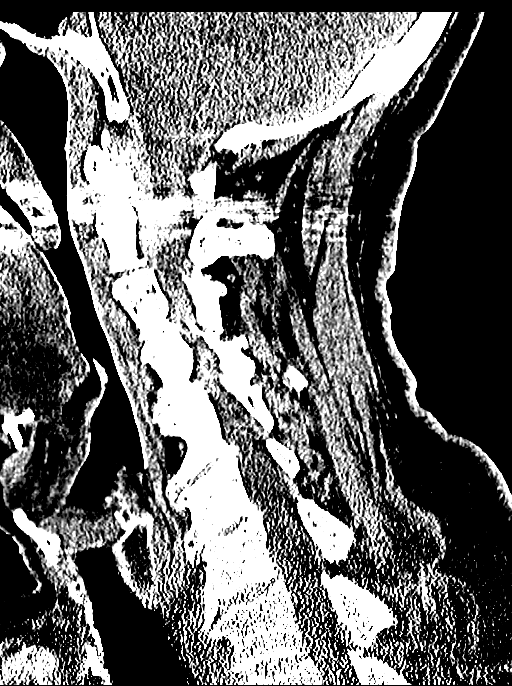
[im 39/59  brain]
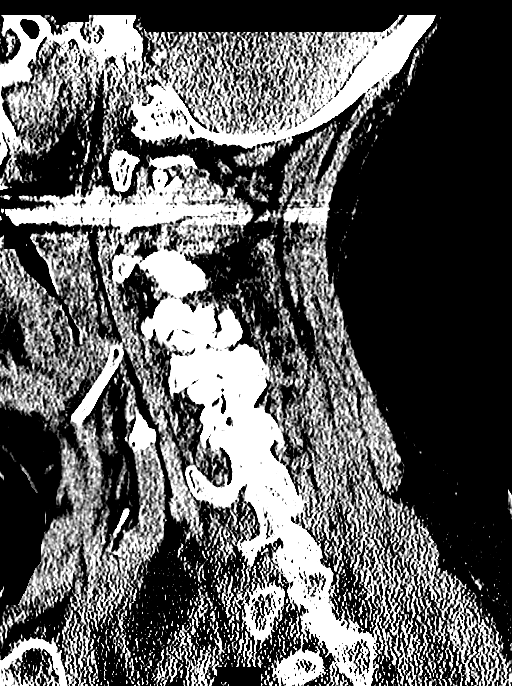

[Series 8: coronal bone 2.0 · coronal · 0.23mm/px · 3 of 53 slices shown]
[im 18/53  brain]
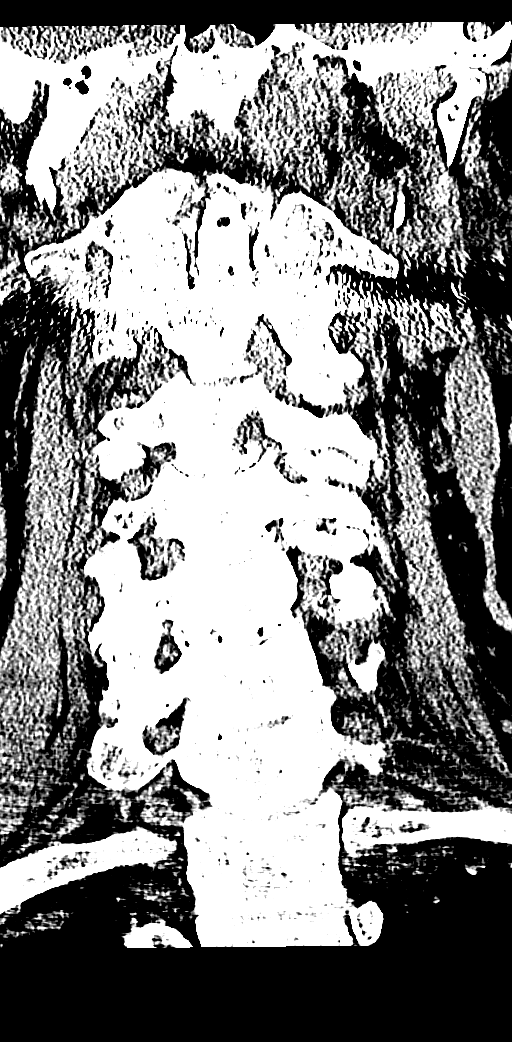
[im 24/53  brain]
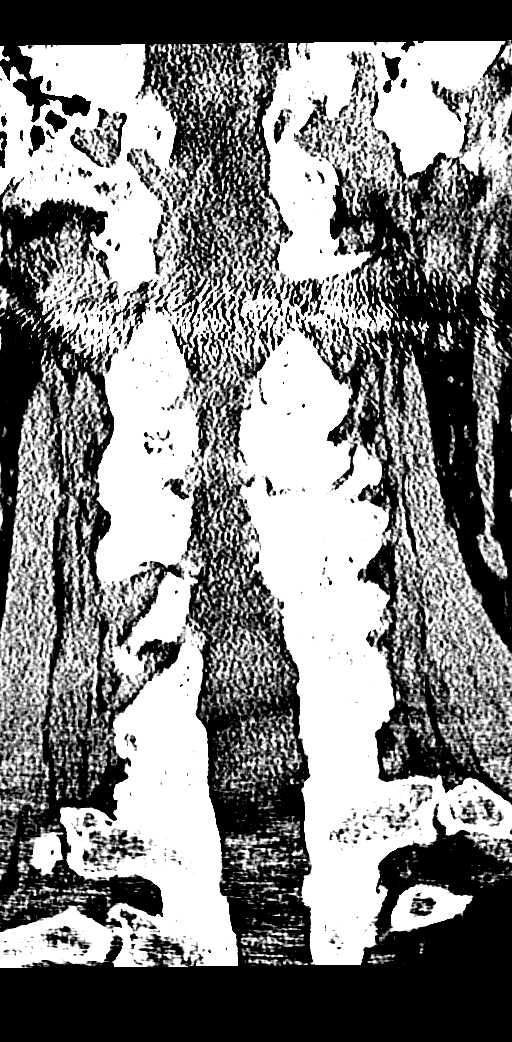
[im 29/53  brain]
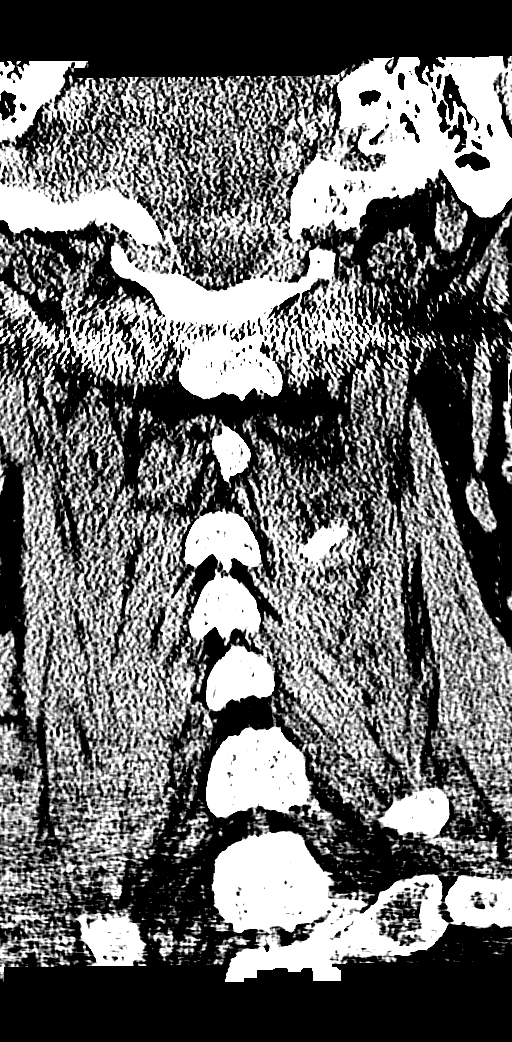

[Series 9: axial bone 2.0 · axial · 0.22mm/px · z∈[+88,+191]mm · 5 of 110 slices shown]
[im 10/110  bone]
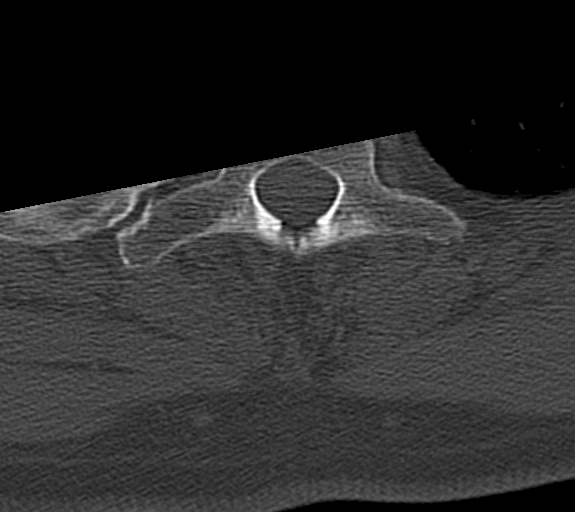
[im 28/110  bone]
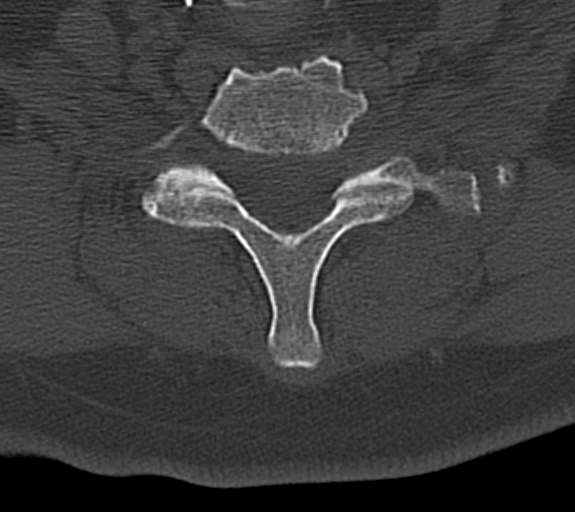
[im 37/110  bone]
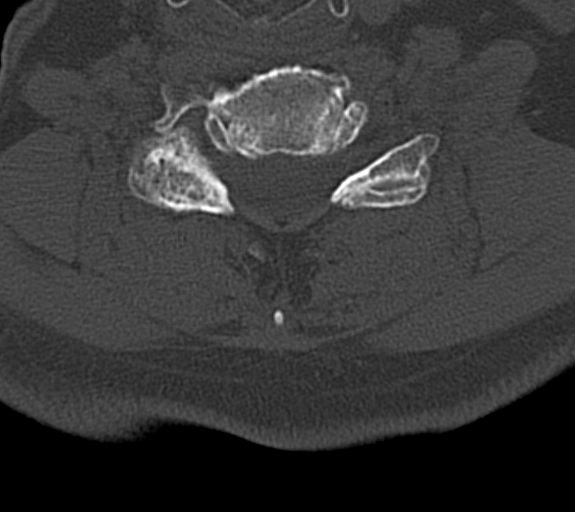
[im 46/110  bone]
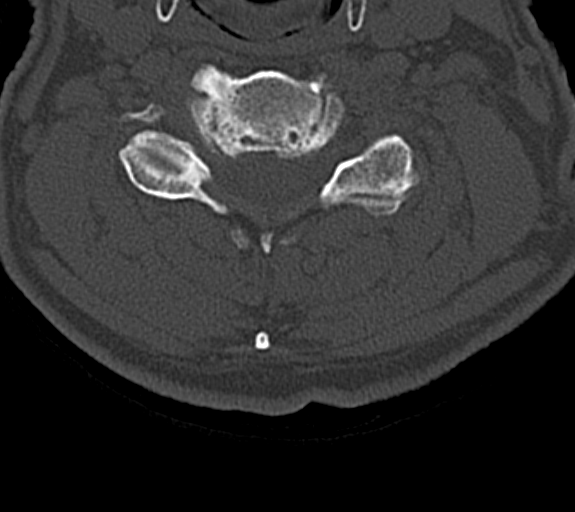
[im 64/110  bone]
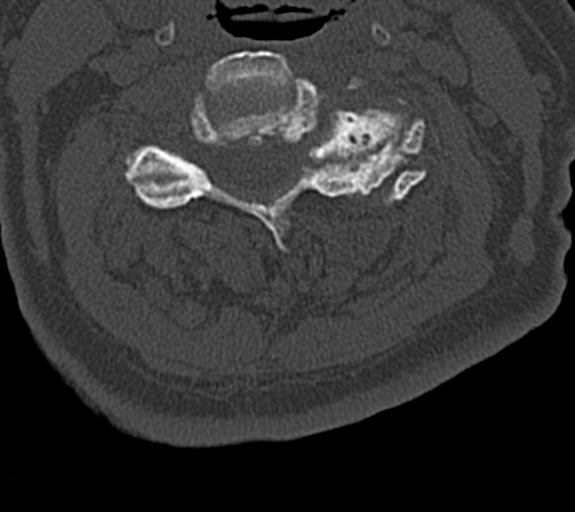

[14 of 47 positions shown; findings below may reference images not displayed]

FINDINGS: CT HEAD FINDINGS

Chronic deep white matter infarct on the left. No acute infarct.
Negative for hemorrhage or mass. Negative for skull fracture.

Mucosal edema and air-fluid levels in the paranasal sinuses.

CT CERVICAL SPINE FINDINGS

Negative for fracture or mass lesion.

Cervical degenerative changes. There is marked facet hypertrophy on
the left at C2-3, C3-4, C4-5, and C5-6. Disc degeneration and
spondylosis is present throughout the cervical spine. 2 mm anterior
slip C3-4 appears degenerative.

Prior thyroidectomy bilaterally. Atherosclerotic disease. Extensive
calcification and significant stenosis of the left carotid artery.
IMPRESSION: No acute intracranial abnormality.

Sinusitis with air-fluid levels.

Cervical degenerative changes. Negative for fracture.

## 2013-10-10 MED ORDER — TETANUS-DIPHTH-ACELL PERTUSSIS 5-2.5-18.5 LF-MCG/0.5 IM SUSP
INTRAMUSCULAR | Status: AC
  Administered 2013-10-10: 0.5 mL via INTRAMUSCULAR
  Filled 2013-10-10: qty 0.5

## 2013-10-10 NOTE — Discharge Instructions (Signed)
The CT scan appears: Brain are negative for any acute fracture or changes. The cervical spine CT is also negative for any occult fracture or problem. The CT of the spine does show some arthritis changes present. Please allow the abrasion of your scalp to heal. Please allow the Steri-Strip to come off on its own. If there is any increase redness, pus drainage, or changes, please see Dr. Nevada Crane, or return to the emergency department for evaluation. Facial or Scalp Contusion A facial or scalp contusion is a deep bruise on the face or head. Injuries to the face and head generally cause a lot of swelling, especially around the eyes. Contusions are the result of an injury that caused bleeding under the skin. The contusion may turn blue, purple, or yellow. Minor injuries will give you a painless contusion, but more severe contusions may stay painful and swollen for a few weeks.  CAUSES  A facial or scalp contusion is caused by a blunt injury or trauma to the face or head area.  SIGNS AND SYMPTOMS   Swelling of the injured area.   Discoloration of the injured area.   Tenderness, soreness, or pain in the injured area.  DIAGNOSIS  The diagnosis can be made by taking a medical history and doing a physical exam. An X-ray exam, CT scan, or MRI may be needed to determine if there are any associated injuries, such as broken bones (fractures). TREATMENT  Often, the best treatment for a facial or scalp contusion is applying cold compresses to the injured area. Over-the-counter medicines may also be recommended for pain control.  HOME CARE INSTRUCTIONS   Only take over-the-counter or prescription medicines as directed by your health care provider.   Apply ice to the injured area.   Put ice in a plastic bag.   Place a towel between your skin and the bag.   Leave the ice on for 20 minutes, 2 3 times a day.  SEEK MEDICAL CARE IF:  You have bite problems.   You have pain with chewing.   You are  concerned about facial defects. SEEK IMMEDIATE MEDICAL CARE IF:  You have severe pain or a headache that is not relieved by medicine.   You have unusual sleepiness, confusion, or personality changes.   You throw up (vomit).   You have a persistent nosebleed.   You have double vision or blurred vision.   You have fluid drainage from your nose or ear.   You have difficulty walking or using your arms or legs.  MAKE SURE YOU:   Understand these instructions.  Will watch your condition.  Will get help right away if you are not doing well or get worse. Document Released: 09/07/2004 Document Revised: 05/21/2013 Document Reviewed: 03/13/2013 [facility] Patient Information 2014 [place], [place].

## 2013-10-10 NOTE — ED Notes (Signed)
Lac cleansed, and steri strips placed

## 2013-10-10 NOTE — ED Provider Notes (Signed)
Medical screening examination/treatment/procedure(s) were performed by non-physician practitioner and as supervising physician I was immediately available for consultation/collaboration.  EKG Interpretation  None    Hoy Morn, MD 10/10/13 [phone]

## 2013-10-10 NOTE — ED Notes (Signed)
Slipped on ice and fell backwards. Struck back of head,  No LOC, alert, denies neck or back pain.  Says he does not hurt at all, but concerned about superficial lac to post scalp. No active bleeding.  Pts wife cleaned lac pta.

## 2013-10-10 NOTE — ED Notes (Signed)
[place] states he was [place] and fell on a step that had ice. Denies LOC

## 2013-10-10 NOTE — ED Provider Notes (Signed)
CSN: [id number]     Arrival date & time 10/10/13  1027 History   First MD Initiated Contact with Patient 10/10/13 1108     Chief Complaint  Patient presents with  . Fall     (Consider location/radiation/quality/duration/timing/severity/associated sxs/prior Treatment) HPI Comments: Patient is a 73 year old male who presents to the emergency department with complaint of a fall, and bruise to the back of the head. The patient and his wife state that he was at the [facility], coming down some steps when he hit the last step that had ice on it, sustained a fall, and injured the back of the head. There was no loss of consciousness. The patient states since the incident happened he has not had vomiting, loss of bowel or bladder function, or vision changes. The family was concerned because the patient had abrasions of the back of the scalp and they now present to the emergency department for additional evaluation and management. Patient is unsure of the date of his last tetanus.  Patient is a 73 y.o. male presenting with fall. The history is provided by the patient.  Fall Associated symptoms include chest pain. Pertinent negatives include no abdominal pain, arthralgias, coughing or neck pain.    Past Medical History  Diagnosis Date  . Hypertension   . Hyperlipidemia   . CAD (coronary artery disease)   . Wears glasses     or contacts  . Deviated septum   . Thyroid nodule   . GERD (gastroesophageal reflux disease)   . Heart attack   . MI (myocardial infarction) 1997  . 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 CAD (coronary artery disease) 12/04/2011    Left Main Disease with 3- vessel CAD   . PONV (postoperative nausea and vomiting)     has in past, no problems after most recent surgery  . Hypothyroidism   . Shortness of breath     with exertion  . Sleep apnea     CPAP, sleep study at [facility]  . 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 S/P carotid endarterectomy 02/29/2012    Right CEA for asymptomatic severe right ICA stenosis   . CHF (congestive heart failure)    Past Surgical History  Procedure Laterality Date  . Coronary stent placement    . Nasal septum surgery    . Totol thyroidectomy  02/06/2011  . Foot surgery  06/30/11    artificial joint in big toe right foot   . Colonoscopy  15-20years and 12/04/11    Done at [facility]  . Upper gastrointestinal endoscopy  35-40 years ago    [facility]  . Colonoscopy  12/11/2011    Procedure: COLONOSCOPY;  Surgeon: Rogene Houston, MD;  [facility];  Service: Endoscopy;  Laterality: N/A;  830  . Cardiac catheterization      02/22/12  . Adenoidectomy    . Cardiovascular stress test  7/13  . Transesophageal echocardiogram    . Coronary artery bypass graft  02/29/2012    Procedure: CORONARY ARTERY BYPASS GRAFTING (CABG);  Surgeon: Rexene Alberts, MD;  Location: [place];  Service: Open Heart Surgery;  Laterality: N/A;  times three using Left Internal Mammary Artery and Right Greater Saphenous Vein Graft Harvested Endoscopically  . Endarterectomy  02/29/2012    Procedure: ENDARTERECTOMY CAROTID;  Surgeon: Angelia Mould, MD;  Location: [facility] OR;  Service: Vascular;  Laterality: Right;  . Carotid endarterectomy     Family History  Problem Relation Age of Onset  . Hypertension Mother   . Healthy Sister   . Healthy Brother   . Healthy Son   . Colon cancer Neg Hx   . Heart attack Father    History  Substance Use Topics  . Smoking status: Former Smoker    Types: Cigarettes    Quit date: 09/04/2001  . Smokeless tobacco: Never Used     Comment: smoked 31yrs, smoked 1/2 pack a day  . Alcohol Use: 1.8 - 2.4 oz/week    3-4 Cans of beer per week     Comment: social- beer , not on regular basis    Review of Systems  Constitutional: Negative for activity change.       All ROS Neg except as noted in HPI  HENT: Negative  for nosebleeds.   Eyes: Negative for photophobia and discharge.  Respiratory: Positive for shortness of breath. Negative for cough and wheezing.   Cardiovascular: Positive for chest pain. Negative for palpitations.  Gastrointestinal: Negative for abdominal pain and blood in stool.  Endocrine: Negative.   Genitourinary: Negative for dysuria, frequency and hematuria.  Musculoskeletal: Negative for arthralgias, back pain and neck pain.  Skin: Negative.   Allergic/Immunologic: Negative.   Neurological: Negative for dizziness, seizures and speech difficulty.  Hematological: Negative.   Psychiatric/Behavioral: Negative.  Negative for hallucinations and confusion.      Allergies  Review of patient's allergies indicates no known allergies.  Home Medications   Current Outpatient Rx  Name  Route  Sig  Dispense  Refill  . aspirin EC 81 MG tablet   Oral   Take 81 mg by mouth daily.         Marland Kitchen atorvastatin (LIPITOR) 20 MG tablet   Oral   Take 1 tablet (20 mg total) by mouth daily at 6 PM.   30 tablet   1   . folic acid (FOLVITE) A999333 MCG tablet   Oral   Take 400 mcg by mouth daily.         . metoprolol (LOPRESSOR) 50 MG tablet   Oral   Take 1 tablet (50 mg total) by mouth 2 (two) times daily.   180 tablet   1     PT PCP SENT IN FOR THE SAME MEDICATION HOWEVER NOT .Marland Kitchen.   . Misc Natural Products (OSTEO BI-FLEX ADV DOUBLE ST) TABS   Oral   Take 1 tablet by mouth 2 (two) times daily.          . Multiple Vitamin (MULTIVITAMIN WITH MINERALS) TABS   Oral   Take 1 tablet by mouth daily.         . Omega-3 Fatty Acids (FISH OIL) 1000 MG CPDR   Oral   Take 1,000 mg by mouth 2 (two) times daily.          Marland Kitchen omeprazole (PRILOSEC) 20 MG capsule   Oral   Take 20 mg by mouth daily.         . Psyllium (METAMUCIL PO)   Oral   Take 3 capsules by mouth daily.          . ramipril (ALTACE) 10 MG capsule   Oral   Take 1 capsule by mouth daily.         Marland Kitchen SYNTHROID 125 MCG  tablet   Oral   Take 1 tablet (125 mcg total) by  mouth daily.   30 tablet   6     Dispense as written.   . zolpidem (AMBIEN) 10 MG tablet   Oral   Take 10 mg by mouth at bedtime as needed for sleep.          BP 161/82  Pulse 64  Temp(Src) 98.6 F (37 C)  Resp 20  Ht 6\' 1"  (1.854 m)  Wt 195 lb (88.451 kg)  BMI 25.73 kg/m2  SpO2 98% Physical Exam  Nursing note and vitals reviewed. Constitutional: He is oriented to person, place, and time. He appears well-developed and well-nourished.  Non-toxic appearance.  HENT:  Head: Normocephalic.    Right Ear: Tympanic membrane and external ear normal.  Left Ear: Tympanic membrane and external ear normal.  Eyes: EOM and lids are normal. Pupils are equal, round, and reactive to light.  Neck: Normal range of motion. Neck supple. Carotid bruit is not present.  Cardiovascular: Normal rate, regular rhythm, normal heart sounds, intact distal pulses and normal pulses.   Pulmonary/Chest: Breath sounds normal. No respiratory distress.  Abdominal: Soft. Bowel sounds are normal. There is no tenderness. There is no guarding.  Musculoskeletal: Normal range of motion.  Lymphadenopathy:       Head (right side): No submandibular adenopathy present.       Head (left side): No submandibular adenopathy present.    He has no cervical adenopathy.  Neurological: He is alert and oriented to person, place, and time. He has normal strength. No cranial nerve deficit or sensory deficit. Gait normal. GCS eye subscore is 4. GCS verbal subscore is 5. GCS motor subscore is 6.  Speech clear. No gross neurologic deficit.  Skin: Skin is warm and dry.  Psychiatric: He has a normal mood and affect. His speech is normal.    ED Course Steri-strip applied to abrasion of the scalp by nursing staff.  Procedures (including critical care time) Labs Review Labs Reviewed - No data to display Imaging Review Ct Cervical Spine Wo Contrast  10/10/2013   CLINICAL DATA:   Fall  EXAM: CT HEAD WITHOUT CONTRAST  CT CERVICAL SPINE WITHOUT CONTRAST  TECHNIQUE: Multidetector CT imaging of the head and cervical spine was performed following the standard protocol without intravenous contrast. Multiplanar CT image reconstructions of the cervical spine were also generated.  COMPARISON:  None.  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 No acute infarct. Negative for hemorrhage or mass. Negative for skull fracture.  Mucosal edema  and air-fluid levels in the paranasal sinuses.  CT CERVICAL SPINE FINDINGS  Negative for fracture or mass lesion.  Cervical degenerative changes. There is marked facet hypertrophy on the left at C2-3, C3-4, C4-5, and C5-6. Disc degeneration and spondylosis is present throughout the cervical spine. 2 mm anterior slip C3-4 appears degenerative.  Prior thyroidectomy bilaterally. Atherosclerotic disease. Extensive calcification and significant stenosis of the left carotid artery.  IMPRESSION: No acute intracranial abnormality.  Sinusitis with air-fluid levels.  Cervical degenerative changes.  Negative for fracture.   Electronically Signed   By: Franchot Gallo M.D.   On: 10/10/2013 12:07    EKG Interpretation  None  MDM Patient sustained a fall after slipping on icy step earlier today. There was no loss of consciousness. Patient has some abrasions of the scalp and a hematoma. The family was concerned for possible injury and presents now to the emergency department for evaluation.  CT head scan is negative for hemorrhage, mass, or skull fracture.  CT cervical spine reveals disc degeneration and spondylosis present throughout the cervical spine. There is no fracture or significant change noted on the CT.  Patient amateur to the bathroom without any problem whatsoever. Field it is safe for the patient to return home and resume normal activities. Patient given instructions to return if any changes in vision, excessive vomiting,  changes in speech, changes in strength, or changes in gait.    Final diagnoses:  Contusion of scalp  Abrasion of scalp    *I have reviewed nursing notes, vital signs, and all appropriate lab and imaging results for this patient.Lenox Ahr, PA-C 10/10/13 1404

## 2013-12-15 ENCOUNTER — Telehealth: Payer: Self-pay | Admitting: *Deleted

## 2013-12-15 NOTE — Telephone Encounter (Signed)
Did you want to take over for CPAP? Please advise

## 2013-12-15 NOTE — Telephone Encounter (Signed)
Pt states that when with [facility] he was put on a CPAP machine, now that his insurance has changed they needs out NPI for Dr Thelma Comp since we are his new cardiologist. I was not sure if we were taking over this care for him or not please call patient. He uses [facility] cpap machine. [employer] uses [facility] for supplies so he is switching companies. There phone is [phone] and fax [phone].

## 2013-12-16 NOTE — Telephone Encounter (Signed)
Usually the primary care physician handles CPAP, is his pcp able to provide this service?   [name] Abts MD

## 2013-12-17 ENCOUNTER — Telehealth: Payer: Self-pay | Admitting: Cardiovascular Disease

## 2013-12-17 NOTE — Telephone Encounter (Signed)
Please call ,she is trying to get supplies for his sleep machine.

## 2013-12-17 NOTE — Telephone Encounter (Signed)
Pt called office back, made them aware that Dr Harl Bowie will not manage CPAP.

## 2013-12-17 NOTE — Telephone Encounter (Signed)
Spoke with Dr Harl Bowie and he will not manage the CPAP.

## 2013-12-17 NOTE — Telephone Encounter (Signed)
Left message for pt to make them aware

## 2013-12-17 NOTE — Telephone Encounter (Signed)
Returned call and pt verified x 2 w/ Velva Harman, pt's wife.  Stated [facility] has a new supplier and they need pt's records and a referral from the doctor.  Stated pt is being seen by another cardiologist and he nor pt's PCP will do the referral b/c they don't have the sleep study information.  Wife informed of options: (1) pt continues to see Dr. Claiborne Billings and company transfer will be completed and pt f/u with Dr. Claiborne Billings as required; (2) pt finds another provider to manage and records sent there.  Wife wants pt to continue w/ Dr. Claiborne Billings so he can get supplies.  Informed Mariann Laster will be notified and will contact her with updates or any information needed.  Wife verbalized understanding and stated the company is [employer].  Phone: [phone]  Fax: [phone].    Message forwarded to [place], [place].

## 2013-12-17 NOTE — Telephone Encounter (Signed)
Called pt and left a message.

## 2013-12-17 NOTE — Telephone Encounter (Signed)
Returning your call. °

## 2013-12-17 NOTE — Telephone Encounter (Signed)
Returned call.  Left message to call back before 4pm.  

## 2013-12-23 ENCOUNTER — Telehealth: Payer: Self-pay | Admitting: Cardiovascular Disease

## 2013-12-23 ENCOUNTER — Telehealth: Payer: Self-pay | Admitting: *Deleted

## 2013-12-23 NOTE — Telephone Encounter (Signed)
Returning your call. °

## 2013-12-23 NOTE — Telephone Encounter (Signed)
Left message that patient's records have been sent to [facility] along with a order to manage his CPAP and supplies. Records sent to the "888" number that she left in the previous phone conversation with [employer].

## 2013-12-23 NOTE — Telephone Encounter (Signed)
Faxed order and records to [facility] to manage patient's CPAP and supplies.

## 2014-02-20 ENCOUNTER — Telehealth: Payer: Self-pay | Admitting: *Deleted

## 2014-02-20 NOTE — Telephone Encounter (Signed)
Faxed CPAP supply orders and sleep studies to [employer].

## 2014-03-09 ENCOUNTER — Encounter: Payer: Self-pay | Admitting: Cardiology

## 2014-03-09 ENCOUNTER — Ambulatory Visit (INDEPENDENT_AMBULATORY_CARE_PROVIDER_SITE_OTHER): Payer: Medicare HMO | Admitting: Cardiology

## 2014-03-09 VITALS — BP 110/82 | HR 63 | Ht 73.0 in | Wt 203.0 lb

## 2014-03-09 DIAGNOSIS — I2510 Atherosclerotic heart disease of native coronary artery without angina pectoris: Secondary | ICD-10-CM

## 2014-03-09 DIAGNOSIS — I10 Essential (primary) hypertension: Secondary | ICD-10-CM

## 2014-03-09 DIAGNOSIS — E785 Hyperlipidemia, unspecified: Secondary | ICD-10-CM

## 2014-03-09 NOTE — Progress Notes (Signed)
Clinical Summary [name] [name] is a 73 y.o.male seen today for follow up of the following medical problems.   1. CAD  - prior 3 vessel CABG July 2013, LVEF around that time by LV gram was 55%  - constant 24 hour discomfort in chest since his surgery, non-exertional. Worst with wearing his seatbelt. Not better with course of NSAIDs after last visit. Otherwise no chest pain.  - no SOB, no DOE. Goes to gym regularly, runs on treadmill, stair stepper etc without troubles.  - compliant with meds   2. Carotid stenosis  - prior right CEA July 2013  - followed by vascular surgery   3. HTN  - does not check regularly  - compliant with meds   4. Hyperlipidemia  - 03/2013: TC TG 108 HDL 41 LDL 72  - compliant with lipitor  5. OSA  - compliant CPAP  Past Medical History  Diagnosis Date  . Hypertension   . Hyperlipidemia   . CAD (coronary artery disease)   . Wears glasses     or contacts  . Deviated septum   . Thyroid nodule   . GERD (gastroesophageal reflux disease)   . Heart attack   . MI (myocardial infarction) 1997  . 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 CAD (coronary artery disease) 12/04/2011    Left Main Disease with 3- vessel CAD   . PONV (postoperative nausea and vomiting)     has in past, no problems after most recent surgery  . Hypothyroidism   . Shortness of breath     with exertion  . Sleep apnea     CPAP, sleep study at [facility]  . 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 S/P carotid endarterectomy 02/29/2012    Right CEA for asymptomatic severe right ICA stenosis   . CHF (congestive heart failure)      No Known Allergies   Current Outpatient Prescriptions  Medication Sig Dispense Refill  . aspirin EC 81 MG tablet Take 81 mg by mouth daily.      Marland Kitchen atorvastatin (LIPITOR) 20 MG tablet Take 1 tablet (20 mg total) by mouth daily at 6 PM.  30 tablet  1  . folic acid  (FOLVITE) 027 MCG tablet Take 400 mcg by mouth daily.      . metoprolol (LOPRESSOR) 50 MG tablet Take 1 tablet (50 mg total) by mouth 2 (two) times daily.  180 tablet  1  . Misc Natural Products (OSTEO BI-FLEX ADV DOUBLE ST) TABS Take 1 tablet by mouth 2 (two) times daily.       . Multiple Vitamin (MULTIVITAMIN WITH MINERALS) TABS Take 1 tablet by mouth daily.      . Omega-3 Fatty Acids (FISH OIL) 1000 MG CPDR Take 1,000 mg by mouth 2 (two) times daily.       Marland Kitchen omeprazole (PRILOSEC) 20 MG capsule Take 20 mg by mouth daily.      . Psyllium (METAMUCIL PO) Take 3 capsules by mouth daily.       . ramipril (ALTACE) 10 MG capsule Take 1 capsule by mouth daily.      Marland Kitchen SYNTHROID 125 MCG tablet Take 1 tablet (125 mcg total) by mouth daily.  30 tablet  6  . zolpidem (AMBIEN) 10 MG tablet Take 10 mg by mouth at bedtime as needed for sleep.  No current facility-administered medications for this visit.     Past Surgical History  Procedure Laterality Date  . Coronary stent placement    . Nasal septum surgery    . Totol thyroidectomy  02/06/2011  . Foot surgery  06/30/11    artificial joint in big toe right foot   . Colonoscopy  15-20years and 12/04/11    Done at [facility]  . Upper gastrointestinal endoscopy  35-40 years ago    [facility]  . Colonoscopy  12/11/2011    Procedure: COLONOSCOPY;  Surgeon: Rogene Houston, MD;  [facility];  Service: Endoscopy;  Laterality: N/A;  830  . Cardiac catheterization      02/22/12  . Adenoidectomy    . Cardiovascular stress test  7/13  . Transesophageal echocardiogram    . Coronary artery bypass graft  02/29/2012    Procedure: CORONARY ARTERY BYPASS GRAFTING (CABG);  Surgeon: Rexene Alberts, MD;  Location: [place];  Service: Open Heart Surgery;  Laterality: N/A;  times three using Left Internal Mammary Artery and Right Greater Saphenous Vein Graft Harvested Endoscopically  . Endarterectomy  02/29/2012    Procedure: ENDARTERECTOMY  CAROTID;  Surgeon: Angelia Mould, MD;  Location: [facility] OR;  Service: Vascular;  Laterality: Right;  . Carotid endarterectomy       No Known Allergies    Family History  Problem Relation Age of Onset  . Hypertension Mother   . Healthy Sister   . Healthy Brother   . Healthy Son   . Colon cancer Neg Hx   . Heart attack Father      Social History Mr. Frisbie reports that he quit smoking about 12 years ago. His smoking use included Cigarettes. He smoked 0.00 packs per day. He has never used smokeless tobacco. Mr. Macqueen reports that he drinks about 1.8 - 2.4 ounces of alcohol per week.   Review of Systems CONSTITUTIONAL: No weight loss, fever, chills, weakness or fatigue.  HEENT: Eyes: No visual loss, blurred vision, double vision or yellow sclerae.No hearing loss, sneezing, congestion, runny nose or sore throat.  SKIN: No rash or itching.  CARDIOVASCULAR: per HPI RESPIRATORY: No shortness of breath, cough or sputum.  GASTROINTESTINAL: No anorexia, nausea, vomiting or diarrhea. No abdominal pain or blood.  GENITOURINARY: No burning on urination, no polyuria NEUROLOGICAL: No headache, dizziness, syncope, paralysis, ataxia, numbness or tingling in the extremities. No change in bowel or bladder control.  MUSCULOSKELETAL: chest wall pain LYMPHATICS: No enlarged nodes. No history of splenectomy.  PSYCHIATRIC: No history of depression or anxiety.  ENDOCRINOLOGIC: No reports of sweating, cold or heat intolerance. No polyuria or polydipsia.  Marland Kitchen   Physical Examination p 63 bp 110/82 Wt 203 lbs BMI 27 Gen: resting comfortably, no acute distress HEENT: no scleral icterus, pupils equal round and reactive, no palptable cervical adenopathy,  CV: RRR, no m/r/g, no JVD, no carotid bruits Resp: Clear to auscultation bilaterally GI: abdomen is soft, non-tender, non-distended, normal bowel sounds, no hepatosplenomegaly MSK: extremities are warm, no edema.  Skin: warm, no rash Neuro:   no focal deficits Psych: appropriate affect   Diagnostic Studies 02/2012 Cath  1. Normal left ventricular function with an ejection fraction of at  least 55% with suggestion of minimal posterobasal inferior  hypocontractility.  2. Extensive coronary calcification involving the left main, left  anterior descending artery, left circumflex, and right coronary  arteries.  3. Probable ostial tapering of the left main to 60-70% with evidence  for  AO pressure dampening by 10-15 mm concordant with ostial  stenosis.  4. 40% proximal circumflex with 70% circumflex marginal 1 stenosis.  5. Total occlusion of the right coronary artery at the ostium with  extensive collateralization to the posterior descending artery  vessel via the left coronary circulation.   03/2013 Echo  LVEF 55-60%, no WMAs, grade I diastolic dysfunction, mild MR   02/2013 Carotid US  Patent right CEA, <40% stenosis on left.      Assessment and Plan  1. CAD  - no current symptoms  - continue risk factor modification and secondary prevention - discussed high dose statin, currently with non-specific neuropathic like pain he is going to discuss with pcp, avoid increasing at this time.  2. Carotid stenosis  - recent US shows no significant disease  - continue follow up with vascular   3. HTN  - at goal, continue current meds   4. Hyperlipidemia  - at goal, continue current meds.   F/u 1 year          Arnoldo Lenis, M.D., F.A.C.C.

## 2014-03-09 NOTE — Patient Instructions (Signed)
Your physician recommends that you schedule a follow-up appointment in: 1 year with Dr Branch You will receive a reminder letter two months in advance reminding you to call and schedule your appointment. If you don't receive this letter, please contact our office.  Your physician recommends that you continue on your current medications as directed. Please refer to the Current Medication list given to you today.   

## 2014-03-10 NOTE — Addendum Note (Signed)
Addended by: Hilarie Fredrickson T on: 03/10/2014 10:48 AM   Modules accepted: Orders

## 2014-03-11 ENCOUNTER — Ambulatory Visit: Payer: Medicare Other | Admitting: Vascular Surgery

## 2014-03-11 ENCOUNTER — Other Ambulatory Visit (HOSPITAL_COMMUNITY): Payer: Medicare Other

## 2014-04-01 ENCOUNTER — Ambulatory Visit: Payer: Self-pay | Admitting: Family

## 2014-04-01 ENCOUNTER — Other Ambulatory Visit (HOSPITAL_COMMUNITY): Payer: Commercial Managed Care - HMO

## 2014-04-14 ENCOUNTER — Encounter: Payer: Self-pay | Admitting: Family

## 2014-04-15 ENCOUNTER — Ambulatory Visit (INDEPENDENT_AMBULATORY_CARE_PROVIDER_SITE_OTHER): Payer: Commercial Managed Care - HMO | Admitting: Family

## 2014-04-15 ENCOUNTER — Ambulatory Visit (HOSPITAL_COMMUNITY)
Admission: RE | Admit: 2014-04-15 | Discharge: 2014-04-15 | Disposition: A | Discharge status: Home / Self Care | Payer: Medicare HMO | Source: Ambulatory Visit | Attending: Family | Admitting: Family

## 2014-04-15 ENCOUNTER — Encounter: Payer: Self-pay | Admitting: Family

## 2014-04-15 VITALS — BP 134/90 | HR 54 | Resp 14 | Ht 74.0 in | Wt 208.0 lb

## 2014-04-15 DIAGNOSIS — I6529 Occlusion and stenosis of unspecified carotid artery: Secondary | ICD-10-CM

## 2014-04-15 DIAGNOSIS — Z48812 Encounter for surgical aftercare following surgery on the circulatory system: Secondary | ICD-10-CM

## 2014-04-15 NOTE — Patient Instructions (Signed)
Stroke Prevention Some medical conditions and behaviors are associated with an increased chance of having a stroke. You may prevent a stroke by making healthy choices and managing medical conditions. HOW CAN I REDUCE MY RISK OF HAVING A STROKE?   Stay physically active. Get at least 30 minutes of activity on most or all days.  Do not smoke. It may also be helpful to avoid exposure to secondhand smoke.  Limit alcohol use. Moderate alcohol use is considered to be:  No more than 2 drinks per day for men.  No more than 1 drink per day for nonpregnant women.  Eat healthy foods. This involves:  Eating 5 or more servings of fruits and vegetables a day.  Making dietary changes that address high blood pressure (hypertension), high cholesterol, diabetes, or obesity.  Manage your cholesterol levels.  Making food choices that are high in fiber and low in saturated fat, trans fat, and cholesterol may control cholesterol levels.  Take any prescribed medicines to control cholesterol as directed by your health care provider.  Manage your diabetes.  Controlling your carbohydrate and sugar intake is recommended to manage diabetes.  Take any prescribed medicines to control diabetes as directed by your health care provider.  Control your hypertension.  Making food choices that are low in salt (sodium), saturated fat, trans fat, and cholesterol is recommended to manage hypertension.  Take any prescribed medicines to control hypertension as directed by your health care provider.  Maintain a healthy weight.  Reducing calorie intake and making food choices that are low in sodium, saturated fat, trans fat, and cholesterol are recommended to manage weight.  Stop drug abuse.  Avoid taking birth control pills.  Talk to your health care provider about the risks of taking birth control pills if you are over 35 years old, smoke, get migraines, or have ever had a blood clot.  Get evaluated for sleep  disorders (sleep apnea).  Talk to your health care provider about getting a sleep evaluation if you snore a lot or have excessive sleepiness.  Take medicines only as directed by your health care provider.  For some people, aspirin or blood thinners (anticoagulants) are helpful in reducing the risk of forming abnormal blood clots that can lead to stroke. If you have the irregular heart rhythm of atrial fibrillation, you should be on a blood thinner unless there is a good reason you cannot take them.  Understand all your medicine instructions.  Make sure that other conditions (such as anemia or atherosclerosis) are addressed. SEEK IMMEDIATE MEDICAL CARE IF:   You have sudden weakness or numbness of the face, arm, or leg, especially on one side of the body.  Your face or eyelid droops to one side.  You have sudden confusion.  You have trouble speaking (aphasia) or understanding.  You have sudden trouble seeing in one or both eyes.  You have sudden trouble walking.  You have dizziness.  You have a loss of balance or coordination.  You have a sudden, severe headache with no known cause.  You have new chest pain or an irregular heartbeat. Any of these symptoms may represent a serious problem that is an emergency. Do not wait to see if the symptoms will go away. Get medical help at once. Call your local emergency services (911 in [place] Do not drive yourself to the hospital. Document Released: 09/07/2004 Document Revised: 12/15/2013 Document Reviewed: 01/31/2013 [employer] Patient Information 2015 [employer]. This information is not intended to replace advice given   to you by your health care provider. Make sure you discuss any questions you have with your health care provider.  

## 2014-04-15 NOTE — Progress Notes (Signed)
Established Carotid Patient   History of Present Illness  [name] [name] is a 73 y.o. male patient of Dr. Scot Dock who was found have a greater than 80% right carotid stenosis. Preoperative cardiac evaluation showed evidence of significant coronary disease. He underwent combined right carotid endarterectomy and coronary revascularization on 02/29/2012. He comes in for a yearly follow up visit. He complains of some paresthesias in his tongue which she has had for a year. He said no focal weakness otherwise in no history of TIAs, stroke, expressive or receptive aphasia, or amaurosis fugax. He states he has been checked for vitamin B deficiency and he is not deficient. He denies claudication symptoms with walking.  Patient has Negative history of TIA or stroke symptom.  The patient denies amaurosis fugax or monocular blindness.  The patient  denies facial drooping.  Pt. denies hemiplegia.  The patient denies receptive or expressive aphasia.  Pt. denies extremity weakness.  Pt denies New Medical or Surgical History.  Pt Diabetic: No Pt smoker: former smoker, quit in the 1980's, his wife smokes outside the house. He is usually physically active.  Pt meds include: Statin : Yes ASA: Yes Other anticoagulants/antiplatelets: no   Past Medical History  Diagnosis Date  . Hypertension   . Hyperlipidemia   . CAD (coronary artery disease)   . Wears glasses     or contacts  . Deviated septum   . Thyroid nodule   . GERD (gastroesophageal reflux disease)   . Heart attack   . MI (myocardial infarction) 1997  . 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 CAD (coronary artery disease) 12/04/2011    Left Main Disease with 3- vessel CAD   . PONV (postoperative nausea and vomiting)     has in past, no problems after most recent surgery  . Hypothyroidism   . Shortness of breath     with exertion  . Sleep apnea     CPAP, sleep study at [facility]  . 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 S/P carotid endarterectomy 02/29/2012    Right CEA for asymptomatic severe right ICA stenosis   . CHF (congestive heart failure)     Social History History  Substance Use Topics  . Smoking status: Former Smoker    Types: Cigarettes    Quit date: 09/04/2001  . Smokeless tobacco: Never Used     Comment: smoked 32yrs, smoked 1/2 pack a day  . Alcohol Use: 1.8 - 2.4 oz/week    3-4 Cans of beer per week     Comment: social- beer , not on regular basis    Family History Family History  Problem Relation Age of Onset  . Hypertension Mother   . Healthy Sister   . Healthy Brother   . Healthy Son   . Colon cancer Neg Hx   . Heart attack Father   . Heart disease Father     Before age 64  . Hyperlipidemia Father   . Hypertension Father     Surgical History Past Surgical History  Procedure Laterality Date  . Coronary stent placement    . Nasal septum surgery    . Totol thyroidectomy  02/06/2011  . Foot surgery  06/30/11    artificial joint in big toe right foot   . Colonoscopy  15-20years and 12/04/11    Done at [facility]  .  Upper gastrointestinal endoscopy  35-40 years ago    [facility]  . Colonoscopy  12/11/2011    Procedure: COLONOSCOPY;  Surgeon: Rogene Houston, MD;  Location: [facility];  Service: Endoscopy;  Laterality: N/A;  830  . Cardiac catheterization      02/22/12  . Adenoidectomy    . Cardiovascular stress test  7/13  . Transesophageal echocardiogram    . Coronary artery bypass graft  02/29/2012    Procedure: CORONARY ARTERY BYPASS GRAFTING (CABG);  Surgeon: Rexene Alberts, MD;  Location: [place];  Service: Open Heart Surgery;  Laterality: N/A;  times three using Left Internal Mammary Artery and Right Greater Saphenous Vein Graft Harvested Endoscopically  . Endarterectomy  02/29/2012    Procedure: ENDARTERECTOMY CAROTID;  Surgeon: Angelia Mould, MD;  Location: [facility];   Service: Vascular;  Laterality: Right;  . Carotid endarterectomy      No Known Allergies  Current Outpatient Prescriptions  Medication Sig Dispense Refill  . aspirin EC 81 MG tablet Take 81 mg by mouth daily.      Marland Kitchen atorvastatin (LIPITOR) 20 MG tablet Take 1 tablet (20 mg total) by mouth daily at 6 PM.  30 tablet  1  . folic acid (FOLVITE) 378 MCG tablet Take 400 mcg by mouth daily.      Marland Kitchen levothyroxine (SYNTHROID) 125 MCG tablet Take 100 mcg by mouth daily.      . metoprolol (LOPRESSOR) 50 MG tablet Take 1 tablet (50 mg total) by mouth 2 (two) times daily.  180 tablet  1  . Misc Natural Products (OSTEO BI-FLEX ADV DOUBLE ST) TABS Take 1 tablet by mouth 2 (two) times daily.       . Multiple Vitamin (MULTIVITAMIN WITH MINERALS) TABS Take 1 tablet by mouth daily.      . Multiple Vitamins-Minerals (PRESERVISION AREDS 2 PO) Take by mouth.      . Omega-3 Fatty Acids (FISH OIL) 1000 MG CPDR Take 1,000 mg by mouth 2 (two) times daily.       Marland Kitchen omeprazole (PRILOSEC) 20 MG capsule Take 20 mg by mouth daily.      . Psyllium (METAMUCIL PO) Take 3 capsules by mouth daily.       . ramipril (ALTACE) 10 MG capsule Take 1 capsule by mouth daily.      Marland Kitchen zolpidem (AMBIEN) 10 MG tablet Take 10 mg by mouth at bedtime as needed for sleep.       No current facility-administered medications for this visit.    Review of Systems : See HPI for pertinent positives and negatives.  Physical Examination  Filed Vitals:   04/15/14 1420 04/15/14 1422  BP: 150/96 134/90  Pulse: 59 54  Resp:  14  Height:  6\' 2"  (1.88 m)  Weight:  208 lb (94.348 kg)  SpO2:  94%   Body mass index is 26.69 kg/(m^2).  General: WDWN male in NAD GAIT: normal Eyes: PERRLA Pulmonary:  Non-labored, CTAB, Negative  Rales, Negative rhonchi, & Negative wheezing.  Cardiac: regular Rhythm ,  Negative detected murmur.  VASCULAR EXAM Carotid Bruits Right Left   Negative Negative    Radial pulses are 2+ palpable and equal.  LE Pulses Right Left       POPLITEAL  not palpable   not palpable       POSTERIOR TIBIAL  2+ palpable   2+ palpable        DORSALIS PEDIS      ANTERIOR TIBIAL not palpable  not palpable     Gastrointestinal: soft, nontender, BS WNL, no r/g,  negative masses.  Musculoskeletal: Negative muscle atrophy/wasting. M/S 5/5 throughout, Extremities without ischemic changes.  Neurologic: A&O X 3; Appropriate Affect ; SENSATION ;normal;  Speech is normal CN 2-12 intact, Pain and light touch intact in extremities, Motor exam as listed above.   Non-Invasive Vascular Imaging CAROTID DUPLEX 04/15/2014   CEREBROVASCULAR DUPLEX EVALUATION    INDICATION: Follow-up carotid endarterectomy     PREVIOUS INTERVENTION(S): Right carotid endarterectomy 02/29/2012    DUPLEX EXAM:     RIGHT  LEFT  Peak Systolic Velocities (cm/s) End Diastolic Velocities (cm/s) Plaque LOCATION Peak Systolic Velocities (cm/s) End Diastolic Velocities (cm/s) Plaque  136 34  CCA PROXIMAL 62 19   90 20  CCA MID 59 20   89 24  CCA DISTAL 60 21 HT  96 17  ECA 41 9 HT  58 19  ICA PROXIMAL 110 24 HT/CP  77 31  ICA MID 82 22   60 19  ICA DISTAL 72 24     NA ICA / CCA Ratio (PSV) 1.8  Antegrade  Vertebral Flow Not visualized    Brachial Systolic Pressure (mmHg)   Within normal limits  Brachial Artery Waveforms Within normal limits     Plaque Morphology:  HM = Homogeneous, HT = Heterogeneous, CP = Calcific Plaque, SP = Smooth Plaque, IP = Irregular Plaque     ADDITIONAL FINDINGS:     IMPRESSION: 1. Widely patent right carotid endarterectomy without evidence of restenosis or hyperplasia.  2. Evidence of mild (<40%) stenosis of the left internal carotid artery. 3. Right vertebral artery is antegrade; left vertebral artery could not be visualized. 4. Left external carotid artery could not be visualized.     Compared to the previous exam:  No significant change compared to prior exam.              Assessment: [name] [name] is a 73 y.o. male who presents with asymptomatic widely patent right carotid endarterectomy and minimal left ICA stenosis. The  ICA stenosis is  Unchanged from previous exam.  Plan: Follow-up in 1 year with Carotid Duplex scan.   I discussed in depth with the patient the nature of atherosclerosis, and emphasized the importance of maximal medical management including strict control of blood pressure, blood glucose, and lipid levels, obtaining regular exercise, and continued cessation of smoking.  The patient is aware that without maximal medical management the underlying atherosclerotic disease process will progress, limiting the benefit of any interventions. The patient was given information about stroke prevention and what symptoms should prompt the patient to seek immediate medical care. Thank you for allowing [place] to participate in this patient's care.  Clemon Chambers, RN, MSN, FNP-C [facility] Office: [phone]  Clinic Physician: Scot Dock  04/15/2014 2:35 PM

## 2014-04-15 NOTE — Addendum Note (Signed)
Addended by: Mena Goes on: 04/15/2014 03:33 PM   Modules accepted: Orders

## 2014-06-04 ENCOUNTER — Telehealth: Payer: Self-pay | Admitting: *Deleted

## 2014-06-04 NOTE — Telephone Encounter (Signed)
Received labs in Dr. Nelly Laurence folder

## 2014-06-05 ENCOUNTER — Encounter: Payer: Self-pay | Admitting: Cardiology

## 2014-07-23 ENCOUNTER — Encounter (HOSPITAL_COMMUNITY): Payer: Self-pay | Admitting: Cardiovascular Disease

## 2014-09-11 DIAGNOSIS — I10 Essential (primary) hypertension: Secondary | ICD-10-CM | POA: Diagnosis not present

## 2014-09-11 DIAGNOSIS — R7301 Impaired fasting glucose: Secondary | ICD-10-CM | POA: Diagnosis not present

## 2014-09-16 DIAGNOSIS — I10 Essential (primary) hypertension: Secondary | ICD-10-CM | POA: Diagnosis not present

## 2014-09-16 DIAGNOSIS — R7301 Impaired fasting glucose: Secondary | ICD-10-CM | POA: Diagnosis not present

## 2014-09-16 DIAGNOSIS — G629 Polyneuropathy, unspecified: Secondary | ICD-10-CM | POA: Diagnosis not present

## 2014-09-16 DIAGNOSIS — G4733 Obstructive sleep apnea (adult) (pediatric): Secondary | ICD-10-CM | POA: Diagnosis not present

## 2014-09-22 ENCOUNTER — Encounter: Payer: Self-pay | Admitting: Neurology

## 2014-09-23 ENCOUNTER — Encounter: Payer: Self-pay | Admitting: Neurology

## 2014-09-24 ENCOUNTER — Encounter: Payer: Self-pay | Admitting: Neurology

## 2014-09-24 ENCOUNTER — Ambulatory Visit (INDEPENDENT_AMBULATORY_CARE_PROVIDER_SITE_OTHER): Payer: Commercial Managed Care - HMO | Admitting: Neurology

## 2014-09-24 VITALS — BP 137/81 | HR 62 | Temp 97.2°F | Ht 72.0 in | Wt 212.0 lb

## 2014-09-24 DIAGNOSIS — G629 Polyneuropathy, unspecified: Secondary | ICD-10-CM

## 2014-09-24 DIAGNOSIS — G589 Mononeuropathy, unspecified: Secondary | ICD-10-CM | POA: Diagnosis not present

## 2014-09-24 NOTE — Progress Notes (Signed)
Subjective:    Patient ID: [name] [name] is a 74 y.o. male.  HPI     Star Age, MD, PhD [facility] [address], [address] [place], [place] [address]  Dear Dr. Nevada Crane,   I saw your patient, [name] [name], upon your kind request in the neurologic clinic today for initial consultation of his neuralgia. The patient is unaccompanied today. As you know, [name] [name] is a 74 year old right-handed gentleman with an underlying medical history of high-grade right carotid artery stenosis and coronary artery disease, status post MI, status post CABG 3 vessel combined with right carotid endarterectomy on 02/29/2012, and congestive heart failure, followed by cardiology, hypertension, hyperlipidemia, reflux disease, hypothyroidism (s/p thyroidectomy), obstructive sleep apnea on CPAP therapy,  and pre-diabetes, who reports having had open heart surgery 02/2012 and since then has had tingling in both feet and burning sensation, progressive over time. It started in the feet and now seems to have traveled up to the thighs. No weakness, no atrophy, no twitching and symptoms fluctuate as i are milder in the morning when he has some tingling but he may have frank burning pain by the end of the day. He has intermittent tingling in his fingers as well. He broke his right the toe and had surgery on it. His toes stay cold. He also has some intermittent abnormal sensation and metallic taste since his heart surgery. This improved. He has soreness around his scar from the open heart surgery. It bothers him to wear her seatbelt as it causes him to feel very sore when the seatbelt rubs. For symptomatic treatment of his nerve related pain he was tried on Lyrica 50 mg each night for about a month but it did not help and he had insomnia from it. His cardiologist suggested ibuprofen 800 mg twice daily for a few days for the chest wall pain but he did not think it helped and he did not try it  consistently. He is compliant with his CPAP machine. His latest hemoglobin A1c was 5.9. He had his thyroid function tested and sees her endocrinologist. He has no family history of neuropathy and no strong family history of diabetes. He quit smoking in 95. He drinks 2-3 cups of coffee per day and has reduced his soda intake. He drinks beer occasionally. He used to work in a burglary but has never been a heavy drinker and never drank liquor. His balance has not been as good for the past couple of years. Thankfully he has not fallen. He stays active and works around the house.   His Past Medical History Is Significant For: Past Medical History  Diagnosis Date  . Hypertension   . Hyperlipidemia   . Wears glasses     or contacts  . Deviated septum   . Thyroid nodule   . GERD (gastroesophageal reflux disease)   . Heart attack   . MI (myocardial infarction) 1997  . 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 PONV (postoperative nausea and vomiting)     has in past, no problems after most recent surgery  . Hypothyroidism   . Shortness of breath     with exertion  . Sleep apnea     CPAP, sleep study at [facility]  . 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 S/P carotid endarterectomy 02/29/2012    Right  CEA for asymptomatic severe right ICA stenosis   . CHF (congestive heart failure)   . CAD (coronary artery disease)   . CAD (coronary artery disease) 12/04/2011    Left Main Disease with 3- vessel CAD   . CAD (coronary artery disease)     seeing Dr Harl Bowie  . Dyslipidemia     His Past Surgical History Is Significant For: Past Surgical History  Procedure Laterality Date  . Coronary stent placement    . Nasal septum surgery    . Totol thyroidectomy  02/06/2011  . Foot surgery  06/30/11    artificial joint in big toe right foot   . Colonoscopy  15-20years and 12/04/11    Done at [facility]  . Upper  gastrointestinal endoscopy  35-40 years ago    [facility]  . Colonoscopy  12/11/2011    Procedure: COLONOSCOPY;  Surgeon: Rogene Houston, MD;  Location: [facility];  Service: Endoscopy;  Laterality: N/A;  830  . Cardiac catheterization      02/22/12  . Adenoidectomy    . Cardiovascular stress test  7/13  . Transesophageal echocardiogram    . Coronary artery bypass graft  02/29/2012    Procedure: CORONARY ARTERY BYPASS GRAFTING (CABG);  Surgeon: Rexene Alberts, MD;  Location: [facility];  Service: Open Heart Surgery;  Laterality: N/A;  times three using Left Internal Mammary Artery and Right Greater Saphenous Vein Graft Harvested Endoscopically  . Endarterectomy  02/29/2012    Procedure: ENDARTERECTOMY CAROTID;  Surgeon: Angelia Mould, MD;  Location: [facility] OR;  Service: Vascular;  Laterality: Right;  . Carotid endarterectomy    . Left heart catheterization with coronary angiogram N/A 02/20/2012    Procedure: LEFT HEART CATHETERIZATION WITH CORONARY ANGIOGRAM;  Surgeon: Troy Sine, MD;  Location: [facility] CATH LAB;  Service: Cardiovascular;  Laterality: N/A;    His Family History Is Significant For: Family History  Problem Relation Age of Onset  . Hypertension Mother   . Healthy Sister   . Healthy Brother   . Healthy Son   . Colon cancer Neg Hx   . Heart attack Father   . Heart disease Father     Before age 68  . Hyperlipidemia Father   . Hypertension Father   . Prostate cancer Father     His Social History Is Significant For: History   Social History  . Marital Status: Married    Spouse Name: Velva Harman  . Number of Children: 1  . Years of Education: 12   Occupational History  .      retired   Social History Main Topics  . Smoking status: Former Smoker    Types: Cigarettes    Quit date: 09/04/2001  . Smokeless tobacco: Never Used     Comment: smoked 60yrs, smoked 1/2 pack a day  . Alcohol Use: 1.8 - 2.4 oz/week    3-4 Cans of beer per week     Comment: social- beer ,  not on regular basis  . Drug Use: No  . Sexual Activity: Not on file   Other Topics Concern  . None   Social History Narrative   Consumes 2-3 cups of caffeine daily    His Allergies Are:  No Known Allergies:   His Current Medications Are:  Outpatient Encounter Prescriptions as of 09/24/2014  Medication Sig  . aspirin EC 81 MG tablet Take 81 mg by mouth daily.  Marland Kitchen atorvastatin (LIPITOR) 20 MG tablet Take 1 tablet (20 mg  total) by mouth daily at 6 PM.  . Esomeprazole Magnesium (NEXIUM PO) OTC  . folic acid (FOLVITE) 539 MCG tablet Take 400 mcg by mouth daily.  . metoprolol (LOPRESSOR) 50 MG tablet Take 1 tablet (50 mg total) by mouth 2 (two) times daily.  . Misc Natural Products (OSTEO BI-FLEX ADV DOUBLE ST) TABS Take 1 tablet by mouth 2 (two) times daily.   . Multiple Vitamin (MULTIVITAMIN WITH MINERALS) TABS Take 1 tablet by mouth daily.  . Multiple Vitamins-Minerals (PRESERVISION AREDS 2 PO) Take by mouth.  . Omega-3 Fatty Acids (FISH OIL) 1000 MG CPDR Take 1,000 mg by mouth 2 (two) times daily.   Marland Kitchen omeprazole (PRILOSEC) 20 MG capsule Take 20 mg by mouth daily.  . Psyllium (METAMUCIL PO) Take 3 capsules by mouth daily.   . ramipril (ALTACE) 10 MG capsule Take 1 capsule by mouth daily.  Marland Kitchen SYNTHROID 100 MCG tablet   . zolpidem (AMBIEN) 10 MG tablet Take 10 mg by mouth at bedtime as needed for sleep.  . [DISCONTINUED] levothyroxine (SYNTHROID) 125 MCG tablet Take 100 mcg by mouth daily. One tablet by mouth each morning  :   Review of Systems:  Out of a complete 14 point review of systems, all are reviewed and negative with the exception of these symptoms as listed below:   Review of Systems  Musculoskeletal:       Joint pain , aching muscles  Allergic/Immunologic:       Skin sensitivity  Neurological: Positive for dizziness.       Insomnia, sleepiness  Psychiatric/Behavioral:       Not enough sleep    Objective:  Neurologic Exam  Physical Exam Physical Examination:    Filed Vitals:   09/24/14 0907  BP: 137/81  Pulse: 62  Temp: 97.2 F (36.2 C)    General Examination: The patient is a very pleasant 74 y.o. male in no acute distress. He appears well-developed and well-nourished and well groomed.   HEENT: Normocephalic, atraumatic, pupils are equal, round and reactive to light and accommodation. Funduscopic exam is normal with sharp disc margins noted. Extraocular tracking is good without limitation to gaze excursion or nystagmus noted. Normal smooth pursuit is noted. Hearing is grossly intact. Tympanic membranes are clear bilaterally. Face is symmetric with normal facial animation and normal facial sensation. Speech is clear with no dysarthria noted. There is no hypophonia. There is no lip, neck/head, jaw or voice tremor. Neck is supple with full range of passive and active motion. There are no carotid bruits on auscultation. Oropharynx exam reveals: moderate mouth dryness, adequate dental hygiene and mild airway crowding. Mallampati is class II. Tongue protrudes centrally and palate elevates symmetrically.   Chest: Clear to auscultation without wheezing, rhonchi or crackles noted. His sternotomy scar is unremarkable. He's not particularly tender around it at this time.   Heart: S1+S2+0, regular and normal without murmurs, rubs or gallops noted.   Abdomen: Soft, non-tender and non-distended with normal bowel sounds appreciated on auscultation.  Extremities: There is no pitting edema in the distal lower extremities bilaterally. Pedal pulses are somewhat difficult to palpate.   Skin: Warm and dry without trophic changes noted. There are no varicose veins. toes are mildly cold.   Musculoskeletal: exam reveals no obvious joint deformities, tenderness or joint swelling or erythema except for scar over his right big toe.   Neurologically:  Mental status: The patient is awake, alert and oriented in all 4 spheres. His immediate and remote memory, attention,  language skills and fund of knowledge are appropriate. There is no evidence of aphasia, agnosia, apraxia or anomia. Speech is clear with normal prosody and enunciation. Thought process is linear. Mood is normal and affect is normal.  Cranial nerves II - XII are as described above under HEENT exam. In addition: shoulder shrug is normal with equal shoulder height noted. Motor exam: Normal bulk, strength and tone is noted. There is no drift, tremor or rebound. Romberg is negative. Reflexes are 2+ throughout. Babinski: Toes are flexor bilaterally. Fine motor skills and coordination: intact with normal finger taps, normal hand movements, normal rapid alternating patting, normal foot taps and normal foot agility.  Cerebellar testing: No dysmetria or intention tremor on finger to nose testing. Heel to shin is unremarkable bilaterally. There is no truncal or gait ataxia.  Sensory exam: intact to light touch, pinprick, vibration, temperature sense and proprioception in the upper and lower extremities.  Gait, station and balance: He stands easily. No veering to one side is noted. No leaning to one side is noted. Posture is age-appropriate and stance is narrow based. Gait shows normal stride length and normal pace. No problems turning are noted. He turns en bloc. Tandem walk is  slightly difficult for him.                Assessment and Plan:    In summary, [name] [name] is a very pleasant 74 y.o.-year old gentleman with an underlying medical history of high-grade right carotid artery stenosis and coronary artery disease, status post MI, status post CABG 3 vessel combined with right carotid endarterectomy on 02/29/2012, and congestive heart failure, followed by cardiology, hypertension, hyperlipidemia, reflux disease, hypothyroidism (s/p thyroidectomy), obstructive sleep apnea on CPAP therapy,  and pre-diabetes, who reports having had open heart surgery 02/2012 and since then has had tingling in both feet and  burning sensation, progressive over time. It started in the feet and now seems to have traveled up to the thighs. His neurological exam is nonfocal at this time. I reassured the patient in that regard. Nevertheless, he describes symptoms of neuropathy. I talked to the patient at length about the diagnosis of neuropathy and possible etiologies. Also explained to him that sometimes we do not find a reason for nerve damage. I would like to proceed with further workup in the form of additional blood work and EMG nerve conduction testing. I explained to him that there is no curative treatment for most neuropathies. Symptomatic treatment is entertained sometimes when there is discomfort on pain such as paresthesias or burning sensations. We talked about trying low-dose gabapentin. We will pick up our discussion next time. We will call him with his test results as we receive them. I will see him back in a couple months. I answered all his questions today and the patient was in agreement.  Thank you very much for allowing me to participate in the care of this nice patient. If I can be of any further assistance to you please do not hesitate to call me at [phone].  Sincerely,   Star Age, MD, PhD

## 2014-09-24 NOTE — Patient Instructions (Addendum)
You may have a condition called peripheral neuropathy, i. e. nerve damage. There is no specific treatment for most neuropathies. The most common cause for neuropathy is diabetes in this country, in which case, tight glucose control is key. Other causes include thyroid disease, and some vitamin deficiencies. Certain medications such as chemotherapy agents and other chemicals or toxins including alcohol can cause neuropathy. There are some genetic conditions or hereditary neuropathies. Typically patients will report a family history of neuropathy in those conditions. There are cases associated with cancers and autoimmune conditions. Most neuropathies are progressive unless a root cause can be found and treated. For most neuropathies there is no actual cure or reversing of symptoms. Painful neuropathy can be difficult to treat symptomatically, but there are some medications available to ease the symptoms. Electrophysiologic testing with nerve conduction velocity studies and EMG (muscle testing) do not always pick up neuropathies that affect the smallest fibers. Other common tests include different type of blood work, and rarely, spinal fluid testing, and sometimes we resort to asking for a nerve and muscle biopsy.  I would like to investigate things further to look for evidence of neuropathy or nerve damage; therefore, I would like to do some blood work (today), and electrical testing of your muscles and nerves, which is known as EMG/NCV (will be scheduled).  Drink more water!  Stay active physically and mentally.   We may consider something low dose, such as gabapentin for symptomatic relief.   I will see you back in 2 months.

## 2014-09-28 DIAGNOSIS — H6123 Impacted cerumen, bilateral: Secondary | ICD-10-CM | POA: Diagnosis not present

## 2014-09-28 DIAGNOSIS — H6992 Unspecified Eustachian tube disorder, left ear: Secondary | ICD-10-CM | POA: Diagnosis not present

## 2014-09-28 LAB — COMPREHENSIVE METABOLIC PANEL
ALBUMIN: 4.7 g/dL (ref 3.5–4.8)
ALT: 17 IU/L (ref 0–44)
AST: 18 IU/L (ref 0–40)
Albumin/Globulin Ratio: 2 (ref 1.1–2.5)
Alkaline Phosphatase: 92 IU/L (ref 39–117)
BILIRUBIN TOTAL: 0.5 mg/dL (ref 0.0–1.2)
BUN / CREAT RATIO: 10 (ref 10–22)
BUN: 13 mg/dL (ref 8–27)
CALCIUM: 9.6 mg/dL (ref 8.6–10.2)
CO2: 26 mmol/L (ref 18–29)
CREATININE: 1.25 mg/dL (ref 0.76–1.27)
Chloride: 99 mmol/L (ref 97–108)
GFR calc Af Amer: 67 mL/min/{1.73_m2} (ref >59)
GFR, EST NON AFRICAN AMERICAN: 58 mL/min/{1.73_m2} — AB (ref >59)
Globulin, Total: 2.3 g/dL (ref 1.5–4.5)
Glucose: 105 mg/dL — ABNORMAL HIGH (ref 65–99)
Potassium: 4.6 mmol/L (ref 3.5–5.2)
SODIUM: 138 mmol/L (ref 134–144)
Total Protein: 7 g/dL (ref 6.0–8.5)

## 2014-09-28 LAB — VITAMIN B6: VITAMIN B6: 32.9 ug/L (ref 5.3–46.7)

## 2014-09-28 LAB — COPPER, SERUM: Copper: 91 ug/dL (ref 72–166)

## 2014-09-28 LAB — IFE AND PE, SERUM
ALBUMIN SERPL ELPH-MCNC: 4.1 g/dL (ref 3.2–5.6)
ALPHA2 GLOB SERPL ELPH-MCNC: 0.7 g/dL (ref 0.4–1.2)
Albumin/Glob SerPl: 1.5 (ref 0.7–2.0)
Alpha 1: 0.2 g/dL (ref 0.1–0.4)
B-GLOBULIN SERPL ELPH-MCNC: 0.9 g/dL (ref 0.6–1.3)
GAMMA GLOB SERPL ELPH-MCNC: 1.2 g/dL (ref 0.5–1.6)
GLOBULIN, TOTAL: 2.9 g/dL (ref 2.0–4.5)
IGA/IMMUNOGLOBULIN A, SERUM: 299 mg/dL (ref 91–414)
IGM (IMMUNOGLOBULIN M), SRM: 93 mg/dL (ref 40–230)
IgG (Immunoglobin G), Serum: 1115 mg/dL (ref 700–1600)

## 2014-09-28 LAB — ANA W/REFLEX: ANA: NEGATIVE

## 2014-09-28 LAB — SEDIMENTATION RATE: SED RATE: 2 mm/h (ref 0–30)

## 2014-09-28 LAB — VITAMIN B1, WHOLE BLOOD: THIAMINE: 197.8 nmol/L (ref 66.5–200.0)

## 2014-09-28 LAB — RHEUMATOID FACTOR: Rhuematoid fact SerPl-aCnc: <7 IU/mL (ref 0.0–13.9)

## 2014-09-28 LAB — B12 AND FOLATE PANEL: Folate: >20 ng/mL (ref >3.0)

## 2014-09-28 LAB — HEAVY METALS PROFILE II, BLOOD
ARSENIC: 2 ug/L (ref 2–23)
Cadmium: NOT DETECTED ug/L (ref 0.0–1.2)
LEAD, BLOOD: NOT DETECTED ug/dL (ref 0–19)
Mercury: NOT DETECTED ug/L (ref 0.0–14.9)

## 2014-09-28 LAB — C-REACTIVE PROTEIN: CRP: 0.3 mg/L (ref 0.0–4.9)

## 2014-09-28 LAB — HTLV-I/II ANTIBODIES, QUAL.: HTLV I/II Ab: NEGATIVE

## 2014-09-29 ENCOUNTER — Telehealth: Payer: Self-pay | Admitting: Neurology

## 2014-09-29 NOTE — Progress Notes (Signed)
Quick Note:  pls call pt: labs are all fine, with the exception of elevated B12 level, indicating that he may be taking additional vitamin B12. Please have him discuss with his primary care physician further advise regarding B12 supplementation but I would recommend that he stop any vitamin B supplements at this time. Star Age, MD, PhD [facility] ([facility]  ______

## 2014-09-29 NOTE — Telephone Encounter (Signed)
Patient returning someone's call regarding blood work results.  Please return call to cell # [phone].

## 2014-09-29 NOTE — Telephone Encounter (Signed)
Shared blood work results with patient, he verbalized understanding.

## 2014-10-05 ENCOUNTER — Ambulatory Visit (INDEPENDENT_AMBULATORY_CARE_PROVIDER_SITE_OTHER): Payer: Commercial Managed Care - HMO | Admitting: Neurology

## 2014-10-05 ENCOUNTER — Ambulatory Visit (INDEPENDENT_AMBULATORY_CARE_PROVIDER_SITE_OTHER): Payer: Self-pay | Admitting: Neurology

## 2014-10-05 DIAGNOSIS — G629 Polyneuropathy, unspecified: Secondary | ICD-10-CM | POA: Diagnosis not present

## 2014-10-05 DIAGNOSIS — R202 Paresthesia of skin: Secondary | ICD-10-CM

## 2014-10-05 NOTE — Procedures (Signed)
  [facility]    Provider:  Dr Jaynee Eagles Referring Provider: Star Age MD Primary Care Physician:  Delphina Cahill, MD  HPI:  [name] [name] is a lovely 74 y.o. male who is here as a referral for numbness and paresthesias. His symptoms starts about 2.5 years ago after a CABG. He feels symptoms are worse in the lower extremities. He endorses tingling and burning in the feet and the legs. He reports the symptoms have progressed to the arms and are also present in the chest (points to the manubrium area). Symptoms are symmetric.   Summary  Nerve conduction studies were performed on the right upper and bilateral lower extremities:  The right Median motor nerve showed normal conductions with normal F Wave latency The right Ulnar motor nerve showed normal conductions with normal F Wave latency The bilateral Peroneal motor nerves showed normal conductions with normal F Wave latencies The bilateral Tibial motor nerves showed normal conductions with normal F Wave latencies The right second-digit Median sensory nerve conduction was within normal limits The right fifth-digit Ulnar sensory nerve conduction was within normal limits The bilateral Sural sensory nerve conductions were within normal limits Bilateral H Reflexes showed normal latencies  EMG Needle study was performed on selected left lower extremity muscles:   The Vastus Medialis, Anterior Tibialis, Medial Gastrocnemius, Extensor Hallucis Longus and Abductor Hallucis Brevis muscles were within normal limits.  Conclusion: This is a normal study.  However a small-fiber neuropathy could be responsible for such symptoms and still evade detection by this study. Clinical correlation recommended. May consider skin biopsy for epidermal nerve fiber density testing  as clinically warranted.   Sarina Ill, MD  [facility] [address] [place], [place] [address]  Phone [phone] Fax  [phone]

## 2014-10-05 NOTE — Progress Notes (Signed)
  [facility]    Provider:  Dr Jaynee Eagles Referring Provider: Star Age MD Primary Care Physician:  Delphina Cahill, MD  HPI:  [name] [name] is a lovely 74 y.o. male who is here as a referral for numbness and paresthesias. His symptoms starts about 2.5 years ago after a CABG. He feels symptoms are worse in the lower extremities. He endorses tingling and burning in the feet and the legs. He reports the symptoms have progressed to the arms and are also present in the chest (points to the manubrium area). Symptoms are symmetric.   Summary  Nerve conduction studies were performed on the right upper and bilateral lower extremities:  The right Median motor nerve showed normal conductions with normal F Wave latency The right Ulnar motor nerve showed normal conductions with normal F Wave latency The bilateral Peroneal motor nerves showed normal conductions with normal F Wave latencies The bilateral Tibial motor nerves showed normal conductions with normal F Wave latencies The right second-digit Median sensory nerve conduction was within normal limits The right fifth-digit Ulnar sensory nerve conduction was within normal limits The bilateral Sural sensory nerve conductions were within normal limits Bilateral H Reflexes showed normal latencies  EMG Needle study was performed on selected left lower extremity muscles:   The Vastus Medialis, Anterior Tibialis, Medial Gastrocnemius, Extensor Hallucis Longus and Abductor Hallucis Brevis muscles were within normal limits.  Conclusion: This is a normal study.  However a small-fiber neuropathy could be responsible for such symptoms and still evade detection by this study. Clinical correlation recommended. May consider skin biopsy for epidermal nerve fiber density testing  as clinically warranted.   Sarina Ill, MD  [facility] [address] [place],  [address]  Phone [phone] Fax  [phone]

## 2014-10-06 ENCOUNTER — Telehealth: Payer: Self-pay | Admitting: Neurology

## 2014-10-06 NOTE — Telephone Encounter (Signed)
Patient called back, relayed Dr. Rexene Alberts message and patient verbalizes understanding.

## 2014-10-06 NOTE — Telephone Encounter (Signed)
Called and left Vm message for return call back to share EMG results with patient

## 2014-10-06 NOTE — Telephone Encounter (Signed)
Please call patient, his recent EMG nerve conduction test done by Dr. Jaynee Eagles was reported as normal. Sometimes though a very early neuropathy or small fiber neuropathy affecting just the smallest fibers can be missed with this type of testing and sometimes we repeat this down the road. Please notify patient. No other action is required on this test at this time.

## 2014-10-07 NOTE — Progress Notes (Signed)
See procedure note.

## 2014-11-14 DIAGNOSIS — L570 Actinic keratosis: Secondary | ICD-10-CM | POA: Diagnosis not present

## 2014-11-14 DIAGNOSIS — L858 Other specified epidermal thickening: Secondary | ICD-10-CM | POA: Diagnosis not present

## 2014-11-23 ENCOUNTER — Ambulatory Visit (INDEPENDENT_AMBULATORY_CARE_PROVIDER_SITE_OTHER): Payer: Commercial Managed Care - HMO | Admitting: Neurology

## 2014-11-23 ENCOUNTER — Encounter: Payer: Self-pay | Admitting: Neurology

## 2014-11-23 VITALS — BP 138/78 | HR 88 | Resp 18 | Ht 73.0 in | Wt 208.0 lb

## 2014-11-23 DIAGNOSIS — G2581 Restless legs syndrome: Secondary | ICD-10-CM | POA: Diagnosis not present

## 2014-11-23 DIAGNOSIS — G4761 Periodic limb movement disorder: Secondary | ICD-10-CM

## 2014-11-23 DIAGNOSIS — R202 Paresthesia of skin: Secondary | ICD-10-CM | POA: Diagnosis not present

## 2014-11-23 DIAGNOSIS — G479 Sleep disorder, unspecified: Secondary | ICD-10-CM

## 2014-11-23 DIAGNOSIS — G4733 Obstructive sleep apnea (adult) (pediatric): Secondary | ICD-10-CM

## 2014-11-23 DIAGNOSIS — Z9989 Dependence on other enabling machines and devices: Secondary | ICD-10-CM

## 2014-11-23 NOTE — Patient Instructions (Addendum)
We will do another sleep study to re-evaluate your sleep as far as sleep apnea and leg twitching at night.  You may have symptoms of restless legs syndrome.

## 2014-11-23 NOTE — Progress Notes (Signed)
Subjective:    Patient ID: [name] [name] is a 74 y.o. male.  HPI     Interim history:   Mr. 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 The patient is unaccompanied today. I first met him on 09/24/2014 at the request of his primary care physician at which time he reported symptoms of tingling and burning sensation in both feet which started after his open-heart surgery in July 2013. He felt symptoms were progressive. I suggested workup in the form of blood work and EMG nerve conduction testing. Blood work from 09/24/2014 showed normal ESR, normal CRP, normal rheumatoid factor, normal ANA, normal B1, B6, increased B12 level, normal IFE and PE, normal copper, normal HTLV antibodies and normal folate. EMG and nerve conduction test from 10/05/2014 was reported as normal. We called him with the test results.  Today, 11/23/2014: He reports tingling in the feet and some in his fingers. Symptoms are somewhat worse at night when he is laying in bed. He does endorse some restlessness. He has stopped his B12 supplement. His symptoms have not progressed since last time. He is having trouble sleeping. He was diagnosed with obstructive sleep apnea and had a CPAP titration study over 2 years ago which I reviewed. This was done at the [facility] heart [facility] sleep [facility]. He was titrated on CPAP at 12 cm. He endorses compliance with treatment but still has trouble maintaining sleep and does not wake up rested. He wonders if his machine  needs to be rechecked. He wonders if he would feel better after sleeping better.  Previously:  He reports having had open heart surgery 02/2012 and since then has had tingling in both feet and burning sensation, progressive over time. It started in the feet and now seems to have traveled up to the thighs. No weakness, no atrophy, no twitching and symptoms fluctuate as i are milder in the morning when he has some tingling but he may have frank burning pain by the end of the day. He has intermittent tingling in his fingers as well. He broke his right the toe and had surgery on it. His toes stay cold. He also has some intermittent abnormal sensation and metallic taste since his heart surgery. This improved. He has soreness around his scar from the open heart surgery. It bothers him to wear her seatbelt as it causes him to feel very sore when the seatbelt rubs. For symptomatic treatment of his nerve related pain he was tried on Lyrica 50 mg each night for about a month but it did not help and he had insomnia from it. His cardiologist suggested ibuprofen 800 mg twice daily for a few days for the chest wall pain but he did not think it helped and he did not try it consistently. He is compliant with his CPAP machine. His latest hemoglobin A1c was 5.9. He had his thyroid function tested and sees her endocrinologist. He has no family history of neuropathy and no strong family history of diabetes. He quit smoking in 95. He drinks 2-3 cups of coffee per day and has reduced his soda intake. He drinks beer occasionally.  He used to work in a burglary but has never been a heavy drinker and never drank liquor. His balance has not been as good for the past couple of years. Thankfully he has not fallen. He stays active and works around the house.    His Past Medical History Is Significant For: Past Medical History  Diagnosis Date  . Hypertension   . Hyperlipidemia   . Wears glasses     or contacts  . Deviated septum   .  Thyroid nodule   . GERD (gastroesophageal reflux disease)   . Heart attack   . MI (myocardial infarction) 1997  . 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 PONV (postoperative nausea and vomiting)     has in past, no problems after most recent surgery  . Hypothyroidism   . Shortness of breath     with exertion  . Sleep apnea     CPAP, sleep [facility]  . 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 S/P carotid endarterectomy 02/29/2012    Right CEA for asymptomatic severe right ICA stenosis   . CHF (congestive heart failure)   . CAD (coronary artery disease)   . CAD (coronary artery disease) 12/04/2011    Left Main Disease with 3- vessel CAD   . CAD (coronary artery disease)     seeing Dr Wyline Mood  . Dyslipidemia     His Past Surgical History Is Significant For: Past Surgical History  Procedure Laterality Date  . Coronary stent placement    . Nasal septum surgery    . Totol thyroidectomy  02/06/2011  . Foot surgery  06/30/11    artificial joint in big toe right foot   . Colonoscopy  15-20years and 12/04/11    Done at [facility]  . Upper gastrointestinal endoscopy  35-40 years ago    [facility]  . Colonoscopy  12/11/2011    Procedure: COLONOSCOPY;  Surgeon: Malissa Hippo, MD;  Location: [facility] END[facility] SUITE;  Service: Endoscopy;  Laterality: N/A;  830  . Cardiac catheterization      02/22/12  . Adenoidectomy    . Cardiovascular stress test  7/13  . Transesophageal echocardiogram    . Coronary artery bypass graft  02/29/2012    Procedure: CORONARY ARTERY BYPASS GRAFTING (CABG);  Surgeon: Purcell Nails, MD;  Location: [facility] OR;  Service: Open Heart Surgery;  Laterality: N/A;  times three using Left Internal Mammary Artery and Right Greater Saphenous Vein Graft Harvested Endoscopically  . Endarterectomy  02/29/2012    Procedure: ENDARTERECTOMY CAROTID;  Surgeon: Chuck Hint, MD;   Location: [facility] OR;  Service: Vascular;  Laterality: Right;  . Carotid endarterectomy    . Left heart catheterization with coronary angiogram N/A 02/20/2012    Procedure: LEFT HEART CATHETERIZATION WITH CORONARY ANGIOGRAM;  Surgeon: Lennette Bihari, MD;  Location: [facility] CATH LAB;  Service: Cardiovascular;  Laterality: N/A;    His Family History Is Significant For: Family History  Problem Relation Age of Onset  . Hypertension Mother   . Healthy Sister   . Healthy Brother   . Healthy Son   . Colon cancer Neg Hx   . Heart attack Father   . Heart disease Father     Before age 61  . Hyperlipidemia Father   . Hypertension Father   . Prostate cancer Father  His Social History Is Significant For: History   Social History  . Marital Status: Married    Spouse Name: Velva Harman  . Number of Children: 1  . Years of Education: 12   Occupational History  .      retired   Social History Main Topics  . Smoking status: Former Smoker    Types: Cigarettes    Quit date: 09/04/2001  . Smokeless tobacco: Never Used     Comment: smoked 48yrs, smoked 1/2 pack a day  . Alcohol Use: 1.8 - 2.4 oz/week    3-4 Cans of beer per week     Comment: social- beer , not on regular basis  . Drug Use: No  . Sexual Activity: Not on file   Other Topics Concern  . None   Social History Narrative   Consumes 2-3 cups of caffeine daily    His Allergies Are:  No Known Allergies:   His Current Medications Are:  Outpatient Encounter Prescriptions as of 11/23/2014  Medication Sig  . aspirin EC 81 MG tablet Take 81 mg by mouth daily.  Marland Kitchen atorvastatin (LIPITOR) 20 MG tablet Take 1 tablet (20 mg total) by mouth daily at 6 PM.  . Esomeprazole Magnesium (NEXIUM PO) OTC  . folic acid (FOLVITE) 902 MCG tablet Take 400 mcg by mouth daily.  . metoprolol (LOPRESSOR) 50 MG tablet Take 1 tablet (50 mg total) by mouth 2 (two) times daily.  . Misc Natural Products (OSTEO BI-FLEX ADV DOUBLE ST) TABS Take 1 tablet by mouth 2  (two) times daily.   . Multiple Vitamin (MULTIVITAMIN WITH MINERALS) TABS Take 1 tablet by mouth daily.  . Multiple Vitamins-Minerals (PRESERVISION AREDS 2 PO) Take by mouth.  . Omega-3 Fatty Acids (FISH OIL) 1000 MG CPDR Take 1,000 mg by mouth 2 (two) times daily.   Marland Kitchen omeprazole (PRILOSEC) 20 MG capsule Take 20 mg by mouth daily.  . Psyllium (METAMUCIL PO) Take 3 capsules by mouth daily.   . ramipril (ALTACE) 10 MG capsule Take 1 capsule by mouth daily.  Marland Kitchen SYNTHROID 100 MCG tablet   . zolpidem (AMBIEN) 10 MG tablet Take 10 mg by mouth at bedtime as needed for sleep.  :  Review of Systems:  Out of a complete 14 point review of systems, all are reviewed and negative with the exception of these symptoms as listed below:    Review of Systems  All other systems reviewed and are negative.   Objective:  Neurologic Exam  Physical Exam Physical Examination:   Filed Vitals:   11/23/14 1126  BP: 138/78  Pulse: 88  Resp: 18    General Examination: The patient is a very pleasant 74 y.o. male in no acute distress. He appears well-developed and well-nourished and well groomed.   HEENT: Normocephalic, atraumatic, pupils are equal, round and reactive to light and accommodation. Funduscopic exam is normal with sharp disc margins noted. Extraocular tracking is good without limitation to gaze excursion or nystagmus noted. Normal smooth pursuit is noted. Hearing is grossly intact. Face is symmetric with normal facial animation and normal facial sensation. Speech is clear with no dysarthria noted. There is no hypophonia. There is no lip, neck/head, jaw or voice tremor. Neck is supple with full range of passive and active motion. There are no carotid bruits on auscultation. Oropharynx exam reveals: moderate mouth dryness, adequate dental hygiene and mild airway crowding. Mallampati is class II. Tongue protrudes centrally and palate elevates symmetrically.   Chest: Clear to auscultation without  wheezing, rhonchi or crackles noted. His sternotomy scar is unremarkable. He's not particularly tender around it at this time.   Heart: S1+S2+0, regular and normal without murmurs, rubs or gallops noted.   Abdomen: Soft, non-tender and non-distended with normal bowel sounds appreciated on auscultation.  Extremities: There is no pitting edema in the distal lower extremities bilaterally. Pedal pulses are somewhat difficult to palpate.   Skin: Warm and dry without trophic changes noted. There are no varicose veins. toes are mildly cold.   Musculoskeletal: exam reveals no obvious joint deformities, tenderness or joint swelling or erythema except for scar over his right big toe.   Neurologically:  Mental status: The patient is awake, alert and oriented in all 4 spheres. His immediate and remote memory, attention, language skills and fund of knowledge are appropriate. There is no evidence of aphasia, agnosia, apraxia or anomia. Speech is clear with normal prosody and enunciation. Thought process is linear. Mood is normal and affect is normal.  Cranial nerves II - XII are as described above under HEENT exam. In addition: shoulder shrug is normal with equal shoulder height noted. Motor exam: Normal bulk, strength and tone is noted. There is no drift, tremor or rebound. Romberg is negative. Reflexes are 2+ throughout. Babinski: Toes are flexor bilaterally. Fine motor skills and coordination: intact with normal finger taps, normal hand movements, normal rapid alternating patting, normal foot taps and normal foot agility.  Cerebellar testing: No dysmetria or intention tremor on finger to nose testing. Heel to shin is unremarkable bilaterally. There is no truncal or gait ataxia.  Sensory exam: intact to light touch, pinprick, vibration, temperature sense and proprioception in the upper and lower extremities.  Gait, station and balance: He stands easily. No veering to one side is noted. No leaning to one side  is noted. Posture is age-appropriate and stance is narrow based. Gait shows normal stride length and normal pace. No problems turning are noted. He turns en bloc. Tandem walk is  slightly difficult for him, unchanged.  Assessment and Plan:   In summary, Avraj D Ertl is a very pleasant 74 year old gentleman with an underlying medical history of high-grade right carotid artery stenosis and coronary artery disease, status post MI, status post CABG 3 vessel combined with right carotid endarterectomy on 02/29/2012, and congestive heart failure, followed by cardiology, hypertension, hyperlipidemia, reflux disease, hypothyroidism (s/p thyroidectomy), obstructive sleep apnea on CPAP therapy,  and pre-diabetes, who reports having had open heart surgery 02/2012 and since then has had tingling in both feet and burning sensation, progressive over time. It started in the feet and now seems to have traveled up to the thighs. His neurological exam is nonfocal at this time. We reviewed his recent workup including blood test results which were nonrevealing and EMG and nerve conductions testing which did not show any widespread neuropathy. Clinically he is stable but does endorse some symptoms consistent with restless leg syndrome. This may be in part a contributing factor. Before we resort to trying a dopamine agonist and symptomatic treatment for RLS I would like to proceed with a sleep study. He is requesting a reevaluation of his OSA and feels that he's not sleeping well. He has sleep disruption, difficulty maintaining sleep, residual daytime tiredness and this in the context of being compliant with CPAP therapy. His last sleep study was over 2 years ago in February 2014. To that end, I would like to have him come back for a split-night sleep study for reevaluation of his  underlying OSA but also to monitor for periodic leg movements. He was in agreement. I will see him back after the sleep studies completed. We will pick  up our discussion regarding symptomatic treatment of restless leg symptoms at the time. I answered all his questions today and he was in agreement. I spent 25 minutes in total face-to-face time with the patient, more than 50% of which was spent in counseling and coordination of care, reviewing test results, reviewing medication and discussing or reviewing the diagnosis of paresthesias, OSA, the prognosis and treatment options.

## 2014-12-06 ENCOUNTER — Ambulatory Visit (INDEPENDENT_AMBULATORY_CARE_PROVIDER_SITE_OTHER): Payer: Commercial Managed Care - HMO | Admitting: Neurology

## 2014-12-06 DIAGNOSIS — G4720 Circadian rhythm sleep disorder, unspecified type: Secondary | ICD-10-CM

## 2014-12-06 DIAGNOSIS — G479 Sleep disorder, unspecified: Secondary | ICD-10-CM

## 2014-12-06 DIAGNOSIS — G4733 Obstructive sleep apnea (adult) (pediatric): Secondary | ICD-10-CM

## 2014-12-06 DIAGNOSIS — G4761 Periodic limb movement disorder: Secondary | ICD-10-CM

## 2014-12-07 NOTE — Sleep Study (Signed)
Please see the scanned sleep study interpretation located in the Procedure tab within the Chart Review section. 

## 2014-12-09 ENCOUNTER — Other Ambulatory Visit: Payer: Self-pay | Admitting: Physician Assistant

## 2014-12-09 DIAGNOSIS — L821 Other seborrheic keratosis: Secondary | ICD-10-CM | POA: Diagnosis not present

## 2014-12-09 DIAGNOSIS — C4492 Squamous cell carcinoma of skin, unspecified: Secondary | ICD-10-CM

## 2014-12-09 DIAGNOSIS — L570 Actinic keratosis: Secondary | ICD-10-CM | POA: Diagnosis not present

## 2014-12-09 DIAGNOSIS — C44229 Squamous cell carcinoma of skin of left ear and external auricular canal: Secondary | ICD-10-CM | POA: Diagnosis not present

## 2014-12-09 DIAGNOSIS — L820 Inflamed seborrheic keratosis: Secondary | ICD-10-CM | POA: Diagnosis not present

## 2014-12-09 DIAGNOSIS — L814 Other melanin hyperpigmentation: Secondary | ICD-10-CM | POA: Diagnosis not present

## 2014-12-09 DIAGNOSIS — C44221 Squamous cell carcinoma of skin of unspecified ear and external auricular canal: Secondary | ICD-10-CM | POA: Diagnosis not present

## 2014-12-09 DIAGNOSIS — D485 Neoplasm of uncertain behavior of skin: Secondary | ICD-10-CM | POA: Diagnosis not present

## 2014-12-09 HISTORY — DX: Squamous cell carcinoma of skin, unspecified: C44.92

## 2014-12-16 ENCOUNTER — Telehealth: Payer: Self-pay | Admitting: Neurology

## 2014-12-16 NOTE — Telephone Encounter (Signed)
Please call and notify the patient that the recent sleep study did not show any significant obstructive sleep apnea, but he had significant leg twitching, which was disruptive to his sleep and this would be in keeping with his restless legs symptoms. Please inform patient that I would like to go over the details of the study during a follow up appointment, pls arrange a followup appointment. Also, route or fax report to PCP.  Once you have spoken to patient, you can close this encounter.   Thanks,  Star Age, MD, PhD [facility])

## 2014-12-17 NOTE — Telephone Encounter (Signed)
Patient is aware of below results. He has made an appt for 5/10. I will fax sleep study to Dr. Nevada Crane (referring doctor).

## 2014-12-22 ENCOUNTER — Telehealth: Payer: Self-pay

## 2014-12-22 ENCOUNTER — Ambulatory Visit: Payer: Self-pay | Admitting: Neurology

## 2014-12-22 NOTE — Telephone Encounter (Signed)
Patient did not show to appt today  

## 2014-12-28 ENCOUNTER — Encounter: Payer: Self-pay | Admitting: Neurology

## 2015-01-07 DIAGNOSIS — L570 Actinic keratosis: Secondary | ICD-10-CM | POA: Diagnosis not present

## 2015-01-07 DIAGNOSIS — C44221 Squamous cell carcinoma of skin of unspecified ear and external auricular canal: Secondary | ICD-10-CM | POA: Diagnosis not present

## 2015-01-20 ENCOUNTER — Ambulatory Visit (INDEPENDENT_AMBULATORY_CARE_PROVIDER_SITE_OTHER): Payer: Commercial Managed Care - HMO | Admitting: Neurology

## 2015-01-20 ENCOUNTER — Encounter: Payer: Self-pay | Admitting: Neurology

## 2015-01-20 VITALS — BP 120/68 | HR 72 | Resp 18 | Ht 73.0 in | Wt 205.0 lb

## 2015-01-20 DIAGNOSIS — R202 Paresthesia of skin: Secondary | ICD-10-CM | POA: Diagnosis not present

## 2015-01-20 DIAGNOSIS — G2581 Restless legs syndrome: Secondary | ICD-10-CM | POA: Diagnosis not present

## 2015-01-20 DIAGNOSIS — G479 Sleep disorder, unspecified: Secondary | ICD-10-CM

## 2015-01-20 DIAGNOSIS — G4761 Periodic limb movement disorder: Secondary | ICD-10-CM

## 2015-01-20 MED ORDER — ROPINIROLE HCL 0.25 MG PO TABS: ORAL_TABLET | ORAL | Status: DC

## 2015-01-20 NOTE — Patient Instructions (Signed)
For your nighttime leg twitching, we will try Requip (generic name: ropinirole) 0.25 mg: Take one pill each night 1 week, then 2 pills each night for 1 week, then 3 pills each night for 1 week, then 4 pills each night thereafter. Take 90-120 minutes before projected bedtime.  Common side effects reported are: Sedation, sleepiness, nausea, vomiting, and rare side effects are confusion, hallucinations, swelling in legs, and abnormal behaviors, including impulse control problems, which can manifest as excessive eating, obsessions with food or gambling, or hypersexuality. We are doing additional blood work today and will call you with the results.

## 2015-01-20 NOTE — Progress Notes (Signed)
Subjective:    Patient ID: [name] [name] is a 74 y.o. male.  HPI     Interim history:   Mr. 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 The patient is unaccompanied today. I last saw him on 11/23/2014, at which time he reported tingling in the feet and some in his fingers. Symptoms were somewhat worse at night when he was laying in bed. He endorsed some restless leg symptoms. He had stopped his B12 supplement. His symptoms have not progressed since his last visit. He was having trouble sleeping. He was previously diagnosed with obstructive sleep apnea and was given CPAP treatment. He was using CPAP but felt not rested and I suggested that he come back for sleep study. He had a baseline sleep study on 12/06/2014 and I went over his test results with him in detail today. Sleep efficiency was 55% with a normal sleep latency of 16.5 minutes with wake after sleep onset of 171.5 minutes with moderate sleep fragmentation noted. His arousal index was elevated, secondary to periodic leg movements primarily. He had increased percentages of stage I and stage II sleep, absence of slow-wave sleep and a decreased percentage of REM sleep with a mildly prolonged REM latency. He had severe periodic leg movements with significant arousals. His PLM index was 84.2 per hour, the associated arousal index 11.2 per hour. He had mild to moderate snoring. Total AHI was normal at 3.7 per hour, rising to 7.6 per hour in the  supine position. Average oxygen saturation was 93%, nadir was 86%. Time below 88% saturation was 1 minutes and 12 seconds.  Of note, he no showed for an appointment on 12/22/2014.  Today, 01/20/2015: he reports that he literally forgot about his appointment in May. He reports unchanged symptoms in his legs and feet. Symptoms are more bothersome at night. Symptoms have been ongoing for 3 years nearly. He has had no significant progression since I last saw him. We talked about his sleep test results. We talked about prior test results as well including blood work. He's never had anemia or iron deficiency.  Previously:  I first met him on 09/24/2014 at the request of his primary care physician at which time he reported symptoms of tingling and burning sensation in both feet which started after his open-heart surgery in July 2013. He felt symptoms were progressive. I suggested workup in the form of blood work and EMG nerve conduction testing. Blood work from 09/24/2014 showed normal ESR, normal CRP, normal rheumatoid factor, normal ANA, normal B1, B6, increased B12 level, normal IFE and PE, normal copper, normal HTLV antibodies and normal folate. EMG and nerve conduction test from 10/05/2014 was reported as normal. We called him with the test results.  He reports having had open heart surgery 02/2012 and since then has had tingling in both feet and burning sensation, progressive over time. It started in the feet and now seems to have traveled up to the thighs. No weakness, no atrophy, no twitching and symptoms fluctuate as i are milder in the morning when he has some tingling but he may have  frank burning pain by the end of the day. He has intermittent tingling in his fingers as well. He broke his right the toe and had surgery on it. His toes stay cold. He also has some intermittent abnormal sensation and metallic taste since his heart surgery. This improved. He has soreness around his scar from the open heart  surgery. It bothers him to wear her seatbelt as it causes him to feel very sore when the seatbelt rubs. For symptomatic treatment of his nerve related pain he was tried on Lyrica 50 mg each night for about a month but it did not help and he had insomnia from it. His cardiologist suggested ibuprofen 800 mg twice daily for a few days for the chest wall pain but he did not think it helped and he did not try it consistently. He is compliant with his CPAP machine. His latest hemoglobin A1c was 5.9. He had his thyroid function tested and sees her endocrinologist. He has no family history of neuropathy and no strong family history of diabetes. He quit smoking in 95. He drinks 2-3 cups of coffee per day and has reduced his soda intake. He drinks beer occasionally. He used to work in a burglary but has never been a heavy drinker and never drank liquor. His balance has not been as good for the past couple of years. Thankfully he has not fallen. He stays active and works around the house.    His Past Medical History Is Significant For: Past Medical History  Diagnosis Date  . Hypertension   . Hyperlipidemia   . Wears glasses     or contacts  . Deviated septum   . Thyroid nodule   . GERD (gastroesophageal reflux disease)   . Heart attack   . MI (myocardial infarction) 1997  . 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 PONV (postoperative nausea and vomiting)     has in past, no problems after most recent surgery  . Hypothyroidism   . Shortness of breath     with exertion  . Sleep apnea     CPAP, sleep study at [facility]  . 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 S/P carotid endarterectomy 02/29/2012    Right CEA for asymptomatic severe right ICA stenosis   . CHF (congestive heart failure)   . CAD (coronary artery disease)   . CAD (coronary artery disease) 12/04/2011    Left Main Disease with 3- vessel CAD   . CAD  (coronary artery disease)     seeing Dr Harl Bowie  . Dyslipidemia     His Past Surgical History Is Significant For: Past Surgical History  Procedure Laterality Date  . Coronary stent placement    . Nasal septum surgery    . Totol thyroidectomy  02/06/2011  . Foot surgery  06/30/11    artificial joint in big toe right foot   . Colonoscopy  15-20years and 12/04/11    Done at [facility]  . Upper gastrointestinal endoscopy  35-40 years ago    Procedure [facility]  . Colonoscopy  12/11/2011    Procedure: COLONOSCOPY;  Surgeon: Rogene Houston, MD;  Location: [facility] ENDO SUITE;  Service: Endoscopy;  Laterality: N/A;  830  . Cardiac catheterization      02/22/12  . Adenoidectomy    . Cardiovascular stress test  7/13  . Transesophageal echocardiogram    .  Coronary artery bypass graft  02/29/2012    Procedure: CORONARY ARTERY BYPASS GRAFTING (CABG);  Surgeon: Clarence H Owen, MD;  Location: [facility] OR;  Service: Open Heart Surgery;  Laterality: N/A;  times three using Left Internal Mammary Artery and Right Greater Saphenous Vein Graft Harvested Endoscopically  . Endarterectomy  02/29/2012    Procedure: ENDARTERECTOMY CAROTID;  Surgeon: Christopher S Dickson, MD;  Location: [facility] OR;  Service: Vascular;  Laterality: Right;  . Carotid endarterectomy    . Left heart catheterization with coronary angiogram N/A 02/20/2012    Procedure: LEFT HEART CATHETERIZATION WITH CORONARY ANGIOGRAM;  Surgeon: Thomas A Kelly, MD;  Location: [facility] CATH LAB;  Service: Cardiovascular;  Laterality: N/A;    His Family History Is Significant For: Family History  Problem Relation Age of Onset  . Hypertension Mother   . Healthy Sister   . Healthy Brother   . Healthy Son   . Colon cancer Neg Hx   . Heart attack Father   . Heart disease Father     Before age 60  . Hyperlipidemia Father   . Hypertension Father   . Prostate cancer Father     His Social History Is Significant For: History   Social History  . Marital Status:  Married    Spouse Name: Rita  . Number of Children: 1  . Years of Education: 12   Occupational History  .      retired   Social History Main Topics  . Smoking status: Former Smoker    Types: Cigarettes    Quit date: 09/04/2001  . Smokeless tobacco: Never Used     Comment: smoked 15yrs, smoked 1/2 pack a day  . Alcohol Use: 1.8 - 2.4 oz/week    3-4 Cans of beer per week     Comment: social- beer , not on regular basis  . Drug Use: No  . Sexual Activity: Not on file   Other Topics Concern  . None   Social History Narrative   Consumes 2-3 cups of caffeine daily    His Allergies Are:  No Known Allergies:   His Current Medications Are:  Outpatient Encounter Prescriptions as of 01/20/2015  Medication Sig  . aspirin EC 81 MG tablet Take 81 mg by mouth daily.  . atorvastatin (LIPITOR) 20 MG tablet Take 1 tablet (20 mg total) by mouth daily at 6 PM.  . Esomeprazole Magnesium (NEXIUM PO) OTC  . folic acid (FOLVITE) 400 MCG tablet Take 400 mcg by mouth daily.  . metoprolol (LOPRESSOR) 50 MG tablet Take 1 tablet (50 mg total) by mouth 2 (two) times daily.  . Misc Natural Products (OSTEO BI-FLEX ADV DOUBLE ST) TABS Take 1 tablet by mouth 2 (two) times daily.   . Multiple Vitamin (MULTIVITAMIN WITH MINERALS) TABS Take 1 tablet by mouth daily.  . Multiple Vitamins-Minerals (PRESERVISION AREDS 2 PO) Take by mouth.  . Omega-3 Fatty Acids (FISH OIL) 1000 MG CPDR Take 1,000 mg by mouth 2 (two) times daily.   . omeprazole (PRILOSEC) 20 MG capsule Take 20 mg by mouth daily.  . Psyllium (METAMUCIL PO) Take 3 capsules by mouth daily.   . ramipril (ALTACE) 10 MG capsule Take 1 capsule by mouth daily.  . SYNTHROID 100 MCG tablet   . zolpidem (AMBIEN) 10 MG tablet Take 10 mg by mouth at bedtime as needed for sleep.   No facility-administered encounter medications on file as of 01/20/2015.  :  Review of Systems:  Out of a complete 14   point review of systems, all are reviewed and negative with  the exception of these symptoms as listed below:   Review of Systems  Neurological:       Restless legs  All other systems reviewed and are negative.   Objective:  Neurologic Exam  Physical Exam Physical Examination:   Filed Vitals:   01/20/15 1324  BP: 120/68  Pulse: 72  Resp: 18    General Examination: The patient is a very pleasant 73 y.o. male in no acute distress. He appears well-developed and well-nourished and well groomed. He is in good spirits today.  HEENT: Normocephalic, atraumatic, pupils are equal, round and reactive to light and accommodation. Funduscopic exam is normal with sharp disc margins noted. Extraocular tracking is good without limitation to gaze excursion or nystagmus noted. Normal smooth pursuit is noted. Hearing is grossly intact. Face is symmetric with normal facial animation and normal facial sensation. Speech is clear with no dysarthria noted. There is no hypophonia. There is no lip, neck/head, jaw or voice tremor. Neck is supple with full range of passive and active motion. There are no carotid bruits on auscultation. Oropharynx exam reveals: moderate mouth dryness, adequate dental hygiene and mild airway crowding. Mallampati is class II. Tongue protrudes centrally and palate elevates symmetrically.   Chest: Clear to auscultation without wheezing, rhonchi or crackles noted. His sternotomy scar is unremarkable. He's not particularly tender around it at this time.   Heart: S1+S2+0, regular and normal without murmurs, rubs or gallops noted.   Abdomen: Soft, non-tender and non-distended with normal bowel sounds appreciated on auscultation.  Extremities: There is no pitting edema in the distal lower extremities bilaterally. Pedal pulses are somewhat difficult to palpate, unchanged.   Skin: Warm and dry without trophic changes noted. There are no varicose veins. toes are mildly cold.   Musculoskeletal: exam reveals no obvious joint deformities, tenderness or  joint swelling or erythema except for scar over his right big toe.   Neurologically:  Mental status: The patient is awake, alert and oriented in all 4 spheres. His immediate and remote memory, attention, language skills and fund of knowledge are appropriate. There is no evidence of aphasia, agnosia, apraxia or anomia. Speech is clear with normal prosody and enunciation. Thought process is linear. Mood is normal and affect is normal.  Cranial nerves II - XII are as described above under HEENT exam. In addition: shoulder shrug is normal with equal shoulder height noted. Motor exam: Normal bulk, strength and tone is noted. There is no drift, tremor or rebound. Romberg is negative. Reflexes are 2+ throughout. Babinski: Toes are flexor bilaterally. Fine motor skills and coordination: intact with normal finger taps, normal hand movements, normal rapid alternating patting, normal foot taps and normal foot agility.  Cerebellar testing: No dysmetria or intention tremor on finger to nose testing. Heel to shin is unremarkable bilaterally. There is no truncal or gait ataxia.  Sensory exam: intact to light touch, pinprick, vibration, temperature sense in the upper and lower extremities.  Gait, station and balance: He stands easily. No veering to one side is noted. No leaning to one side is noted. Posture is age-appropriate and stance is narrow based. Gait shows normal stride length and normal pace. No problems turning are noted. He turns en bloc. Tandem walk is  slightly difficult for him, unchanged from before.   Assessment and Plan:   In summary, Dandrae D Abernathy is a very pleasant 73-year old gentleman with an underlying medical history of high-grade right   carotid artery stenosis and coronary artery disease, status post MI, status post CABG 3 vessel combined with right carotid endarterectomy on 02/29/2012, and congestive heart failure, followed by cardiology, hypertension, hyperlipidemia, reflux disease,  hypothyroidism (s/p thyroidectomy), obstructive sleep apnea on CPAP therapy,  and pre-diabetes, who reports having had open heart surgery 02/2012 and since then has had tingling in both feet and burning sensation, progressive over time. It started in the feet and now seems to have traveled up to the thighs. His neurological exam is nonfocal at this time, and he is again reassured. We again reviewed prior workup including blood test results and the EMG nerve conduction test which did not show any signs of widespread neuropathy. He had a sleep study recently in April and we talked about the test results. While it did not show any significant obstructive sleep apnea with an AHI of 3.7, he did not sleep very well well and had severe periodic leg movements with significant arousals. I think his symptoms may be in part related to restless leg syndrome and PLMD. This may disturb his sleep and cause daytime symptoms including nonrestorative sleep and daytime somnolence. At this juncture, would like to add more blood tests including iron studies and ferritin level. We will call him with his test results. I also talked to him about symptomatic treatment of restless leg syndrome and PLMD. To that end, I would like to try him on Requip starting at low dose, 0.25 mg each night. He is advised to take it 90-120 minutes before bedtime. We talked about potential side effects. We will do a slow titration in weekly increments to up to 1 mg nightly. He was given a new prescription and written instructions. I answered all his questions today and he was in agreement. I would like to see him back in about 3 months, sooner if the need arises. He can continue using his CPAP machine. However, he did not qualify for CPAP titration due to a total AHI of less than 5 per hour. Should he have any questions or concerns he is advised to call or email through My Chart.  I spent 25 minutes in total face-to-face time with the patient, more than 50%  of which was spent in counseling and coordination of care, reviewing test results, reviewing medication and discussing or reviewing the diagnosis of RLS/PLMD, paresthesias, OSA, the prognosis and treatment options.

## 2015-01-21 ENCOUNTER — Telehealth: Payer: Self-pay

## 2015-01-21 LAB — CBC WITH DIFFERENTIAL/PLATELET
BASOS ABS: 0 10*3/uL (ref 0.0–0.2)
Basos: 0 %
EOS (ABSOLUTE): 0.4 10*3/uL (ref 0.0–0.4)
Eos: 5 %
HEMATOCRIT: 45.2 % (ref 37.5–51.0)
Hemoglobin: 15.4 g/dL (ref 12.6–17.7)
Immature Grans (Abs): 0 10*3/uL (ref 0.0–0.1)
Immature Granulocytes: 0 %
Lymphocytes Absolute: 2.1 10*3/uL (ref 0.7–3.1)
Lymphs: 31 %
MCH: 31.1 pg (ref 26.6–33.0)
MCHC: 34.1 g/dL (ref 31.5–35.7)
MCV: 91 fL (ref 79–97)
MONOCYTES: 7 %
MONOS ABS: 0.5 10*3/uL (ref 0.1–0.9)
NEUTROS ABS: 3.8 10*3/uL (ref 1.4–7.0)
NEUTROS PCT: 57 %
Platelets: 195 10*3/uL (ref 150–379)
RBC: 4.95 x10E6/uL (ref 4.14–5.80)
RDW: 13.4 % (ref 12.3–15.4)
WBC: 6.7 10*3/uL (ref 3.4–10.8)

## 2015-01-21 LAB — IRON AND TIBC
Iron Saturation: 27 % (ref 15–55)
Iron: 65 ug/dL (ref 38–169)
Total Iron Binding Capacity: 239 ug/dL — ABNORMAL LOW (ref 250–450)
UIBC: 174 ug/dL (ref 111–343)

## 2015-01-21 LAB — FERRITIN: Ferritin: 86 ng/mL (ref 30–400)

## 2015-01-21 NOTE — Telephone Encounter (Signed)
Left message that lab work is normal and he can start his new medication.

## 2015-01-21 NOTE — Telephone Encounter (Signed)
Patient called/returning Diana's call. I relayed the information. Patient understood.

## 2015-01-21 NOTE — Telephone Encounter (Signed)
-----   Message from Star Age, MD sent at 01/21/2015  8:23 AM EDT ----- Otelia Sergeant studies and cell count and hemoglobin all fine. He can continue as planned with the new medication, Requip/ropinirole. Pls advise pt. thx Star Age, MD, PhD [facility])

## 2015-01-21 NOTE — Progress Notes (Signed)
Quick Note:  [name] [name] studies and cell count and hemoglobin all fine. He can continue as planned with the new medication, Requip/ropinirole. Pls advise pt. thx Star Age, MD, PhD [facility] ([facility])  ______

## 2015-02-18 DIAGNOSIS — R7301 Impaired fasting glucose: Secondary | ICD-10-CM | POA: Diagnosis not present

## 2015-02-18 DIAGNOSIS — E782 Mixed hyperlipidemia: Secondary | ICD-10-CM | POA: Diagnosis not present

## 2015-02-18 DIAGNOSIS — I10 Essential (primary) hypertension: Secondary | ICD-10-CM | POA: Diagnosis not present

## 2015-02-18 DIAGNOSIS — E039 Hypothyroidism, unspecified: Secondary | ICD-10-CM | POA: Diagnosis not present

## 2015-02-19 DIAGNOSIS — G4733 Obstructive sleep apnea (adult) (pediatric): Secondary | ICD-10-CM | POA: Diagnosis not present

## 2015-02-24 DIAGNOSIS — I10 Essential (primary) hypertension: Secondary | ICD-10-CM | POA: Diagnosis not present

## 2015-02-24 DIAGNOSIS — E039 Hypothyroidism, unspecified: Secondary | ICD-10-CM | POA: Diagnosis not present

## 2015-02-24 DIAGNOSIS — R7301 Impaired fasting glucose: Secondary | ICD-10-CM | POA: Diagnosis not present

## 2015-03-16 DIAGNOSIS — H40013 Open angle with borderline findings, low risk, bilateral: Secondary | ICD-10-CM | POA: Diagnosis not present

## 2015-03-16 DIAGNOSIS — H35371 Puckering of macula, right eye: Secondary | ICD-10-CM | POA: Diagnosis not present

## 2015-03-16 DIAGNOSIS — H43811 Vitreous degeneration, right eye: Secondary | ICD-10-CM | POA: Diagnosis not present

## 2015-03-16 DIAGNOSIS — H04123 Dry eye syndrome of bilateral lacrimal glands: Secondary | ICD-10-CM | POA: Diagnosis not present

## 2015-03-30 DIAGNOSIS — C4492 Squamous cell carcinoma of skin, unspecified: Secondary | ICD-10-CM | POA: Diagnosis not present

## 2015-03-30 DIAGNOSIS — L570 Actinic keratosis: Secondary | ICD-10-CM | POA: Diagnosis not present

## 2015-04-20 ENCOUNTER — Encounter: Payer: Self-pay | Admitting: Family

## 2015-04-21 ENCOUNTER — Ambulatory Visit (INDEPENDENT_AMBULATORY_CARE_PROVIDER_SITE_OTHER): Payer: Commercial Managed Care - HMO | Admitting: Family

## 2015-04-21 ENCOUNTER — Ambulatory Visit (HOSPITAL_COMMUNITY)
Admission: RE | Admit: 2015-04-21 | Discharge: 2015-04-21 | Disposition: A | Discharge status: Home / Self Care | Payer: Commercial Managed Care - HMO | Source: Ambulatory Visit | Attending: Family | Admitting: Family

## 2015-04-21 ENCOUNTER — Encounter: Payer: Self-pay | Admitting: Family

## 2015-04-21 ENCOUNTER — Other Ambulatory Visit: Payer: Self-pay | Admitting: Family

## 2015-04-21 VITALS — BP 135/85 | HR 68 | Ht 73.0 in | Wt 209.0 lb

## 2015-04-21 DIAGNOSIS — I6523 Occlusion and stenosis of bilateral carotid arteries: Secondary | ICD-10-CM

## 2015-04-21 DIAGNOSIS — Z48812 Encounter for surgical aftercare following surgery on the circulatory system: Secondary | ICD-10-CM

## 2015-04-21 DIAGNOSIS — I10 Essential (primary) hypertension: Secondary | ICD-10-CM | POA: Insufficient documentation

## 2015-04-21 DIAGNOSIS — E785 Hyperlipidemia, unspecified: Secondary | ICD-10-CM | POA: Diagnosis not present

## 2015-04-21 DIAGNOSIS — Z9889 Other specified postprocedural states: Secondary | ICD-10-CM

## 2015-04-21 NOTE — Patient Instructions (Signed)
Stroke Prevention Some medical conditions and behaviors are associated with an increased chance of having a stroke. You may prevent a stroke by making healthy choices and managing medical conditions. HOW CAN I REDUCE MY RISK OF HAVING A STROKE?   Stay physically active. Get at least 30 minutes of activity on most or all days.  Do not smoke. It may also be helpful to avoid exposure to secondhand smoke.  Limit alcohol use. Moderate alcohol use is considered to be:  No more than 2 drinks per day for men.  No more than 1 drink per day for nonpregnant women.  Eat healthy foods. This involves:  Eating 5 or more servings of fruits and vegetables a day.  Making dietary changes that address high blood pressure (hypertension), high cholesterol, diabetes, or obesity.  Manage your cholesterol levels.  Making food choices that are high in fiber and low in saturated fat, trans fat, and cholesterol may control cholesterol levels.  Take any prescribed medicines to control cholesterol as directed by your health care provider.  Manage your diabetes.  Controlling your carbohydrate and sugar intake is recommended to manage diabetes.  Take any prescribed medicines to control diabetes as directed by your health care provider.  Control your hypertension.  Making food choices that are low in salt (sodium), saturated fat, trans fat, and cholesterol is recommended to manage hypertension.  Take any prescribed medicines to control hypertension as directed by your health care provider.  Maintain a healthy weight.  Reducing calorie intake and making food choices that are low in sodium, saturated fat, trans fat, and cholesterol are recommended to manage weight.  Stop drug abuse.  Avoid taking birth control pills.  Talk to your health care provider about the risks of taking birth control pills if you are over 35 years old, smoke, get migraines, or have ever had a blood clot.  Get evaluated for sleep  disorders (sleep apnea).  Talk to your health care provider about getting a sleep evaluation if you snore a lot or have excessive sleepiness.  Take medicines only as directed by your health care provider.  For some people, aspirin or blood thinners (anticoagulants) are helpful in reducing the risk of forming abnormal blood clots that can lead to stroke. If you have the irregular heart rhythm of atrial fibrillation, you should be on a blood thinner unless there is a good reason you cannot take them.  Understand all your medicine instructions.  Make sure that other conditions (such as anemia or atherosclerosis) are addressed. SEEK IMMEDIATE MEDICAL CARE IF:   You have sudden weakness or numbness of the face, arm, or leg, especially on one side of the body.  Your face or eyelid droops to one side.  You have sudden confusion.  You have trouble speaking (aphasia) or understanding.  You have sudden trouble seeing in one or both eyes.  You have sudden trouble walking.  You have dizziness.  You have a loss of balance or coordination.  You have a sudden, severe headache with no known cause.  You have new chest pain or an irregular heartbeat. Any of these symptoms may represent a serious problem that is an emergency. Do not wait to see if the symptoms will go away. Get medical help at once. Call your local emergency services (911 in [place] Do not drive yourself to the hospital. Document Released: 09/07/2004 Document Revised: 12/15/2013 Document Reviewed: 01/31/2013 [employer] Patient Information 2015 [employer]. This information is not intended to replace advice given   to you by your health care provider. Make sure you discuss any questions you have with your health care provider.  

## 2015-04-21 NOTE — Progress Notes (Signed)
Established Carotid Patient   History of Present Illness  [name] [name] is a 74 y.o. male patient of Dr. Scot Dock who was found have a greater than 80% right carotid stenosis. Preoperative cardiac evaluation showed evidence of significant coronary disease. He underwent combined right carotid endarterectomy and coronary revascularization on 02/29/2012.   He comes in for a yearly follow up visit. He complains of tingling, numbness, and burning sensation in his hands and feet since his open heart surgery, worse at night; is being evaluated by [facility].   He states he has been checked for vitamin B deficiency and he is not deficient. He denies claudication symptoms with walking.  Patient has no history of TIA or stroke symptoms.Specifically he denies a history of amaurosis fugax or monocular blindness, unilateral facial drooping, hemiplegia, or receptive or expressive aphasia.   Pt denies New Medical or Surgical History.  Pt Diabetic: No Pt smoker: former smoker, quit in the 1980's, his wife smokes outside the house. He is usually physically active.  Pt meds include: Statin : Yes ASA: Yes Other anticoagulants/antiplatelets: no     Past Medical History  Diagnosis Date  . Hypertension   . Hyperlipidemia   . Wears glasses     or contacts  . Deviated septum   . Thyroid nodule   . GERD (gastroesophageal reflux disease)   . Heart attack   . MI (myocardial infarction) 1997  . 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 PONV (postoperative nausea and vomiting)     has in past, no problems after most recent surgery  . Hypothyroidism   . Shortness of breath     with exertion  . Sleep apnea     CPAP, sleep study at [facility]  . 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 S/P carotid endarterectomy 02/29/2012    Right CEA for asymptomatic severe right ICA stenosis   . CHF (congestive heart  failure)   . CAD (coronary artery disease)   . CAD (coronary artery disease) 12/04/2011    Left Main Disease with 3- vessel CAD   . CAD (coronary artery disease)     seeing Dr Harl Bowie  . Dyslipidemia     Social History Social History  Substance Use Topics  . Smoking status: Former Smoker    Types: Cigarettes    Quit date: 09/04/2001  . Smokeless tobacco: Never Used     Comment: smoked 38yrs, smoked 1/2 pack a day  . Alcohol Use: 1.8 - 2.4 oz/week    3-4 Cans of beer per week     Comment: social- beer , not on regular basis    Family History Family History  Problem Relation Age of Onset  . Hypertension Mother   . Healthy Sister   . Healthy Brother   . Healthy Son   . Colon cancer Neg Hx   . Heart attack Father   . Heart disease Father     Before age 42  . Hyperlipidemia Father   . Hypertension Father   . Prostate cancer Father     Surgical History Past Surgical History  Procedure Laterality Date  . Coronary stent placement    . Nasal septum surgery    . Totol thyroidectomy  02/06/2011  . Foot surgery  06/30/11    artificial joint in big toe right foot   . Colonoscopy  15-20years  and 12/04/11    Done at [facility]  . Upper gastrointestinal endoscopy  35-40 years ago    [facility]  . Colonoscopy  12/11/2011    Procedure: COLONOSCOPY;  Surgeon: Rogene Houston, MD;  Location: [facility];  Service: Endoscopy;  Laterality: N/A;  830  . Cardiac catheterization      02/22/12  . Adenoidectomy    . Cardiovascular stress test  7/13  . Transesophageal echocardiogram    . Coronary artery bypass graft  02/29/2012    Procedure: CORONARY ARTERY BYPASS GRAFTING (CABG);  Surgeon: Rexene Alberts, MD;  Location: [place];  Service: Open Heart Surgery;  Laterality: N/A;  times three using Left Internal Mammary Artery and Right Greater Saphenous Vein Graft Harvested Endoscopically  . Endarterectomy  02/29/2012    Procedure: ENDARTERECTOMY CAROTID;  Surgeon: Angelia Mould, MD;  Location: [facility] OR;  Service: Vascular;  Laterality: Right;  . Carotid endarterectomy    . Left heart catheterization with coronary angiogram N/A 02/20/2012    Procedure: LEFT HEART CATHETERIZATION WITH CORONARY ANGIOGRAM;  Surgeon: Troy Sine, MD;  Location: [facility] CATH LAB;  Service: Cardiovascular;  Laterality: N/A;    No Known Allergies  Current Outpatient Prescriptions  Medication Sig Dispense Refill  . aspirin EC 81 MG tablet Take 81 mg by mouth daily.    Marland Kitchen atorvastatin (LIPITOR) 20 MG tablet Take 1 tablet (20 mg total) by mouth daily at 6 PM. 30 tablet 1  . Esomeprazole Magnesium (NEXIUM PO) OTC    . folic acid (FOLVITE) 169 MCG tablet Take 400 mcg by mouth daily.    . metoprolol (LOPRESSOR) 50 MG tablet Take 1 tablet (50 mg total) by mouth 2 (two) times daily. 180 tablet 1  . Misc Natural Products (OSTEO BI-FLEX ADV DOUBLE ST) TABS Take 1 tablet by mouth 2 (two) times daily.     . Multiple Vitamin (MULTIVITAMIN WITH MINERALS) TABS Take 1 tablet by mouth daily.    . Multiple Vitamins-Minerals (PRESERVISION AREDS 2 PO) Take by mouth.    . Omega-3 Fatty Acids (FISH OIL) 1000 MG CPDR Take 1,000 mg by mouth 2 (two) times daily.     Marland Kitchen omeprazole (PRILOSEC) 20 MG capsule Take 20 mg by mouth daily.    . Psyllium (METAMUCIL PO) Take 3 capsules by mouth daily.     . ramipril (ALTACE) 10 MG capsule Take 1 capsule by mouth daily.    Marland Kitchen rOPINIRole (REQUIP) 0.25 MG tablet 1 pill nightly x 1 week, then 2 pills nightly x 1 w, then 3 pills nightly x 1 w, then 4 pills nightly thereafter. 90-120 min before bedtime. 120 tablet 5  . SYNTHROID 100 MCG tablet     . zolpidem (AMBIEN) 10 MG tablet Take 10 mg by mouth at bedtime as needed for sleep.     No current facility-administered medications for this visit.    Review of Systems : See HPI for pertinent positives and negatives.  Physical Examination  Filed Vitals:   04/21/15 1602 04/21/15 1604  BP: 143/89 135/85  Pulse: 68   Height: 6'  1" (1.854 m)   Weight: 209 lb (94.802 kg)   SpO2: 96%    Body mass index is 27.58 kg/(m^2).   General: WDWN male in NAD GAIT: normal Eyes: PERRLA Pulmonary: Non-labored, CTAB, no rales, no rhonchi, & no wheezing.  Cardiac: regular Rhythm, no detected murmur.  VASCULAR EXAM Carotid Bruits Right Left   Negative Negative   Radial pulses  are 2+ palpable and equal.      LE Pulses Right Left   POPLITEAL not palpable  not palpable   POSTERIOR TIBIAL 2+ palpable  2+ palpable    DORSALIS PEDIS  ANTERIOR TIBIAL 1+ palpable  1+ palpable     Gastrointestinal: soft, nontender, BS WNL, no r/g, no palpable masses.  Musculoskeletal: Negative muscle atrophy/wasting. M/S 5/5 throughout, Extremities without ischemic changes.  Neurologic: A&O X 3; Appropriate Affect, Speech is normal CN 2-12 intact, Pain and light touch intact in extremities, Motor exam as listed above.          Non-Invasive Vascular Imaging CAROTID DUPLEX 04/21/2015   CEREBROVASCULAR DUPLEX EVALUATION    Carotid artery stenosis    PREVIOUS INTERVENTION(S): Right carotid endarterectomy 02/29/2012.        RIGHT  LEFT  End Diastolic Velocities (cm/s) Plaque LOCATION Peak Systolic Velocities (cm/s) End Diastolic Velocities (cm/s) Plaque  28  CCA PROXIMAL 88 24   18  CCA MID 73 29   18  CCA DISTAL 141 35 CP  21  ECA 70 9 CP  30  ICA PROXIMAL 141 40 CP  30  ICA MID 126 39   24  ICA DISTAL 89 28     Carotid endarterectomy ICA / CCA Ratio (PSV) 1.93  Antegrade Vertebral Flow Antegrade  627 Brachial Systolic Pressure (mmHg) 035  Triphasic Brachial Artery Waveforms Triphasic    Plaque Morphology:  HM = Homogeneous, HT = Heterogeneous, CP = Calcific Plaque, SP = Smooth Plaque, IP = Irregular Plaque  ADDITIONAL FINDINGS: Right subclavian  artery PSV 95cm/sec; Left subclavian artery PSV 219 cm/sec    Right internal carotid artery is patent with history of carotid endarterectomy, no hyperplasia or hemodynamically significant stenosis present. Left internal carotid artery stenosis present in the 40%-59% range.    Compared to the previous exam:  Stable on the right and increased on the left since previous study on 04/15/2014.     Assessment: [name] [name] is a 74 y.o. male who is s/p right carotid endarterectomy and coronary revascularization on 02/29/2012. He has no history of stroke or TIA. Today's carotid Duplex suggests a patent right internal carotid artery with history of carotid endarterectomy, no hyperplasia or hemodynamically significant stenosis present and  40%-59% left internal carotid artery stenosis. Stable on the right and increased stenosis on the left since previous study on 04/15/2014.   Plan: Follow-up in 1 year with Carotid Duplex.   I discussed in depth with the patient the nature of atherosclerosis, and emphasized the importance of maximal medical management including strict control of blood pressure, blood glucose, and lipid levels, obtaining regular exercise, and continued cessation of smoking.  The patient is aware that without maximal medical management the underlying atherosclerotic disease process will progress, limiting the benefit of any interventions. The patient was given information about stroke prevention and what symptoms should prompt the patient to seek immediate medical care. Thank you for allowing [place] to participate in this patient's care.  Clemon Chambers, RN, MSN, FNP-C [facility] Office: [phone]  Clinic Physician: Scot Dock  04/21/2015 3:25 PM

## 2015-04-22 NOTE — Addendum Note (Signed)
Addended by: Reola Calkins on: 04/22/2015 03:41 PM   Modules accepted: Orders

## 2015-04-28 ENCOUNTER — Encounter: Payer: Self-pay | Admitting: Neurology

## 2015-04-28 ENCOUNTER — Ambulatory Visit (INDEPENDENT_AMBULATORY_CARE_PROVIDER_SITE_OTHER): Payer: Commercial Managed Care - HMO | Admitting: Neurology

## 2015-04-28 VITALS — BP 138/82 | HR 72 | Resp 18 | Ht 73.0 in | Wt 207.0 lb

## 2015-04-28 DIAGNOSIS — R202 Paresthesia of skin: Secondary | ICD-10-CM

## 2015-04-28 DIAGNOSIS — G4761 Periodic limb movement disorder: Secondary | ICD-10-CM | POA: Diagnosis not present

## 2015-04-28 DIAGNOSIS — G2581 Restless legs syndrome: Secondary | ICD-10-CM

## 2015-04-28 DIAGNOSIS — G479 Sleep disorder, unspecified: Secondary | ICD-10-CM | POA: Diagnosis not present

## 2015-04-28 MED ORDER — ROPINIROLE HCL 0.5 MG PO TABS: 1.5000 mg | ORAL_TABLET | Freq: Every day | ORAL | Status: DC

## 2015-04-28 NOTE — Progress Notes (Signed)
Subjective:    Patient ID: [name] [name] is a 74 y.o. male.  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 The patient is unaccompanied today. I last saw him on 01/20/2015, at which time he reported simply forgetting about his appointment in May. He had unchanged symptoms in his legs and his feet. Symptoms were more bothersome at night but he felt that he had no significant progression since I saw him in April 2016. We talked about his sleep test results at the time and prior test results including blood work. He denied a diagnosis of anemia or iron deficiency in the past. I suggested that we start him on a trial of low-dose Requip, 0.25 mg strength with gradual titration to up to 1 mg each night for restless leg symptoms and PLMD. We checked blood work including iron studies and hemoglobin and cell count. Findings were unremarkable and we called him with his test results.  Today, 04/28/2015: he reports that he is able to tolerate ropinirole at 1 mg each night but has not noticed any difference. He reports no side effects in particular, no nausea, no lower extremity swelling, no significant sleepiness from it. He had a routine carotid Doppler study with vascular surgery on 04/16/2015 which I reviewed: No significant change from the study from before. Less than 40% left ICA stenosis, stable. No evidence of restenosis on the right after his carotid endarterectomy on  the right in 2013. One-year follow-up was recommended. He has had no interim medical illness or changes in his medication regimen.  Previously:  I saw him on 11/23/2014, at which time he reported tingling in the feet and some in his fingers. Symptoms were somewhat worse at night when he was laying in bed. He endorsed some restless leg symptoms. He had stopped his B12 supplement. His symptoms have not progressed since his last visit. He was having trouble sleeping. He was previously diagnosed with obstructive sleep apnea and was given CPAP treatment. He was using CPAP but felt not rested and I suggested that he come back for sleep study. He had a baseline sleep study on 12/06/2014 and I went over his test results with him in detail today. Sleep efficiency was 55% with a normal sleep latency of 16.5 minutes with wake after sleep onset of 171.5 minutes with moderate sleep fragmentation noted. His arousal index was elevated, secondary to periodic leg movements primarily. He had increased percentages of stage I and stage II sleep, absence of slow-wave sleep and a decreased percentage of REM sleep with a mildly prolonged REM latency. He had severe periodic leg movements with significant arousals. His PLM index was 84.2 per hour, the associated arousal index 11.2 per hour. He had mild to moderate snoring. Total AHI was normal at 3.7 per hour, rising to 7.6 per hour in the supine position. Average oxygen saturation was 93%, nadir was 86%. Time below 88% saturation was 1 minutes and 12 seconds.  Of note, he no showed for an appointment on 12/22/2014.  I first met him on 09/24/2014  at the request of his primary care physician at which time he reported symptoms of tingling and burning sensation in both feet which started after his open-heart surgery in July 2013. He felt symptoms were progressive. I suggested workup in the form of blood work and EMG nerve conduction testing. Blood work from 09/24/2014 showed normal  ESR, normal CRP, normal rheumatoid factor, normal ANA, normal B1, B6, increased B12 level, normal IFE and PE, normal copper, normal HTLV antibodies and normal folate. EMG and nerve conduction test from 10/05/2014 was reported as normal. We called him with the test results.  He reports having had open heart surgery 02/2012 and since then has had tingling in both feet and burning sensation, progressive over time. It started in the feet and now seems to have traveled up to the thighs. No weakness, no atrophy, no twitching and symptoms fluctuate as i are milder in the morning when he has some tingling but he may have frank burning pain by the end of the day. He has intermittent tingling in his fingers as well. He broke his right the toe and had surgery on it. His toes stay cold. He also has some intermittent abnormal sensation and metallic taste since his heart surgery. This improved. He has soreness around his scar from the open heart surgery. It bothers him to wear her seatbelt as it causes him to feel very sore when the seatbelt rubs. For symptomatic treatment of his nerve related pain he was tried on Lyrica 50 mg each night for about a month but it did not help and he had insomnia from it. His cardiologist suggested ibuprofen 800 mg twice daily for a few days for the chest wall pain but he did not think it helped and he did not try it consistently. He is compliant with his CPAP machine. His latest hemoglobin A1c was 5.9. He had his thyroid function tested and sees her endocrinologist. He has no family history of neuropathy and no strong family history of diabetes. He quit smoking in 95. He drinks 2-3 cups of coffee per day and has reduced his soda intake. He drinks beer occasionally. He used to work in a burglary but has never been a heavy drinker and never drank liquor. His balance has not been as good for the past couple of years. Thankfully he has not fallen. He stays active and works around the house.    His  Past Medical History Is Significant For: Past Medical History  Diagnosis Date  . Hypertension   . Hyperlipidemia   . Wears glasses     or contacts  . Deviated septum   . Thyroid nodule   . GERD (gastroesophageal reflux disease)   . Heart attack   . MI (myocardial infarction) 1997  . 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 PONV (postoperative nausea and vomiting)     has in past, no problems after most recent surgery  . Hypothyroidism   . Shortness of breath     with exertion  . Sleep apnea     CPAP, sleep study at [facility]  . 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 S/P carotid endarterectomy 02/29/2012    Right CEA for asymptomatic severe right ICA stenosis   . CHF (congestive heart failure)   . CAD (coronary artery disease)   . CAD (coronary artery disease) 12/04/2011  Left Main Disease with 3- vessel CAD   . CAD (coronary artery disease)     seeing Dr Harl Bowie  . Dyslipidemia     His Past Surgical History Is Significant For: Past Surgical History  Procedure Laterality Date  . Coronary stent placement    . Nasal septum surgery    . Totol thyroidectomy  02/06/2011  . Foot surgery  06/30/11    artificial joint in big toe right foot   . Colonoscopy  15-20years and 12/04/11    Done at [facility]  . Upper gastrointestinal endoscopy  35-40 years ago    [facility]  . Colonoscopy  12/11/2011    Procedure: COLONOSCOPY;  Surgeon: Rogene Houston, MD;  Location: [facility];  Service: Endoscopy;  Laterality: N/A;  830  . Cardiac catheterization      02/22/12  . Adenoidectomy    . Cardiovascular stress test  7/13  . Transesophageal echocardiogram    . Coronary artery bypass graft  02/29/2012    Procedure: CORONARY ARTERY BYPASS GRAFTING (CABG);  Surgeon: Rexene Alberts, MD;  Location: [facility];  Service: Open Heart Surgery;  Laterality: N/A;  times three using Left Internal Mammary  Artery and Right Greater Saphenous Vein Graft Harvested Endoscopically  . Endarterectomy  02/29/2012    Procedure: ENDARTERECTOMY CAROTID;  Surgeon: Angelia Mould, MD;  Location: [facility] OR;  Service: Vascular;  Laterality: Right;  . Carotid endarterectomy    . Left heart catheterization with coronary angiogram N/A 02/20/2012    Procedure: LEFT HEART CATHETERIZATION WITH CORONARY ANGIOGRAM;  Surgeon: Troy Sine, MD;  Location: [facility] CATH LAB;  Service: Cardiovascular;  Laterality: N/A;    His Family History Is Significant For: Family History  Problem Relation Age of Onset  . Hypertension Mother   . Healthy Sister   . Healthy Brother   . Healthy Son   . Colon cancer Neg Hx   . Heart attack Father   . Heart disease Father     Before age 22  . Hyperlipidemia Father   . Hypertension Father   . Prostate cancer Father     His Social History Is Significant For: Social History   Social History  . Marital Status: Married    Spouse Name: Velva Harman  . Number of Children: 1  . Years of Education: 12   Occupational History  .      retired   Social History Main Topics  . Smoking status: Former Smoker    Types: Cigarettes    Quit date: 09/04/2001  . Smokeless tobacco: Never Used     Comment: smoked 37yr, smoked 1/2 pack a day  . Alcohol Use: 1.8 - 2.4 oz/week    3-4 Cans of beer per week     Comment: social- beer , not on regular basis  . Drug Use: No  . Sexual Activity: Not Asked   Other Topics Concern  . None   Social History Narrative   Consumes 2-3 cups of caffeine daily    His Allergies Are:  No Known Allergies:   His Current Medications Are:  Outpatient Encounter Prescriptions as of 04/28/2015  Medication Sig  . aspirin EC 81 MG tablet Take 81 mg by mouth daily.  .Marland Kitchenatorvastatin (LIPITOR) 20 MG tablet Take 1 tablet (20 mg total) by mouth daily at 6 PM.  . Esomeprazole Magnesium (NEXIUM PO) OTC  . folic acid (FOLVITE) 4627MCG tablet Take 400 mcg by mouth daily.  .  metoprolol (LOPRESSOR)  50 MG tablet Take 1 tablet (50 mg total) by mouth 2 (two) times daily.  . Misc Natural Products (OSTEO BI-FLEX ADV DOUBLE ST) TABS Take 1 tablet by mouth 2 (two) times daily.   . Multiple Vitamins-Minerals (PRESERVISION AREDS 2 PO) Take by mouth.  . Omega-3 Fatty Acids (FISH OIL) 1000 MG CPDR Take 1,000 mg by mouth 2 (two) times daily.   Marland Kitchen omeprazole (PRILOSEC) 20 MG capsule Take 20 mg by mouth daily.  . Psyllium (METAMUCIL PO) Take 3 capsules by mouth daily.   . ramipril (ALTACE) 10 MG capsule Take 1 capsule by mouth daily.  Marland Kitchen rOPINIRole (REQUIP) 0.25 MG tablet 1 pill nightly x 1 week, then 2 pills nightly x 1 w, then 3 pills nightly x 1 w, then 4 pills nightly thereafter. 90-120 min before bedtime.  Marland Kitchen SYNTHROID 100 MCG tablet   . zolpidem (AMBIEN) 10 MG tablet Take 10 mg by mouth at bedtime as needed for sleep.  . [DISCONTINUED] Multiple Vitamin (MULTIVITAMIN WITH MINERALS) TABS Take 1 tablet by mouth daily.   No facility-administered encounter medications on file as of 04/28/2015.  :  Review of Systems:  Out of a complete 14 point review of systems, all are reviewed and negative with the exception of these symptoms as listed below:   Review of Systems  Neurological:       Patient states that he is able to tolerate Requip well, but does not feel like it is helping. He is taking 4 tabs a day.     Objective:  Neurologic Exam  Physical Exam Physical Examination:   Filed Vitals:   04/28/15 1320  BP: 138/82  Pulse: 72  Resp: 18    General Examination: The patient is a very pleasant 74 y.o. male in no acute distress. He appears well-developed and well-nourished and well groomed. He is in good spirits today.  HEENT: Normocephalic, atraumatic, pupils are equal, round and reactive to light and accommodation. He is status post cataract surgeries. Extraocular tracking is good without limitation to gaze excursion or nystagmus noted. Normal smooth pursuit is noted.  Hearing is grossly intact. Face is symmetric with normal facial animation and normal facial sensation. Speech is clear with no dysarthria noted. There is no hypophonia. There is no lip, neck/head, jaw or voice tremor. Neck is supple with full range of passive and active motion. There are no carotid bruits on auscultation. Oropharynx exam reveals: moderate mouth dryness, adequate dental hygiene and mild airway crowding. Mallampati is class II. Tongue protrudes centrally and palate elevates symmetrically.   Chest: Clear to auscultation without wheezing, rhonchi or crackles noted. His sternotomy scar is unremarkable. He's not particularly tender around it at this time.   Heart: S1+S2+0, regular and normal without murmurs, rubs or gallops noted.   Abdomen: Soft, non-tender and non-distended with normal bowel sounds appreciated on auscultation.  Extremities: There is no pitting edema in the distal lower extremities bilaterally.   Skin: Warm and dry without trophic changes noted. There are no varicose veins. toes are mildly cold.   Musculoskeletal: exam reveals no obvious joint deformities, tenderness or joint swelling or erythema except fo mild arthritic changes in both hands.    Neurologically:  Mental status: The patient is awake, alert and oriented in all 4 spheres. His immediate and remote memory, attention, language skills and fund of knowledge are appropriate. There is no evidence of aphasia, agnosia, apraxia or anomia. Speech is clear with normal prosody and enunciation. Thought process is linear. Mood  is normal and affect is normal.  Cranial nerves II - XII are as described above under HEENT exam. In addition: shoulder shrug is normal with equal shoulder height noted. Motor exam: Normal bulk, strength and tone is noted. There is no drift, tremor or rebound. Romberg is negative. Reflexes are 2+ throughout. Babinski: Toes are flexor bilaterally. Fine motor skills and coordination: intact with  normal finger taps, normal hand movements, normal rapid alternating patting, normal foot taps and normal foot agility.  Cerebellar testing: No dysmetria or intention tremor on finger to nose testing. Heel to shin is unremarkable bilaterally. There is no truncal or gait ataxia.  Sensory exam: intact to light touch, pinprick, vibration, temperature sense in the upper and lower extremities.  Gait, station and balance: He stands easily. No veering to one side is noted. No leaning to one side is noted. Posture is age-appropriate and stance is narrow based. Gait shows normal stride length and normal pace. No problems turning are noted. He turns en bloc. Tandem walk is slightly difficult for him, unchanged from before.   Assessment and Plan:   In summary, [name] [name] is a very pleasant 74 year old gentleman with an underlying medical history of high-grade right carotid artery stenosis and coronary artery disease, status post MI, status post CABG (3 vessel combined with right CEA on 02/29/2012), CHF, hypertension, hyperlipidemia, reflux disease, hypothyroidism (s/p thyroidectomy), obstructive sleep apnea on CPAP therapy,  and pre-diabetes, who presents for follow-up consultation of his lower extremity paresthesias and PLMD as evident by his sleep study in April 2016. Thankfully, he does not have worsening of his symptoms. His exam is stable. We tried him on Requip for restless legs symptoms and PLMD at low-dose him a currently at 1 mg each night, and while he has been able to tolerate this he has not noticed any significant improvement in his symptoms. I think we have room to push the dose. To that end, I have asked him to increase it to 1.5 mg each night. We can further increase it from there. He can use up to 0.25 mg pills and I also placed a new prescription for Requip generic 0.5 mg strength, 3 pills at night which he can fill once he has used up his current pills. For now, he will take 0.25 mg 6 pills each  night for a total of 1.5 mg. I would like to hear back from him in about a month or 6 weeks for an update. At that time we can consider increasing the dose to 2 mg her continue with the current dose. He is in agreement. Prior workup for his symptoms has included blood work, iron studies, EMG and nerve conduction studies, none of which showed any revealing test results. I would like to see him back in 3-4 months, sooner if needed. I answered all his questions today and he was in agreement.  I spent 20 minutes in total face-to-face time with the patient, more than 50% of which was spent in counseling and coordination of care, reviewing test results, reviewing medication and discussing or reviewing the diagnosis of RLS/PLMD, paresthesias, the prognosis and treatment options.

## 2015-04-28 NOTE — Patient Instructions (Addendum)
We have room to increase your ropinirole to 1.5 mg each night, use up your 0.25 mg pills: take 6 pills each night, then, when you fill your next prescription, you will use 0.5 mg pills: take 3 pills each night.   Call in about 1 month or 6 weeks for an update and talk to my nurse, Leighton Parody, and we will see if we should stay at the dose of 1.5 mg or go up to 2 mg next.   Follow up in 4 months with me.

## 2015-05-03 DIAGNOSIS — J018 Other acute sinusitis: Secondary | ICD-10-CM | POA: Diagnosis not present

## 2015-05-20 DIAGNOSIS — H40013 Open angle with borderline findings, low risk, bilateral: Secondary | ICD-10-CM | POA: Diagnosis not present

## 2015-05-24 DIAGNOSIS — H6521 Chronic serous otitis media, right ear: Secondary | ICD-10-CM | POA: Diagnosis not present

## 2015-05-24 DIAGNOSIS — J310 Chronic rhinitis: Secondary | ICD-10-CM | POA: Diagnosis not present

## 2015-07-13 DIAGNOSIS — H900 Conductive hearing loss, bilateral: Secondary | ICD-10-CM | POA: Diagnosis not present

## 2015-07-13 DIAGNOSIS — H6521 Chronic serous otitis media, right ear: Secondary | ICD-10-CM | POA: Diagnosis not present

## 2015-08-04 ENCOUNTER — Other Ambulatory Visit: Payer: Self-pay | Admitting: Neurology

## 2015-08-13 DIAGNOSIS — G4733 Obstructive sleep apnea (adult) (pediatric): Secondary | ICD-10-CM | POA: Diagnosis not present

## 2015-08-26 ENCOUNTER — Encounter: Payer: Self-pay | Admitting: Cardiology

## 2015-08-26 ENCOUNTER — Other Ambulatory Visit: Payer: Self-pay | Admitting: *Deleted

## 2015-08-26 ENCOUNTER — Ambulatory Visit (INDEPENDENT_AMBULATORY_CARE_PROVIDER_SITE_OTHER): Payer: Commercial Managed Care - HMO | Admitting: Cardiology

## 2015-08-26 VITALS — BP 122/80 | HR 62 | Ht 73.0 in | Wt 214.0 lb

## 2015-08-26 DIAGNOSIS — I6523 Occlusion and stenosis of bilateral carotid arteries: Secondary | ICD-10-CM

## 2015-08-26 DIAGNOSIS — E785 Hyperlipidemia, unspecified: Secondary | ICD-10-CM | POA: Diagnosis not present

## 2015-08-26 DIAGNOSIS — I10 Essential (primary) hypertension: Secondary | ICD-10-CM

## 2015-08-26 DIAGNOSIS — I2510 Atherosclerotic heart disease of native coronary artery without angina pectoris: Secondary | ICD-10-CM

## 2015-08-26 MED ORDER — ATORVASTATIN CALCIUM 80 MG PO TABS: 80.0000 mg | ORAL_TABLET | Freq: Every day | ORAL | Status: DC

## 2015-08-26 NOTE — Patient Instructions (Signed)
Your physician wants you to follow-up in: 1 year with Dr Bryna Colander will receive a reminder letter in the mail two months in advance. If you don't receive a letter, please call our office to schedule the follow-up appointment.    INCREASE Lipitor to 80 mg daily   Take al your other medications the same    If you need a refill on your cardiac medications before your next appointment, please call your pharmacy.     Thank you for choosing [facility] !

## 2015-08-26 NOTE — Progress Notes (Signed)
Patient ID: [name] [name], male   DOB: [birth year], 75 y.o.   MRN: [id number]    PCP: Dr Delphina Cahill Clinical Summary Mr. Malley is a 75 y.o.male seen today for follow up of the following medical problems.   1. CAD  - prior 3 vessel CABG July 2013, LVEF around that time by LV gram was 55%  - has had chronic MSK chest pain since his CABG that is unchanged  - denies any chest pain. No SOB or DOE - compliant with meds     2. Carotid stenosis  - prior right CEA July 2013  - followed by vascular surgery   3. HTN  - does not check regularly  - compliant with meds   4. Hyperlipidemia  - 03/2013: TC TG 108 HDL 41 LDL 72  - compliant with lipitor  5. OSA  - compliant CPAP Past Medical History  Diagnosis Date  . Hypertension   . Hyperlipidemia   . Wears glasses     or contacts  . Deviated septum   . Thyroid nodule   . GERD (gastroesophageal reflux disease)   . Heart attack   . MI (myocardial infarction) 1997  . 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 PONV (postoperative nausea and vomiting)     has in past, no problems after most recent surgery  . Hypothyroidism   . Shortness of breath     with exertion  . Sleep apnea     CPAP, sleep study at [facility]  . 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 S/P carotid endarterectomy 02/29/2012    Right CEA for asymptomatic severe right ICA stenosis   . CHF (congestive heart failure)   . CAD (coronary artery disease)   . CAD (coronary artery disease) 12/04/2011    Left Main Disease with 3- vessel CAD   . CAD (coronary artery disease)     seeing Dr Harl Bowie  . Dyslipidemia      No Known Allergies   Current Outpatient Prescriptions  Medication Sig Dispense Refill  . aspirin EC 81 MG tablet Take 81 mg by mouth daily.    Marland Kitchen atorvastatin (LIPITOR) 20 MG tablet Take 1 tablet (20 mg total) by mouth daily at 6 PM. 30 tablet 1  .  Esomeprazole Magnesium (NEXIUM PO) OTC    . folic acid (FOLVITE) [id number] MCG tablet Take 400 mcg by mouth daily.    . metoprolol (LOPRESSOR) 50 MG tablet Take 1 tablet (50 mg total) by mouth 2 (two) times daily. 180 tablet 1  . Misc Natural Products (OSTEO BI-FLEX ADV DOUBLE ST) TABS Take 1 tablet by mouth 2 (two) times daily.     . Multiple Vitamins-Minerals (PRESERVISION AREDS 2 PO) Take by mouth.    . Omega-3 Fatty Acids (FISH OIL) 1000 MG CPDR Take 1,000 mg by mouth 2 (two) times daily.     Marland Kitchen omeprazole (PRILOSEC) 20 MG capsule Take 20 mg by mouth daily.    . Psyllium (METAMUCIL PO) Take 3 capsules by mouth daily.     . ramipril (ALTACE) 10 MG capsule Take 1 capsule by mouth daily.    Marland Kitchen rOPINIRole (REQUIP) 0.5 MG tablet TAKE 3 TABLET BY MOUTH AT BEDTIME. 90 tablet 0  . SYNTHROID 100 MCG tablet     . zolpidem (AMBIEN) 10 MG tablet Take 10 mg by mouth at bedtime  as needed for sleep.     No current facility-administered medications for this visit.     Past Surgical History  Procedure Laterality Date  . Coronary stent placement    . Nasal septum surgery    . Totol thyroidectomy  02/06/2011  . Foot surgery  06/30/11    artificial joint in big toe right foot   . Colonoscopy  15-20years and 12/04/11    Done at [facility]  . Upper gastrointestinal endoscopy  35-40 years ago    [facility]  . Colonoscopy  12/11/2011    Procedure: COLONOSCOPY;  Surgeon: Rogene Houston, MD;  Location: [facility];  Service: Endoscopy;  Laterality: N/A;  830  . Cardiac catheterization      02/22/12  . Adenoidectomy    . Cardiovascular stress test  7/13  . Transesophageal echocardiogram    . Coronary artery bypass graft  02/29/2012    Procedure: CORONARY ARTERY BYPASS GRAFTING (CABG);  Surgeon: Rexene Alberts, MD;  Location: [place];  Service: Open Heart Surgery;  Laterality: N/A;  times three using Left Internal Mammary Artery and Right Greater Saphenous Vein Graft Harvested Endoscopically  .  Endarterectomy  02/29/2012    Procedure: ENDARTERECTOMY CAROTID;  Surgeon: Angelia Mould, MD;  Location: [facility] OR;  Service: Vascular;  Laterality: Right;  . Carotid endarterectomy    . Left heart catheterization with coronary angiogram N/A 02/20/2012    Procedure: LEFT HEART CATHETERIZATION WITH CORONARY ANGIOGRAM;  Surgeon: Troy Sine, MD;  Location: [facility] CATH LAB;  Service: Cardiovascular;  Laterality: N/A;     No Known Allergies    Family History  Problem Relation Age of Onset  . Hypertension Mother   . Healthy Sister   . Healthy Brother   . Healthy Son   . Colon cancer Neg Hx   . Heart attack Father   . Heart disease Father     Before age 61  . Hyperlipidemia Father   . Hypertension Father   . Prostate cancer Father      Social History Mr. Steinbacher reports that he quit smoking about 13 years ago. His smoking use included Cigarettes. He has never used smokeless tobacco. Mr. Kawalec reports that he drinks about 1.8 - 2.4 oz of alcohol per week.   Review of Systems CONSTITUTIONAL: No weight loss, fever, chills, weakness or fatigue.  HEENT: Eyes: No visual loss, blurred vision, double vision or yellow sclerae.No hearing loss, sneezing, congestion, runny nose or sore throat.  SKIN: No rash or itching.  CARDIOVASCULAR: per hpi RESPIRATORY: No shortness of breath, cough or sputum.  GASTROINTESTINAL: No anorexia, nausea, vomiting or diarrhea. No abdominal pain or blood.  GENITOURINARY: No burning on urination, no polyuria NEUROLOGICAL: No headache, dizziness, syncope, paralysis, ataxia, numbness or tingling in the extremities. No change in bowel or bladder control.  MUSCULOSKELETAL: No muscle, back pain, joint pain or stiffness.  LYMPHATICS: No enlarged nodes. No history of splenectomy.  PSYCHIATRIC: No history of depression or anxiety.  ENDOCRINOLOGIC: No reports of sweating, cold or heat intolerance. No polyuria or polydipsia.  Marland Kitchen   Physical Examination Filed  Vitals:   08/26/15 1119  BP: 122/80  Pulse: 62   Filed Vitals:   08/26/15 1119  Height: 6\' 1"  (1.854 m)  Weight: 214 lb (97.07 kg)    Gen: resting comfortably, no acute distress HEENT: no scleral icterus, pupils equal round and reactive, no palptable cervical adenopathy,  CV: RRR, no m/r/g, no jvd Resp: Clear to auscultation  bilaterally GI: abdomen is soft, non-tender, non-distended, normal bowel sounds, no hepatosplenomegaly MSK: extremities are warm, no edema.  Skin: warm, no rash Neuro:  no focal deficits Psych: appropriate affect   Diagnostic Studies 02/2012 Cath  1. Normal left ventricular function with an ejection fraction of at  least 55% with suggestion of minimal posterobasal inferior  hypocontractility.  2. Extensive coronary calcification involving the left main, left  anterior descending artery, left circumflex, and right coronary  arteries.  3. Probable ostial tapering of the left main to 60-70% with evidence  for AO pressure dampening by 10-15 mm concordant with ostial  stenosis.  4. 40% proximal circumflex with 70% circumflex marginal 1 stenosis.  5. Total occlusion of the right coronary artery at the ostium with  extensive collateralization to the posterior descending artery  vessel via the left coronary circulation.   03/2013 Echo  LVEF 55-60%, no WMAs, grade I diastolic dysfunction, mild MR   02/2013 Carotid US  Patent right CEA, <40% stenosis on left.   04/2015 Carotid US Patent RICA/CEA, LICA [id number]   Assessment and Plan  1. CAD  - no current symptoms  - continue risk factor modification and secondary prevention .  2. Carotid stenosis  - continue follow up with vascular   3. HTN  - at goal, continue current meds   4. Hyperlipidemia  - given history of CAD will try changin to high dose statin atorva 80mg  daily.    F/u 1 year   Arnoldo Lenis, M.D.

## 2015-08-27 DIAGNOSIS — I10 Essential (primary) hypertension: Secondary | ICD-10-CM | POA: Diagnosis not present

## 2015-08-27 DIAGNOSIS — R7301 Impaired fasting glucose: Secondary | ICD-10-CM | POA: Diagnosis not present

## 2015-08-30 ENCOUNTER — Ambulatory Visit: Payer: Commercial Managed Care - HMO | Admitting: Neurology

## 2015-08-30 ENCOUNTER — Telehealth: Payer: Self-pay

## 2015-08-30 NOTE — Telephone Encounter (Signed)
Patient did not show to appt today  

## 2015-08-31 ENCOUNTER — Encounter: Payer: Self-pay | Admitting: Neurology

## 2015-09-01 DIAGNOSIS — F5101 Primary insomnia: Secondary | ICD-10-CM | POA: Diagnosis not present

## 2015-09-01 DIAGNOSIS — E782 Mixed hyperlipidemia: Secondary | ICD-10-CM | POA: Diagnosis not present

## 2015-09-01 DIAGNOSIS — G2581 Restless legs syndrome: Secondary | ICD-10-CM | POA: Diagnosis not present

## 2015-09-01 DIAGNOSIS — E039 Hypothyroidism, unspecified: Secondary | ICD-10-CM | POA: Diagnosis not present

## 2015-09-01 DIAGNOSIS — R7301 Impaired fasting glucose: Secondary | ICD-10-CM | POA: Diagnosis not present

## 2015-09-01 DIAGNOSIS — I10 Essential (primary) hypertension: Secondary | ICD-10-CM | POA: Diagnosis not present

## 2015-09-07 ENCOUNTER — Telehealth: Payer: Self-pay

## 2015-09-07 NOTE — Telephone Encounter (Signed)
LM for patient to call back to reschedule appt. Dr. Rexene Alberts will be out sick. He can reschedule with Dr. Rexene Alberts or NP.

## 2015-09-08 ENCOUNTER — Ambulatory Visit: Payer: Commercial Managed Care - HMO | Admitting: Neurology

## 2015-09-22 ENCOUNTER — Ambulatory Visit (INDEPENDENT_AMBULATORY_CARE_PROVIDER_SITE_OTHER): Payer: Commercial Managed Care - HMO | Admitting: Neurology

## 2015-09-22 ENCOUNTER — Encounter: Payer: Self-pay | Admitting: Neurology

## 2015-09-22 VITALS — BP 156/82 | HR 68 | Resp 18 | Ht 73.0 in | Wt 213.0 lb

## 2015-09-22 DIAGNOSIS — G629 Polyneuropathy, unspecified: Secondary | ICD-10-CM | POA: Diagnosis not present

## 2015-09-22 DIAGNOSIS — R202 Paresthesia of skin: Secondary | ICD-10-CM

## 2015-09-22 DIAGNOSIS — G2581 Restless legs syndrome: Secondary | ICD-10-CM

## 2015-09-22 DIAGNOSIS — G4761 Periodic limb movement disorder: Secondary | ICD-10-CM

## 2015-09-22 MED ORDER — GABAPENTIN 100 MG PO CAPS: ORAL_CAPSULE | ORAL | Status: DC

## 2015-09-22 NOTE — Patient Instructions (Addendum)
Let's not restart the Requip at this time.  For your leg discomfort, restless legs and leg twitching in your sleep, let's try you on Neurontin (gabapentin) 100 mg strength: Take 1 pill nightly at bedtime for 1 week, then 2 pills nightly for 1 week, then 3 pills each night thereafter. The most common side effects reported are sedation or sleepiness. Rare side effects include balance problems, confusion.  Call in the interim, about 3-4 weeks from now for an update.  Beverlee Nims is my nurse.

## 2015-09-22 NOTE — Progress Notes (Signed)
Subjective:    Patient ID: [name] [name] is a 75 y.o. male.  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 The patient is unaccompanied today. He missed an appointment on 08/30/2015. I last saw him on 04/28/2015, at which time he reported that he was tolerating ropinirole 1 mg each night but had not had much in the way of symptom change. He had no side effects. He had a routine carotid Doppler test under vascular surgery on 04/16/2015 which showed no significant change from prior and less than 40% left ICA stenosis, which was deemed stable. He had no evidence of restenosis on the right and he is status post right carotid endarterectomy in 2013. I suggested we increase ropinirole to 1.5 mg each night. I provided him with a new prescription at the time.  Today, 09/22/2015: He reports that he ran out of the Requip some 3 weeks ago and has been off it since then. He has not noticed any flareup in his leg pain, tingling sensation, restless leg sensation since then. When he increase the Requip to 1.5 mg each night, which was 3 pills, he did not notice any improvement. He had some interim problems with recurrent ear infections and finally had to have a year to placed on the right. He sees Dr. Lucia Gaskins for this. He had blood work on 08/27/2015 and I reviewed the results. CBC with differential was unremarkable, total cholesterol 128, LDL 64, A1c was 6.0. He was  told to increase his Lipitor to 80 mg once daily per cardiologist but he started having joint pain with a higher dose and went back to 20 mg once daily. He has some trouble sleeping at night. Mostly, this is difficulty falling asleep.   Previously:  I saw him on 01/20/2015, at which time he reported simply forgetting about his appointment in May. He had unchanged symptoms in his legs and his feet. Symptoms were more bothersome at night but he felt that he had no significant progression since I saw him in April 2016. We talked about his sleep test results at the time and prior test results including blood work. He denied a diagnosis of anemia or iron deficiency in the past. I suggested that we start him on a trial of low-dose Requip, 0.25 mg strength with gradual titration to up to 1 mg each night for restless leg symptoms and PLMD. We checked blood work including iron studies and hemoglobin and cell count. Findings were unremarkable and we called him with his test results.  I saw him on 11/23/2014, at which time he reported tingling in the feet and some in his fingers. Symptoms were somewhat worse at night when he was laying in bed. He endorsed some restless leg symptoms. He had stopped his B12 supplement. His symptoms have not progressed since his last visit. He was having trouble sleeping. He was previously diagnosed with obstructive sleep apnea and was given CPAP treatment. He was using CPAP but felt not rested and I suggested that he come back for sleep  study. He had a baseline sleep study on 12/06/2014 and I went over his test results with him in detail today. Sleep efficiency was 55% with a normal sleep latency of 16.5 minutes with wake after sleep onset of 171.5 minutes with moderate sleep fragmentation noted. His arousal index was elevated, secondary to periodic leg movements primarily. He had increased percentages of stage I and stage II sleep, absence of slow-wave sleep and a decreased percentage of  REM sleep with a mildly prolonged REM latency. He had severe periodic leg movements with significant arousals. His PLM index was 84.2 per hour, the associated arousal index 11.2 per hour. He had mild to moderate snoring. Total AHI was normal at 3.7 per hour, rising to 7.6 per hour in the supine position. Average oxygen saturation was 93%, nadir was 86%. Time below 88% saturation was 1 minutes and 12 seconds.  Of note, he no showed for an appointment on 12/22/2014.  I first met him on 09/24/2014 at the request of his primary care physician at which time he reported symptoms of tingling and burning sensation in both feet which started after his open-heart surgery in July 2013. He felt symptoms were progressive. I suggested workup in the form of blood work and EMG nerve conduction testing. Blood work from 09/24/2014 showed normal ESR, normal CRP, normal rheumatoid factor, normal ANA, normal B1, B6, increased B12 level, normal IFE and PE, normal copper, normal HTLV antibodies and normal folate. EMG and nerve conduction test from 10/05/2014 was reported as normal. We called him with the test results.  He reports having had open heart surgery 02/2012 and since then has had tingling in both feet and burning sensation, progressive over time. It started in the feet and now seems to have traveled up to the thighs. No weakness, no atrophy, no twitching and symptoms fluctuate as i are milder in the morning when he has some tingling but he may have frank burning pain by the end of the day. He has intermittent tingling in his fingers as well. He broke his right the toe and had surgery on it. His toes stay cold. He also has some intermittent abnormal sensation and metallic taste since his heart surgery. This improved. He has soreness around his scar from the open heart surgery. It bothers him to wear her seatbelt as it causes him to feel very sore when the seatbelt rubs. For symptomatic treatment of his nerve related pain he  was tried on Lyrica 50 mg each night for about a month but it did not help and he had insomnia from it. His cardiologist suggested ibuprofen 800 mg twice daily for a few days for the chest wall pain but he did not think it helped and he did not try it consistently. He is compliant with his CPAP machine. His latest hemoglobin A1c was 5.9. He had his thyroid function tested and sees her endocrinologist. He has no family history of neuropathy and no strong family history of diabetes. He quit smoking in 95. He drinks 2-3 cups of coffee per day and has reduced his soda intake. He drinks beer occasionally. He used to work in a burglary but has never been a heavy drinker and never drank liquor. His balance has not been as good for the past couple of years. Thankfully he has not fallen. He stays active and works around the house.    His Past Medical History Is Significant For: Past Medical History  Diagnosis Date  . Hypertension   .  Hyperlipidemia   . Wears glasses     or contacts  . Deviated septum   . Thyroid nodule   . GERD (gastroesophageal reflux disease)   . Heart attack ([place])   . MI (myocardial infarction) ([place]) 1997  . 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 PONV (postoperative nausea and vomiting)     has in past, no problems after most recent surgery  . Hypothyroidism   . Shortness of breath     with exertion  . Sleep apnea     CPAP, sleep study at [facility]  . 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 S/P carotid endarterectomy 02/29/2012    Right CEA for asymptomatic severe right ICA stenosis   . CHF (congestive heart failure) ([place])   . CAD (coronary artery disease)   . CAD (coronary artery disease) 12/04/2011    Left Main Disease with 3- vessel CAD   . CAD (coronary artery disease)     seeing Dr Harl Bowie  . Dyslipidemia     His Past Surgical History Is Significant For: Past Surgical History   Procedure Laterality Date  . Coronary stent placement    . Nasal septum surgery    . Totol thyroidectomy  02/06/2011  . Foot surgery  06/30/11    artificial joint in big toe right foot   . Colonoscopy  15-20years and 12/04/11    Done at [facility]  . Upper gastrointestinal endoscopy  35-40 years ago    [facility]  . Colonoscopy  12/11/2011    Procedure: COLONOSCOPY;  Surgeon: Rogene Houston, MD;  Location: [facility];  Service: Endoscopy;  Laterality: N/A;  830  . Cardiac catheterization      02/22/12  . Adenoidectomy    . Cardiovascular stress test  7/13  . Transesophageal echocardiogram    . Coronary artery bypass graft  02/29/2012    Procedure: CORONARY ARTERY BYPASS GRAFTING (CABG);  Surgeon: Rexene Alberts, MD;  Location: [place];  Service: Open Heart Surgery;  Laterality: N/A;  times three using Left Internal Mammary Artery and Right Greater Saphenous Vein Graft Harvested Endoscopically  . Endarterectomy  02/29/2012    Procedure: ENDARTERECTOMY CAROTID;  Surgeon: Angelia Mould, MD;  Location: [facility] OR;  Service: Vascular;  Laterality: Right;  . Carotid endarterectomy    . Left heart catheterization with coronary angiogram N/A 02/20/2012    Procedure: LEFT HEART CATHETERIZATION WITH CORONARY ANGIOGRAM;  Surgeon: Troy Sine, MD;  Location: [facility] CATH LAB;  Service: Cardiovascular;  Laterality: N/A;    His Family History Is Significant For: Family History  Problem Relation Age of Onset  . Hypertension Mother   . Healthy Sister   . Healthy Brother   . Healthy Son   . Colon cancer Neg Hx   . Heart attack Father   . Heart disease Father     Before age 28  . Hyperlipidemia Father   . Hypertension Father   . Prostate cancer Father     His Social History Is Significant For: Social History   Social History  . Marital Status: Married    Spouse Name: Velva Harman  . Number of Children: 1  . Years of Education: 12   Occupational History  .      retired   Social  History Main Topics  . Smoking status: Former Smoker  Types: Cigarettes    Quit date: 09/04/2001  . Smokeless tobacco: Never Used     Comment: smoked 66yrs, smoked 1/2 pack a day  . Alcohol Use: 1.8 - 2.4 oz/week    3-4 Cans of beer per week     Comment: social- beer , not on regular basis  . Drug Use: No  . Sexual Activity: Not Asked   Other Topics Concern  . None   Social History Narrative   Consumes 2-3 cups of caffeine daily    His Allergies Are:  No Known Allergies:   His Current Medications Are:  Outpatient Encounter Prescriptions as of 09/22/2015  Medication Sig  . aspirin EC 81 MG tablet Take 81 mg by mouth daily.  Marland Kitchen atorvastatin (LIPITOR) 20 MG tablet   . Esomeprazole Magnesium (NEXIUM PO) OTC  . folic acid (FOLVITE) 400 MCG tablet Take 400 mcg by mouth daily.  . metoprolol (LOPRESSOR) 50 MG tablet Take 1 tablet (50 mg total) by mouth 2 (two) times daily.  . Misc Natural Products (OSTEO BI-FLEX ADV DOUBLE ST) TABS Take 1 tablet by mouth 2 (two) times daily.   . Multiple Vitamins-Minerals (PRESERVISION AREDS 2 PO) Take by mouth.  . Omega-3 Fatty Acids (FISH OIL) 1000 MG CPDR Take 1,000 mg by mouth 2 (two) times daily.   Marland Kitchen omeprazole (PRILOSEC) 20 MG capsule Take 20 mg by mouth daily.  . Psyllium (METAMUCIL PO) Take 3 capsules by mouth daily.   . ramipril (ALTACE) 10 MG capsule Take 1 capsule by mouth daily.  Marland Kitchen rOPINIRole (REQUIP) 0.5 MG tablet TAKE 3 TABLET BY MOUTH AT BEDTIME.  . SYNTHROID 100 MCG tablet   . zolpidem (AMBIEN) 10 MG tablet Take 10 mg by mouth at bedtime as needed for sleep.  . [DISCONTINUED] atorvastatin (LIPITOR) 80 MG tablet Take 1 tablet (80 mg total) by mouth daily.   No facility-administered encounter medications on file as of 09/22/2015.  :  Review of Systems:  Out of a complete 14 point review of systems, all are reviewed and negative with the exception of these symptoms as listed below:  Review of Systems  Neurological:       No new  concerns. States that he has not felt any different with the increase in Requip.     Objective:  Neurologic Exam  Physical Exam Physical Examination:   Filed Vitals:   09/22/15 1625  BP: 156/82  Pulse: 68  Resp: 18    General Examination: The patient is a very pleasant 75 y.o. male in no acute distress. He appears well-developed and well-nourished and well groomed. He is in good spirits today.  HEENT: Normocephalic, atraumatic, pupils are equal, round and reactive to light and accommodation. He is status post cataract surgeries. Extraocular tracking is good without limitation to gaze excursion or nystagmus noted. Normal smooth pursuit is noted. Hearing is grossly intact. He has a right ear tube and L ear canal is impacted with cerumen. Face is symmetric with normal facial animation and normal facial sensation. Speech is clear with no dysarthria noted. There is no hypophonia. There is no lip, neck/head, jaw or voice tremor. Neck is supple with full range of passive and active motion. There are no carotid bruits on auscultation. Oropharynx exam reveals: moderate mouth dryness, adequate dental hygiene and mild airway crowding. Mallampati is class II. Tongue protrudes centrally and palate elevates symmetrically.   Chest: Clear to auscultation without wheezing, rhonchi or crackles noted. His sternotomy scar is unremarkable. He's not particularly  tender around it at this time.   Heart: S1+S2+0, regular and normal without murmurs, rubs or gallops noted.   Abdomen: Soft, non-tender and non-distended with normal bowel sounds appreciated on auscultation.  Extremities: There is no pitting edema in the distal lower extremities bilaterally.   Skin: Warm and dry without trophic changes noted. There are no varicose veins. toes are mildly cold.   Musculoskeletal: exam reveals no obvious joint deformities, tenderness or joint swelling or erythema except fo mild arthritic changes in both hands.     Neurologically:  Mental status: The patient is awake, alert and oriented in all 4 spheres. His immediate and remote memory, attention, language skills and fund of knowledge are appropriate. There is no evidence of aphasia, agnosia, apraxia or anomia. Speech is clear with normal prosody and enunciation. Thought process is linear. Mood is normal and affect is normal.  Cranial nerves II - XII are as described above under HEENT exam. In addition: shoulder shrug is normal with equal shoulder height noted. Motor exam: Normal bulk, strength and tone is noted. There is no drift, tremor or rebound. Romberg is negative. Reflexes are 1+ throughout. Fine motor skills and coordination: intact with normal finger taps, normal hand movements, normal rapid alternating patting, normal foot taps and normal foot agility.  Cerebellar testing: No dysmetria or intention tremor on finger to nose testing. Heel to shin is unremarkable bilaterally. There is no truncal or gait ataxia.  Sensory exam: intact to light touch in the upper and lower extremities.  Gait, station and balance: He stands easily. No veering to one side is noted. No leaning to one side is noted. Posture is age-appropriate and stance is narrow based. Gait shows normal stride length and normal pace. No problems turning are noted. He turns en bloc. Tandem walk is difficult for him.   Assessment and Plan:   In summary, Rayshon D Gilberti is a very pleasant 75 year old gentleman with an underlying medical history of high-grade right carotid artery stenosis and coronary artery disease, status post MI, status post CABG (3 vessel combined with right CEA on 02/29/2012), CHF, hypertension, hyperlipidemia, reflux disease, hypothyroidism (s/p thyroidectomy), obstructive sleep apnea on CPAP therapy,  and pre-diabetes, who presents for follow-up consultation of his lower extremity paresthesias and discomfort, some restless leg symptoms, worse symptoms at night and evidence  of PLMD on his sleep study from April 2016. When he increased his Requip to 1.5 mg each night he did not notice any improvement in his symptoms. He had no side effects. He ran out of his prescription about 3 weeks ago without flareup of symptoms or any other repercussions. I suggested he stay off of Requip at this time and I suggested a trial of low-dose gabapentin. We will start with 100 mg strength each night and gradually build up to up to 300 mg each night. I talked to him about expectations and side effects and provided written titration instructions. I sent his prescription to his mail order pharmacy. I suggested a 3 month follow-up, sooner if needed. I did ask him to give [place] a phone update in 3-4 weeks from now. He was in agreement. I answered all his questions today. I spent 25 minutes in total face-to-face time with the patient, more than 50% of which was spent in counseling and coordination of care, reviewing test results, reviewing medication and discussing or reviewing the diagnosis of RLS/PLMD, paresthesias, the prognosis and treatment options.

## 2015-09-24 DIAGNOSIS — G4733 Obstructive sleep apnea (adult) (pediatric): Secondary | ICD-10-CM | POA: Diagnosis not present

## 2015-11-24 ENCOUNTER — Telehealth: Payer: Self-pay | Admitting: Neurology

## 2015-11-24 DIAGNOSIS — G2581 Restless legs syndrome: Secondary | ICD-10-CM

## 2015-11-24 DIAGNOSIS — G629 Polyneuropathy, unspecified: Secondary | ICD-10-CM

## 2015-11-24 DIAGNOSIS — R202 Paresthesia of skin: Secondary | ICD-10-CM

## 2015-11-24 DIAGNOSIS — G4761 Periodic limb movement disorder: Secondary | ICD-10-CM

## 2015-11-24 NOTE — Telephone Encounter (Signed)
Patient called, states he was supposed to call back a week or 2 ago to let Dr. Rexene Alberts know how he's doing on gabapentin (NEURONTIN) 100 MG capsule, states he's doing about the same and has taken the medicine as directed. Is aware Dr. Rexene Alberts and Beverlee Nims are out of the office this week.

## 2015-12-01 NOTE — Telephone Encounter (Signed)
LM to call back and discuss further.

## 2015-12-03 NOTE — Telephone Encounter (Signed)
Patient returned Diana's call, is aware Beverlee Nims is out of the office today, will wait for Beverlee Nims to call back when she is back in the office on Monday. Please call [phone].

## 2015-12-06 MED ORDER — GABAPENTIN 400 MG PO CAPS: ORAL_CAPSULE | ORAL | Status: DC

## 2015-12-06 NOTE — Telephone Encounter (Signed)
Please advise patient that as long as he seems to tolerate the medication there is room for increase the neurontin/gabapentin. To that end, I would like to suggest that he increase gabapentin to 400 mg each night, he can use of the current pills of 100 mg strength and take 4 at night. In addition, I entered a prescription to go to [place] for gabapentin 400 mg strength one pill each bedtime when he is ready to fill it. I would like to discuss further next month when he comes back for his follow-up appointment.

## 2015-12-06 NOTE — Addendum Note (Signed)
Addended by: Star Age on: 12/06/2015 04:27 PM   Modules accepted: Orders

## 2015-12-06 NOTE — Telephone Encounter (Signed)
I spoke to patient to f/u on Gabapentin. He is taking 300mg  at night and states that it is not doing anything for him currently. Do you have any recommendations? Or patient states that he can discuss further at appt on 5/31.

## 2015-12-07 NOTE — Telephone Encounter (Signed)
Called and relayed instructions per Dr Rexene Alberts note below. Advised him to keep upcoming appt with Dr Rexene Alberts. He verbalized understanding. He will call with any further questions or concerns.

## 2015-12-21 ENCOUNTER — Encounter: Payer: Self-pay | Admitting: Neurology

## 2016-01-12 ENCOUNTER — Ambulatory Visit: Payer: Commercial Managed Care - HMO | Admitting: Neurology

## 2016-02-14 ENCOUNTER — Ambulatory Visit (INDEPENDENT_AMBULATORY_CARE_PROVIDER_SITE_OTHER): Payer: Commercial Managed Care - HMO | Admitting: Neurology

## 2016-02-14 ENCOUNTER — Encounter: Payer: Self-pay | Admitting: Neurology

## 2016-02-14 VITALS — BP 142/80 | HR 68 | Resp 18 | Ht 73.0 in | Wt 212.0 lb

## 2016-02-14 DIAGNOSIS — G4761 Periodic limb movement disorder: Secondary | ICD-10-CM | POA: Diagnosis not present

## 2016-02-14 DIAGNOSIS — R202 Paresthesia of skin: Secondary | ICD-10-CM

## 2016-02-14 DIAGNOSIS — G2581 Restless legs syndrome: Secondary | ICD-10-CM

## 2016-02-14 DIAGNOSIS — G479 Sleep disorder, unspecified: Secondary | ICD-10-CM

## 2016-02-14 MED ORDER — PRAMIPEXOLE DIHYDROCHLORIDE 0.125 MG PO TABS: ORAL_TABLET | ORAL | Status: DC

## 2016-02-14 NOTE — Patient Instructions (Addendum)
As discussed, we will try you on Mirapex (generic name: pramipexole) 0.125 mg: Take 1 pill each night for 1 week, the 2 pills each night for 1 week, then 3 pills each night thereafter. Common side effects reported are: Sedation, sleepiness, nausea, vomiting, and rare side effects are confusion, hallucinations, swelling in legs, and abnormal behaviors, including impulse control problems, which can manifest as excessive eating, obsessions with food or gambling, or hypersexuality. Side effect are similar to the ones for ropinirole, and since you have virtually no side effects on the ropinirole, you may very well do fine with this new medication.   Please call and update [place] in about 3-4 weeks, leave a message for Beverlee Nims, she will call you back

## 2016-02-14 NOTE — Progress Notes (Signed)
Subjective:    Patient ID: [name] [name] is a 75 y.o. male.  HPI     Interim history:  Mr. 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 The patient is unaccompanied today. I last saw him on 09/22/2015, at which time he reported that he ran out of the Requip some 3 weeks prior and had been off it since then. He did not notice any flareup of his leg pain or tingling or restless leg sensation. He did not think increasing the ropinirole to 1.5 mg each night had helped. He had some interim problems with recurrent infections and saw ENT for this. He had an ear tube placed. He had blood work on 08/27/2015 and I reviewed the results: CBC with differential was unremarkable, total cholesterol 128, LDL 64, A1c was 6.0. He was told to increase his Lipitor to 80 mg once daily per cardiologist but he started having joint pain with the higher dose and went back to 20 mg once daily. He was having some trouble falling asleep. I suggested we start him on gabapentin with gradual titration to up to 300 mg at night. I asked him to give [place] a phone update in about a month. He called on 12/03/2015 and reported that he did not notice any improvement with 300 mg of gabapentin at night. I suggested we increase it to 400 mg each night.   Today, 02/14/2016: He reports not taking the gabapentin for the past month or so. He had no side effects with it but denies any improvement of his paresthesias which can affect  his right upper extremity as well. Symptoms come and go. He has some restlessness at night. Symptoms are not constant but not bothersome to him at times. Going up to 400 mg of gabapentin did not help. He still has some residual ear problems and sees Dr. Lucia Gaskins in ENT for this. He is drinking more water, tries to stay active.   Previously:  He missed an appointment on 08/30/2015. I saw him on 04/28/2015, at which time he reported that he was tolerating ropinirole 1 mg each night but had not had much in the way of symptom change. He had no side effects. He had a routine carotid Doppler test under vascular surgery on 04/16/2015 which showed no significant change from prior and less than 40% left ICA stenosis, which was deemed stable. He had no evidence of restenosis on the right and he is status post right carotid endarterectomy in 2013. I suggested we increase ropinirole to 1.5 mg each night. I provided him with a new prescription at the time.  I saw him on 01/20/2015, at which time he reported simply forgetting about his appointment in May. He had unchanged symptoms in his legs and his feet. Symptoms were more bothersome at night but he felt that he had no significant progression since I saw him in April 2016. We talked about his sleep test results at the time and prior test results including blood work. He denied a diagnosis of anemia or iron deficiency in the past. I suggested that we start  him on a trial of low-dose Requip, 0.25 mg strength with gradual titration to up to 1 mg each night for restless leg symptoms and PLMD. We checked blood work including iron studies and hemoglobin and cell count. Findings were unremarkable and we called him with his test results.  I saw him on 11/23/2014, at which time he reported tingling in the feet and some in his fingers. Symptoms were somewhat worse at night when he was laying in bed. He endorsed some restless leg symptoms. He had stopped his B12 supplement. His  symptoms have not progressed since his last visit. He was having trouble sleeping. He was previously diagnosed with obstructive sleep apnea and was given CPAP treatment. He was using CPAP but felt not rested and I suggested that he come back for sleep study. He had a baseline sleep study on 12/06/2014 and I went over his test results with him in detail today. Sleep efficiency was 55% with a normal sleep latency of 16.5 minutes with wake after sleep onset of 171.5 minutes with moderate sleep fragmentation noted. His arousal index was elevated, secondary to periodic leg movements primarily. He had increased percentages of stage I and stage II sleep, absence of slow-wave sleep and a decreased percentage of REM sleep with a mildly prolonged REM latency. He had severe periodic leg movements with significant arousals. His PLM index was 84.2 per hour, the associated arousal index 11.2 per hour. He had mild to moderate snoring. Total AHI was normal at 3.7 per hour, rising to 7.6 per hour in the supine position. Average oxygen saturation was 93%, nadir was 86%. Time below 88% saturation was 1 minutes and 12 seconds.  Of note, he no showed for an appointment on 12/22/2014.  I first met him on 09/24/2014 at the request of his primary care physician at which time he reported symptoms of tingling and burning sensation in both feet which started after his open-heart surgery in July 2013. He felt symptoms were progressive. I suggested workup in the form of blood work and EMG nerve conduction testing. Blood work from 09/24/2014 showed normal ESR, normal CRP, normal rheumatoid factor, normal ANA, normal B1, B6, increased B12 level, normal IFE and PE, normal copper, normal HTLV antibodies and normal folate. EMG and nerve conduction test from 10/05/2014 was reported as normal. We called him with the test results.  He reports having had open heart surgery 02/2012 and since then has had tingling in both feet and burning sensation,  progressive over time. It started in the feet and now seems to have traveled up to the thighs. No weakness, no atrophy, no twitching and symptoms fluctuate as i are milder in the morning when he has some tingling but he may have frank burning pain by the end of the day. He has intermittent tingling in his fingers as well. He broke his right the toe and had surgery on it. His toes stay cold. He also has some intermittent abnormal sensation and metallic taste since his heart surgery. This improved. He has soreness around his scar from the open heart surgery. It bothers him to wear her seatbelt as it causes him to feel very sore when the seatbelt rubs. For symptomatic treatment of his nerve related pain he was tried on Lyrica 50 mg each night for about a month but it did not help and he had insomnia from it. His cardiologist suggested ibuprofen 800 mg twice daily for a few days for the chest wall pain but he  did not think it helped and he did not try it consistently. He is compliant with his CPAP machine. His latest hemoglobin A1c was 5.9. He had his thyroid function tested and sees her endocrinologist. He has no family history of neuropathy and no strong family history of diabetes. He quit smoking in 95. He drinks 2-3 cups of coffee per day and has reduced his soda intake. He drinks beer occasionally. He has never been a heavy drinker and never drank liquor. His balance has not been as good for the past couple of years. Thankfully he has not fallen. He stays active and works around the house.    His Past Medical History Is Significant For: Past Medical History  Diagnosis Date  . Hypertension   . Hyperlipidemia   . Wears glasses     or contacts  . Deviated septum   . Thyroid nodule   . GERD (gastroesophageal reflux disease)   . Heart attack ([place])   . MI (myocardial infarction) ([place]) 1997  . 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 PONV (postoperative nausea and  vomiting)     has in past, no problems after most recent surgery  . Hypothyroidism   . Shortness of breath     with exertion  . Sleep apnea     CPAP, sleep study at [facility]  . 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 S/P carotid endarterectomy 02/29/2012    Right CEA for asymptomatic severe right ICA stenosis   . CHF (congestive heart failure) ([place])   . CAD (coronary artery disease)   . CAD (coronary artery disease) 12/04/2011    Left Main Disease with 3- vessel CAD   . CAD (coronary artery disease)     seeing Dr Harl Bowie  . Dyslipidemia     His Past Surgical History Is Significant For: Past Surgical History  Procedure Laterality Date  . Coronary stent placement    . Nasal septum surgery    . Totol thyroidectomy  02/06/2011  . Foot surgery  06/30/11    artificial joint in big toe right foot   . Colonoscopy  15-20years and 12/04/11    Done at [facility]  . Upper gastrointestinal endoscopy  35-40 years ago    [facility]  . Colonoscopy  12/11/2011    Procedure: COLONOSCOPY;  Surgeon: Rogene Houston, MD;  Location: [facility];  Service: Endoscopy;  Laterality: N/A;  830  . Cardiac catheterization      02/22/12  . Adenoidectomy    . Cardiovascular stress test  7/13  . Transesophageal echocardiogram    . Coronary artery bypass graft  02/29/2012    Procedure: CORONARY ARTERY BYPASS GRAFTING (CABG);  Surgeon: Rexene Alberts, MD;  Location: [place];  Service: Open Heart Surgery;  Laterality: N/A;  times three using Left Internal Mammary Artery and Right Greater Saphenous Vein Graft Harvested Endoscopically  . Endarterectomy  02/29/2012    Procedure: ENDARTERECTOMY CAROTID;  Surgeon: Angelia Mould, MD;  Location: [facility] OR;  Service: Vascular;  Laterality: Right;  . Carotid endarterectomy    . Left heart catheterization with coronary angiogram N/A 02/20/2012    Procedure: LEFT HEART CATHETERIZATION WITH CORONARY ANGIOGRAM;  Surgeon:  Troy Sine, MD;  Location: [facility] C[facility];  Service: Cardiovascular;  Laterality: N/A;    His Family History Is Significant For: Family History  Problem Relation Age of Onset  . Hypertension Mother   . Healthy Sister   . Healthy Brother   . Healthy Son   . Colon cancer Neg Hx   . Heart attack Father   . Heart disease Father     Before age 52  . Hyperlipidemia Father   . Hypertension Father   . Prostate cancer Father     His Social History Is Significant For: Social History   Social History  . Marital Status: Married    Spouse Name: Velva Harman  . Number of Children: 1  . Years of Education: 12   Occupational History  .      retired   Social History Main Topics  . Smoking status: Former Smoker    Types: Cigarettes    Quit date: 09/04/2001  . Smokeless tobacco: Never Used     Comment: smoked 55yr, smoked 1/2 pack a day  . Alcohol Use: 1.8 - 2.4 oz/week    3-4 Cans of beer per week     Comment: social- beer , not on regular basis  . Drug Use: No  . Sexual Activity: Not Asked   Other Topics Concern  . None   Social History Narrative   Consumes 2-3 cups of caffeine daily    His Allergies Are:  No Known Allergies:   His Current Medications Are:  Outpatient Encounter Prescriptions as of 02/14/2016  Medication Sig  . aspirin EC 81 MG tablet Take 81 mg by mouth daily.  .Marland Kitchenatorvastatin (LIPITOR) 20 MG tablet   . Esomeprazole Magnesium (NEXIUM PO) OTC  . folic acid (FOLVITE) 4628MCG tablet Take 400 mcg by mouth daily.  . metoprolol (LOPRESSOR) 50 MG tablet Take 1 tablet (50 mg total) by mouth 2 (two) times daily.  . Misc Natural Products (OSTEO BI-FLEX ADV DOUBLE ST) TABS Take 1 tablet by mouth 2 (two) times daily.   . Multiple Vitamins-Minerals (PRESERVISION AREDS 2 PO) Take by mouth.  . Omega-3 Fatty Acids (FISH OIL) 1000 MG CPDR Take 1,000 mg by mouth 2 (two) times daily.   .Marland Kitchenomeprazole (PRILOSEC) 20 MG capsule Take 20 mg by mouth daily.  . Psyllium (METAMUCIL PO)  Take 3 capsules by mouth daily.   . ramipril (ALTACE) 10 MG capsule Take 1 capsule by mouth daily.  .Marland KitchenSYNTHROID 100 MCG tablet   . zolpidem (AMBIEN) 10 MG tablet Take 10 mg by mouth at bedtime as needed for sleep.  . [DISCONTINUED] gabapentin (NEURONTIN) 400 MG capsule Take 1 pill each night at bedtime.   No facility-administered encounter medications on file as of 02/14/2016.  :  Review of Systems:  Out of a complete 14 point review of systems, all are reviewed and negative with the exception of these symptoms as listed below:   Review of Systems  Neurological:       Patient states that he was taking Gabapentin 400 mg but it did not help his symptoms. He has not taken it in over a month.      Objective:  Neurologic Exam  Physical Exam Physical Examination:   Filed Vitals:   02/14/16 1555  BP: 142/80  Pulse: 68  Resp: 18   General Examination: The patient is a very pleasant 75y.o. male in no acute distress. He appears well-developed and well-nourished and well groomed. He is in good spirits today.  HEENT: Normocephalic, atraumatic, pupils are equal, round and reactive to light and accommodation. He is status post cataract surgeries. Extraocular tracking is  good without limitation to gaze excursion or nystagmus noted. Normal smooth pursuit is noted. Hearing is grossly intact. Face is symmetric with normal facial animation and normal facial sensation. Speech is clear with no dysarthria noted. There is no hypophonia. There is no lip, neck/head, jaw or voice tremor. Neck is supple with full range of passive and active motion. There are no carotid bruits on auscultation. Oropharynx exam reveals: moderate mouth dryness, adequate dental hygiene and mild airway crowding. Mallampati is class II. Tongue protrudes centrally and palate elevates symmetrically.   Chest: Clear to auscultation without wheezing, rhonchi or crackles noted. His sternotomy scar is unremarkable. He's not particularly  tender around it at this time.   Heart: S1+S2+0, regular and normal without murmurs, rubs or gallops noted.   Abdomen: Soft, non-tender and non-distended with normal bowel sounds appreciated on auscultation.  Extremities: There is no pitting edema in the distal lower extremities bilaterally.   Skin: Warm and dry without trophic changes noted. There are no varicose veins. toes are mildly cold.   Musculoskeletal: exam reveals no obvious joint deformities, tenderness or joint swelling or erythema except fo mild arthritic changes in both hands.    Neurologically:  Mental status: The patient is awake, alert and oriented in all 4 spheres. His immediate and remote memory, attention, language skills and fund of knowledge are appropriate. There is no evidence of aphasia, agnosia, apraxia or anomia. Speech is clear with normal prosody and enunciation. Thought process is linear. Mood is normal and affect is normal.  Cranial nerves II - XII are as described above under HEENT exam. In addition: shoulder shrug is normal with equal shoulder height noted. Motor exam: Normal bulk, strength and tone is noted. There is no drift, tremor or rebound. Romberg is negative. Reflexes are 1+ throughout. Fine motor skills and coordination: intact with normal finger taps, normal hand movements, normal rapid alternating patting, normal foot taps and normal foot agility.  Cerebellar testing: No dysmetria or intention tremor on finger to nose testing. Heel to shin is unremarkable bilaterally. There is no truncal or gait ataxia.  Sensory exam: intact in the upper and lower extremities.  Gait, station and balance: He stands easily. No veering to one side is noted. No leaning to one side is noted. Posture is age-appropriate and stance is narrow based. Gait shows normal stride length and normal pace. No problems turning are noted. Tandem walk is slightly difficult for him.   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 When he increased his Requip to 1.5 mg each night he did not notice any improvement in his symptoms. He then tried gabapentin, up to 400 mg each night which caused no side effects but also no improvement in his symptoms. He decided to stop it about a month ago and has had no flareup in his symptoms. Requip also caused no side effects but no improvement in his symptoms. He is agreeable to trying another dopamine agonist, I suggested a trial of Mirapex  starting with 0.125 mg strength, 1 pill at night for a week with gradual increase in weekly increments to up to 0.375 mg each night. I discussed the central side effects and expectations with patient. I provided a new mail order prescription for him.  In the future, we can consider repeat EMG and nerve conduction testing. His study from February 2016 showed normal findings. Physical exam is stable. Recent blood work showed normal ferritin levels. I suggested a 4 month follow-up, sooner if needed. I did ask him to give [place] a phone update in 3-4 weeks from now. He was in agreement. I answered all his questions today. I spent 25 minutes in total face-to-face time with the patient, more than 50% of which was spent in counseling and coordination of care, reviewing test results, reviewing medication and discussing or reviewing the diagnosis of RLS/PLMD, paresthesias, the prognosis and treatment  options.

## 2016-02-17 DIAGNOSIS — H6521 Chronic serous otitis media, right ear: Secondary | ICD-10-CM | POA: Diagnosis not present

## 2016-02-17 DIAGNOSIS — H900 Conductive hearing loss, bilateral: Secondary | ICD-10-CM | POA: Diagnosis not present

## 2016-03-06 DIAGNOSIS — R7301 Impaired fasting glucose: Secondary | ICD-10-CM | POA: Diagnosis not present

## 2016-03-06 DIAGNOSIS — E782 Mixed hyperlipidemia: Secondary | ICD-10-CM | POA: Diagnosis not present

## 2016-03-06 DIAGNOSIS — E039 Hypothyroidism, unspecified: Secondary | ICD-10-CM | POA: Diagnosis not present

## 2016-03-08 DIAGNOSIS — I10 Essential (primary) hypertension: Secondary | ICD-10-CM | POA: Diagnosis not present

## 2016-03-08 DIAGNOSIS — G2581 Restless legs syndrome: Secondary | ICD-10-CM | POA: Diagnosis not present

## 2016-03-08 DIAGNOSIS — R7301 Impaired fasting glucose: Secondary | ICD-10-CM | POA: Diagnosis not present

## 2016-03-08 DIAGNOSIS — F5101 Primary insomnia: Secondary | ICD-10-CM | POA: Diagnosis not present

## 2016-03-08 DIAGNOSIS — E039 Hypothyroidism, unspecified: Secondary | ICD-10-CM | POA: Diagnosis not present

## 2016-03-08 DIAGNOSIS — E782 Mixed hyperlipidemia: Secondary | ICD-10-CM | POA: Diagnosis not present

## 2016-03-16 DIAGNOSIS — H40013 Open angle with borderline findings, low risk, bilateral: Secondary | ICD-10-CM | POA: Diagnosis not present

## 2016-03-16 DIAGNOSIS — Z961 Presence of intraocular lens: Secondary | ICD-10-CM | POA: Diagnosis not present

## 2016-03-16 DIAGNOSIS — H33101 Unspecified retinoschisis, right eye: Secondary | ICD-10-CM | POA: Diagnosis not present

## 2016-03-16 DIAGNOSIS — H26491 Other secondary cataract, right eye: Secondary | ICD-10-CM | POA: Diagnosis not present

## 2016-03-17 DIAGNOSIS — H35372 Puckering of macula, left eye: Secondary | ICD-10-CM | POA: Diagnosis not present

## 2016-03-17 DIAGNOSIS — H33101 Unspecified retinoschisis, right eye: Secondary | ICD-10-CM | POA: Diagnosis not present

## 2016-03-17 DIAGNOSIS — H35042 Retinal micro-aneurysms, unspecified, left eye: Secondary | ICD-10-CM | POA: Diagnosis not present

## 2016-04-05 DIAGNOSIS — L570 Actinic keratosis: Secondary | ICD-10-CM | POA: Diagnosis not present

## 2016-04-05 DIAGNOSIS — B079 Viral wart, unspecified: Secondary | ICD-10-CM | POA: Diagnosis not present

## 2016-04-20 ENCOUNTER — Encounter: Payer: Self-pay | Admitting: Family

## 2016-04-26 ENCOUNTER — Ambulatory Visit (HOSPITAL_COMMUNITY)
Admission: RE | Admit: 2016-04-26 | Discharge: 2016-04-26 | Disposition: A | Discharge status: Home / Self Care | Payer: Commercial Managed Care - HMO | Source: Ambulatory Visit | Attending: Family | Admitting: Family

## 2016-04-26 ENCOUNTER — Encounter: Payer: Self-pay | Admitting: Family

## 2016-04-26 ENCOUNTER — Ambulatory Visit (INDEPENDENT_AMBULATORY_CARE_PROVIDER_SITE_OTHER): Payer: Commercial Managed Care - HMO | Admitting: Family

## 2016-04-26 VITALS — BP 157/88 | HR 62 | Temp 97.0°F | Resp 18 | Ht 73.0 in | Wt 213.0 lb

## 2016-04-26 DIAGNOSIS — I6523 Occlusion and stenosis of bilateral carotid arteries: Secondary | ICD-10-CM | POA: Diagnosis not present

## 2016-04-26 DIAGNOSIS — Z48812 Encounter for surgical aftercare following surgery on the circulatory system: Secondary | ICD-10-CM | POA: Diagnosis not present

## 2016-04-26 DIAGNOSIS — Z9889 Other specified postprocedural states: Secondary | ICD-10-CM

## 2016-04-26 DIAGNOSIS — Z87891 Personal history of nicotine dependence: Secondary | ICD-10-CM

## 2016-04-26 LAB — VAS US CAROTID
LCCADDIAS: 34 cm/s
LCCADSYS: 120 cm/s
LCCAPDIAS: 17 cm/s
LCCAPSYS: 63 cm/s
LICAPSYS: -92 cm/s
Left ICA dist dias: -16 cm/s
Left ICA dist sys: -56 cm/s
Left ICA prox dias: -25 cm/s
RCCADSYS: -63 cm/s
RCCAPSYS: 129 cm/s
RIGHT CCA MID DIAS: -18 cm/s
RIGHT ECA DIAS: -17 cm/s
Right CCA prox dias: 23 cm/s

## 2016-04-26 NOTE — Progress Notes (Signed)
Chief Complaint: Follow up Extracranial Carotid Artery Stenosis   History of Present Illness  [name] [name] is a 75 y.o. male patient of Dr. Scot Dock who was found have a greater than 80% right carotid stenosis. Preoperative cardiac evaluation showed evidence of significant coronary disease. He underwent combined right carotid endarterectomy and coronary revascularization on 02/29/2012.  He comes in for a yearly follow up visit.  He complains of tingling, numbness, and burning sensation in his hands and feet that he states started about 6 months after his open heart surgery, worse at night; is being evaluated by [facility].   He denies claudication symptoms with walking.  Patient has no history of TIA or stroke symptoms.Specifically he denies a history of amaurosis fugax or monocular blindness, unilateral facial drooping, hemiplegia, or receptive or expressive aphasia.   Pt denies New Medical or Surgical History.  Pt Diabetic: No Pt smoker: former smoker, quit in the 1980's, his wife smokes outside the house. He is usually physically active.  Pt meds include: Statin : Yes ASA: Yes Other anticoagulants/antiplatelets: no   Past Medical History:  Diagnosis Date  . CAD (coronary artery disease)   . CAD (coronary artery disease) 12/04/2011   Left Main Disease with 3- vessel CAD   . CAD (coronary artery disease)    seeing Dr Harl Bowie  . CHF (congestive heart failure) ([place])   . Deviated septum   . Dyslipidemia   . GERD (gastroesophageal reflux disease)   . Heart attack ([place])   . Hyperlipidemia   . Hypertension   . Hypothyroidism   . MI (myocardial infarction) ([place]) 1997  . PONV (postoperative nausea and vomiting)    has in past, no problems after most recent surgery  . 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 S/P carotid endarterectomy 02/29/2012   Right CEA for asymptomatic severe right ICA stenosis   . Shortness  of breath    with exertion  . Sleep apnea    CPAP, sleep study at [facility]  . 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   80-99% stenosis RICA, asymptomatic  . Thyroid nodule   . Wears glasses    or contacts    Social History Social History  Substance Use Topics  . Smoking status: Former Smoker    Types: Cigarettes    Quit date: 09/04/2001  . Smokeless tobacco: Never Used     Comment: smoked 2yrs, smoked 1/2 pack a day  . Alcohol use 1.8 - 2.4 oz/week    3 - 4 Cans of beer per week     Comment: social- beer , not on regular basis    Family History Family History  Problem Relation Age of Onset  . Hypertension Mother   . Healthy Sister   . Healthy Brother   . Healthy Son   . Colon cancer Neg Hx   . Heart attack Father   . Heart disease Father     Before age 74  . Hyperlipidemia Father   . Hypertension Father   . Prostate cancer Father     Surgical History Past Surgical History:  Procedure Laterality Date  . ADENOIDECTOMY    . CARDIAC CATHETERIZATION     02/22/12  . CARDIOVASCULAR STRESS TEST  7/13  . CAROTID ENDARTERECTOMY    . COLONOSCOPY  15-20years and 12/04/11   Done at [facility]  12/11/2011   Procedure: COLONOSCOPY;  Surgeon: Rogene Houston, MD;  [facility];  Service: Endoscopy;  Laterality: N/A;  830  . CORONARY ARTERY BYPASS GRAFT  02/29/2012   Procedure: CORONARY ARTERY BYPASS GRAFTING (CABG);  Surgeon: Rexene Alberts, MD;  Location: [place];  Service: Open Heart Surgery;  Laterality: N/A;  times three using Left Internal Mammary Artery and Right Greater Saphenous Vein Graft Harvested Endoscopically  . CORONARY STENT PLACEMENT    . ENDARTERECTOMY  02/29/2012   Procedure: ENDARTERECTOMY CAROTID;  Surgeon: Angelia Mould, MD;  Location: [place];  Service: Vascular;  Laterality: Right;  . FOOT SURGERY  06/30/11   artificial joint in big toe right foot   . LEFT HEART CATHETERIZATION WITH CORONARY ANGIOGRAM N/A 02/20/2012    Procedure: LEFT HEART CATHETERIZATION WITH CORONARY ANGIOGRAM;  Surgeon: Troy Sine, MD;  Location: [facility] CATH LAB;  Service: Cardiovascular;  Laterality: N/A;  . NASAL SEPTUM SURGERY    . Totol thyroidectomy  02/06/2011  . TRANSESOPHAGEAL ECHOCARDIOGRAM    . UPPER GASTROINTESTINAL ENDOSCOPY  35-40 years ago   [facility]    No Known Allergies  Current Outpatient Prescriptions  Medication Sig Dispense Refill  . aspirin EC 81 MG tablet Take 81 mg by mouth daily.    Marland Kitchen atorvastatin (LIPITOR) 20 MG tablet     . Esomeprazole Magnesium (NEXIUM PO) OTC    . folic acid (FOLVITE) A999333 MCG tablet Take 400 mcg by mouth daily.    . metoprolol (LOPRESSOR) 50 MG tablet Take 1 tablet (50 mg total) by mouth 2 (two) times daily. 180 tablet 1  . Misc Natural Products (OSTEO BI-FLEX ADV DOUBLE ST) TABS Take 1 tablet by mouth 2 (two) times daily.     . Multiple Vitamins-Minerals (PRESERVISION AREDS 2 PO) Take by mouth.    . Omega-3 Fatty Acids (FISH OIL) 1000 MG CPDR Take 1,000 mg by mouth 2 (two) times daily.     Marland Kitchen omeprazole (PRILOSEC) 20 MG capsule Take 20 mg by mouth daily.    . pramipexole (MIRAPEX) 0.125 MG tablet 1 pill each night x 1 week, then 2 pills each night x 1 week, then 3 pills each night thereafter. Take 90-120 minutes before bedtime. 270 tablet 3  . Psyllium (METAMUCIL PO) Take 3 capsules by mouth daily.     . ramipril (ALTACE) 10 MG capsule Take 1 capsule by mouth daily.    Marland Kitchen SYNTHROID 100 MCG tablet     . zolpidem (AMBIEN) 10 MG tablet Take 10 mg by mouth at bedtime as needed for sleep.     No current facility-administered medications for this visit.     Review of Systems : See HPI for pertinent positives and negatives.  Physical Examination  Vitals:   04/26/16 1400 04/26/16 1402  BP: (!) 145/84 (!) 146/88  Pulse: 62 62  Resp: 18   Temp: 97 F (36.1 C)   SpO2: 96%   Weight: 213 lb (96.6 kg)   Height: 6\' 1"  (1.854 m)    Body mass index is 28.1 kg/m.  General: WDWN  male in NAD GAIT: normal Eyes: PERRLA Pulmonary:Respirations are non-labored, CTAB, good air movement, no rales, no rhonchi, & no wheezing.  Cardiac: regular rhythm, no detected murmur.  VASCULAR EXAM Carotid Bruits Right Left   Negative Negative   Radial pulses are 2+ palpable and equal.      LE Pulses Right Left   POPLITEAL not palpable  not palpable   POSTERIOR TIBIAL 2+ palpable  2+ palpable  DORSALIS PEDIS  ANTERIOR TIBIAL 2+ palpable  2+ palpable     Gastrointestinal: soft, nontender, BS WNL, no r/g, no palpable masses.  Musculoskeletal: No muscle atrophy/wasting. M/S 5/5 throughout, Extremities without ischemic changes.  Neurologic: A&O X 3; Appropriate Affect, Speech is normal CN 2-12 intact, Pain and light touch intact in extremities, Motor exam as listed above. Loquacious.          Assessment: [name] [name] is a 75 y.o. male who is s/p right carotid endarterectomy and coronary revascularization on 02/29/2012. He has no history of stroke or TIA. Fortunately he quit smoking in the 1980's and does not have DM. He takes a daily ASA and a statin.   DATA Today's carotid Duplex suggests a patent right internal carotid artery with history of carotid endarterectomy, no significant stenosis present, and  <40% left internal carotid artery stenosis. Both vertebral arteries are antegrade (normal) and both subclavian arteries are multiphasic (normal).  No significant change from exam on 04/21/15.   Plan: Follow-up in 1 year with Carotid Duplex scan.   I discussed in depth with the patient the nature of atherosclerosis, and emphasized the importance of maximal medical management including strict control of blood pressure, blood glucose, and lipid levels, obtaining regular exercise, and  contined ucessation of smoking.  The patient is aware that without maximal medical management the underlying atherosclerotic disease process will progress, limiting the benefit of any interventions. The patient was given information about stroke prevention and what symptoms should prompt the patient to seek immediate medical care. Thank you for allowing [place] to participate in this patient's care.  Clemon Chambers, RN, MSN, FNP-C [facility] Office: [phone]  Clinic Physician: Scot Dock  04/26/16 2:05 PM

## 2016-04-26 NOTE — Patient Instructions (Signed)
Stroke Prevention Some medical conditions and behaviors are associated with an increased chance of having a stroke. You may prevent a stroke by making healthy choices and managing medical conditions. HOW CAN I REDUCE MY RISK OF HAVING A STROKE?   Stay physically active. Get at least 30 minutes of activity on most or all days.  Do not smoke. It may also be helpful to avoid exposure to secondhand smoke.  Limit alcohol use. Moderate alcohol use is considered to be:  No more than 2 drinks per day for men.  No more than 1 drink per day for nonpregnant women.  Eat healthy foods. This involves:  Eating 5 or more servings of fruits and vegetables a day.  Making dietary changes that address high blood pressure (hypertension), high cholesterol, diabetes, or obesity.  Manage your cholesterol levels.  Making food choices that are high in fiber and low in saturated fat, trans fat, and cholesterol may control cholesterol levels.  Take any prescribed medicines to control cholesterol as directed by your health care provider.  Manage your diabetes.  Controlling your carbohydrate and sugar intake is recommended to manage diabetes.  Take any prescribed medicines to control diabetes as directed by your health care provider.  Control your hypertension.  Making food choices that are low in salt (sodium), saturated fat, trans fat, and cholesterol is recommended to manage hypertension.  Ask your health care provider if you need treatment to lower your blood pressure. Take any prescribed medicines to control hypertension as directed by your health care provider.  If you are 18-39 years of age, have your blood pressure checked every 3-5 years. If you are 40 years of age or older, have your blood pressure checked every year.  Maintain a healthy weight.  Reducing calorie intake and making food choices that are low in sodium, saturated fat, trans fat, and cholesterol are recommended to manage  weight.  Stop drug abuse.  Avoid taking birth control pills.  Talk to your health care provider about the risks of taking birth control pills if you are over 35 years old, smoke, get migraines, or have ever had a blood clot.  Get evaluated for sleep disorders (sleep apnea).  Talk to your health care provider about getting a sleep evaluation if you snore a lot or have excessive sleepiness.  Take medicines only as directed by your health care provider.  For some people, aspirin or blood thinners (anticoagulants) are helpful in reducing the risk of forming abnormal blood clots that can lead to stroke. If you have the irregular heart rhythm of atrial fibrillation, you should be on a blood thinner unless there is a good reason you cannot take them.  Understand all your medicine instructions.  Make sure that other conditions (such as anemia or atherosclerosis) are addressed. SEEK IMMEDIATE MEDICAL CARE IF:   You have sudden weakness or numbness of the face, arm, or leg, especially on one side of the body.  Your face or eyelid droops to one side.  You have sudden confusion.  You have trouble speaking (aphasia) or understanding.  You have sudden trouble seeing in one or both eyes.  You have sudden trouble walking.  You have dizziness.  You have a loss of balance or coordination.  You have a sudden, severe headache with no known cause.  You have new chest pain or an irregular heartbeat. Any of these symptoms may represent a serious problem that is an emergency. Do not wait to see if the symptoms will   go away. Get medical help at once. Call your local emergency services (911 in [place] Do not drive yourself to the hospital.   This information is not intended to replace advice given to you by your health care provider. Make sure you discuss any questions you have with your health care provider.   Document Released: 09/07/2004 Document Revised: 08/21/2014 Document Reviewed:  01/31/2013 Elsevier Interactive Patient Education 2016 Elsevier Inc.  

## 2016-04-26 NOTE — Progress Notes (Signed)
Vitals:   04/26/16 1400 04/26/16 1402  BP: (!) 145/84 (!) 146/88  Pulse: 62 62  Resp: 18   Temp: 97 F (36.1 C)   SpO2: 96%   Weight: 213 lb (96.6 kg)   Height: 6\' 1"  (1.854 m)

## 2016-05-22 ENCOUNTER — Encounter: Payer: Self-pay | Admitting: Neurology

## 2016-06-20 ENCOUNTER — Ambulatory Visit: Payer: Commercial Managed Care - HMO | Admitting: Neurology

## 2016-06-20 DIAGNOSIS — G4733 Obstructive sleep apnea (adult) (pediatric): Secondary | ICD-10-CM | POA: Diagnosis not present

## 2016-06-22 DIAGNOSIS — H40002 Preglaucoma, unspecified, left eye: Secondary | ICD-10-CM | POA: Diagnosis not present

## 2016-06-22 DIAGNOSIS — H40001 Preglaucoma, unspecified, right eye: Secondary | ICD-10-CM | POA: Diagnosis not present

## 2016-06-22 DIAGNOSIS — H33101 Unspecified retinoschisis, right eye: Secondary | ICD-10-CM | POA: Diagnosis not present

## 2016-06-22 DIAGNOSIS — H40003 Preglaucoma, unspecified, bilateral: Secondary | ICD-10-CM | POA: Diagnosis not present

## 2016-06-22 DIAGNOSIS — H35372 Puckering of macula, left eye: Secondary | ICD-10-CM | POA: Diagnosis not present

## 2016-07-19 NOTE — Addendum Note (Signed)
Addended by: Lianne Cure A on: 07/19/2016 12:25 PM   Modules accepted: Orders

## 2016-09-06 DIAGNOSIS — I10 Essential (primary) hypertension: Secondary | ICD-10-CM | POA: Diagnosis not present

## 2016-09-06 DIAGNOSIS — R7301 Impaired fasting glucose: Secondary | ICD-10-CM | POA: Diagnosis not present

## 2016-09-06 DIAGNOSIS — E039 Hypothyroidism, unspecified: Secondary | ICD-10-CM | POA: Diagnosis not present

## 2016-09-06 DIAGNOSIS — E782 Mixed hyperlipidemia: Secondary | ICD-10-CM | POA: Diagnosis not present

## 2016-09-08 DIAGNOSIS — Z0000 Encounter for general adult medical examination without abnormal findings: Secondary | ICD-10-CM | POA: Diagnosis not present

## 2016-09-08 DIAGNOSIS — I10 Essential (primary) hypertension: Secondary | ICD-10-CM | POA: Diagnosis not present

## 2016-09-08 DIAGNOSIS — E039 Hypothyroidism, unspecified: Secondary | ICD-10-CM | POA: Diagnosis not present

## 2016-09-08 DIAGNOSIS — G2581 Restless legs syndrome: Secondary | ICD-10-CM | POA: Diagnosis not present

## 2016-09-08 DIAGNOSIS — R7301 Impaired fasting glucose: Secondary | ICD-10-CM | POA: Diagnosis not present

## 2016-09-08 DIAGNOSIS — M25561 Pain in right knee: Secondary | ICD-10-CM | POA: Diagnosis not present

## 2016-09-08 DIAGNOSIS — E782 Mixed hyperlipidemia: Secondary | ICD-10-CM | POA: Diagnosis not present

## 2016-09-08 DIAGNOSIS — F5101 Primary insomnia: Secondary | ICD-10-CM | POA: Diagnosis not present

## 2016-10-02 ENCOUNTER — Ambulatory Visit (INDEPENDENT_AMBULATORY_CARE_PROVIDER_SITE_OTHER): Payer: Medicare HMO

## 2016-10-02 ENCOUNTER — Ambulatory Visit (INDEPENDENT_AMBULATORY_CARE_PROVIDER_SITE_OTHER): Payer: Medicare HMO | Admitting: Orthopedic Surgery

## 2016-10-02 ENCOUNTER — Encounter: Payer: Self-pay | Admitting: Orthopedic Surgery

## 2016-10-02 VITALS — BP 149/86 | HR 64 | Ht 73.5 in | Wt 217.0 lb

## 2016-10-02 DIAGNOSIS — M1711 Unilateral primary osteoarthritis, right knee: Secondary | ICD-10-CM

## 2016-10-02 DIAGNOSIS — M25561 Pain in right knee: Secondary | ICD-10-CM

## 2016-10-02 MED ORDER — DICLOFENAC POTASSIUM 50 MG PO TABS: 50.0000 mg | ORAL_TABLET | Freq: Two times a day (BID) | ORAL | 0 refills | Status: DC

## 2016-10-02 NOTE — Progress Notes (Signed)
Patient ID: [name] [name], male   DOB: 1941-03-08, 76 y.o.   MRN: [id number]  Chief Complaint  Patient presents with  . Knee Pain    right knee pain    76 year old male hit his right knee back in October had some swelling with subsequently went down but after that he noticed that the knee felt uncomfortable when climbing the steps as well as increasing pain on the medial joint line with no additional swelling. The pain appears to be activity related minimal pain at rest and no catching locking or giving way    Review of Systems  Constitutional: Negative for chills, fever and weight loss.  Respiratory: Negative for shortness of breath.   Cardiovascular: Negative for chest pain.  Neurological: Negative for tingling.    Past Medical History:  Diagnosis Date  . CAD (coronary artery disease)   . CAD (coronary artery disease) 12/04/2011   Left Main Disease with 3- vessel CAD   . CAD (coronary artery disease)    seeing Dr Harl Bowie  . CHF (congestive heart failure) ([place])   . Deviated septum   . Dyslipidemia   . GERD (gastroesophageal reflux disease)   . Heart attack   . Hyperlipidemia   . Hypertension   . Hypothyroidism   . MI (myocardial infarction) 1997  . PONV (postoperative nausea and vomiting)    has in past, no problems after most recent surgery  . 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 S/P carotid endarterectomy 02/29/2012   Right CEA for asymptomatic severe right ICA stenosis   . Shortness of breath    with exertion  . Sleep apnea    CPAP, sleep study at [facility]  . 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   80-99% stenosis RICA, asymptomatic  . Thyroid nodule   . Wears glasses    or contacts    Past Surgical History:  Procedure Laterality Date  . ADENOIDECTOMY    . CARDIAC CATHETERIZATION     02/22/12  . CARDIOVASCULAR STRESS TEST  7/13  . CAROTID ENDARTERECTOMY    . COLONOSCOPY  15-20years and 12/04/11   Done at  [facility]  12/11/2011   Procedure: COLONOSCOPY;  Surgeon: Rogene Houston, MD;  [facility];  Service: Endoscopy;  Laterality: N/A;  830  . CORONARY ARTERY BYPASS GRAFT  02/29/2012   Procedure: CORONARY ARTERY BYPASS GRAFTING (CABG);  Surgeon: Rexene Alberts, MD;  Location: [facility];  Service: Open Heart Surgery;  Laterality: N/A;  times three using Left Internal Mammary Artery and Right Greater Saphenous Vein Graft Harvested Endoscopically  . CORONARY STENT PLACEMENT    . ENDARTERECTOMY  02/29/2012   Procedure: ENDARTERECTOMY CAROTID;  Surgeon: Angelia Mould, MD;  Location: [place];  Service: Vascular;  Laterality: Right;  . FOOT SURGERY  06/30/11   artificial joint in big toe right foot   . LEFT HEART CATHETERIZATION WITH CORONARY ANGIOGRAM N/A 02/20/2012   Procedure: LEFT HEART CATHETERIZATION WITH CORONARY ANGIOGRAM;  Surgeon: Troy Sine, MD;  Location: [facility] CATH LAB;  Service: Cardiovascular;  Laterality: N/A;  . NASAL SEPTUM SURGERY    . Totol thyroidectomy  02/06/2011  . TRANSESOPHAGEAL ECHOCARDIOGRAM    . UPPER GASTROINTESTINAL ENDOSCOPY  35-40 years ago   [place]  BP (!) 149/86   Pulse 64   Ht 6' 1.5" (1.867 m)  Wt 217 lb (98.4 kg)   BMI 28.24 kg/m  GENERAL appearance reveals no gross abnormalities, normal development grooming and hygiene   MENTAL STATUS we note that the patient is awake alert and oriented to person place and time MOOD/AFFECT ARE NORMAL   GAIT reveals no limp in the effected limb  EXAM OF THE RIGHT  KNEE SKIN no erythema lacerations or ecchymosis  INSPECTION MOD tenderness over the MEDIAL  joint line and NO joint effusion , Mild varus ROM is 5-125 STABILITY tests for the acl (anterior drawer) and pcl posterior drawer are normal. the collateral ligaments are stable with varus valgus stress testing  MOTOR GRADE 5/5 in the quad muscles    examination of the  LEFT knee  Full range of motion 0-1  35 no tenderness or swelling normal alignment no tenderness or joint effusion  VASC 2+ dorsalis pedis pulse normal capillary refill excellent warmth to the extremity  NEURO normal sensation and no pathologic reflexes  LYMPH deferred noncontributory   IMAGING STUDIES  I have independently reviewed the x-rays and I interpreted the x-rays as follows:  Moderate arthritis of the right knee  Dx   primary osteoarthritis of the  right knee  PLAN  start diclofenac 50 mg twice a day come back one month if no improvement recommend injection  9:07 AM Arther Abbott, MD 10/02/2016

## 2016-10-02 NOTE — Progress Notes (Signed)
    Radiology report dictated by Dr. Aline Brochure  3 views right knee  Right knee pain  The overall knee alignment is 1-2 of physiologic valgus. There is narrowing of the medial joint space, sclerosis of the tibial plateau medially  There may be some chondrocalcinosis laterally.  Calcification and surgical clips in the popliteal fossa and popliteal vessels. Superior osteophyte and inferior osteophyte on the patella with patellofemoral cyst formation suggestive of degenerative changes. On the lateral x-ray with the knee in flexion the joint space narrowing is more pronounced  On the axial view we see there is neutral patellar alignment osteophytes medially dominant lateral facet femoral osteophytes medially and laterally cyst formation in the trochlea and patella  Impression medial and patellofemoral arthritis moderate.

## 2016-10-09 DIAGNOSIS — E039 Hypothyroidism, unspecified: Secondary | ICD-10-CM | POA: Diagnosis not present

## 2016-10-13 DIAGNOSIS — H40013 Open angle with borderline findings, low risk, bilateral: Secondary | ICD-10-CM | POA: Diagnosis not present

## 2016-10-13 DIAGNOSIS — H35371 Puckering of macula, right eye: Secondary | ICD-10-CM | POA: Diagnosis not present

## 2016-10-13 DIAGNOSIS — H33101 Unspecified retinoschisis, right eye: Secondary | ICD-10-CM | POA: Diagnosis not present

## 2016-10-13 DIAGNOSIS — H04123 Dry eye syndrome of bilateral lacrimal glands: Secondary | ICD-10-CM | POA: Diagnosis not present

## 2016-10-30 ENCOUNTER — Ambulatory Visit (INDEPENDENT_AMBULATORY_CARE_PROVIDER_SITE_OTHER): Payer: Medicare HMO | Admitting: Orthopedic Surgery

## 2016-10-30 ENCOUNTER — Encounter: Payer: Self-pay | Admitting: Orthopedic Surgery

## 2016-10-30 DIAGNOSIS — M1711 Unilateral primary osteoarthritis, right knee: Secondary | ICD-10-CM | POA: Diagnosis not present

## 2016-10-30 NOTE — Progress Notes (Signed)
Progress Note   Patient ID: [name] [name], male   DOB: [birth year], 76 y.o.   MRN: [id number]  Chief Complaint  Patient presents with  . Follow-up    RIGHT KNEE PAIN    HPI Arn D Chagoya is a 76 y.o. male.   HPI 76 year old male started on diclofenac for arthritis of the knee he says he is 80% better  Review of Systems Review of Systems Complain of atraumatic left shoulder pain   Examination There were no vitals taken for this visit.  Gen. appearance the patient's appearance is normal with normal grooming and  hygiene The patient is oriented to person place and time Mood and affect are normal   Ortho Exam He is walking without a limp He has adequate amount of flexion and his knee Is not using any assistive devices Knee remained stable  Medical decision-making Diagnosis, Data, Plan (risk)  Encounter Diagnosis  Name Primary?  . Primary osteoarthritis of right knee Yes    Finish the diclofenac. His wife takes the other version of diclofenac and if he has to stay on the medicine any longer he would like to switch and go through [facility] because it's cheaper  Otherwise follow-up as needed  Arther Abbott, MD 10/30/2016 9:55 AM

## 2016-10-31 DIAGNOSIS — H6123 Impacted cerumen, bilateral: Secondary | ICD-10-CM | POA: Diagnosis not present

## 2016-11-07 DIAGNOSIS — H43813 Vitreous degeneration, bilateral: Secondary | ICD-10-CM | POA: Diagnosis not present

## 2016-11-07 DIAGNOSIS — H35373 Puckering of macula, bilateral: Secondary | ICD-10-CM | POA: Diagnosis not present

## 2016-11-07 DIAGNOSIS — H353112 Nonexudative age-related macular degeneration, right eye, intermediate dry stage: Secondary | ICD-10-CM | POA: Diagnosis not present

## 2016-11-07 DIAGNOSIS — H353121 Nonexudative age-related macular degeneration, left eye, early dry stage: Secondary | ICD-10-CM | POA: Diagnosis not present

## 2016-12-20 DIAGNOSIS — G4733 Obstructive sleep apnea (adult) (pediatric): Secondary | ICD-10-CM | POA: Diagnosis not present

## 2016-12-26 DIAGNOSIS — H66001 Acute suppurative otitis media without spontaneous rupture of ear drum, right ear: Secondary | ICD-10-CM | POA: Diagnosis not present

## 2017-03-05 DIAGNOSIS — H66001 Acute suppurative otitis media without spontaneous rupture of ear drum, right ear: Secondary | ICD-10-CM | POA: Diagnosis not present

## 2017-03-05 DIAGNOSIS — J310 Chronic rhinitis: Secondary | ICD-10-CM | POA: Diagnosis not present

## 2017-03-07 DIAGNOSIS — I10 Essential (primary) hypertension: Secondary | ICD-10-CM | POA: Diagnosis not present

## 2017-03-07 DIAGNOSIS — E039 Hypothyroidism, unspecified: Secondary | ICD-10-CM | POA: Diagnosis not present

## 2017-04-12 ENCOUNTER — Encounter: Payer: Self-pay | Admitting: Cardiology

## 2017-04-12 ENCOUNTER — Ambulatory Visit (INDEPENDENT_AMBULATORY_CARE_PROVIDER_SITE_OTHER): Payer: Medicare HMO | Admitting: Cardiology

## 2017-04-12 VITALS — BP 138/80 | HR 58 | Ht 73.0 in | Wt 213.0 lb

## 2017-04-12 DIAGNOSIS — E782 Mixed hyperlipidemia: Secondary | ICD-10-CM | POA: Diagnosis not present

## 2017-04-12 DIAGNOSIS — I10 Essential (primary) hypertension: Secondary | ICD-10-CM

## 2017-04-12 DIAGNOSIS — I6523 Occlusion and stenosis of bilateral carotid arteries: Secondary | ICD-10-CM

## 2017-04-12 DIAGNOSIS — I2510 Atherosclerotic heart disease of native coronary artery without angina pectoris: Secondary | ICD-10-CM

## 2017-04-12 NOTE — Patient Instructions (Signed)

## 2017-04-12 NOTE — Progress Notes (Signed)
Clinical Summary [name] [name] is a 76 y.o.male seen today for follow up of the following medical problems.   1. CAD  - prior 3 vessel CABG July 2013, LVEF around that time by LV gram was 55%  - has had chronic MSK chest pain since his CABG that is unchanged    - no recent chest pain. No SOB or DOE - compliant with meds  2. Carotid stenosis  - prior right CEA July 2013  - followed by vascular surgery   - has upcoming appt w vascular  3. HTN  - compliant with meds   4. Hyperlipidemia  - compliant with statin 02/2017 TC 133 TG 105 HDL 32 LDL 80   5. OSA  - compliant CPAP   Past Medical History:  Diagnosis Date  . CAD (coronary artery disease)   . CAD (coronary artery disease) 12/04/2011   Left Main Disease with 3- vessel CAD   . CAD (coronary artery disease)    seeing Dr Harl Bowie  . CHF (congestive heart failure) ([place])   . Deviated septum   . Dyslipidemia   . GERD (gastroesophageal reflux disease)   . Heart attack   . Hyperlipidemia   . Hypertension   . Hypothyroidism   . MI (myocardial infarction) 1997  . PONV (postoperative nausea and vomiting)    has in past, no problems after most recent surgery  . 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 S/P carotid endarterectomy 02/29/2012   Right CEA for asymptomatic severe right ICA stenosis   . Shortness of breath    with exertion  . Sleep apnea    CPAP, sleep study at [facility]  . 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   80-99% stenosis RICA, asymptomatic  . Thyroid nodule   . Wears glasses    or contacts     No Known Allergies   Current Outpatient Prescriptions  Medication Sig Dispense Refill  . aspirin EC 81 MG tablet Take 81 mg by mouth daily.    Marland Kitchen atorvastatin (LIPITOR) 20 MG tablet     . diclofenac (CATAFLAM) 50 MG tablet Take 1 tablet (50 mg total) by mouth 2 (two) times daily. 60 tablet 0  . Esomeprazole Magnesium (NEXIUM PO) OTC    .  folic acid (FOLVITE) 725 MCG tablet Take 400 mcg by mouth daily.    . Gabapentin Enacarbil (HORIZANT) 600 MG TBCR Take by mouth.    . metoprolol (LOPRESSOR) 50 MG tablet Take 1 tablet (50 mg total) by mouth 2 (two) times daily. 180 tablet 1  . Misc Natural Products (OSTEO BI-FLEX ADV DOUBLE ST) TABS Take 1 tablet by mouth 2 (two) times daily.     . Multiple Vitamins-Minerals (PRESERVISION AREDS 2 PO) Take by mouth.    . Omega-3 Fatty Acids (FISH OIL) 1000 MG CPDR Take 1,000 mg by mouth 2 (two) times daily.     Marland Kitchen omeprazole (PRILOSEC) 20 MG capsule Take 20 mg by mouth daily.    . Psyllium (METAMUCIL PO) Take 3 capsules by mouth daily.     . ramipril (ALTACE) 10 MG capsule Take 1 capsule by mouth daily.    Marland Kitchen SYNTHROID 100 MCG tablet 112 mcg.     . zolpidem (AMBIEN) 10 MG tablet Take 10 mg by mouth at bedtime as needed for sleep.     No current facility-administered medications for this visit.  Past Surgical History:  Procedure Laterality Date  . ADENOIDECTOMY    . CARDIAC CATHETERIZATION     02/22/12  . CARDIOVASCULAR STRESS TEST  7/13  . CAROTID ENDARTERECTOMY    . COLONOSCOPY  15-20years and 12/04/11   Done at [facility]  12/11/2011   Procedure: COLONOSCOPY;  Surgeon: Rogene Houston, MD;  Location: [facility];  Service: Endoscopy;  Laterality: N/A;  830  . CORONARY ARTERY BYPASS GRAFT  02/29/2012   Procedure: CORONARY ARTERY BYPASS GRAFTING (CABG);  Surgeon: Rexene Alberts, MD;  Location: [place];  Service: Open Heart Surgery;  Laterality: N/A;  times three using Left Internal Mammary Artery and Right Greater Saphenous Vein Graft Harvested Endoscopically  . CORONARY STENT PLACEMENT    . ENDARTERECTOMY  02/29/2012   Procedure: ENDARTERECTOMY CAROTID;  Surgeon: Angelia Mould, MD;  Location: [place];  Service: Vascular;  Laterality: Right;  . FOOT SURGERY  06/30/11   artificial joint in big toe right foot   . LEFT HEART CATHETERIZATION WITH CORONARY  ANGIOGRAM N/A 02/20/2012   Procedure: LEFT HEART CATHETERIZATION WITH CORONARY ANGIOGRAM;  Surgeon: Troy Sine, MD;  Location: [facility] CATH LAB;  Service: Cardiovascular;  Laterality: N/A;  . NASAL SEPTUM SURGERY    . Totol thyroidectomy  02/06/2011  . TRANSESOPHAGEAL ECHOCARDIOGRAM    . UPPER GASTROINTESTINAL ENDOSCOPY  35-40 years ago   [facility]     No Known Allergies    Family History  Problem Relation Age of Onset  . Hypertension Mother   . Healthy Sister   . Healthy Brother   . Healthy Son   . Colon cancer Neg Hx   . Heart attack Father   . Heart disease Father        Before age 6  . Hyperlipidemia Father   . Hypertension Father   . Prostate cancer Father      Social History [name] [name] reports that he quit smoking about 15 years ago. His smoking use included Cigarettes. He has never used smokeless tobacco. [name] [name] reports that he drinks about 1.8 - 2.4 oz of alcohol per week .   Review of Systems CONSTITUTIONAL: No weight loss, fever, chills, weakness or fatigue.  HEENT: Eyes: No visual loss, blurred vision, double vision or yellow sclerae.No hearing loss, sneezing, congestion, runny nose or sore throat.  SKIN: No rash or itching.  CARDIOVASCULAR: per hpi RESPIRATORY: No shortness of breath, cough or sputum.  GASTROINTESTINAL: No anorexia, nausea, vomiting or diarrhea. No abdominal pain or blood.  GENITOURINARY: No burning on urination, no polyuria NEUROLOGICAL: No headache, dizziness, syncope, paralysis, ataxia, numbness or tingling in the extremities. No change in bowel or bladder control.  MUSCULOSKELETAL: No muscle, back pain, joint pain or stiffness.  LYMPHATICS: No enlarged nodes. No history of splenectomy.  PSYCHIATRIC: No history of depression or anxiety.  ENDOCRINOLOGIC: No reports of sweating, cold or heat intolerance. No polyuria or polydipsia.  Marland Kitchen   Physical Examination Vitals:   04/12/17 1310  BP: 138/80  Pulse: (!) 58  SpO2: 97%    Vitals:   04/12/17 1310  Weight: 213 lb (96.6 kg)  Height: 6\' 1"  (1.854 m)    Gen: resting comfortably, no acute distress HEENT: no scleral icterus, pupils equal round and reactive, no palptable cervical adenopathy,  CV:RRR, no mrg, no jvd Resp: Clear to auscultation bilaterally GI: abdomen is soft, non-tender, non-distended, normal bowel sounds, no hepatosplenomegaly MSK: extremities are warm, no edema.  Skin: warm, no  rash Neuro:  no focal deficits Psych: appropriate affect   Diagnostic Studies 02/2012 Cath  1. Normal left ventricular function with an ejection fraction of at  least 55% with suggestion of minimal posterobasal inferior  hypocontractility.  2. Extensive coronary calcification involving the left main, left  anterior descending artery, left circumflex, and right coronary  arteries.  3. Probable ostial tapering of the left main to 60-70% with evidence  for AO pressure dampening by 10-15 mm concordant with ostial  stenosis.  4. 40% proximal circumflex with 70% circumflex marginal 1 stenosis.  5. Total occlusion of the right coronary artery at the ostium with  extensive collateralization to the posterior descending artery  vessel via the left coronary circulation.   03/2013 Echo  LVEF 55-60%, no WMAs, grade I diastolic dysfunction, mild MR   02/2013 Carotid US  Patent right CEA, <40% stenosis on left.   04/2015 Carotid US Patent RICA/CEA, LICA 11-21%     Assessment and Plan   1. CAD  - no current symptoms  - continue current meds .  2. Carotid stenosis  - follow up with vascular   3. HTN  - bp is at goal, continue current meds   4. Hyperlipidemia  Continue statin, not on list today but he reports he is taking, he will call us w drug info   F/u 1 year      Arnoldo Lenis, M.D.

## 2017-04-20 DIAGNOSIS — H04123 Dry eye syndrome of bilateral lacrimal glands: Secondary | ICD-10-CM | POA: Diagnosis not present

## 2017-04-20 DIAGNOSIS — H33101 Unspecified retinoschisis, right eye: Secondary | ICD-10-CM | POA: Diagnosis not present

## 2017-04-20 DIAGNOSIS — H353131 Nonexudative age-related macular degeneration, bilateral, early dry stage: Secondary | ICD-10-CM | POA: Diagnosis not present

## 2017-04-20 DIAGNOSIS — H40013 Open angle with borderline findings, low risk, bilateral: Secondary | ICD-10-CM | POA: Diagnosis not present

## 2017-04-25 ENCOUNTER — Telehealth: Payer: Self-pay | Admitting: Cardiology

## 2017-04-25 NOTE — Telephone Encounter (Signed)
Will make note and add at pt's next visit.

## 2017-04-25 NOTE — Telephone Encounter (Signed)
Pt brought in bottle of  Atorvastation 20 mg tab Take 1 tab every day from Pablo Lawrence NP w/ Dr. Wende Neighbors he'd like it to be added to his med list

## 2017-04-27 ENCOUNTER — Ambulatory Visit (HOSPITAL_COMMUNITY)
Admission: RE | Admit: 2017-04-27 | Discharge: 2017-04-27 | Disposition: A | Discharge status: Home / Self Care | Payer: Medicare HMO | Source: Ambulatory Visit | Attending: Vascular Surgery | Admitting: Vascular Surgery

## 2017-04-27 ENCOUNTER — Ambulatory Visit (INDEPENDENT_AMBULATORY_CARE_PROVIDER_SITE_OTHER): Payer: Medicare HMO | Admitting: Family

## 2017-04-27 ENCOUNTER — Encounter: Payer: Self-pay | Admitting: Family

## 2017-04-27 VITALS — BP 159/94 | HR 63 | Temp 97.4°F | Resp 18 | Ht 73.0 in | Wt 214.0 lb

## 2017-04-27 DIAGNOSIS — I6523 Occlusion and stenosis of bilateral carotid arteries: Secondary | ICD-10-CM | POA: Diagnosis not present

## 2017-04-27 DIAGNOSIS — Z87891 Personal history of nicotine dependence: Secondary | ICD-10-CM

## 2017-04-27 DIAGNOSIS — Z9889 Other specified postprocedural states: Secondary | ICD-10-CM

## 2017-04-27 LAB — VAS US CAROTID
LCCADSYS: -66 cm/s
LCCAPDIAS: 20 cm/s
LCCAPSYS: 79 cm/s
Left CCA dist dias: -17 cm/s
Left ICA dist dias: 23 cm/s
Left ICA dist sys: 97 cm/s
Left ICA prox dias: 26 cm/s
Left ICA prox sys: 119 cm/s
RCCAPDIAS: 17 cm/s
RIGHT CCA MID DIAS: 19 cm/s
RIGHT ECA DIAS: -12 cm/s
RIGHT VERTEBRAL DIAS: -17 cm/s
Right CCA prox sys: 159 cm/s
Right cca dist sys: 83 cm/s

## 2017-04-27 NOTE — Patient Instructions (Signed)
Stroke Prevention Some medical conditions and behaviors are associated with an increased chance of having a stroke. You may prevent a stroke by making healthy choices and managing medical conditions. How can I reduce my risk of having a stroke?  Stay physically active. Get at least 30 minutes of activity on most or all days.  Do not smoke. It may also be helpful to avoid exposure to secondhand smoke.  Limit alcohol use. Moderate alcohol use is considered to be: ? No more than 2 drinks per day for men. ? No more than 1 drink per day for nonpregnant women.  Eat healthy foods. This involves: ? Eating 5 or more servings of fruits and vegetables a day. ? Making dietary changes that address high blood pressure (hypertension), high cholesterol, diabetes, or obesity.  Manage your cholesterol levels. ? Making food choices that are high in fiber and low in saturated fat, trans fat, and cholesterol may control cholesterol levels. ? Take any prescribed medicines to control cholesterol as directed by your health care provider.  Manage your diabetes. ? Controlling your carbohydrate and sugar intake is recommended to manage diabetes. ? Take any prescribed medicines to control diabetes as directed by your health care provider.  Control your hypertension. ? Making food choices that are low in salt (sodium), saturated fat, trans fat, and cholesterol is recommended to manage hypertension. ? Ask your health care provider if you need treatment to lower your blood pressure. Take any prescribed medicines to control hypertension as directed by your health care provider. ? If you are 18-39 years of age, have your blood pressure checked every 3-5 years. If you are 40 years of age or older, have your blood pressure checked every year.  Maintain a healthy weight. ? Reducing calorie intake and making food choices that are low in sodium, saturated fat, trans fat, and cholesterol are recommended to manage  weight.  Stop drug abuse.  Avoid taking birth control pills. ? Talk to your health care provider about the risks of taking birth control pills if you are over 35 years old, smoke, get migraines, or have ever had a blood clot.  Get evaluated for sleep disorders (sleep apnea). ? Talk to your health care provider about getting a sleep evaluation if you snore a lot or have excessive sleepiness.  Take medicines only as directed by your health care provider. ? For some people, aspirin or blood thinners (anticoagulants) are helpful in reducing the risk of forming abnormal blood clots that can lead to stroke. If you have the irregular heart rhythm of atrial fibrillation, you should be on a blood thinner unless there is a good reason you cannot take them. ? Understand all your medicine instructions.  Make sure that other conditions (such as anemia or atherosclerosis) are addressed. Get help right away if:  You have sudden weakness or numbness of the face, arm, or leg, especially on one side of the body.  Your face or eyelid droops to one side.  You have sudden confusion.  You have trouble speaking (aphasia) or understanding.  You have sudden trouble seeing in one or both eyes.  You have sudden trouble walking.  You have dizziness.  You have a loss of balance or coordination.  You have a sudden, severe headache with no known cause.  You have new chest pain or an irregular heartbeat. Any of these symptoms may represent a serious problem that is an emergency. Do not wait to see if the symptoms will go away.   Get medical help at once. Call your local emergency services (911 in [place] Do not drive yourself to the hospital. This information is not intended to replace advice given to you by your health care provider. Make sure you discuss any questions you have with your health care provider. Document Released: 09/07/2004 Document Revised: 01/06/2016 Document Reviewed: 01/31/2013 Elsevier  Interactive Patient Education  2017 [employer].     Preventing Cerebrovascular Disease Arteries are blood vessels that carry blood that contains oxygen from the heart to all parts of the body. Cerebrovascular disease affects arteries that supply the brain. Any condition that blocks or disrupts blood flow to the brain can cause cerebrovascular disease. Brain cells that lose blood supply start to die within minutes (stroke). Stroke is the main danger of cerebrovascular disease. Atherosclerosis and high blood pressure are common causes of cerebrovascular disease. Atherosclerosis is narrowing and hardening of an artery that results when fat, cholesterol, calcium, or other substances (plaque) build up inside an artery. Plaque reduces blood flow through the artery. High blood pressure increases the risk of bleeding inside the brain. Making diet and lifestyle changes to prevent atherosclerosis and high blood pressure lowers your risk of cerebrovascular disease. What nutrition changes can be made?  Eat more fruits, vegetables, and whole grains.  Reduce how much saturated fat you eat. To do this, eat less red meat and fewer full-fat dairy products.  Eat healthy proteins instead of red meat. Healthy proteins include: ? Fish. Eat fish that contains heart-healthy omega-3 fatty acids, twice a week. Examples include salmon, albacore tuna, mackerel, and herring. ? Chicken. ? Nuts. ? Low-fat or nonfat yogurt.  Avoid processed meats, like bacon and lunchmeat.  Avoid foods that contain: ? A lot of sugar, such as sweets and drinks with added sugar. ? A lot of salt (sodium). Avoid adding extra salt to your food, as told by your health care provider. ? Trans fats, such as margarine and baked goods. Trans fats may be listed as "partially hydrogenated oils" on food labels.  Check food labels to see how much sodium, sugar, and trans fats are in foods.  Use vegetable oils that contain low amounts of  saturated fat, such as olive oil or canola oil. What lifestyle changes can be made?  Drink alcohol in moderation. This means no more than 1 drink a day for nonpregnant women and 2 drinks a day for men. One drink equals 12 oz of beer, 5 oz of wine, or 1 oz of hard liquor.  If you are overweight, ask your health care provider to recommend a weight-loss plan for you. Losing 5-10 lb (2.2-4.5 kg) can reduce your risk of diabetes, atherosclerosis, and high blood pressure.  Exercise for 30?60 minutes on most days, or as much as told by your health care provider. ? Do moderate-intensity exercise, such as brisk walking, bicycling, and water aerobics. Ask your health care provider which activities are safe for you.  Do not use any products that contain nicotine or tobacco, such as cigarettes and e-cigarettes. If you need help quitting, ask your health care provider. Why are these changes important? Making these changes lowers your risk of many diseases that can cause cerebrovascular disease and stroke. Stroke is a leading cause of death and disability. Making these changes also improves your overall health and quality of life. What can I do to lower my risk? The following factors make you more likely to develop cerebrovascular disease:  Being overweight.  Smoking.  Being physically inactive.    Eating a high-fat diet.  Having certain health conditions, such as: ? Diabetes. ? High blood pressure. ? Heart disease. ? Atherosclerosis. ? High cholesterol. ? Sickle cell disease.  Talk with your health care provider about your risk for cerebrovascular disease. Work with your health care provider to control diseases that you have that may contribute to cerebrovascular disease. Your health care provider may prescribe medicines to help prevent major causes of cerebrovascular disease. Where to find more information: Learn more about preventing cerebrovascular disease from:  National Heart, Lung, and  Blood Institute: www.nhlbi.nih.gov/health/health-topics/topics/stroke  Centers for Disease Control and Prevention: cdc.gov/stroke/about.htm  Summary  Cerebrovascular disease can lead to a stroke.  Atherosclerosis and high blood pressure are major causes of cerebrovascular disease.  Making diet and lifestyle changes can reduce your risk of cerebrovascular disease.  Work with your health care provider to get your risk factors under control to reduce your risk of cerebrovascular disease. This information is not intended to replace advice given to you by your health care provider. Make sure you discuss any questions you have with your health care provider. Document Released: 08/15/2015 Document Revised: 02/18/2016 Document Reviewed: 08/15/2015 Elsevier Interactive Patient Education  2018 [employer] Inc.  

## 2017-04-27 NOTE — Progress Notes (Signed)
Chief Complaint: Follow up Extracranial Carotid Artery Stenosis   History of Present Illness  [name] [name] is a 76 y.o. male patient of Dr. Scot Dock who was found have a greater than 80% right carotid stenosis. Preoperative cardiac evaluation showed evidence of significant coronary disease. He underwent combined right carotid endarterectomy and coronary revascularization on 02/29/2012.  He comes in for a yearly follow up visit.  He complains of tingling, numbness, and burning sensation in his hands and feet that he states started about 6 months after his open heart surgery, worse at night; is being evaluated by [facility]. He states his balance is worsening due to this.   He denies claudication symptoms with walking.  Patient has no history of TIA or stroke symptoms.Specifically he denies a history of amaurosis fugax or monocular blindness, unilateral facial drooping, hemiplegia, or receptive or expressive aphasia.   Pt denies New Medical or Surgical History.  Pt Diabetic: No Pt smoker: former smoker, quit in the 1980's, his wife smokes outside the house. He is usually physically active.  Pt meds include: Statin : Yes ASA: Yes Other anticoagulants/antiplatelets: no   Past Medical History:  Diagnosis Date  . CAD (coronary artery disease)   . CAD (coronary artery disease) 12/04/2011   Left Main Disease with 3- vessel CAD   . CAD (coronary artery disease)    seeing Dr Harl Bowie  . CHF (congestive heart failure) ([place])   . Deviated septum   . Dyslipidemia   . GERD (gastroesophageal reflux disease)   . Heart attack ([place])   . Hyperlipidemia   . Hypertension   . Hypothyroidism   . MI (myocardial infarction) ([place]) 1997  . PONV (postoperative nausea and vomiting)    has in past, no problems after most recent surgery  . 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 S/P carotid endarterectomy 02/29/2012   Right CEA for  asymptomatic severe right ICA stenosis   . Shortness of breath    with exertion  . Sleep apnea    CPAP, [facility] at [facility]  . 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   80-99% stenosis RICA, asymptomatic  . Thyroid nodule   . Wears glasses    or contacts    Social History Social History  Substance Use Topics  . Smoking status: Former Smoker    Types: Cigarettes    Quit date: 09/04/2001  . Smokeless tobacco: Never Used     Comment: smoked 55yrs, smoked 1/2 pack a day  . Alcohol use 1.8 - 2.4 oz/week    3 - 4 Cans of beer per week     Comment: social- beer , not on regular basis    Family History Family History  Problem Relation Age of Onset  . Hypertension Mother   . Healthy Sister   . Healthy Brother   . Healthy Son   . Heart attack Father   . Heart disease Father        Before age 38  . Hyperlipidemia Father   . Hypertension Father   . Prostate cancer Father   . Colon cancer Neg Hx     Surgical History Past Surgical History:  Procedure Laterality Date  . ADENOIDECTOMY    . CARDIAC CATHETERIZATION     02/22/12  . CARDIOVASCULAR STRESS TEST  7/13  . CAROTID ENDARTERECTOMY    . COLONOSCOPY  15-20years and 12/04/11   Done at  [place]  12/11/2011   Procedure: COLONOSCOPY;  Surgeon: Rogene Houston, MD;  [facility];  Service: Endoscopy;  Laterality: N/A;  830  . CORONARY ARTERY BYPASS GRAFT  02/29/2012   Procedure: CORONARY ARTERY BYPASS GRAFTING (CABG);  Surgeon: Rexene Alberts, MD;  Location: [place];  Service: Open Heart Surgery;  Laterality: N/A;  times three using Left Internal Mammary Artery and Right Greater Saphenous Vein Graft Harvested Endoscopically  . CORONARY STENT PLACEMENT    . ENDARTERECTOMY  02/29/2012   Procedure: ENDARTERECTOMY CAROTID;  Surgeon: Angelia Mould, MD;  Location: [place];  Service: Vascular;  Laterality: Right;  . FOOT SURGERY  06/30/11   artificial joint in big toe right foot   . LEFT  HEART CATHETERIZATION WITH CORONARY ANGIOGRAM N/A 02/20/2012   Procedure: LEFT HEART CATHETERIZATION WITH CORONARY ANGIOGRAM;  Surgeon: Troy Sine, MD;  Location: [facility] CATH LAB;  Service: Cardiovascular;  Laterality: N/A;  . NASAL SEPTUM SURGERY    . Totol thyroidectomy  02/06/2011  . TRANSESOPHAGEAL ECHOCARDIOGRAM    . UPPER GASTROINTESTINAL ENDOSCOPY  35-40 years ago   [facility]    No Known Allergies  Current Outpatient Prescriptions  Medication Sig Dispense Refill  . aspirin EC 81 MG tablet Take 81 mg by mouth daily.    . diclofenac (CATAFLAM) 50 MG tablet Take 1 tablet (50 mg total) by mouth 2 (two) times daily. 60 tablet 0  . Esomeprazole Magnesium (NEXIUM PO) OTC    . metoprolol (LOPRESSOR) 50 MG tablet Take 1 tablet (50 mg total) by mouth 2 (two) times daily. 180 tablet 1  . Multiple Vitamins-Minerals (PRESERVISION AREDS 2 PO) Take by mouth.    . Omega-3 Fatty Acids (FISH OIL) 1000 MG CPDR Take 1,000 mg by mouth 2 (two) times daily.     Marland Kitchen omeprazole (PRILOSEC) 20 MG capsule Take 20 mg by mouth daily.    . Psyllium (METAMUCIL PO) Take 3 capsules by mouth daily.     . ramipril (ALTACE) 10 MG capsule Take 1 capsule by mouth daily.    . Red Yeast Rice 600 MG CAPS Take 600 mg by mouth daily.    Marland Kitchen SYNTHROID 125 MCG tablet Take 125 mcg by mouth daily before breakfast.     . zolpidem (AMBIEN) 10 MG tablet Take 10 mg by mouth at bedtime as needed for sleep.     No current facility-administered medications for this visit.     Review of Systems : See HPI for pertinent positives and negatives.  Physical Examination  Vitals:   04/27/17 1533 04/27/17 1535  BP: (!) 159/83 (!) 159/94  Pulse: 63   Resp: 18   Temp: (!) 97.4 F (36.3 C)   TempSrc: Oral   SpO2: 96%   Weight: 214 lb (97.1 kg)   Height: 6\' 1"  (1.854 m)    Body mass index is 28.23 kg/m.  General: WDWN male in NAD GAIT:normal Eyes: PERRLA Pulmonary:Respirations are non-labored, CTAB, good air movement, no  rales, no rhonchi, &no wheezing.  Cardiac: regular rhythm, no detected murmur.  VASCULAR EXAM Carotid Bruits Right Left   Negative Negative   Radial pulses are 2+ palpable and equal.      LE Pulses Right Left   POPLITEAL not palpable  not palpable   POSTERIOR TIBIAL 2+ palpable  2+ palpable    DORSALIS PEDIS  ANTERIOR TIBIAL 2+ palpable  2+ palpable     Gastrointestinal: soft, nontender, BS WNL, no r/g,  no palpable masses.  Musculoskeletal: No muscle atrophy/wasting. M/S 5/5 throughout, Extremities without ischemic changes.  Neurologic: A&O X 3; appropriate affect, speech is normal, CN 2-12 intact, Pain and light touch intact in extremities, Motor exam as listed above. Loquacious.      Assessment: ABDIRIZAK RICHISON is a 76 y.o. male who is s/pright carotid endarterectomy and coronary revascularization on 02/29/2012. He has no history of stroke or TIA. Fortunately he quit smoking in the 1980's and does not have DM. He takes a daily ASA and a statin.   DATA Carotid Duplex (04/27/17): Patent right internal carotid artery with history of carotid endarterectomy, no significant stenosis present,and <40%left internal carotid artery stenosis. No color flow visualized in the left ECA suggestive of possible occlusion.  Both vertebral arteries are antegrade (normal) and both subclavian arteries are multiphasic (normal).  No significant change from exams on 04/21/15 and 04/26/16.   Plan: Follow-up in 1 year with Carotid Duplex scan.    I discussed in depth with the patient the nature of atherosclerosis, and emphasized the importance of maximal medical management including strict control of blood pressure, blood glucose, and lipid levels, obtaining regular exercise, and continued cessation of smoking.   The patient is aware that without maximal medical management the underlying atherosclerotic disease process will progress, limiting the benefit of any interventions. The patient was given information about stroke prevention and what symptoms should prompt the patient to seek immediate medical care. Thank you for allowing [place] to participate in this patient's care.  Clemon Chambers, RN, MSN, FNP-C [facility] [place] Office: [phone]  Clinic Physician: Donzetta Matters  04/27/17 3:49 PM

## 2017-05-08 DIAGNOSIS — H35373 Puckering of macula, bilateral: Secondary | ICD-10-CM | POA: Diagnosis not present

## 2017-05-08 DIAGNOSIS — H43813 Vitreous degeneration, bilateral: Secondary | ICD-10-CM | POA: Diagnosis not present

## 2017-05-08 DIAGNOSIS — H353132 Nonexudative age-related macular degeneration, bilateral, intermediate dry stage: Secondary | ICD-10-CM | POA: Diagnosis not present

## 2017-05-08 DIAGNOSIS — H33193 Other retinoschisis and retinal cysts, bilateral: Secondary | ICD-10-CM | POA: Diagnosis not present

## 2017-05-16 NOTE — Addendum Note (Signed)
Addended by: Lianne Cure A on: 05/16/2017 12:56 PM   Modules accepted: Orders

## 2017-06-22 DIAGNOSIS — G4733 Obstructive sleep apnea (adult) (pediatric): Secondary | ICD-10-CM | POA: Diagnosis not present

## 2017-09-03 DIAGNOSIS — R7301 Impaired fasting glucose: Secondary | ICD-10-CM | POA: Diagnosis not present

## 2017-09-03 DIAGNOSIS — I10 Essential (primary) hypertension: Secondary | ICD-10-CM | POA: Diagnosis not present

## 2017-09-03 DIAGNOSIS — E039 Hypothyroidism, unspecified: Secondary | ICD-10-CM | POA: Diagnosis not present

## 2017-09-03 DIAGNOSIS — E782 Mixed hyperlipidemia: Secondary | ICD-10-CM | POA: Diagnosis not present

## 2017-09-07 DIAGNOSIS — E039 Hypothyroidism, unspecified: Secondary | ICD-10-CM | POA: Diagnosis not present

## 2017-09-07 DIAGNOSIS — I10 Essential (primary) hypertension: Secondary | ICD-10-CM | POA: Diagnosis not present

## 2017-09-07 DIAGNOSIS — E782 Mixed hyperlipidemia: Secondary | ICD-10-CM | POA: Diagnosis not present

## 2017-09-07 DIAGNOSIS — G2581 Restless legs syndrome: Secondary | ICD-10-CM | POA: Diagnosis not present

## 2017-09-07 DIAGNOSIS — R7301 Impaired fasting glucose: Secondary | ICD-10-CM | POA: Diagnosis not present

## 2017-09-07 DIAGNOSIS — G4733 Obstructive sleep apnea (adult) (pediatric): Secondary | ICD-10-CM | POA: Diagnosis not present

## 2017-09-07 DIAGNOSIS — G629 Polyneuropathy, unspecified: Secondary | ICD-10-CM | POA: Diagnosis not present

## 2017-09-07 DIAGNOSIS — F5101 Primary insomnia: Secondary | ICD-10-CM | POA: Diagnosis not present

## 2017-09-07 DIAGNOSIS — I2510 Atherosclerotic heart disease of native coronary artery without angina pectoris: Secondary | ICD-10-CM | POA: Diagnosis not present

## 2017-09-10 DIAGNOSIS — R7301 Impaired fasting glucose: Secondary | ICD-10-CM | POA: Diagnosis not present

## 2017-09-10 DIAGNOSIS — I10 Essential (primary) hypertension: Secondary | ICD-10-CM | POA: Diagnosis not present

## 2017-09-10 DIAGNOSIS — F5101 Primary insomnia: Secondary | ICD-10-CM | POA: Diagnosis not present

## 2017-09-10 DIAGNOSIS — I2510 Atherosclerotic heart disease of native coronary artery without angina pectoris: Secondary | ICD-10-CM | POA: Diagnosis not present

## 2017-09-10 DIAGNOSIS — G4733 Obstructive sleep apnea (adult) (pediatric): Secondary | ICD-10-CM | POA: Diagnosis not present

## 2017-09-10 DIAGNOSIS — E782 Mixed hyperlipidemia: Secondary | ICD-10-CM | POA: Diagnosis not present

## 2017-09-10 DIAGNOSIS — Z0000 Encounter for general adult medical examination without abnormal findings: Secondary | ICD-10-CM | POA: Diagnosis not present

## 2017-09-10 DIAGNOSIS — G629 Polyneuropathy, unspecified: Secondary | ICD-10-CM | POA: Diagnosis not present

## 2017-09-10 DIAGNOSIS — G2581 Restless legs syndrome: Secondary | ICD-10-CM | POA: Diagnosis not present

## 2017-09-13 DIAGNOSIS — H6123 Impacted cerumen, bilateral: Secondary | ICD-10-CM | POA: Diagnosis not present

## 2017-09-17 DIAGNOSIS — H26493 Other secondary cataract, bilateral: Secondary | ICD-10-CM | POA: Diagnosis not present

## 2017-09-17 DIAGNOSIS — H26492 Other secondary cataract, left eye: Secondary | ICD-10-CM | POA: Diagnosis not present

## 2017-09-17 DIAGNOSIS — H26491 Other secondary cataract, right eye: Secondary | ICD-10-CM | POA: Diagnosis not present

## 2017-09-17 DIAGNOSIS — Z961 Presence of intraocular lens: Secondary | ICD-10-CM | POA: Diagnosis not present

## 2017-09-17 DIAGNOSIS — H40013 Open angle with borderline findings, low risk, bilateral: Secondary | ICD-10-CM | POA: Diagnosis not present

## 2017-09-18 DIAGNOSIS — G629 Polyneuropathy, unspecified: Secondary | ICD-10-CM | POA: Diagnosis not present

## 2017-09-18 DIAGNOSIS — E782 Mixed hyperlipidemia: Secondary | ICD-10-CM | POA: Diagnosis not present

## 2017-09-18 DIAGNOSIS — I10 Essential (primary) hypertension: Secondary | ICD-10-CM | POA: Diagnosis not present

## 2017-09-18 DIAGNOSIS — G2581 Restless legs syndrome: Secondary | ICD-10-CM | POA: Diagnosis not present

## 2017-09-18 DIAGNOSIS — I2510 Atherosclerotic heart disease of native coronary artery without angina pectoris: Secondary | ICD-10-CM | POA: Diagnosis not present

## 2017-09-18 DIAGNOSIS — G4733 Obstructive sleep apnea (adult) (pediatric): Secondary | ICD-10-CM | POA: Diagnosis not present

## 2017-09-18 DIAGNOSIS — R7301 Impaired fasting glucose: Secondary | ICD-10-CM | POA: Diagnosis not present

## 2017-09-18 DIAGNOSIS — E039 Hypothyroidism, unspecified: Secondary | ICD-10-CM | POA: Diagnosis not present

## 2017-09-18 DIAGNOSIS — Z0000 Encounter for general adult medical examination without abnormal findings: Secondary | ICD-10-CM | POA: Diagnosis not present

## 2017-09-18 DIAGNOSIS — F5101 Primary insomnia: Secondary | ICD-10-CM | POA: Diagnosis not present

## 2017-10-01 DIAGNOSIS — H26492 Other secondary cataract, left eye: Secondary | ICD-10-CM | POA: Diagnosis not present

## 2017-10-15 DIAGNOSIS — E039 Hypothyroidism, unspecified: Secondary | ICD-10-CM | POA: Diagnosis not present

## 2017-10-17 DIAGNOSIS — Z6828 Body mass index (BMI) 28.0-28.9, adult: Secondary | ICD-10-CM | POA: Diagnosis not present

## 2017-10-17 DIAGNOSIS — E039 Hypothyroidism, unspecified: Secondary | ICD-10-CM | POA: Diagnosis not present

## 2017-11-06 DIAGNOSIS — H43813 Vitreous degeneration, bilateral: Secondary | ICD-10-CM | POA: Diagnosis not present

## 2017-11-06 DIAGNOSIS — H353132 Nonexudative age-related macular degeneration, bilateral, intermediate dry stage: Secondary | ICD-10-CM | POA: Diagnosis not present

## 2017-11-06 DIAGNOSIS — H35373 Puckering of macula, bilateral: Secondary | ICD-10-CM | POA: Diagnosis not present

## 2017-11-06 DIAGNOSIS — H33193 Other retinoschisis and retinal cysts, bilateral: Secondary | ICD-10-CM | POA: Diagnosis not present

## 2017-12-03 ENCOUNTER — Telehealth: Payer: Self-pay | Admitting: Orthopedic Surgery

## 2017-12-03 NOTE — Telephone Encounter (Signed)
Patient called today stating that he fell about 4 weeks ago while working in his yard.  He said he hurt his back at that time.  He did not go to the ER.  He wanted an appointment with Dr. Aline Brochure. I told him that Dr. Aline Brochure was not in the office this week.  I told him also that Dr. Aline Brochure wouldn't have an opening for a couple of weeks. I  I did ask him if he had seen his PCP and he said that he had not.    Since there is no physician in the office this week, I suggested that he call his PCP and see how he needs to proceed.  He said that his PCP was Dr. Nevada Crane.

## 2017-12-21 DIAGNOSIS — G4733 Obstructive sleep apnea (adult) (pediatric): Secondary | ICD-10-CM | POA: Diagnosis not present

## 2018-01-31 DIAGNOSIS — E782 Mixed hyperlipidemia: Secondary | ICD-10-CM | POA: Diagnosis not present

## 2018-01-31 DIAGNOSIS — I10 Essential (primary) hypertension: Secondary | ICD-10-CM | POA: Diagnosis not present

## 2018-01-31 DIAGNOSIS — R7301 Impaired fasting glucose: Secondary | ICD-10-CM | POA: Diagnosis not present

## 2018-01-31 DIAGNOSIS — F5101 Primary insomnia: Secondary | ICD-10-CM | POA: Diagnosis not present

## 2018-01-31 DIAGNOSIS — Z6828 Body mass index (BMI) 28.0-28.9, adult: Secondary | ICD-10-CM | POA: Diagnosis not present

## 2018-01-31 DIAGNOSIS — R319 Hematuria, unspecified: Secondary | ICD-10-CM | POA: Diagnosis not present

## 2018-01-31 DIAGNOSIS — I2510 Atherosclerotic heart disease of native coronary artery without angina pectoris: Secondary | ICD-10-CM | POA: Diagnosis not present

## 2018-01-31 DIAGNOSIS — G629 Polyneuropathy, unspecified: Secondary | ICD-10-CM | POA: Diagnosis not present

## 2018-01-31 DIAGNOSIS — G2581 Restless legs syndrome: Secondary | ICD-10-CM | POA: Diagnosis not present

## 2018-02-21 DIAGNOSIS — E782 Mixed hyperlipidemia: Secondary | ICD-10-CM | POA: Diagnosis not present

## 2018-02-21 DIAGNOSIS — I10 Essential (primary) hypertension: Secondary | ICD-10-CM | POA: Diagnosis not present

## 2018-02-21 DIAGNOSIS — G629 Polyneuropathy, unspecified: Secondary | ICD-10-CM | POA: Diagnosis not present

## 2018-02-21 DIAGNOSIS — R7301 Impaired fasting glucose: Secondary | ICD-10-CM | POA: Diagnosis not present

## 2018-02-21 DIAGNOSIS — Z6828 Body mass index (BMI) 28.0-28.9, adult: Secondary | ICD-10-CM | POA: Diagnosis not present

## 2018-02-21 DIAGNOSIS — G2581 Restless legs syndrome: Secondary | ICD-10-CM | POA: Diagnosis not present

## 2018-02-21 DIAGNOSIS — F5101 Primary insomnia: Secondary | ICD-10-CM | POA: Diagnosis not present

## 2018-02-21 DIAGNOSIS — Z0000 Encounter for general adult medical examination without abnormal findings: Secondary | ICD-10-CM | POA: Diagnosis not present

## 2018-02-21 DIAGNOSIS — I2510 Atherosclerotic heart disease of native coronary artery without angina pectoris: Secondary | ICD-10-CM | POA: Diagnosis not present

## 2018-02-27 DIAGNOSIS — F5101 Primary insomnia: Secondary | ICD-10-CM | POA: Diagnosis not present

## 2018-02-27 DIAGNOSIS — E782 Mixed hyperlipidemia: Secondary | ICD-10-CM | POA: Diagnosis not present

## 2018-02-27 DIAGNOSIS — I2510 Atherosclerotic heart disease of native coronary artery without angina pectoris: Secondary | ICD-10-CM | POA: Diagnosis not present

## 2018-02-27 DIAGNOSIS — I10 Essential (primary) hypertension: Secondary | ICD-10-CM | POA: Diagnosis not present

## 2018-02-27 DIAGNOSIS — R7301 Impaired fasting glucose: Secondary | ICD-10-CM | POA: Diagnosis not present

## 2018-02-27 DIAGNOSIS — G629 Polyneuropathy, unspecified: Secondary | ICD-10-CM | POA: Diagnosis not present

## 2018-02-27 DIAGNOSIS — G2581 Restless legs syndrome: Secondary | ICD-10-CM | POA: Diagnosis not present

## 2018-02-27 DIAGNOSIS — M79671 Pain in right foot: Secondary | ICD-10-CM | POA: Diagnosis not present

## 2018-02-27 DIAGNOSIS — E039 Hypothyroidism, unspecified: Secondary | ICD-10-CM | POA: Diagnosis not present

## 2018-04-04 ENCOUNTER — Other Ambulatory Visit: Payer: Self-pay | Admitting: Physician Assistant

## 2018-04-04 DIAGNOSIS — D229 Melanocytic nevi, unspecified: Secondary | ICD-10-CM | POA: Diagnosis not present

## 2018-04-04 DIAGNOSIS — D485 Neoplasm of uncertain behavior of skin: Secondary | ICD-10-CM | POA: Diagnosis not present

## 2018-04-04 DIAGNOSIS — L821 Other seborrheic keratosis: Secondary | ICD-10-CM | POA: Diagnosis not present

## 2018-04-04 DIAGNOSIS — L570 Actinic keratosis: Secondary | ICD-10-CM | POA: Diagnosis not present

## 2018-04-19 ENCOUNTER — Ambulatory Visit (INDEPENDENT_AMBULATORY_CARE_PROVIDER_SITE_OTHER): Payer: Medicare HMO | Admitting: Cardiology

## 2018-04-19 ENCOUNTER — Encounter: Payer: Self-pay | Admitting: Cardiology

## 2018-04-19 VITALS — BP 120/75 | HR 61 | Ht 73.0 in | Wt 207.8 lb

## 2018-04-19 DIAGNOSIS — I2510 Atherosclerotic heart disease of native coronary artery without angina pectoris: Secondary | ICD-10-CM | POA: Diagnosis not present

## 2018-04-19 DIAGNOSIS — I10 Essential (primary) hypertension: Secondary | ICD-10-CM | POA: Diagnosis not present

## 2018-04-19 DIAGNOSIS — G4730 Sleep apnea, unspecified: Secondary | ICD-10-CM | POA: Diagnosis not present

## 2018-04-19 DIAGNOSIS — I6523 Occlusion and stenosis of bilateral carotid arteries: Secondary | ICD-10-CM

## 2018-04-19 DIAGNOSIS — E782 Mixed hyperlipidemia: Secondary | ICD-10-CM

## 2018-04-19 MED ORDER — ATORVASTATIN CALCIUM 40 MG PO TABS: 40.0000 mg | ORAL_TABLET | Freq: Every day | ORAL | 3 refills | Status: DC

## 2018-04-19 NOTE — Progress Notes (Signed)
Clinical Summary [name] [name] is a 77 y.o.male seen today for follow up of the following medical problems.   1. CAD  - prior 3 vessel CABG July 2013, LVEF around that time by LV gram was 55%  - has had chronic MSK chest pain since his CABG that is unchanged  - no recent chest pain. No recent SOB/DOE - compliant with meds  2. Carotid stenosis  - prior right CEA July 2013  - followed by vascular surgery   - no recent neuro symptoms.   3. HTN  - remains compliant with meds   4. Hyperlipidemia  - compliant with statin, he is on atorva 20mg  daily at home.   - 02/2018 TC 154 TG 151 HDL 37LDL 87   5. OSA  - compliant CPAP - difficultly get into with Dr Luan Pulling, willing to follow in [facility]  Past Medical History:  Diagnosis Date  . CAD (coronary artery disease)   . CAD (coronary artery disease) 12/04/2011   Left Main Disease with 3- vessel CAD   . CAD (coronary artery disease)    seeing Dr Harl Bowie  . CHF (congestive heart failure) ([place])   . Deviated septum   . Dyslipidemia   . GERD (gastroesophageal reflux disease)   . Heart attack ([place])   . Hyperlipidemia   . Hypertension   . Hypothyroidism   . MI (myocardial infarction) ([place]) 1997  . PONV (postoperative nausea and vomiting)    has in past, no problems after most recent surgery  . 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 S/P carotid endarterectomy 02/29/2012   Right CEA for asymptomatic severe right ICA stenosis   . Shortness of breath    with exertion  . Sleep apnea    CPAP, sleep study at [facility]  . 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   80-99% stenosis RICA, asymptomatic  . Thyroid nodule   . Wears glasses    or contacts     No Known Allergies   Current Outpatient Medications  Medication Sig Dispense Refill  . aspirin EC 81 MG tablet Take 81 mg by mouth daily.    . diclofenac (CATAFLAM) 50 MG tablet Take 1 tablet (50 mg total) by  mouth 2 (two) times daily. 60 tablet 0  . Esomeprazole Magnesium (NEXIUM PO) OTC    . metoprolol (LOPRESSOR) 50 MG tablet Take 1 tablet (50 mg total) by mouth 2 (two) times daily. 180 tablet 1  . Multiple Vitamins-Minerals (PRESERVISION AREDS 2 PO) Take by mouth.    . Omega-3 Fatty Acids (FISH OIL) 1000 MG CPDR Take 1,000 mg by mouth 2 (two) times daily.     Marland Kitchen omeprazole (PRILOSEC) 20 MG capsule Take 20 mg by mouth daily.    . Psyllium (METAMUCIL PO) Take 3 capsules by mouth daily.     . ramipril (ALTACE) 10 MG capsule Take 1 capsule by mouth daily.    . Red Yeast Rice 600 MG CAPS Take 600 mg by mouth daily.    Marland Kitchen SYNTHROID 125 MCG tablet Take 125 mcg by mouth daily before breakfast.     . zolpidem (AMBIEN) 10 MG tablet Take 10 mg by mouth at bedtime as needed for sleep.     No current facility-administered medications for this visit.      Past Surgical History:  Procedure Laterality Date  . ADENOIDECTOMY    . CARDIAC CATHETERIZATION  02/22/12  . CARDIOVASCULAR STRESS TEST  7/13  . CAROTID ENDARTERECTOMY    . COLONOSCOPY  15-20years and 12/04/11   Done at [place]  12/11/2011   Procedure: COLONOSCOPY;  Surgeon: Rogene Houston, MD;  [facility];  Service: Endoscopy;  Laterality: N/A;  830  . CORONARY ARTERY BYPASS GRAFT  02/29/2012   Procedure: CORONARY ARTERY BYPASS GRAFTING (CABG);  Surgeon: Rexene Alberts, MD;  Location: [place];  Service: Open Heart Surgery;  Laterality: N/A;  times three using Left Internal Mammary Artery and Right Greater Saphenous Vein Graft Harvested Endoscopically  . CORONARY STENT PLACEMENT    . ENDARTERECTOMY  02/29/2012   Procedure: ENDARTERECTOMY CAROTID;  Surgeon: Angelia Mould, MD;  Location: [place];  Service: Vascular;  Laterality: Right;  . FOOT SURGERY  06/30/11   artificial joint in big toe right foot   . LEFT HEART CATHETERIZATION WITH CORONARY ANGIOGRAM N/A 02/20/2012   Procedure: LEFT HEART CATHETERIZATION  WITH CORONARY ANGIOGRAM;  Surgeon: Troy Sine, MD;  Location: [facility] CATH LAB;  Service: Cardiovascular;  Laterality: N/A;  . NASAL SEPTUM SURGERY    . Totol thyroidectomy  02/06/2011  . TRANSESOPHAGEAL ECHOCARDIOGRAM    . UPPER GASTROINTESTINAL ENDOSCOPY  35-40 years ago   [facility]     No Known Allergies    Family History  Problem Relation Age of Onset  . Hypertension Mother   . Healthy Sister   . Healthy Brother   . Healthy Son   . Heart attack Father   . Heart disease Father        Before age 45  . Hyperlipidemia Father   . Hypertension Father   . Prostate cancer Father   . Colon cancer Neg Hx      Social History [name] [name] reports that he quit smoking about 16 years ago. His smoking use included cigarettes. He has never used smokeless tobacco. [name] [name] reports that he drinks about 3.0 - 4.0 standard drinks of alcohol per week.   Review of Systems CONSTITUTIONAL: No weight loss, fever, chills, weakness or fatigue.  HEENT: Eyes: No visual loss, blurred vision, double vision or yellow sclerae.No hearing loss, sneezing, congestion, runny nose or sore throat.  SKIN: No rash or itching.  CARDIOVASCULAR: per hpi RESPIRATORY: No shortness of breath, cough or sputum.  GASTROINTESTINAL: No anorexia, nausea, vomiting or diarrhea. No abdominal pain or blood.  GENITOURINARY: No burning on urination, no polyuria NEUROLOGICAL: No headache, dizziness, syncope, paralysis, ataxia, numbness or tingling in the extremities. No change in bowel or bladder control.  MUSCULOSKELETAL: No muscle, back pain, joint pain or stiffness.  LYMPHATICS: No enlarged nodes. No history of splenectomy.  PSYCHIATRIC: No history of depression or anxiety.  ENDOCRINOLOGIC: No reports of sweating, cold or heat intolerance. No polyuria or polydipsia.  Marland Kitchen   Physical Examination Vitals:   04/19/18 1039  BP: 120/75  Pulse: 61  SpO2: 95%   Vitals:   04/19/18 1039  Weight: 207 lb 12.8 oz (94.3 kg)   Height: 6\' 1"  (1.854 m)    Gen: resting comfortably, no acute distress HEENT: no scleral icterus, pupils equal round and reactive, no palptable cervical adenopathy,  CV: RRR, no m/r/g, no jvd Resp: Clear to auscultation bilaterally GI: abdomen is soft, non-tender, non-distended, normal bowel sounds, no hepatosplenomegaly MSK: extremities are warm, no edema.  Skin: warm, no rash Neuro:  no focal deficits Psych: appropriate affect   Diagnostic Studies  02/2012 Cath 1. Normal  left ventricular function with an ejection fraction of at  least 55% with suggestion of minimal posterobasal inferior  hypocontractility.  2. Extensive coronary calcification involving the left main, left  anterior descending artery, left circumflex, and right coronary  arteries.  3. Probable ostial tapering of the left main to 60-70% with evidence  for AO pressure dampening by 10-15 mm concordant with ostial  stenosis.  4. 40% proximal circumflex with 70% circumflex marginal 1 stenosis.  5. Total occlusion of the right coronary artery at the ostium with  extensive collateralization to the posterior descending artery  vessel via the left coronary circulation.   03/2013 Echo LVEF 55-60%, no WMAs, grade I diastolic dysfunction, mild MR   02/2013 Carotid US Patent right CEA, <40% stenosis on left.   04/2015 Carotid US Patent RICA/CEA, LICA 40-37%   Assessment and Plan   1. CAD  - no symptoms, continue current meds - EKG shows SR, isolated PVC, no acute ischemic changes  2. Carotid stenosis  - follow up with vascular  - continue medical therapy   3. HTN  - at goal, continue current meds  4. Hyperlipidemia  - given extensive CAD/carotid stenosis history would favor LDL <70, at least moderate strength statin - increase atorva to 40mg  daily.   5. OSA - long history, has not been followed in some time. Has had some issues with his machine - we will refer to Dr Radford Pax  to help manage  F/u 1 year     Arnoldo Lenis, M.D.

## 2018-04-19 NOTE — Patient Instructions (Signed)
Medication Instructions:  INCREASE LIPITOR TO 40 MG DAILY   Labwork: NONE  Testing/Procedures: NONE  Follow-Up: Your physician wants you to follow-up in: 1 YEAR.  You will receive a reminder letter in the mail two months in advance. If you don't receive a letter, please call our office to schedule the follow-up appointment.   Any Other Special Instructions Will Be Listed Below (If Applicable). You have been referred to [facility] [address]     If you need a refill on your cardiac medications before your next appointment, please call your pharmacy.

## 2018-04-30 ENCOUNTER — Encounter: Payer: Self-pay | Admitting: Family

## 2018-04-30 ENCOUNTER — Other Ambulatory Visit: Payer: Self-pay

## 2018-04-30 ENCOUNTER — Ambulatory Visit (INDEPENDENT_AMBULATORY_CARE_PROVIDER_SITE_OTHER): Payer: Medicare HMO | Admitting: Family

## 2018-04-30 ENCOUNTER — Ambulatory Visit (HOSPITAL_COMMUNITY)
Admission: RE | Admit: 2018-04-30 | Discharge: 2018-04-30 | Disposition: A | Discharge status: Home / Self Care | Payer: Medicare HMO | Source: Ambulatory Visit | Attending: Vascular Surgery | Admitting: Vascular Surgery

## 2018-04-30 VITALS — BP 130/82 | HR 48 | Temp 97.3°F | Resp 18 | Ht 74.0 in | Wt 207.0 lb

## 2018-04-30 DIAGNOSIS — Z9889 Other specified postprocedural states: Secondary | ICD-10-CM | POA: Diagnosis not present

## 2018-04-30 DIAGNOSIS — I6523 Occlusion and stenosis of bilateral carotid arteries: Secondary | ICD-10-CM

## 2018-04-30 DIAGNOSIS — Z87891 Personal history of nicotine dependence: Secondary | ICD-10-CM | POA: Insufficient documentation

## 2018-04-30 NOTE — Progress Notes (Signed)
Chief Complaint: Follow up Extracranial Carotid Artery Stenosis   History of Present Illness  [name] [name] is a 77 y.o. male who was found have a greater than 80% right carotid stenosis. Preoperative cardiac evaluation showed evidence of significant coronary disease. He underwent combined right carotid endarterectomy and coronary revascularization on 02/29/2012 by Dr. Scot Dock and Dr. Roxy Manns.  He comes in for a follow up visit.  He complains of tingling, numbness, and burning sensation in his hands and feet that he states started about 6 months afterhis open heart surgery, worse at night; was being evaluated by [facility]. He states his balance problem is stable.   He denies claudication symptoms with walking. He is usually physically active.   Patient has no history of TIA or stroke symptoms.Specifically he denies a history of amaurosis fugax or monocular blindness, unilateral facial drooping, hemiplegia, or receptive or expressive aphasia.   He uses a C-PAP, he states he needs a new machine, has had over 10 years, states he sleeps only 4 hours/day. States he is scheduled to get another sleep study.   He has had 2 surgeries on his right foot, states his right foot is painful and he limits his walking due to this. He states he will contact [facility] to re-evaluate this.    Diabetic: 6.0 A1C in July 2019 (review of records from his PCP) Tobacco use: former smoker, quit in the 1980's, his wife smokes outside the house.  Pt meds include: Statin : Yes ASA: Yes Other anticoagulants/antiplatelets: no   Past Medical History:  Diagnosis Date  . CAD (coronary artery disease)   . CAD (coronary artery disease) 12/04/2011   Left Main Disease with 3- vessel CAD   . CAD (coronary artery disease)    seeing Dr Harl Bowie  . CHF (congestive heart failure) ([place])   . Deviated septum   . Dyslipidemia   . GERD (gastroesophageal reflux disease)   . Heart attack ([place])   .  Hyperlipidemia   . Hypertension   . Hypothyroidism   . MI (myocardial infarction) ([place]) 1997  . PONV (postoperative nausea and vomiting)    has in past, no problems after most recent surgery  . 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 S/P carotid endarterectomy 02/29/2012   Right CEA for asymptomatic severe right ICA stenosis   . Shortness of breath    with exertion  . Sleep apnea    CPAP, sleep study at [facility]  . 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   80-99% stenosis RICA, asymptomatic  . Thyroid nodule   . Wears glasses    or contacts    Social History Social History   Tobacco Use  . Smoking status: Former Smoker    Types: Cigarettes    Last attempt to quit: 09/04/2001    Years since quitting: 16.6  . Smokeless tobacco: Never Used  . Tobacco comment: smoked 57yrs, smoked 1/2 pack a day  Substance Use Topics  . Alcohol use: Not Currently    Alcohol/week: 3.0 - 4.0 standard drinks    Types: 3 - 4 Cans of beer per week    Comment: social- beer , not on regular basis  . Drug use: No    Family History Family History  Problem Relation Age of Onset  . Hypertension Mother   . Healthy Sister   . Healthy Brother   . Healthy Son   .  Heart attack Father   . Heart disease Father        Before age 58  . Hyperlipidemia Father   . Hypertension Father   . Prostate cancer Father   . Colon cancer Neg Hx     Surgical History Past Surgical History:  Procedure Laterality Date  . ADENOIDECTOMY    . CARDIAC CATHETERIZATION     02/22/12  . CARDIOVASCULAR STRESS TEST  7/13  . CAROTID ENDARTERECTOMY    . COLONOSCOPY  15-20years and 12/04/11   Done at [place]  12/11/2011   Procedure: COLONOSCOPY;  Surgeon: Rogene Houston, MD;  [facility];  Service: Endoscopy;  Laterality: N/A;  830  . CORONARY ARTERY BYPASS GRAFT  02/29/2012   Procedure: CORONARY ARTERY BYPASS GRAFTING (CABG);  Surgeon:  Rexene Alberts, MD;  Location: [place];  Service: Open Heart Surgery;  Laterality: N/A;  times three using Left Internal Mammary Artery and Right Greater Saphenous Vein Graft Harvested Endoscopically  . CORONARY STENT PLACEMENT    . ENDARTERECTOMY  02/29/2012   Procedure: ENDARTERECTOMY CAROTID;  Surgeon: Angelia Mould, MD;  Location: [place];  Service: Vascular;  Laterality: Right;  . FOOT SURGERY  06/30/11   artificial joint in big toe right foot   . LEFT HEART CATHETERIZATION WITH CORONARY ANGIOGRAM N/A 02/20/2012   Procedure: LEFT HEART CATHETERIZATION WITH CORONARY ANGIOGRAM;  Surgeon: Troy Sine, MD;  Location: [facility] CATH LAB;  Service: Cardiovascular;  Laterality: N/A;  . NASAL SEPTUM SURGERY    . Totol thyroidectomy  02/06/2011  . TRANSESOPHAGEAL ECHOCARDIOGRAM    . UPPER GASTROINTESTINAL ENDOSCOPY  35-40 years ago   [facility]    No Known Allergies  Current Outpatient Medications  Medication Sig Dispense Refill  . aspirin EC 81 MG tablet Take 81 mg by mouth daily.    Marland Kitchen atorvastatin (LIPITOR) 40 MG tablet Take 1 tablet (40 mg total) by mouth daily. 90 tablet 3  . metoprolol (LOPRESSOR) 50 MG tablet Take 1 tablet (50 mg total) by mouth 2 (two) times daily. 180 tablet 1  . Multiple Vitamins-Minerals (PRESERVISION AREDS 2 PO) Take by mouth.    . Omega-3 Fatty Acids (FISH OIL) 1000 MG CPDR Take 1,000 mg by mouth 2 (two) times daily.     Marland Kitchen omeprazole (PRILOSEC) 20 MG capsule Take 20 mg by mouth daily.    . Psyllium (METAMUCIL PO) Take 3 capsules by mouth daily.     . ramipril (ALTACE) 10 MG capsule Take 1 capsule by mouth daily.    Marland Kitchen SYNTHROID 125 MCG tablet Take 112.5 mcg by mouth daily before breakfast.     . zolpidem (AMBIEN) 10 MG tablet Take 10 mg by mouth at bedtime as needed for sleep.    Marland Kitchen diclofenac (CATAFLAM) 50 MG tablet Take 1 tablet (50 mg total) by mouth 2 (two) times daily. (Patient not taking: Reported on 04/30/2018) 60 tablet 0  . Esomeprazole Magnesium (NEXIUM  PO) OTC     No current facility-administered medications for this visit.     Review of Systems : See HPI for pertinent positives and negatives.  Physical Examination  Vitals:   04/30/18 1352 04/30/18 1356 04/30/18 1358  BP: (!) 144/79 136/80 130/82  Pulse: (!) 56 (!) 48 (!) 48  Resp: 18    Temp: (!) 97.3 F (36.3 C)    TempSrc: Oral    SpO2: 98%    Weight: 207 lb (93.9 kg)    Height:  6\' 2"  (1.88 m)     Body mass index is 26.58 kg/m.  General: WDWN male in NAD GAIT:normal HENT: no gross abnormalities  Eyes: PERRLA Pulmonary:Respirations are non-labored, CTAB, good air movement, no rales, no rhonchi, &no wheezing. Cardiac: regular rhythm, bradycardic (on a beta blocker), no detected murmur.  VASCULAR EXAM Carotid Bruits Right Left   Negative Negative   Radial pulses are 2+ palpable and equal. Abdominal aortic pulse is not palpable       LE Pulses Right Left   POPLITEAL 1+ palpable  not palpable   POSTERIOR TIBIAL 2+ palpable  2+ palpable    DORSALIS PEDIS  ANTERIOR TIBIAL not palpable  not palpable     Gastrointestinal: soft, nontender, BS WNL, no r/g, no palpable masses. Musculoskeletal: Nomuscle atrophy/wasting. M/S 5/5 throughout, Extremities without ischemic changes. Skin: No rashes, no ulcers, no cellulitis.   Neurologic:  A&O X 3; appropriate affect, sensation is normal; speech is loud, CN 2-12 intact except has some hearing loss, pain and light touch intact in extremities, motor exam as listed above. Psychiatric: Normal thought content, mood appropriate to clinical situation. Loquacious.    Assessment: [name] [name] is a 77 y.o. male who is s/pright carotid endarterectomy and coronary revascularization on 02/29/2012. He has no history of stroke or TIA.  Fortunately he  quit smoking in the 1980's. He has pre-diabetes.  He takes a daily ASA and a statin.    DATA Carotid Duplex (04-30-18): Patent right internal carotid artery with history of carotid endarterectomy, no significant stenosis present,and <40%left internal carotid artery stenosis. Right vertebral artery flow is antegrade, left is not identified.  Bilateral subclavian artery waveforms are normal.  Left vertebral artery was antegrade compared to the last exam on 04-27-17, otherwise no significant changes.    Plan: Follow-up in 18 months with Carotid Duplex scan.  I discussed in depth with the patient the nature of atherosclerosis, and emphasized the importance of maximal medical management including strict control of blood pressure, blood glucose, and lipid levels, obtaining regular exercise, and continued cessation of smoking.  The patient is aware that without maximal medical management the underlying atherosclerotic disease process will progress, limiting the benefit of any interventions. The patient was given information about stroke prevention and what symptoms should prompt the patient to seek immediate medical care. Thank you for allowing [place] to participate in this patient's care.  Clemon Chambers, RN, MSN, FNP-C [facility] Office: [phone]  Clinic Physician: Bishop Dublin  04/30/18 1:59 PM

## 2018-04-30 NOTE — Patient Instructions (Signed)

## 2018-06-17 ENCOUNTER — Other Ambulatory Visit: Payer: Self-pay | Admitting: Cardiology

## 2018-06-17 MED ORDER — ATORVASTATIN CALCIUM 40 MG PO TABS: 40.0000 mg | ORAL_TABLET | Freq: Every day | ORAL | 3 refills | Status: DC

## 2018-06-17 NOTE — Telephone Encounter (Signed)
Pt is needing his Rx for atorvastatin (LIPITOR) 40 MG tablet [[id number]]  Sent to [facility] mail order

## 2018-06-24 DIAGNOSIS — G4733 Obstructive sleep apnea (adult) (pediatric): Secondary | ICD-10-CM | POA: Diagnosis not present

## 2018-08-21 DIAGNOSIS — H353132 Nonexudative age-related macular degeneration, bilateral, intermediate dry stage: Secondary | ICD-10-CM | POA: Diagnosis not present

## 2018-08-21 DIAGNOSIS — H33193 Other retinoschisis and retinal cysts, bilateral: Secondary | ICD-10-CM | POA: Diagnosis not present

## 2018-08-21 DIAGNOSIS — H35373 Puckering of macula, bilateral: Secondary | ICD-10-CM | POA: Diagnosis not present

## 2018-08-21 DIAGNOSIS — H43813 Vitreous degeneration, bilateral: Secondary | ICD-10-CM | POA: Diagnosis not present

## 2018-08-22 ENCOUNTER — Ambulatory Visit: Payer: Medicare HMO | Admitting: Cardiology

## 2018-08-29 DIAGNOSIS — E039 Hypothyroidism, unspecified: Secondary | ICD-10-CM | POA: Diagnosis not present

## 2018-08-29 DIAGNOSIS — R7301 Impaired fasting glucose: Secondary | ICD-10-CM | POA: Diagnosis not present

## 2018-08-29 DIAGNOSIS — E782 Mixed hyperlipidemia: Secondary | ICD-10-CM | POA: Diagnosis not present

## 2018-08-29 DIAGNOSIS — I10 Essential (primary) hypertension: Secondary | ICD-10-CM | POA: Diagnosis not present

## 2018-09-02 ENCOUNTER — Encounter: Payer: Self-pay | Admitting: Cardiology

## 2018-09-02 ENCOUNTER — Ambulatory Visit (INDEPENDENT_AMBULATORY_CARE_PROVIDER_SITE_OTHER): Payer: Medicare HMO | Admitting: Cardiology

## 2018-09-02 ENCOUNTER — Ambulatory Visit: Payer: Medicare HMO | Admitting: Cardiology

## 2018-09-02 ENCOUNTER — Telehealth: Payer: Self-pay | Admitting: *Deleted

## 2018-09-02 ENCOUNTER — Encounter (INDEPENDENT_AMBULATORY_CARE_PROVIDER_SITE_OTHER): Payer: Self-pay

## 2018-09-02 VITALS — BP 160/90 | HR 61 | Ht 74.0 in | Wt 210.0 lb

## 2018-09-02 DIAGNOSIS — G4733 Obstructive sleep apnea (adult) (pediatric): Secondary | ICD-10-CM | POA: Diagnosis not present

## 2018-09-02 DIAGNOSIS — I10 Essential (primary) hypertension: Secondary | ICD-10-CM | POA: Diagnosis not present

## 2018-09-02 DIAGNOSIS — E669 Obesity, unspecified: Secondary | ICD-10-CM | POA: Diagnosis not present

## 2018-09-02 HISTORY — DX: Obesity, unspecified: E66.9

## 2018-09-02 NOTE — Addendum Note (Signed)
Addended by: Sarina Ill on: 09/02/2018 11:08 AM   Modules accepted: Orders

## 2018-09-02 NOTE — Progress Notes (Signed)
Cardiology Office Note:    Date:  09/02/2018   ID:  [name] [name], DOB 01/30/1941, MRN [id number]  PCP:  [name] Squibb, MD  Cardiologist:  No primary care provider on file.    Referring MD: [name] Squibb, MD   Chief Complaint  Patient presents with  . Sleep Apnea    History of Present Illness:    [name] [name] is a 78 y.o. male with a hx of ? OSA on CPAP.  He says that he had a sleep study done at [name] in 2012 and has been on CPAP therapy since then.  His last sleep study was in 2016 which was done at [name] because he was having problems sleeping because of neuropathy.  He thought that he used his sleep device during that sleep study but according to the report he was not using his sleep device and it was done on room air and showed an overall normal AHI of 3.7/h with moderate oxygen desaturations when sleeping supine.  He is doing well with his CPAP device and thinks that he has gotten used to it.  He tolerates the mask and feels the pressure is adequate.  Since going on CPAP he feels rested in the am and has no significant daytime sleepiness.  He denies any significant mouth or nasal dryness or nasal congestion.  He does not think that he snores.     Past Medical History:  Diagnosis Date  . CAD (coronary artery disease)   . CAD (coronary artery disease) 12/04/2011   Left Main Disease with 3- vessel CAD   . CAD (coronary artery disease)    seeing Dr [name] [name]  . CHF (congestive heart failure) ([name])   . Deviated septum   . Dyslipidemia   . GERD (gastroesophageal reflux disease)   . Heart attack ([place])   . Hyperlipidemia   . Hypertension   . Hypothyroidism   . MI (myocardial infarction) ([place]) 1997  . Obesity (BMI 30-39.9) 09/02/2018  . PONV (postoperative nausea and vomiting)    has in past, no problems after most recent surgery  . 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 S/P carotid  endarterectomy 02/29/2012   Right CEA for asymptomatic severe right ICA stenosis   . Shortness of breath    with exertion  . Sleep apnea    CPAP, sleep study at [name]  . 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   80-99% stenosis RICA, asymptomatic  . Thyroid nodule   . Wears glasses    or contacts    Past Surgical History:  Procedure Laterality Date  . ADENOIDECTOMY    . CARDIAC CATHETERIZATION     02/22/12  . CARDIOVASCULAR STRESS TEST  7/13  . CAROTID ENDARTERECTOMY    . COLONOSCOPY  15-20years and 12/04/11   Done at [place]  12/11/2011   Procedure: COLONOSCOPY;  Surgeon: [name] Houston, MD;  [facility];  Service: Endoscopy;  Laterality: N/A;  830  . CORONARY ARTERY BYPASS GRAFT  02/29/2012   Procedure: CORONARY ARTERY BYPASS GRAFTING (CABG);  Surgeon: [name] Alberts, MD;  Location: [place];  Service: Open Heart Surgery;  Laterality: N/A;  times three using Left Internal Mammary Artery and Right Greater Saphenous Vein Graft Harvested Endoscopically  . CORONARY STENT PLACEMENT    . ENDARTERECTOMY  02/29/2012   Procedure: ENDARTERECTOMY CAROTID;  Surgeon: [name] Mould, MD;  Location: [place];  Service: Vascular;  Laterality: Right;  . FOOT SURGERY  06/30/11   artificial joint in big toe right foot   . LEFT HEART CATHETERIZATION WITH CORONARY ANGIOGRAM N/A 02/20/2012   Procedure: LEFT HEART CATHETERIZATION WITH CORONARY ANGIOGRAM;  Surgeon: [name] Sine, MD;  Location: [facility] CATH LAB;  Service: Cardiovascular;  Laterality: N/A;  . NASAL SEPTUM SURGERY    . Totol thyroidectomy  02/06/2011  . TRANSESOPHAGEAL ECHOCARDIOGRAM    . UPPER GASTROINTESTINAL ENDOSCOPY  35-40 years ago   [facility]    Current Medications: Current Meds  Medication Sig  . aspirin EC 81 MG tablet Take 81 mg by mouth daily.  Marland Kitchen atorvastatin (LIPITOR) 40 MG tablet Take 1 tablet (40 mg total) by mouth daily.  . Esomeprazole Magnesium (NEXIUM PO) OTC  . metoprolol  (LOPRESSOR) 50 MG tablet Take 1 tablet (50 mg total) by mouth 2 (two) times daily.  . Multiple Vitamins-Minerals (PRESERVISION AREDS 2 PO) Take by mouth.  . Omega-3 Fatty Acids (FISH OIL) 1000 MG CPDR Take 1,000 mg by mouth 2 (two) times daily.   Marland Kitchen omeprazole (PRILOSEC) 20 MG capsule Take 20 mg by mouth daily.  . Psyllium (METAMUCIL PO) Take 3 capsules by mouth daily.   . ramipril (ALTACE) 10 MG capsule Take 1 capsule by mouth daily.  Marland Kitchen SYNTHROID 112 MCG tablet Take 1 tablet by mouth daily.  Marland Kitchen zolpidem (AMBIEN) 10 MG tablet Take 10 mg by mouth at bedtime as needed for sleep.  . [DISCONTINUED] SYNTHROID 125 MCG tablet Take 112.5 mcg by mouth daily before breakfast.      Allergies:   Patient has no known allergies.   Social History   Socioeconomic History  . Marital status: Married    Spouse name: [name] [name]  . Number of children: 1  . Years of education: 30  . Highest education level: Not on file  Occupational History    Comment: retired  Scientific laboratory technician  . Financial resource strain: Not on file  . Food insecurity:    Worry: Not on file    Inability: Not on file  . Transportation needs:    Medical: Not on file    Non-medical: Not on file  Tobacco Use  . Smoking status: Former Smoker    Types: Cigarettes    Last attempt to quit: 09/04/2001    Years since quitting: 17.0  . Smokeless tobacco: Never Used  . Tobacco comment: smoked 71yrs, smoked 1/2 pack a day  Substance and Sexual Activity  . Alcohol use: Not Currently    Alcohol/week: 3.0 - 4.0 standard drinks    Types: 3 - 4 Cans of beer per week    Comment: social- beer , not on regular basis  . Drug use: No  . Sexual activity: Not on file  Lifestyle  . Physical activity:    Days per week: Not on file    Minutes per session: Not on file  . Stress: Not on file  Relationships  . Social connections:    Talks on phone: Not on file    Gets together: Not on file    Attends religious service: Not on file    Active member of club  or organization: Not on file    Attends meetings of clubs or organizations: Not on file    Relationship status: Not on file  Other Topics Concern  . Not on file  Social History Narrative   Consumes 2-3 cups  of caffeine daily     Family History: The patient's family history includes Healthy in his brother, sister, and son; Heart attack in his father; Heart disease in his father; Hyperlipidemia in his father; Hypertension in his father and mother; Prostate cancer in his father. There is no history of Colon cancer.  ROS:   Please see the history of present illness.    ROS  All other systems reviewed and negative.   EKGs/Labs/Other Studies Reviewed:    The following studies were reviewed today: PAP download  EKG:  EKG is not ordered today.    Recent Labs: No results found for requested labs within last 8760 hours.   Recent Lipid Panel    Component Value Date/Time   CHOL 125 03/19/2013 0902   TRIG 108 03/19/2013 0902   HDL 41 03/19/2013 0902   CHOLHDL 3.0 03/19/2013 0902   VLDL 22 03/19/2013 0902   LDLCALC 62 03/19/2013 0902    Physical Exam:    VS:  BP (!) 160/90   Pulse 61   Ht 6\' 2"  (1.88 m)   Wt 210 lb (95.3 kg)   SpO2 96%   BMI 26.96 kg/m     Wt Readings from Last 3 Encounters:  09/02/18 210 lb (95.3 kg)  04/30/18 207 lb (93.9 kg)  04/19/18 207 lb 12.8 oz (94.3 kg)     GEN:  Well nourished, well developed in no acute distress HEENT: Normal NECK: No JVD; No carotid bruits LYMPHATICS: No lymphadenopathy CARDIAC: RRR, no murmurs, rubs, gallops RESPIRATORY:  Clear to auscultation without rales, wheezing or rhonchi  ABDOMEN: Soft, non-tender, non-distended MUSCULOSKELETAL:  No edema; No deformity  SKIN: Warm and dry NEUROLOGIC:  Alert and oriented x 3 PSYCHIATRIC:  Normal affect   ASSESSMENT:    1. OSA (obstructive sleep apnea)   2. Essential hypertension   3. Obesity (BMI 30-39.9)    PLAN:    In order of problems listed above:  1.  OSA - the  patient is tolerating PAP therapy well without any problems.  Because his machine is so old we cannot get a download with a chip that he has.  He is requesting a new CPAP device because his is so old.  Since he has not had a sleep study in over 8 years I have recommended proceeding with a split-night sleep study to document sleep apnea and then titration if he does have sleep apnea to determine what pressure he would best be suited for.  2.  HTN -BP is borderline controlled on exam today.  He will continue on ramipril 10 mg daily and Lopressor 50 mg twice daily.  3.  Obesity - I have encouraged hin to get into a routine exercise program and cut back on carbs and portions.    Medication Adjustments/Labs and Tests Ordered: Current medicines are reviewed at length with the patient today.  Concerns regarding medicines are outlined above.  No orders of the defined types were placed in this encounter.  No orders of the defined types were placed in this encounter.   Signed, Fransico Him, MD  09/02/2018 11:02 AM    [place]

## 2018-09-02 NOTE — Telephone Encounter (Signed)
-----   Message from Sarina Ill, RN sent at 09/02/2018  4:30 PM EST ----- Regarding: Sleep Study Hello, Dr. Radford Pax ordered the patient to have a sleep study. His ESS: 10. Please precert and schedule.  Thanks, [employer]

## 2018-09-02 NOTE — Patient Instructions (Signed)
Medication Instructions:  Your physician recommends that you continue on your current medications as directed. Please refer to the Current Medication list given to you today.  If you need a refill on your cardiac medications before your next appointment, please call your pharmacy.   Lab work: None If you have labs (blood work) drawn today and your tests are completely normal, you will receive your results only by: Marland Kitchen MyChart Message (if you have MyChart) OR . A paper copy in the mail If you have any lab test that is abnormal or we need to change your treatment, we will call you to review the results.  Testing/Procedures: Your physician has recommended that you have a sleep study. This test records several body functions during sleep, including: brain activity, eye movement, oxygen and carbon dioxide blood levels, heart rate and rhythm, breathing rate and rhythm, the flow of air through your mouth and nose, snoring, body muscle movements, and chest and belly movement.  Follow-Up: After sleep study we will contact you with the results and plan.

## 2018-09-03 ENCOUNTER — Telehealth: Payer: Self-pay | Admitting: *Deleted

## 2018-09-03 DIAGNOSIS — N183 Chronic kidney disease, stage 3 (moderate): Secondary | ICD-10-CM | POA: Diagnosis not present

## 2018-09-03 DIAGNOSIS — Z0000 Encounter for general adult medical examination without abnormal findings: Secondary | ICD-10-CM | POA: Diagnosis not present

## 2018-09-03 DIAGNOSIS — G4733 Obstructive sleep apnea (adult) (pediatric): Secondary | ICD-10-CM | POA: Diagnosis not present

## 2018-09-03 DIAGNOSIS — G4700 Insomnia, unspecified: Secondary | ICD-10-CM | POA: Diagnosis not present

## 2018-09-03 DIAGNOSIS — E782 Mixed hyperlipidemia: Secondary | ICD-10-CM | POA: Diagnosis not present

## 2018-09-03 DIAGNOSIS — I10 Essential (primary) hypertension: Secondary | ICD-10-CM | POA: Diagnosis not present

## 2018-09-03 DIAGNOSIS — G2581 Restless legs syndrome: Secondary | ICD-10-CM | POA: Diagnosis not present

## 2018-09-03 DIAGNOSIS — I2510 Atherosclerotic heart disease of native coronary artery without angina pectoris: Secondary | ICD-10-CM | POA: Diagnosis not present

## 2018-09-03 DIAGNOSIS — E039 Hypothyroidism, unspecified: Secondary | ICD-10-CM | POA: Diagnosis not present

## 2018-09-03 NOTE — Telephone Encounter (Signed)
Sleep study PA submitted via web portal to [facility].

## 2018-09-03 NOTE — Telephone Encounter (Signed)
-----   Message from Sarina Ill, RN sent at 09/02/2018  4:30 PM EST ----- Regarding: Sleep Study Hello, Dr. Radford Pax ordered the patient to have a sleep study. His ESS: 10. Please precert and schedule.  Thanks, [employer]

## 2018-09-04 ENCOUNTER — Telehealth: Payer: Self-pay | Admitting: *Deleted

## 2018-09-04 NOTE — Telephone Encounter (Signed)
Staff message sent to [facility] only approved for patient to have a CPAP titration study. states [facility]SP[facility] not needed. Auth # [id number]. Valid dates 09/23/18 to 10/23/18.  Per [facility] web portal does not have notification requirements.

## 2018-09-04 NOTE — Telephone Encounter (Signed)
-----   Message from Sarina Ill, RN sent at 09/02/2018  4:30 PM EST ----- Regarding: Sleep Study Hello, Dr. Radford Pax ordered the patient to have a sleep study. His ESS: 10. Please precert and schedule.  Thanks, [employer]

## 2018-09-09 NOTE — Addendum Note (Signed)
Addended by: Freada Bergeron on: 09/09/2018 06:13 PM   Modules accepted: Orders

## 2018-09-09 NOTE — Telephone Encounter (Signed)
Staff message sent to [facility] only approved for patient to have a CPAP titration study. states NSPG not needed. Auth # [id number]. Valid dates 09/23/18 to 10/23/18.  Per [facility] web portal does not have notification requirements.

## 2018-09-11 NOTE — Telephone Encounter (Signed)
RE: Sleep Study  [name] Margarita, MD  Freada Bergeron, CMA        Please order in lab CPAP titration   [name] [name]

## 2018-09-11 NOTE — Telephone Encounter (Signed)
cpap titration study ordered today.

## 2018-09-11 NOTE — Telephone Encounter (Addendum)
Patient is scheduled for CPAP Titration on 09/24/18. Patient understands his titration study will be done at [facility] sleep lab. Patient understands he will receive a letter in a week or so detailing appointment, date, time, and location. Patient understands to call if he does not receive the letter  in a timely manner. Left detailed message on voicemail with date and time of titration and informed patient to call back to confirm or reschedule.    Staff message sent to [facility] only approved for patient to have a CPAP titration study. states NSPG not needed. Auth # [id number]. Valid dates 09/23/18 to 10/23/18.  Per [facility] web portal does not have notification requirements.

## 2018-09-13 ENCOUNTER — Encounter: Payer: Self-pay | Admitting: *Deleted

## 2018-09-24 ENCOUNTER — Ambulatory Visit: Payer: Medicare HMO | Attending: Cardiology | Admitting: Cardiology

## 2018-09-24 DIAGNOSIS — G4733 Obstructive sleep apnea (adult) (pediatric): Secondary | ICD-10-CM | POA: Diagnosis not present

## 2018-09-29 NOTE — Procedures (Signed)
   Patient Name: [name] [name], [name] [name] Date: 09/24/2018   Gender: Male  D.O.B: 1940-11-01  Age (years): 73  Referring Provider: Fransico Him MD, ABSM  Height (inches): 74  Interpreting Physician: Fransico Him MD, ABSM  Weight (lbs): 210  RPSGT: Peak, Robert  BMI: 27  MRN: [id number]  Neck Size: 17.00   CLINICAL INFORMATION  The patient is referred for a CPAP titration to treat sleep apnea.  SLEEP STUDY TECHNIQUE  As per the AASM Manual for the Scoring of Sleep and Associated Events v2.3 (April 2016) with a hypopnea requiring 4% desaturations. The channels recorded and monitored were frontal, central and occipital EEG, electrooculogram (EOG), submentalis EMG (chin), nasal and oral airflow, thoracic and abdominal wall motion, anterior tibialis EMG, snore microphone, electrocardiogram, and pulse oximetry. Continuous positive airway pressure (CPAP) was initiated at the beginning of the study and titrated to treat sleep-disordered breathing. MEDICATIONS  Medications self-administered by patient taken the night of the study : N/A  TECHNICIAN COMMENTS  Comments added by technician: Patient had difficulty initiating sleep. Comments added by scorer: N/A   RESPIRATORY PARAMETERS  Optimal PAP Pressure (cm): 10 AHI at Optimal Pressure (/hr): 0.0  Overall Minimal O2 (%): 91.0 Supine % at Optimal Pressure (%): 0  Minimal O2 at Optimal Pressure (%): 93.0       SLEEP ARCHITECTURE  The study was initiated at 9:45:55 PM and ended at 4:15:02 AM. Sleep onset time was 24.1 minutes and the sleep efficiency was 23.3%%. The total sleep time was 90.5 minutes. The patient spent 29.8% of the night in stage N1 sleep, 70.2% in stage N2 sleep, 0.0% in stage N3 and 0% in REM.Stage REM latency was N/A minutes Wake after sleep onset was 274.5. Alpha intrusion was absent. Supine sleep was 0.00%.  CARDIAC DATA  The 2 lead EKG demonstrated sinus rhythm. The mean heart rate was 49.1 beats per minute. Other EKG  findings include: None.   LEG MOVEMENT DATA  The total Periodic Limb Movements of Sleep (PLMS) were 0. The PLMS index was 0.0. A PLMS index of <15 is considered normal in adults.  IMPRESSIONS  - The optimal PAP pressure was 10 cm of water.  - Central sleep apnea was not noted during this titration (CAI = 0.0/h).  - Significant oxygen desaturations were not observed during this titration (min O2 = 91.0%).  - No snoring was audible during this study.  - No cardiac abnormalities were observed during this study.  - Clinically significant periodic limb movements were not noted during this study. Arousals associated with PLMs were rare.  DIAGNOSIS  - Obstructive Sleep Apnea (327.23 [G47.33 ICD-10])  RECOMMENDATIONS  - Trial of CPAP therapy on 10 cm H2O with a Medium size Resmed Full Face Mask AirFit F30 mask and heated humidification. - Avoid alcohol, sedatives and other CNS depressants that may worsen sleep apnea and disrupt normal sleep architecture.  - Sleep hygiene should be reviewed to assess factors that may improve sleep quality.  - Weight management and regular exercise should be initiated or continued.  - Return to Sleep Center for re-evaluation after 10 weeks of therapy  [Electronically signed] 09/29/2018 08:54 PM Fransico Him MD, ABSM  Diplomate, American Board of Sleep Medicine

## 2018-10-03 DIAGNOSIS — Z961 Presence of intraocular lens: Secondary | ICD-10-CM | POA: Diagnosis not present

## 2018-10-03 DIAGNOSIS — H33101 Unspecified retinoschisis, right eye: Secondary | ICD-10-CM | POA: Diagnosis not present

## 2018-10-03 DIAGNOSIS — H353131 Nonexudative age-related macular degeneration, bilateral, early dry stage: Secondary | ICD-10-CM | POA: Diagnosis not present

## 2018-10-03 DIAGNOSIS — H40013 Open angle with borderline findings, low risk, bilateral: Secondary | ICD-10-CM | POA: Diagnosis not present

## 2018-10-04 ENCOUNTER — Telehealth: Payer: Self-pay | Admitting: *Deleted

## 2018-10-04 NOTE — Telephone Encounter (Signed)
-----   Message from Sueanne Margarita, MD sent at 09/29/2018  8:58 PM EST ----- Please let patient know that they had a successful PAP titration and let DME know that orders are in EPIC.  Please set up 10 week OV with me.

## 2018-10-04 NOTE — Telephone Encounter (Signed)
Called results lmtcb. 

## 2018-10-07 NOTE — Telephone Encounter (Signed)
Informed patient of titration results and verbalized understanding was indicated. Patient understands his sleep trittation showed  they had a successful PAP titration and orders are in EPIC. Please set up 10 week OV with me.  Pt is aware and agreeable to these results.  Upon patient request DME selection is [facility]. Patient understands he will be contacted by [facility] to set up his cpap. Patient understands to call if [facility] does not contact him with new setup in a timely manner. Patient understands they will be called once confirmation has been received from [facility] that they have received their new machine to schedule 10 week follow up appointment.  [facility] notified of new cpap order  Please add to airview Patient was grateful for the call and thanked me.

## 2018-10-17 DIAGNOSIS — G4733 Obstructive sleep apnea (adult) (pediatric): Secondary | ICD-10-CM | POA: Diagnosis not present

## 2018-11-17 DIAGNOSIS — G4733 Obstructive sleep apnea (adult) (pediatric): Secondary | ICD-10-CM | POA: Diagnosis not present

## 2018-12-11 DIAGNOSIS — G4733 Obstructive sleep apnea (adult) (pediatric): Secondary | ICD-10-CM | POA: Diagnosis not present

## 2018-12-17 DIAGNOSIS — G4733 Obstructive sleep apnea (adult) (pediatric): Secondary | ICD-10-CM | POA: Diagnosis not present

## 2019-01-17 DIAGNOSIS — G4733 Obstructive sleep apnea (adult) (pediatric): Secondary | ICD-10-CM | POA: Diagnosis not present

## 2019-01-20 DIAGNOSIS — H6123 Impacted cerumen, bilateral: Secondary | ICD-10-CM | POA: Diagnosis not present

## 2019-02-16 DIAGNOSIS — G4733 Obstructive sleep apnea (adult) (pediatric): Secondary | ICD-10-CM | POA: Diagnosis not present

## 2019-03-06 DIAGNOSIS — R7301 Impaired fasting glucose: Secondary | ICD-10-CM | POA: Diagnosis not present

## 2019-03-06 DIAGNOSIS — E039 Hypothyroidism, unspecified: Secondary | ICD-10-CM | POA: Diagnosis not present

## 2019-03-06 DIAGNOSIS — E782 Mixed hyperlipidemia: Secondary | ICD-10-CM | POA: Diagnosis not present

## 2019-03-06 DIAGNOSIS — I10 Essential (primary) hypertension: Secondary | ICD-10-CM | POA: Diagnosis not present

## 2019-03-19 DIAGNOSIS — G4700 Insomnia, unspecified: Secondary | ICD-10-CM | POA: Diagnosis not present

## 2019-03-19 DIAGNOSIS — R7301 Impaired fasting glucose: Secondary | ICD-10-CM | POA: Diagnosis not present

## 2019-03-19 DIAGNOSIS — E039 Hypothyroidism, unspecified: Secondary | ICD-10-CM | POA: Diagnosis not present

## 2019-03-19 DIAGNOSIS — G4733 Obstructive sleep apnea (adult) (pediatric): Secondary | ICD-10-CM | POA: Diagnosis not present

## 2019-03-19 DIAGNOSIS — G2581 Restless legs syndrome: Secondary | ICD-10-CM | POA: Diagnosis not present

## 2019-03-19 DIAGNOSIS — E782 Mixed hyperlipidemia: Secondary | ICD-10-CM | POA: Diagnosis not present

## 2019-03-19 DIAGNOSIS — N182 Chronic kidney disease, stage 2 (mild): Secondary | ICD-10-CM | POA: Diagnosis not present

## 2019-03-19 DIAGNOSIS — I129 Hypertensive chronic kidney disease with stage 1 through stage 4 chronic kidney disease, or unspecified chronic kidney disease: Secondary | ICD-10-CM | POA: Diagnosis not present

## 2019-03-19 DIAGNOSIS — I2510 Atherosclerotic heart disease of native coronary artery without angina pectoris: Secondary | ICD-10-CM | POA: Diagnosis not present

## 2019-03-31 DIAGNOSIS — M25561 Pain in right knee: Secondary | ICD-10-CM | POA: Diagnosis not present

## 2019-03-31 DIAGNOSIS — M1711 Unilateral primary osteoarthritis, right knee: Secondary | ICD-10-CM | POA: Diagnosis not present

## 2019-04-18 ENCOUNTER — Other Ambulatory Visit: Payer: Self-pay

## 2019-04-18 ENCOUNTER — Ambulatory Visit (INDEPENDENT_AMBULATORY_CARE_PROVIDER_SITE_OTHER): Payer: Medicare HMO

## 2019-04-18 ENCOUNTER — Encounter: Payer: Self-pay | Admitting: Orthopedic Surgery

## 2019-04-18 ENCOUNTER — Ambulatory Visit (INDEPENDENT_AMBULATORY_CARE_PROVIDER_SITE_OTHER): Payer: Medicare HMO | Admitting: Orthopedic Surgery

## 2019-04-18 VITALS — BP 153/102 | HR 74 | Temp 98.1°F | Ht 73.0 in | Wt 212.4 lb

## 2019-04-18 DIAGNOSIS — I509 Heart failure, unspecified: Secondary | ICD-10-CM | POA: Diagnosis not present

## 2019-04-18 DIAGNOSIS — M25562 Pain in left knee: Secondary | ICD-10-CM

## 2019-04-18 DIAGNOSIS — G8929 Other chronic pain: Secondary | ICD-10-CM

## 2019-04-18 NOTE — Progress Notes (Signed)
[name] [name]  04/18/2019  HISTORY SECTION :  Chief Complaint  Patient presents with  . Knee Pain    L/hurts and its swollen some for a month or more   78 year old male presents with a 4-week history of pain on the medial side of his left knee is started atraumatically and acutely.  The pain is moderate to severe he had 2 weeks of anti-inflammatories then got a cortisone shot and is now taken meloxicam for a total of 4 weeks with no improvement in his symptoms   Review of Systems  All other systems reviewed and are negative.    Past Medical History:  Diagnosis Date  . CAD (coronary artery disease)   . CAD (coronary artery disease) 12/04/2011   Left Main Disease with 3- vessel CAD   . CAD (coronary artery disease)    seeing Dr Harl Bowie  . CHF (congestive heart failure) ([place])   . Deviated septum   . Dyslipidemia   . GERD (gastroesophageal reflux disease)   . Heart attack ([place])   . Hyperlipidemia   . Hypertension   . Hypothyroidism   . MI (myocardial infarction) ([place]) 1997  . Obesity (BMI 30-39.9) 09/02/2018  . PONV (postoperative nausea and vomiting)    has in past, no problems after most recent surgery  . 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 S/P carotid endarterectomy 02/29/2012   Right CEA for asymptomatic severe right ICA stenosis   . 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 Shortness of breath    with exertion  . Sleep apnea    CPAP, sleep study at [facility]  . 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   80-99% stenosis RICA, asymptomatic  . Thyroid nodule   . Wears glasses    or contacts    Past Surgical History:  Procedure Laterality Date  . ADENOIDECTOMY    . CARDIAC CATHETERIZATION     02/22/12  . CARDIOVASCULAR STRESS TEST  7/13  . CAROTID ENDARTERECTOMY    . COLONOSCOPY  15-20years and 12/04/11   Done at [place]  12/11/2011   Procedure: COLONOSCOPY;   Surgeon: Rogene Houston, MD;  Location[facility];  Service: Endoscopy;  Laterality: N/A;  830  . CORONARY ARTERY BYPASS GRAFT  02/29/2012   Procedure: CORONARY ARTERY BYPASS GRAFTING (CABG);  Surgeon: Rexene Alberts, MD;  Location: [place];  Service: Open Heart Surgery;  Laterality: N/A;  times three using Left Internal Mammary Artery and Right Greater Saphenous Vein Graft Harvested Endoscopically  . CORONARY STENT PLACEMENT    . ENDARTERECTOMY  02/29/2012   Procedure: ENDARTERECTOMY CAROTID;  Surgeon: Angelia Mould, MD;  Location: [place];  Service: Vascular;  Laterality: Right;  . FOOT SURGERY  06/30/11   artificial joint in big toe right foot   . LEFT HEART CATHETERIZATION WITH CORONARY ANGIOGRAM N/A 02/20/2012   Procedure: LEFT HEART CATHETERIZATION WITH CORONARY ANGIOGRAM;  Surgeon: Troy Sine, MD;  Location[facility] CATH LAB;  Service: Cardiovascular;  Laterality: N/A;  . NASAL SEPTUM SURGERY    . [place] thyroidectomy  02/06/2011  . TRANSESOPHAGEAL ECHOCARDIOGRAM    . UPPER GASTROINTESTINAL ENDOSCOPY  35-40 years ago   [facility]     No Known Allergies   Current Outpatient Medications:  .  aspirin EC 81 MG tablet, Take 81 mg by mouth daily., Disp: ,  Rfl:  .  atorvastatin (LIPITOR) 40 MG tablet, Take 1 tablet (40 mg total) by mouth daily., Disp: 90 tablet, Rfl: 3 .  Esomeprazole Magnesium (NEXIUM PO), OTC, Disp: , Rfl:  .  metoprolol (LOPRESSOR) 50 MG tablet, Take 1 tablet (50 mg total) by mouth 2 (two) times daily., Disp: 180 tablet, Rfl: 1 .  Multiple Vitamins-Minerals (PRESERVISION AREDS 2 PO), Take by mouth., Disp: , Rfl:  .  Omega-3 Fatty Acids (FISH OIL) 1000 MG CPDR, Take 1,000 mg by mouth 2 (two) times daily. , Disp: , Rfl:  .  omeprazole (PRILOSEC) 20 MG capsule, Take 20 mg by mouth daily., Disp: , Rfl:  .  Psyllium (METAMUCIL PO), Take 3 capsules by mouth daily. , Disp: , Rfl:  .  ramipril (ALTACE) 10 MG capsule, Take 1 capsule by mouth daily., Disp: , Rfl:  .   SYNTHROID 112 MCG tablet, Take 1 tablet by mouth daily., Disp: , Rfl:  .  zolpidem (AMBIEN) 10 MG tablet, Take 10 mg by mouth at bedtime as needed for sleep., Disp: , Rfl:    PHYSICAL EXAM SECTION: 1) BP (!) 153/102   Pulse 74   Temp 98.1 F (36.7 C)   Ht 6\' 1"  (1.854 m)   Wt 212 lb 6 oz (96.3 kg)   BMI 28.02 kg/m   Body mass index is 28.02 kg/m. General appearance: Well-developed well-nourished no gross deformities  2) Cardiovascular normal pulse and perfusion in the lower  extremities normal color without edema  3) Neurologically deep tendon reflexes are equal and normal, no sensation loss or deficits no pathologic reflexes  4) Psychological: Awake alert and oriented x3 mood and affect normal  5) Skin no lacerations or ulcerations no nodularity no palpable masses, no erythema or nodularity  6) Musculoskeletal:   Left knee has a large effusion he can flex it about 125 degrees he has some pain with terminal flexion cannot extend about 5 degrees he has medial joint line pain positive screw home test for meniscal tear possible areas of meniscal tear most all normal, all ligaments stable  Right knee no tenderness or effusion no instability muscle tone is normal  MEDICAL DECISION SECTION:  Encounter Diagnosis  Name Primary?  . Chronic pain of left knee Yes    Imaging X-ray shows mild arthritis medial side left knee more severe arthritis right knee  Plan:  (Rx., Inj., surg., Frx, MRI/CT, XR:2)  Aspiration injection left knee  Continue meloxicam follow-up in 10 days reassess knee   Procedure note injection and aspiration left knee joint  Verbal consent was obtained to aspirate and inject the left knee joint   Timeout was completed to confirm the site of aspiration and injection  An 18-gauge needle was used to aspirate the left knee joint from a suprapatellar lateral approach.  The medications used were 40 mg of Depo-Medrol and 1% lidocaine 3 cc  Anesthesia was  provided by ethyl chloride and the skin was prepped with alcohol.  After cleaning the skin with alcohol an 18-gauge needle was used to aspirate the right knee joint.  We obtained 37 cc of fluid clear yellow   We followed this by injection of 40 mg of Depo-Medrol and 3 cc 1% lidocaine.  There were no complications. A sterile bandage was applied.     10:47 AM Arther Abbott, MD  04/18/2019

## 2019-04-19 ENCOUNTER — Encounter

## 2019-04-19 DIAGNOSIS — G4733 Obstructive sleep apnea (adult) (pediatric): Secondary | ICD-10-CM | POA: Diagnosis not present

## 2019-05-02 ENCOUNTER — Encounter: Payer: Self-pay | Admitting: Orthopedic Surgery

## 2019-05-02 ENCOUNTER — Ambulatory Visit (INDEPENDENT_AMBULATORY_CARE_PROVIDER_SITE_OTHER): Payer: Medicare HMO | Admitting: Orthopedic Surgery

## 2019-05-02 ENCOUNTER — Other Ambulatory Visit: Payer: Self-pay

## 2019-05-02 VITALS — BP 150/77 | HR 66 | Ht 73.0 in | Wt 212.0 lb

## 2019-05-02 DIAGNOSIS — M25562 Pain in left knee: Secondary | ICD-10-CM

## 2019-05-02 DIAGNOSIS — G8929 Other chronic pain: Secondary | ICD-10-CM

## 2019-05-02 MED ORDER — DICLOFENAC SODIUM 75 MG PO TBEC: 75.0000 mg | DELAYED_RELEASE_TABLET | Freq: Two times a day (BID) | ORAL | 2 refills | Status: DC

## 2019-05-02 NOTE — Progress Notes (Signed)
78 year old male had his knee aspirated he says he is doing better  Chief Complaint  Patient presents with  . Knee Pain    left /improving     Patient has pain in his left knee which has improved after aspiration.  He was taking Voltaren says that works better for his pain he has occasional giving way episodes and some tightness.  He has been using ice started off with meloxicam for anti-inflammatory  Reexamination reveals he has some tenderness over the medial joint line he has may be slight effusion McMurray sign was negative Apley test was normal hyperextension test was positive  Review of systems is negative for any other musculoskeletal problems  Encounter Diagnosis  Name Primary?  . Chronic pain of left knee Yes    Possible meniscal tear versus OA  Meds ordered this encounter  Medications  . diclofenac (VOLTAREN) 75 MG EC tablet    Sig: Take 1 tablet (75 mg total) by mouth 2 (two) times daily.    Dispense:  60 tablet    Refill:  2    Follow-up 4 weeks if still not resolved recommend MRI

## 2019-05-19 DIAGNOSIS — G4733 Obstructive sleep apnea (adult) (pediatric): Secondary | ICD-10-CM | POA: Diagnosis not present

## 2019-06-02 ENCOUNTER — Ambulatory Visit: Payer: Medicare HMO | Admitting: Orthopedic Surgery

## 2019-06-03 DIAGNOSIS — I10 Essential (primary) hypertension: Secondary | ICD-10-CM | POA: Diagnosis not present

## 2019-06-03 DIAGNOSIS — E782 Mixed hyperlipidemia: Secondary | ICD-10-CM | POA: Diagnosis not present

## 2019-06-03 DIAGNOSIS — E039 Hypothyroidism, unspecified: Secondary | ICD-10-CM | POA: Diagnosis not present

## 2019-06-03 DIAGNOSIS — R7301 Impaired fasting glucose: Secondary | ICD-10-CM | POA: Diagnosis not present

## 2019-06-03 DIAGNOSIS — R7303 Prediabetes: Secondary | ICD-10-CM | POA: Diagnosis not present

## 2019-06-03 DIAGNOSIS — E875 Hyperkalemia: Secondary | ICD-10-CM | POA: Diagnosis not present

## 2019-06-03 DIAGNOSIS — F5101 Primary insomnia: Secondary | ICD-10-CM | POA: Diagnosis not present

## 2019-06-03 DIAGNOSIS — N182 Chronic kidney disease, stage 2 (mild): Secondary | ICD-10-CM | POA: Diagnosis not present

## 2019-06-04 ENCOUNTER — Ambulatory Visit (INDEPENDENT_AMBULATORY_CARE_PROVIDER_SITE_OTHER): Payer: Medicare HMO | Admitting: Orthopedic Surgery

## 2019-06-04 ENCOUNTER — Other Ambulatory Visit: Payer: Self-pay

## 2019-06-04 ENCOUNTER — Encounter: Payer: Self-pay | Admitting: Orthopedic Surgery

## 2019-06-04 VITALS — BP 164/90 | HR 97 | Ht 73.0 in | Wt 209.0 lb

## 2019-06-04 DIAGNOSIS — S83207D Unspecified tear of unspecified meniscus, current injury, left knee, subsequent encounter: Secondary | ICD-10-CM

## 2019-06-04 NOTE — Progress Notes (Signed)
Chief Complaint  Patient presents with  . Knee Pain    left    78 year old male we have been following for left knee pain.  He had aspiration injection he was on meloxicam and took that for a while and comes back in to see if he still having left knee pain and intermittent giving way episodes with the plan for MRI if still symptomatic  Chief Complaint  Patient presents with  . Knee Pain      L/hurts and its swollen some for a month or more   78 year old male presents with a 4-week history of pain on the medial side of his left knee is started atraumatically and acutely.  The pain is moderate to severe he had 2 weeks of anti-inflammatories then got a cortisone shot and is now taken meloxicam for a total of 4 weeks with no improvement in his symptoms    has a past medical history of CAD (coronary artery disease), CAD (coronary artery disease) (12/04/2011), CAD (coronary artery disease), CHF (congestive heart failure) ([place]), Deviated septum, Dyslipidemia, GERD (gastroesophageal reflux disease), Heart attack ([place]), Hyperlipidemia, Hypertension, Hypothyroidism, MI (myocardial infarction) ([place]) (1997), Obesity (BMI 30-39.9) (09/02/2018), PONV (postoperative nausea and vomiting), 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   BP (!) 164/90   Pulse 97   Ht 6\' 1"  (1.854 m)   Wt 209 lb (94.8 kg)   BMI 27.57 kg/m   Joint effusion left knee tenderness medial joint line he can fully extend but he has a little tightness with flexion knee feels stable muscle tone is normal neurovascular exam is intact skin is normal  We discussed surgery versus MRI then surgery  He does have a heart condition but has been stable side recommend MRI just to confirm before to go to surgery he is agreeable  Torn medial meniscus  Call patient  after scan and discuss surgery which he wants after the second week of November

## 2019-06-04 NOTE — Patient Instructions (Addendum)
We will obtain pre-certification from the insurer and call you to schedule the study. Dr Aline Brochure will call you with the results    Knee Arthroscopy Knee arthroscopy is a surgery to examine the inside of the knee joint and repair any damage to cartilage, surfaces, and other soft tissues around the joint. You may have this surgery if non-surgical treatment has not relieved your symptoms. Knee arthroscopy may be used to:  Repair a torn ligament or other torn tissues. Ligaments are tissues that connect bones to each other.  Remove bone fragments.  Remove a fluid-filled sac (cyst).  Treat kneecap (patella)problems.  Treat an advanced infection in the knee (septicknee). Arthroscopic surgery is done using a thin tube that has a light and camera on the end of it (arthroscope). The arthroscope is placed through a small incision, and the camera sends images to a screen in the operating room. The images are used to help perform the surgery. Tell a health care provider about:  Any allergies you have.  All medicines you are taking, including vitamins, herbs, eye drops, creams, and over-the-counter medicines.  Any problems you or family members have had with anesthetic medicines.  Any blood disorders you have.  Any surgeries you have had.  Any medical conditions you have.  Whether you are pregnant or may be pregnant. What are the risks? Generally, this is a safe procedure. However, problems may occur, including:  Infection.  Bleeding.  Allergic reactions to medicines.  Damage to blood vessels, nerves, or tissues in the knee.  A blood clot that forms in the leg and travels to the lung (pulmonary embolism).  Failure of the surgery to relieve symptoms.  Knee stiffness. What happens before the procedure? Staying hydrated Follow instructions from your health care provider about hydration, which may include:  Up to 2 hours before the procedure - you may continue to drink clear  liquids, such as water, clear fruit juice, black coffee, and plain tea. Eating and drinking restrictions Follow instructions from your health care provider about eating and drinking, which may include:  8 hours before the procedure - stop eating heavy meals or foods such as meat, fried foods, or fatty foods.  6 hours before the procedure - stop eating light meals or foods, such as toast or cereal.  6 hours before the procedure - stop drinking milk or drinks that contain milk.  2 hours before the procedure - stop drinking clear liquids. Medicines Ask your health care provider about:  Changing or stopping your regular medicines. This is especially important if you are taking diabetes medicines or blood thinners.  Taking medicines such as aspirin and ibuprofen. These medicines can thin your blood. Do not take these medicines unless your health care provider tells you to take them.  Taking over-the-counter medicines, vitamins, herbs, and supplements. Testing  Your knee may be examined. Your health care provider may move your knee or ask you to move it in specific ways to see how much motion you have.  You may have blood tests.  You may have imaging tests, such as an X-ray, MRI, or CT scan.  You may have an electrocardiogram. This test records the electrical activity in the heart. General instructions  Do not drink alcohol unless your health care provider says that you can.  Do not use any products that contain nicotine or tobacco, such as cigarettes and e-cigarettes, for one month or more before your surgery. If you need help quitting, ask your health care provider.  Plan to have someone take you home from the hospital or clinic.  Plan to have a responsible adult care for you for at least 24 hours after you leave the hospital or clinic. This is important. What happens during the procedure?   To lower your risk of infection: ? Your health care team will wash or sanitize their  hands. ? Hair may be removed from the surgical area. ? Your skin will be washed with soap.  An IV will be inserted into one of your veins.  You will be given one or more of the following: ? A medicine to help you relax (sedative). ? A medicine to numb the knee area (local anesthetic). ? A medicine to make you fall asleep (general anesthetic). ? A medicine that is injected into an area of your body to numb everything below the injection site (regional anesthetic). This may be injected into your groin or thigh.  A cuff may be placed around your upper leg to slow blood flow to your lower leg during the procedure.  Several small incisions will be made around your knee.  Your knee joint will be rinsed (flushed) and filled with a germ-free solution (sterile saline). This expands the area to allow your surgeon to see the joint more clearly.  An arthroscope will be passed through one of your incisions, into your knee joint.  Other surgical instruments will be passed through the other incisions. Then, your surgeon will examine and repair your knee as needed.  The sterile saline will be drained from your knee, and the cuff will be removed from your upper leg.  Your incisions will be closed with adhesive strips or stitches (sutures) and covered with a bandage (dressing). The procedure may vary among health care providers and hospitals. What happens after the procedure?  Your blood pressure, heart rate, breathing rate, and blood oxygen level will be monitored until the medicines you were given have worn off.  You will be given pain medicine as needed.  You may be given medicine to lower your risk of blood clots.  You may have to wear compression stockings. These stockings help to prevent blood clots and reduce swelling in your legs.  Your health care provider will give you instructions about how much body weight you can safely support on your leg (weight-bearing restrictions). You may be  given crutches or other devices to help you move around (assistive devices).  You may be shown how to do physical therapy exercises to help you recover.  Do not drive until your health care provider approves. Summary  Knee arthroscopy is a surgery to examine or repair the inside of the knee joint.  Before the procedure, follow instructions from your health care provider about eating and drinking.  Plan to have someone take you home from the hospital or clinic. This information is not intended to replace advice given to you by your health care provider. Make sure you discuss any questions you have with your health care provider. Document Released: 07/28/2000 Document Revised: 07/13/2017 Document Reviewed: 05/03/2017 Elsevier Patient Education  2020 [employer].

## 2019-06-17 ENCOUNTER — Ambulatory Visit (HOSPITAL_COMMUNITY): Payer: Medicare HMO

## 2019-06-19 ENCOUNTER — Other Ambulatory Visit: Payer: Self-pay

## 2019-06-19 ENCOUNTER — Ambulatory Visit (HOSPITAL_COMMUNITY)
Admission: RE | Admit: 2019-06-19 | Discharge: 2019-06-19 | Disposition: A | Discharge status: Home / Self Care | Payer: Medicare HMO | Source: Ambulatory Visit | Attending: Orthopedic Surgery | Admitting: Orthopedic Surgery

## 2019-06-19 DIAGNOSIS — S83207D Unspecified tear of unspecified meniscus, current injury, left knee, subsequent encounter: Secondary | ICD-10-CM | POA: Insufficient documentation

## 2019-06-19 DIAGNOSIS — G4733 Obstructive sleep apnea (adult) (pediatric): Secondary | ICD-10-CM | POA: Diagnosis not present

## 2019-06-19 DIAGNOSIS — M25562 Pain in left knee: Secondary | ICD-10-CM | POA: Diagnosis not present

## 2019-06-19 IMAGING — MR MR KNEE*L* W/O CM
4 of 6 series · 14 of 40 positions shown · non-contrast
Comparison: None.

CLINICAL DATA: Left knee pain for 6 months.  No known injury.

EXAM:
MRI OF THE LEFT KNEE WITHOUT CONTRAST
TECHNIQUE: Multiplanar, multisequence MR imaging of the knee was performed. No
intravenous contrast was administered.

[Series 3: T2 fat-sat · axial · 4.0mm · 0.27mm/px · z∈[-101,+24]mm · 3 of 26 slices shown (1 of 2)]
[im 1/26]
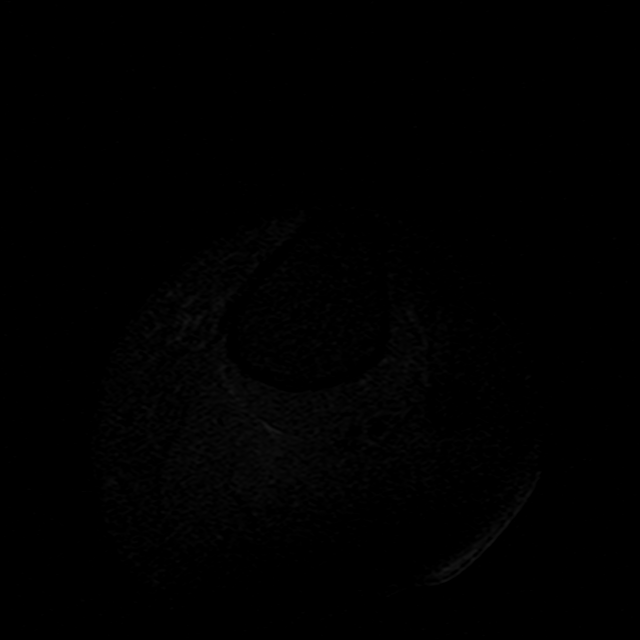
[im 13/26]
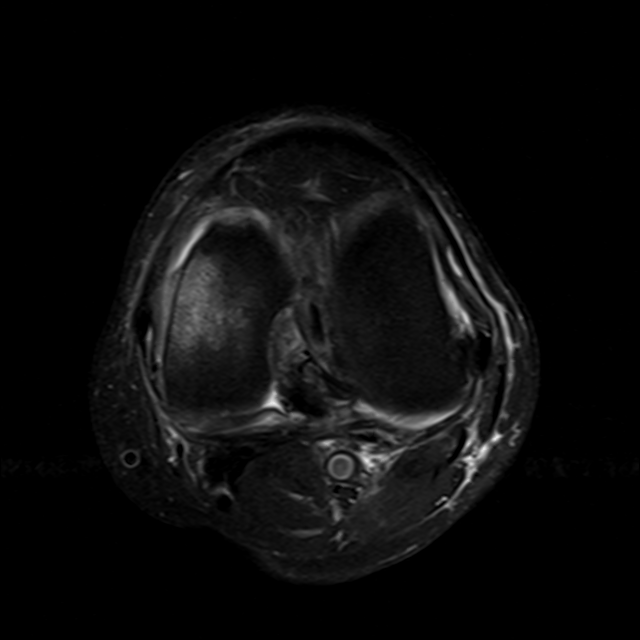
[im 26/26]
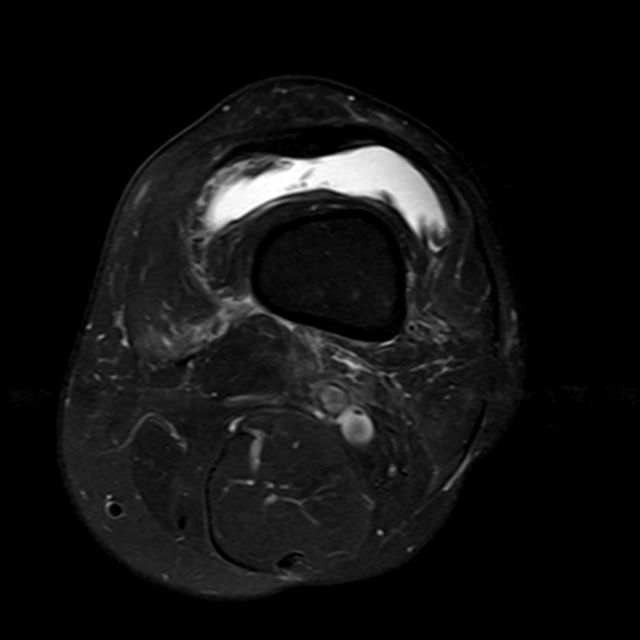

[Series 5: T2 fat-sat · coronal · 4.0mm · 0.29mm/px · 3 of 30 slices shown (2 of 2)]
[im 5/30]
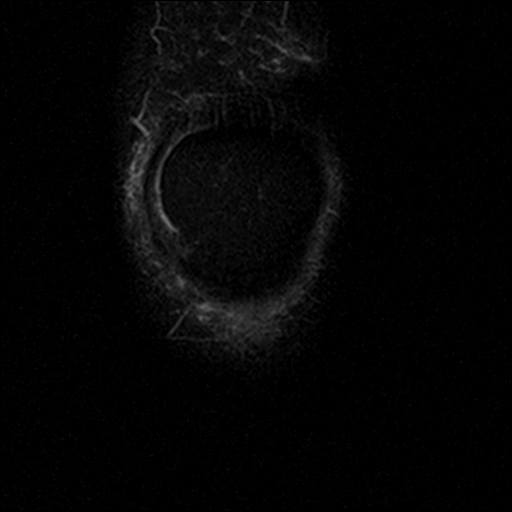
[im 15/30]
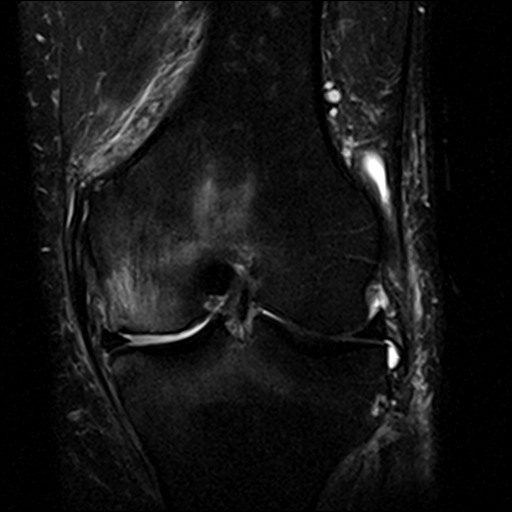
[im 25/30]
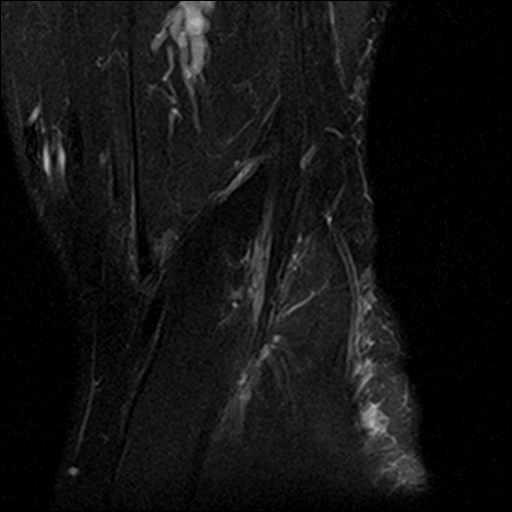

[Series 6: PD fat-sat · coronal · 3.0mm · 0.25mm/px · 5 of 33 slices shown (1 of 2)]
[im 1/33]
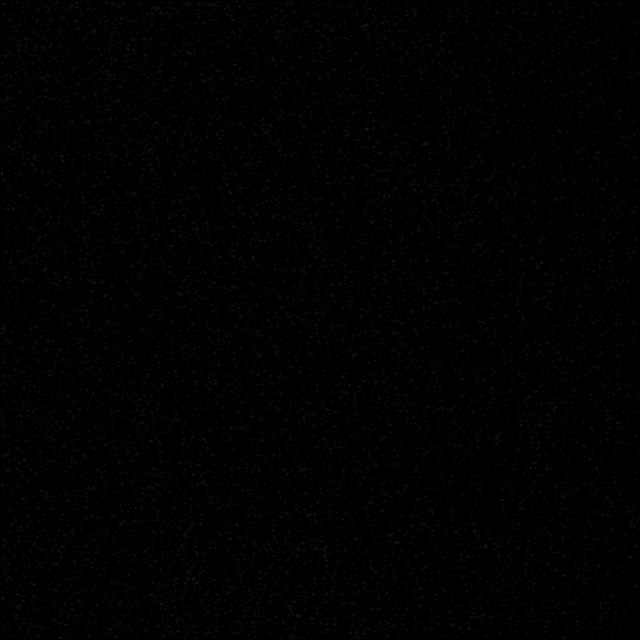
[im 6/33]
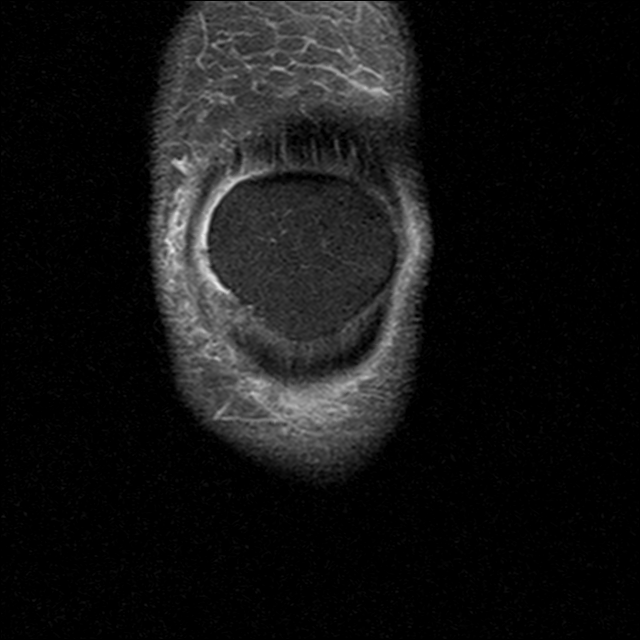
[im 11/33]
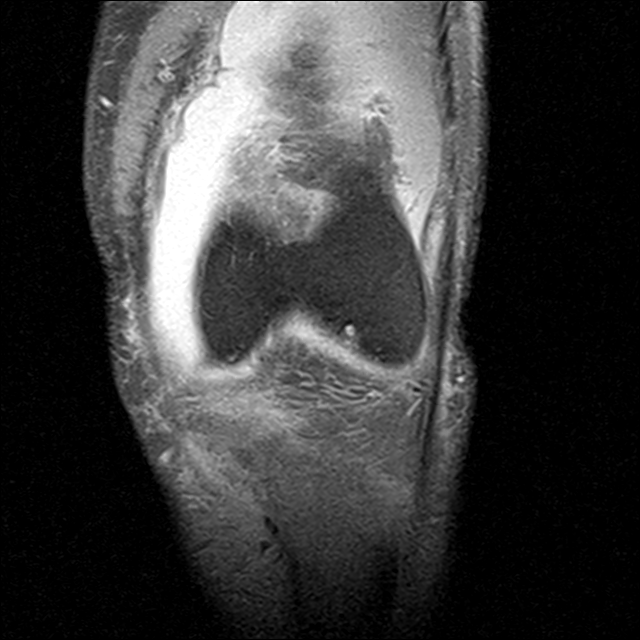
[im 17/33]
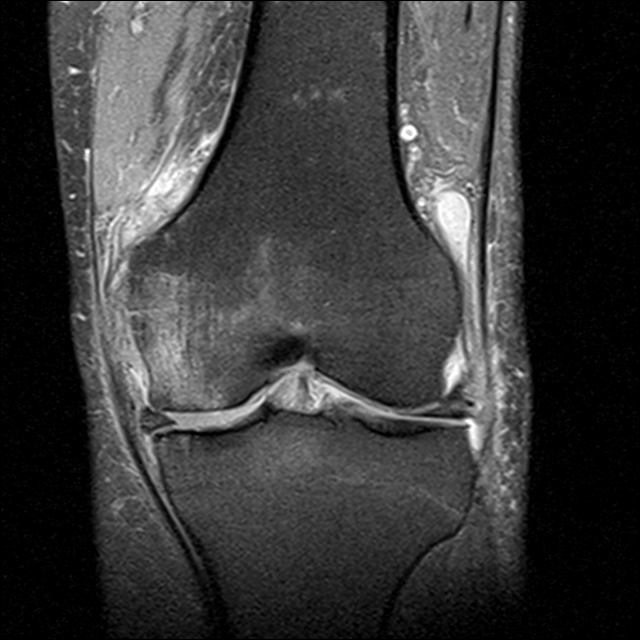
[im 27/33]
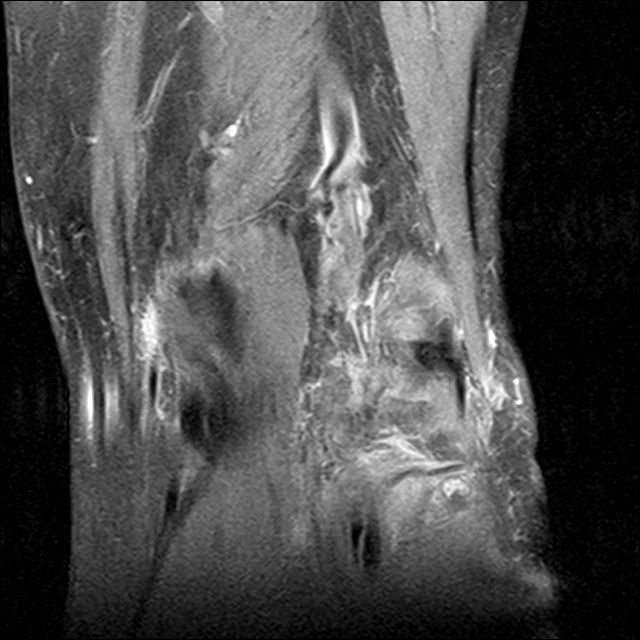

[Series 7: PD fat-sat · sagittal · 3.0mm · 0.23mm/px · 3 of 29 slices shown (2 of 2)]
[im 5/29]
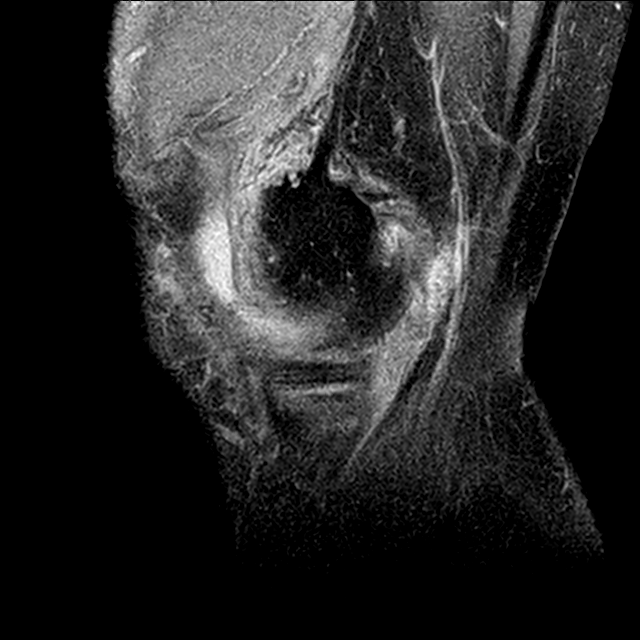
[im 15/29]
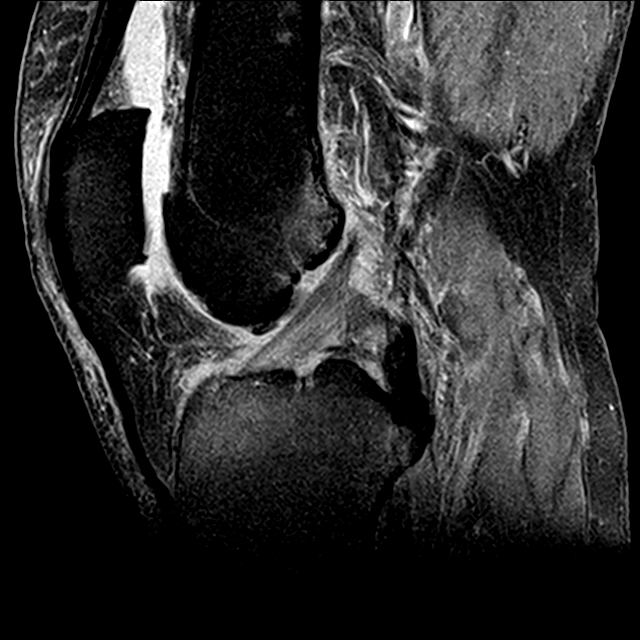
[im 24/29]
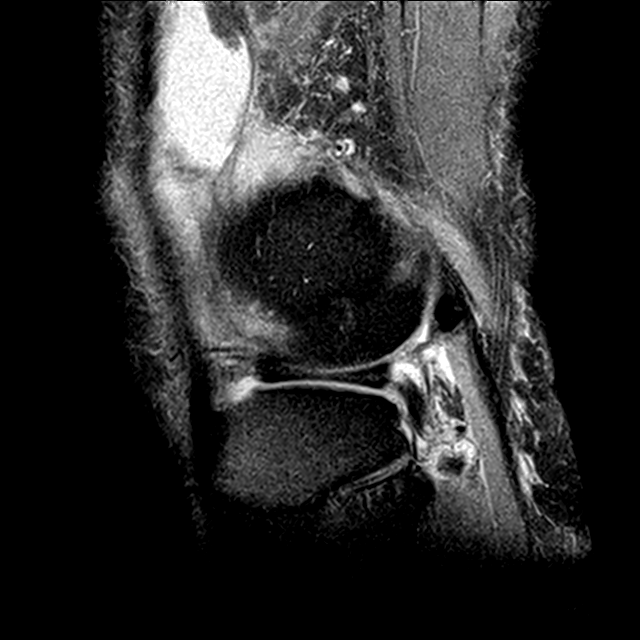

[14 of 40 positions shown; findings below may reference images not displayed]

FINDINGS: MENISCI

Medial meniscus: Horizontal tear of the posterior horn of the medial
meniscus extending to the superior articular surface.

Lateral meniscus: Small oblique tear of the posterior horn of the
lateral meniscus extending to the superior articular surface.

LIGAMENTS

Cruciates:  Intact ACL and PCL.

Collaterals: Mild thickening of the proximal MCL with edema
superficial to the MCL consistent with mild MCL strain. Lateral
collateral ligament complex is intact.

CARTILAGE

Patellofemoral: Full-thickness cartilage loss of the medial
patellofemoral compartment. Partial-thickness cartilage loss of
lateral patellar facet.

Medial: Extensive full-thickness cartilage loss of the medial
femoral condyle with severe subchondral marrow edema. High-grade
partial-thickness cartilage loss with areas of full-thickness
cartilage loss of the medial tibial plateau with subchondral marrow
edema.

Lateral: Partial-thickness cartilage loss of the lateral femoral
condyle. Cartilage irregularity of the lateral tibial plateau.

Joint: Large joint effusion. Normal Hoffa's fat. No plical
thickening.

Popliteal Fossa:  No Baker cyst. Intact popliteus tendon.

Extensor Mechanism: Intact quadriceps tendon. Intact patellar
tendon. Intact medial patellar retinaculum. Intact lateral patellar
retinaculum. Intact MPFL.

Bones:  No acute osseous abnormality.  No aggressive osseous lesion.

Other: No fluid collection or hematoma.
IMPRESSION: 1. Horizontal tear of the posterior horn of the medial meniscus
extending to the superior articular surface.
2. Small oblique tear of the posterior horn of the lateral meniscus
extending to the superior articular surface.
3. Tricompartmental cartilage abnormalities as described above.
4. Large joint effusion.

## 2019-06-24 ENCOUNTER — Telehealth: Payer: Self-pay | Admitting: Orthopedic Surgery

## 2019-06-24 ENCOUNTER — Other Ambulatory Visit: Payer: Self-pay | Admitting: Cardiology

## 2019-06-24 NOTE — Telephone Encounter (Signed)
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

## 2019-06-25 ENCOUNTER — Telehealth: Payer: Self-pay | Admitting: Orthopedic Surgery

## 2019-06-25 NOTE — Telephone Encounter (Signed)
[name] [name] states that you called him yesterday while he was driving.    He wants you to call him again today to go over his MRI with him.  He also has some questions regarding getting clearance from his heart doctor.  He says he will be home all day  Thanks

## 2019-07-02 ENCOUNTER — Encounter: Payer: Self-pay | Admitting: Cardiology

## 2019-07-02 ENCOUNTER — Other Ambulatory Visit: Payer: Self-pay

## 2019-07-02 ENCOUNTER — Ambulatory Visit (INDEPENDENT_AMBULATORY_CARE_PROVIDER_SITE_OTHER): Payer: Medicare HMO | Admitting: Cardiology

## 2019-07-02 ENCOUNTER — Telehealth: Payer: Self-pay

## 2019-07-02 VITALS — BP 131/76 | HR 77 | Temp 97.3°F | Ht 73.0 in | Wt 212.0 lb

## 2019-07-02 DIAGNOSIS — I10 Essential (primary) hypertension: Secondary | ICD-10-CM | POA: Diagnosis not present

## 2019-07-02 DIAGNOSIS — I6523 Occlusion and stenosis of bilateral carotid arteries: Secondary | ICD-10-CM | POA: Diagnosis not present

## 2019-07-02 DIAGNOSIS — E782 Mixed hyperlipidemia: Secondary | ICD-10-CM

## 2019-07-02 DIAGNOSIS — I2510 Atherosclerotic heart disease of native coronary artery without angina pectoris: Secondary | ICD-10-CM | POA: Diagnosis not present

## 2019-07-02 NOTE — Telephone Encounter (Signed)
Looks like they got him in today, the doctor states Recommend proceeding with knee surgery as planned

## 2019-07-02 NOTE — Progress Notes (Signed)
Clinical Summary Mr. Jacobs is a 78 y.o.male seen today for follow up of the following medical problems.   1. CAD  - prior 3 vessel CABG July 2013, LVEF around that time by LV gram was 55%  - has had chronic MSK chest pain since his CABG that is unchanged    - no recent chest pain. Chronic MSK pain unchanged at CABG site - no SOB or DOE - compliant with meds  2. Carotid stenosis  - prior right CEA July 2013  - followed by vascular surgery   - no recent neuro symptoms.  - followed by vascular  3. HTN  - compliant with meds  4. Hyperlipidemia  - compliant with statin, labs followed by pcp   5. OSA  - compliant with cpap, followed by Dr Radford Pax   6. Preoperative evaluation - requires knee surgery  - does heavy yardworkd regularly without issues. Walks up flight of stairs regularly without issues.    Past Medical History:  Diagnosis Date  . CAD (coronary artery disease)   . CAD (coronary artery disease) 12/04/2011   Left Main Disease with 3- vessel CAD   . CAD (coronary artery disease)    seeing Dr Harl Bowie  . CHF (congestive heart failure) ([place])   . Deviated septum   . Dyslipidemia   . GERD (gastroesophageal reflux disease)   . Heart attack ([place])   . Hyperlipidemia   . Hypertension   . Hypothyroidism   . MI (myocardial infarction) ([place]) 1997  . Obesity (BMI 30-39.9) 09/02/2018  . PONV (postoperative nausea and vomiting)    has in past, no problems after most recent surgery  . 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 S/P carotid endarterectomy 02/29/2012   Right CEA for asymptomatic severe right ICA stenosis   . 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 Shortness of breath    with exertion  . Sleep apnea    CPAP, sleep [facility] at [facility]  . 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   80-99% stenosis RICA, asymptomatic  . Thyroid nodule   . Wears glasses    or  contacts     No Known Allergies   Current Outpatient Medications  Medication Sig Dispense Refill  . aspirin EC 81 MG tablet Take 81 mg by mouth daily.    Marland Kitchen atorvastatin (LIPITOR) 40 MG tablet TAKE 1 TABLET EVERY DAY 30 tablet 0  . diclofenac (VOLTAREN) 75 MG EC tablet Take 1 tablet (75 mg total) by mouth 2 (two) times daily. 60 tablet 2  . Esomeprazole Magnesium (NEXIUM PO) OTC    . metoprolol (LOPRESSOR) 50 MG tablet Take 1 tablet (50 mg total) by mouth 2 (two) times daily. 180 tablet 1  . Multiple Vitamins-Minerals (PRESERVISION AREDS 2 PO) Take by mouth.    . Omega-3 Fatty Acids (FISH OIL) 1000 MG CPDR Take 1,000 mg by mouth 2 (two) times daily.     Marland Kitchen omeprazole (PRILOSEC) 20 MG capsule Take 20 mg by mouth daily.    . Psyllium (METAMUCIL PO) Take 3 capsules by mouth daily.     . ramipril (ALTACE) 10 MG capsule Take 1 capsule by mouth daily.    Marland Kitchen SYNTHROID 112 MCG tablet Take 1 tablet by mouth daily.    Marland Kitchen zolpidem (AMBIEN) 10 MG tablet Take 10 mg by mouth at bedtime as needed for  sleep.     No current facility-administered medications for this visit.      Past Surgical History:  Procedure Laterality Date  . ADENOIDECTOMY    . CARDIAC CATHETERIZATION     02/22/12  . CARDIOVASCULAR STRESS TEST  7/13  . CAROTID ENDARTERECTOMY    . COLONOSCOPY  15-20years and 12/04/11   Done at [place]  12/11/2011   Procedure: COLONOSCOPY;  Surgeon: Rogene Houston, MD;  Location[facility];  Service: Endoscopy;  Laterality: N/A;  830  . CORONARY ARTERY BYPASS GRAFT  02/29/2012   Procedure: CORONARY ARTERY BYPASS GRAFTING (CABG);  Surgeon: Rexene Alberts, MD;  Location: [place];  Service: Open Heart Surgery;  Laterality: N/A;  times three using Left Internal Mammary Artery and Right Greater Saphenous Vein Graft Harvested Endoscopically  . CORONARY STENT PLACEMENT    . ENDARTERECTOMY  02/29/2012   Procedure: ENDARTERECTOMY CAROTID;  Surgeon: Angelia Mould, MD;   Location: [place];  Service: Vascular;  Laterality: Right;  . FOOT SURGERY  06/30/11   artificial joint in big toe right foot   . LEFT HEART CATHETERIZATION WITH CORONARY ANGIOGRAM N/A 02/20/2012   Procedure: LEFT HEART CATHETERIZATION WITH CORONARY ANGIOGRAM;  Surgeon: Troy Sine, MD;  Location: [facility] CATH LAB;  Service: Cardiovascular;  Laterality: N/A;  . NASAL SEPTUM SURGERY    . [place] thyroidectomy  02/06/2011  . TRANSESOPHAGEAL ECHOCARDIOGRAM    . UPPER GASTROINTESTINAL ENDOSCOPY  35-40 years ago   [facility]     No Known Allergies    Family History  Problem Relation Age of Onset  . Hypertension Mother   . Healthy Sister   . Healthy Brother   . Healthy Son   . Heart attack Father   . Heart disease Father        Before age 102  . Hyperlipidemia Father   . Hypertension Father   . Prostate cancer Father   . Colon cancer Neg Hx      Social History Mr. Hailes reports that he quit smoking about 17 years ago. His smoking use included cigarettes. He has never used smokeless tobacco. Mr. Ferrare reports previous alcohol use of about 3.0 - 4.0 standard drinks of alcohol per week.   Review of Systems CONSTITUTIONAL: No weight loss, fever, chills, weakness or fatigue.  HEENT: Eyes: No visual loss, blurred vision, double vision or yellow sclerae.No hearing loss, sneezing, congestion, runny nose or sore throat.  SKIN: No rash or itching.  CARDIOVASCULAR: per hpi RESPIRATORY: No shortness of breath, cough or sputum.  GASTROINTESTINAL: No anorexia, nausea, vomiting or diarrhea. No abdominal pain or blood.  GENITOURINARY: No burning on urination, no polyuria NEUROLOGICAL: No headache, dizziness, syncope, paralysis, ataxia, numbness or tingling in the extremities. No change in bowel or bladder control.  MUSCULOSKELETAL: No muscle, back pain, joint pain or stiffness.  LYMPHATICS: No enlarged nodes. No history of splenectomy.  PSYCHIATRIC: No history of depression or anxiety.   ENDOCRINOLOGIC: No reports of sweating, cold or heat intolerance. No polyuria or polydipsia.  Marland Kitchen   Physical Examination Today's Vitals   07/02/19 0941  BP: 131/76  Pulse: 77  Temp: (!) 97.3 F (36.3 C)  SpO2: 97%  Weight: 212 lb (96.2 kg)  Height: 6\' 1"  (1.854 m)   Body mass index is 27.97 kg/m.;  Gen: resting comfortably, no acute distress HEENT: no scleral icterus, pupils equal round and reactive, no palptable cervical adenopathy,  CV: RRR, no m/rg, no jvd Resp:  Clear to auscultation bilaterally GI: abdomen is soft, non-tender, non-distended, normal bowel sounds, no hepatosplenomegaly MSK: extremities are warm, no edema.  Skin: warm, no rash Neuro:  no focal deficits Psych: appropriate affect   Diagnostic Studies  02/2012 Cath 1. Normal left ventricular function with an ejection fraction of at  least 55% with suggestion of minimal posterobasal inferior  hypocontractility.  2. Extensive coronary calcification involving the left main, left  anterior descending artery, left circumflex, and right coronary  arteries.  3. Probable ostial tapering of the left main to 60-70% with evidence  for AO pressure dampening by 10-15 mm concordant with ostial  stenosis.  4. 40% proximal circumflex with 70% circumflex marginal 1 stenosis.  5. Total occlusion of the right coronary artery at the ostium with  extensive collateralization to the posterior descending artery  vessel via the left coronary circulation.   03/2013 Echo LVEF 55-60%, no WMAs, grade I diastolic dysfunction, mild MR   02/2013 Carotid US Patent right CEA, <40% stenosis on left.   04/2015 Carotid US Patent RICA/CEA, LICA [id number]    Assessment and Plan   1. CAD  - no recent symptoms, continue current meds  2. Carotid stenosis  - follow up with vascular  - we will continue current medical therapy  3. HTN  -he is at goal, continue current meds  4. Hyperlipidemia  - request  labs from pcp, continue statin  5. OSA - continue to follow with Dr Radford Pax   6. Preoperative evaluation - tolerates greater than 4METs regularly without limitations. Recommend proceeding with knee surgery as planned   F/u 1 year       Arnoldo Lenis, M.D.

## 2019-07-02 NOTE — Patient Instructions (Signed)
Medication Instructions:  Your physician recommends that you continue on your current medications as directed. Please refer to the Current Medication list given to you today.   Labwork: NONE  Testing/Procedures: NONE  Follow-Up: Your physician wants you to follow-up in: 1 YEAR.  You will receive a reminder letter in the mail two months in advance. If you don't receive a letter, please call our office to schedule the follow-up appointment.   Any Other Special Instructions Will Be Listed Below (If Applicable).  PLEASE SCHEDULE A FOLLOW UP WITH TRACI TURNER    If you need a refill on your cardiac medications before your next appointment, please call your pharmacy.

## 2019-07-02 NOTE — Telephone Encounter (Signed)
Patient called stating the earliest appointment he could get with his cardiologist is Wednesday of next week and his surgery is on Thursday. He asked if someone call him and let him know if his surgery will be rescheduled or what.

## 2019-07-03 ENCOUNTER — Telehealth: Payer: Self-pay | Admitting: *Deleted

## 2019-07-03 NOTE — Telephone Encounter (Signed)
dme is APRIA.

## 2019-07-03 NOTE — Telephone Encounter (Signed)
Patient is scheduled for an appointment with Dr Radford Pax for Thursday 08/27/18 at 2:20.

## 2019-07-03 NOTE — Telephone Encounter (Signed)
Patient called back to inquire about status of scheduling surgery. States his cardiologist has cleared him for the procedure. Please advise; states he would like to have it done as soon as possible. Ph [phone].

## 2019-07-03 NOTE — Telephone Encounter (Signed)

## 2019-07-04 ENCOUNTER — Other Ambulatory Visit: Payer: Self-pay | Admitting: Orthopedic Surgery

## 2019-07-04 NOTE — Telephone Encounter (Signed)
Confirm with him knee arthroscopy

## 2019-07-04 NOTE — Telephone Encounter (Signed)
Yes, we have discussed and he does want to proceed with Arthroscopy  Orders placed for Dec 1st SALK with medial and lateral meniscectomy

## 2019-07-04 NOTE — Telephone Encounter (Signed)
He has okay for knee surgery from cardiology Your notes indicated knee arthroscopy vs replacement, he is calling to schedule, what do you want to post him for?

## 2019-07-08 NOTE — Patient Instructions (Signed)
[name] [name]  07/08/2019     @PREFPERIOPPHARMACY @   Your procedure is scheduled on  07/15/2019 .  Report to [facility] at  [place]  A.M.  Call this number if you have problems the morning of surgery:  [phone]   Remember:  Do not eat or drink after midnight.                       Take these medicines the morning of surgery with A SIP OF WATER  Nexium, metorpolol, synthroid.    Do not wear jewelry, make-up or nail polish.  Do not wear lotions, powders, or perfumes. Please wear deodorant and brush your teeth.  Do not shave 48 hours prior to surgery.  Men may shave face and neck.  Do not bring valuables to the hospital.  [facility] is not responsible for any belongings or valuables.  Contacts, dentures or bridgework may not be worn into surgery.  Leave your suitcase in the car.  After surgery it may be brought to your room.  For patients admitted to the hospital, discharge time will be determined by your treatment team.  Patients discharged the day of surgery will not be allowed to drive home.   Name and phone number of your driver:   family Special instructions:  None  Please read over the following fact sheets that you were given. Anesthesia Post-op Instructions and Care and Recovery After Surgery       Arthroscopic Knee Ligament Repair, Care After This sheet gives you information about how to care for yourself after your procedure. Your health care provider may also give you more specific instructions. If you have problems or questions, contact your health care provider. What can I expect after the procedure? After the procedure, it is common to have:  Pain in your knee.  Bruising and swelling on your knee, calf, and ankle for 3-4 days.  Fatigue. Follow these instructions at home: If you have a brace or immobilizer:  Wear the brace or immobilizer as told by your health care provider. Remove it only as told by your health care provider.  Loosen  the splint or immobilizer if your toes tingle, become numb, or turn cold and blue.  Keep the brace or immobilizer clean. Bathing  Do not take baths, swim, or use a hot tub until your health care provider approves. Ask your health care provider if you can take showers.  Keep your bandage (dressing) dry until your health care provider says that it can be removed. Cover it and your brace or immobilizer with a watertight covering when you take a shower. Incision care   Follow instructions from your health care provider about how to take care of your incision. Make sure you: ? Wash your hands with soap and water before you change your bandage (dressing). If soap and water are not available, use hand sanitizer. ? Change your dressing as told by your health care provider. ? Leave stitches (sutures), skin glue, or adhesive strips in place. These skin closures may need to stay in place for 2 weeks or longer. If adhesive strip edges start to loosen and curl up, you may trim the loose edges. Do not remove adhesive strips completely unless your health care provider tells you to do that.  Check your incision area every day for signs of infection. Check for: ? More redness, swelling, or pain. ? More fluid or blood. ?  Warmth. ? Pus or a bad smell. Managing pain, stiffness, and swelling   If directed, put ice on the affected area. ? If you have a removable brace or immobilizer, remove it as told by your health care provider. ? Put ice in a plastic bag. ? Place a towel between your skin and the bag or between your brace or immobilizer and the bag. ? Leave the ice on for 20 minutes, 2-3 times a day.  Move your toes often to avoid stiffness and to lessen swelling.  Raise (elevate) the injured area above the level of your heart while you are sitting or lying down. Driving  Do not drive until your health care provider approves. If you have a brace or immobilizer on your leg, ask your health care  provider when it is safe for you to drive.  Do not drive or use heavy machinery while taking prescription pain medicine. Activity  Rest as directed. Ask your health care provider what activities are safe for you.  Do physical therapy exercises as told by your health care provider. Physical therapy will help you regain strength and motion in your knee.  Follow instructions from your health care provider about: ? When you may start motion exercises. ? When you may start riding a stationary bike and doing other low-impact activities. ? When you may start to jog and do other high-impact activities. Safety  Do not use the injured limb to support your body weight until your health care provider says that you can. Use crutches as told by your health care provider. General instructions  Do not use any products that contain nicotine or tobacco, such as cigarettes and e-cigarettes. These can delay bone healing. If you need help quitting, ask your health care provider.  To prevent or treat constipation while you are taking prescription pain medicine, your health care provider may recommend that you: ? Drink enough fluid to keep your urine clear or pale yellow. ? Take over-the-counter or prescription medicines. ? Eat foods that are high in fiber, such as fresh fruits and vegetables, whole grains, and beans. ? Limit foods that are high in fat and processed sugars, such as fried and sweet foods.  Take over-the-counter and prescription medicines only as told by your health care provider.  Keep all follow-up visits as told by your health care provider. This is important. Contact a health care provider if:  You have more redness, swelling, or pain around an incision.  You have more fluid or blood coming from an incision.  Your incision feels warm to the touch.  You have a fever.  You have pain or swelling in your knee, and it gets worse.  You have pain that does not get better with medicine.  Get help right away if:  You have trouble breathing.  You have pus or a bad smell coming from an incision.  You have numbness and tingling near the knee joint. Summary  After the procedure, it is common to have knee pain with bruising and swelling on your knee, calf, and ankle.  Icing your knee and raising your leg above the level of your heart will help control the pain and the swelling.  Do physical therapy exercises as told by your health care provider. Physical therapy will help you regain strength and motion in your knee. This information is not intended to replace advice given to you by your health care provider. Make sure you discuss any questions you have with your health care  provider. Document Released: 05/21/2013 Document Revised: 07/13/2017 Document Reviewed: 07/25/2016 Elsevier Patient Education  2020 [place] Anesthesia, Adult, Care After This sheet gives you information about how to care for yourself after your procedure. Your health care provider may also give you more specific instructions. If you have problems or questions, contact your health care provider. What can I expect after the procedure? After the procedure, the following side effects are common:  Pain or discomfort at the IV site.  Nausea.  Vomiting.  Sore throat.  Trouble concentrating.  Feeling cold or chills.  Weak or tired.  Sleepiness and fatigue.  Soreness and body aches. These side effects can affect parts of the body that were not involved in surgery. Follow these instructions at home:  For at least 24 hours after the procedure:  Have a responsible adult stay with you. It is important to have someone help care for you until you are awake and alert.  Rest as needed.  Do not: ? Participate in activities in which you could fall or become injured. ? Drive. ? Use heavy machinery. ? Drink alcohol. ? Take sleeping pills or medicines that cause drowsiness. ? Make  important decisions or sign legal documents. ? Take care of children on your own. Eating and drinking  Follow any instructions from your health care provider about eating or drinking restrictions.  When you feel hungry, start by eating small amounts of foods that are soft and easy to digest (bland), such as toast. Gradually return to your regular diet.  Drink enough fluid to keep your urine pale yellow.  If you vomit, rehydrate by drinking water, juice, or clear broth. General instructions  If you have sleep apnea, surgery and certain medicines can increase your risk for breathing problems. Follow instructions from your health care provider about wearing your sleep device: ? Anytime you are sleeping, including during daytime naps. ? While taking prescription pain medicines, sleeping medicines, or medicines that make you drowsy.  Return to your normal activities as told by your health care provider. Ask your health care provider what activities are safe for you.  Take over-the-counter and prescription medicines only as told by your health care provider.  If you smoke, do not smoke without supervision.  Keep all follow-up visits as told by your health care provider. This is important. Contact a health care provider if:  You have nausea or vomiting that does not get better with medicine.  You cannot eat or drink without vomiting.  You have pain that does not get better with medicine.  You are unable to pass urine.  You develop a skin rash.  You have a fever.  You have redness around your IV site that gets worse. Get help right away if:  You have difficulty breathing.  You have chest pain.  You have blood in your urine or stool, or you vomit blood. Summary  After the procedure, it is common to have a sore throat or nausea. It is also common to feel tired.  Have a responsible adult stay with you for the first 24 hours after general anesthesia. It is important to have  someone help care for you until you are awake and alert.  When you feel hungry, start by eating small amounts of foods that are soft and easy to digest (bland), such as toast. Gradually return to your regular diet.  Drink enough fluid to keep your urine pale yellow.  Return to your normal activities as told by  your health care provider. Ask your health care provider what activities are safe for you. This information is not intended to replace advice given to you by your health care provider. Make sure you discuss any questions you have with your health care provider. Document Released: 11/06/2000 Document Revised: 08/03/2017 Document Reviewed: 03/16/2017 Elsevier Patient Education  2020 [employer]. How to Use Chlorhexidine for Bathing Chlorhexidine gluconate (CHG) is a germ-killing (antiseptic) solution that is used to clean the skin. It can get rid of the bacteria that normally live on the skin and can keep them away for about 24 hours. To clean your skin with CHG, you may be given:  A CHG solution to use in the shower or as part of a sponge bath.  A prepackaged cloth that contains CHG. Cleaning your skin with CHG may help lower the risk for infection:  While you are staying in the intensive care unit of the hospital.  If you have a vascular access, such as a central line, to provide short-term or long-term access to your veins.  If you have a catheter to drain urine from your bladder.  If you are on a ventilator. A ventilator is a machine that helps you breathe by moving air in and out of your lungs.  After surgery. What are the risks? Risks of using CHG include:  A skin reaction.  Hearing loss, if CHG gets in your ears.  Eye injury, if CHG gets in your eyes and is not rinsed out.  The CHG product catching fire. Make sure that you avoid smoking and flames after applying CHG to your skin. Do not use CHG:  If you have a chlorhexidine allergy or have previously reacted to  chlorhexidine.  On babies younger than 5 months of age. How to use CHG solution  Use CHG only as told by your health care provider, and follow the instructions on the label.  Use the full amount of CHG as directed. Usually, this is one bottle. During a shower Follow these steps when using CHG solution during a shower (unless your health care provider gives you different instructions): 1. Start the shower. 2. Use your normal soap and shampoo to wash your face and hair. 3. Turn off the shower or move out of the shower stream. 4. Pour the CHG onto a clean washcloth. Do not use any type of brush or rough-edged sponge. 5. Starting at your neck, lather your body down to your toes. Make sure you follow these instructions: ? If you will be having surgery, pay special attention to the part of your body where you will be having surgery. Scrub this area for at least 1 minute. ? Do not use CHG on your head or face. If the solution gets into your ears or eyes, rinse them well with water. ? Avoid your genital area. ? Avoid any areas of skin that have broken skin, cuts, or scrapes. ? Scrub your back and under your arms. Make sure to wash skin folds. 6. Let the lather sit on your skin for 1-2 minutes or as long as told by your health care provider. 7. Thoroughly rinse your entire body in the shower. Make sure that all body creases and crevices are rinsed well. 8. Dry off with a clean towel. Do not put any substances on your body afterward-such as powder, lotion, or perfume-unless you are told to do so by your health care provider. Only use lotions that are recommended by the manufacturer. 9. Put on  clean clothes or pajamas. 10. If it is the night before your surgery, sleep in clean sheets.  During a sponge bath Follow these steps when using CHG solution during a sponge bath (unless your health care provider gives you different instructions): 1. Use your normal soap and shampoo to wash your face and hair.  2. Pour the CHG onto a clean washcloth. 3. Starting at your neck, lather your body down to your toes. Make sure you follow these instructions: ? If you will be having surgery, pay special attention to the part of your body where you will be having surgery. Scrub this area for at least 1 minute. ? Do not use CHG on your head or face. If the solution gets into your ears or eyes, rinse them well with water. ? Avoid your genital area. ? Avoid any areas of skin that have broken skin, cuts, or scrapes. ? Scrub your back and under your arms. Make sure to wash skin folds. 4. Let the lather sit on your skin for 1-2 minutes or as long as told by your health care provider. 5. Using a different clean, wet washcloth, thoroughly rinse your entire body. Make sure that all body creases and crevices are rinsed well. 6. Dry off with a clean towel. Do not put any substances on your body afterward-such as powder, lotion, or perfume-unless you are told to do so by your health care provider. Only use lotions that are recommended by the manufacturer. 7. Put on clean clothes or pajamas. 8. If it is the night before your surgery, sleep in clean sheets. How to use CHG prepackaged cloths  Only use CHG cloths as told by your health care provider, and follow the instructions on the label.  Use the CHG cloth on clean, dry skin.  Do not use the CHG cloth on your head or face unless your health care provider tells you to.  When washing with the CHG cloth: ? Avoid your genital area. ? Avoid any areas of skin that have broken skin, cuts, or scrapes. Before surgery Follow these steps when using a CHG cloth to clean before surgery (unless your health care provider gives you different instructions): 1. Using the CHG cloth, vigorously scrub the part of your body where you will be having surgery. Scrub using a back-and-forth motion for 3 minutes. The area on your body should be completely wet with CHG when you are done  scrubbing. 2. Do not rinse. Discard the cloth and let the area air-dry. Do not put any substances on the area afterward, such as powder, lotion, or perfume. 3. Put on clean clothes or pajamas. 4. If it is the night before your surgery, sleep in clean sheets.  For general bathing Follow these steps when using CHG cloths for general bathing (unless your health care provider gives you different instructions). 1. Use a separate CHG cloth for each area of your body. Make sure you wash between any folds of skin and between your fingers and toes. Wash your body in the following order, switching to a new cloth after each step: ? The front of your neck, shoulders, and chest. ? Both of your arms, under your arms, and your hands. ? Your stomach and groin area, avoiding the genitals. ? Your right leg and foot. ? Your left leg and foot. ? The back of your neck, your back, and your buttocks. 2. Do not rinse. Discard the cloth and let the area air-dry. Do not put any substances on  your body afterward-such as powder, lotion, or perfume-unless you are told to do so by your health care provider. Only use lotions that are recommended by the manufacturer. 3. Put on clean clothes or pajamas. Contact a health care provider if:  Your skin gets irritated after scrubbing.  You have questions about using your solution or cloth. Get help right away if:  Your eyes become very red or swollen.  Your eyes itch badly.  Your skin itches badly and is red or swollen.  Your hearing changes.  You have trouble seeing.  You have swelling or tingling in your mouth or throat.  You have trouble breathing.  You swallow any chlorhexidine. Summary  Chlorhexidine gluconate (CHG) is a germ-killing (antiseptic) solution that is used to clean the skin. Cleaning your skin with CHG may help to lower your risk for infection.  You may be given CHG to use for bathing. It may be in a bottle or in a prepackaged cloth to use on  your skin. Carefully follow your health care provider's instructions and the instructions on the product label.  Do not use CHG if you have a chlorhexidine allergy.  Contact your health care provider if your skin gets irritated after scrubbing. This information is not intended to replace advice given to you by your health care provider. Make sure you discuss any questions you have with your health care provider. Document Released: 04/24/2012 Document Revised: 10/17/2018 Document Reviewed: 06/28/2017 Elsevier Patient Education  2020 [employer].

## 2019-07-09 ENCOUNTER — Encounter (HOSPITAL_COMMUNITY)
Admission: RE | Admit: 2019-07-09 | Discharge: 2019-07-09 | Disposition: A | Discharge status: Home / Self Care | Payer: Medicare HMO | Source: Ambulatory Visit | Attending: Orthopedic Surgery | Admitting: Orthopedic Surgery

## 2019-07-09 ENCOUNTER — Other Ambulatory Visit: Payer: Self-pay

## 2019-07-09 ENCOUNTER — Encounter (HOSPITAL_COMMUNITY): Payer: Self-pay

## 2019-07-09 DIAGNOSIS — Z01812 Encounter for preprocedural laboratory examination: Secondary | ICD-10-CM | POA: Insufficient documentation

## 2019-07-09 DIAGNOSIS — S83242A Other tear of medial meniscus, current injury, left knee, initial encounter: Secondary | ICD-10-CM | POA: Insufficient documentation

## 2019-07-09 DIAGNOSIS — S83282A Other tear of lateral meniscus, current injury, left knee, initial encounter: Secondary | ICD-10-CM | POA: Diagnosis not present

## 2019-07-09 HISTORY — DX: Polyneuropathy, unspecified: G62.9

## 2019-07-09 HISTORY — DX: Unspecified osteoarthritis, unspecified site: M19.90

## 2019-07-09 LAB — BASIC METABOLIC PANEL
Anion gap: 11 (ref 5–15)
BUN: 20 mg/dL (ref 8–23)
CO2: 25 mmol/L (ref 22–32)
Calcium: 9.1 mg/dL (ref 8.9–10.3)
Chloride: 100 mmol/L (ref 98–111)
Creatinine, Ser: 1.05 mg/dL (ref 0.61–1.24)
GFR calc Af Amer: >60 mL/min (ref >60)
GFR calc non Af Amer: >60 mL/min (ref >60)
Glucose, Bld: 128 mg/dL — ABNORMAL HIGH (ref 70–99)
Potassium: 4.5 mmol/L (ref 3.5–5.1)
Sodium: 136 mmol/L (ref 135–145)

## 2019-07-09 LAB — CBC WITH DIFFERENTIAL/PLATELET
Abs Immature Granulocytes: 0.01 10*3/uL (ref 0.00–0.07)
Basophils Absolute: 0 10*3/uL (ref 0.0–0.1)
Basophils Relative: 1 %
Eosinophils Absolute: 0.2 10*3/uL (ref 0.0–0.5)
Eosinophils Relative: 3 %
HCT: 42.6 % (ref 39.0–52.0)
Hemoglobin: 14.4 g/dL (ref 13.0–17.0)
Immature Granulocytes: 0 %
Lymphocytes Relative: 25 %
Lymphs Abs: 1.4 10*3/uL (ref 0.7–4.0)
MCH: 32.4 pg (ref 26.0–34.0)
MCHC: 33.8 g/dL (ref 30.0–36.0)
MCV: 95.9 fL (ref 80.0–100.0)
Monocytes Absolute: 0.5 10*3/uL (ref 0.1–1.0)
Monocytes Relative: 10 %
Neutro Abs: 3.4 10*3/uL (ref 1.7–7.7)
Neutrophils Relative %: 61 %
Platelets: 181 10*3/uL (ref 150–400)
RBC: 4.44 MIL/uL (ref 4.22–5.81)
RDW: 12.4 % (ref 11.5–15.5)
WBC: 5.5 10*3/uL (ref 4.0–10.5)
nRBC: 0 % (ref 0.0–0.2)

## 2019-07-14 ENCOUNTER — Telehealth: Payer: Self-pay | Admitting: Radiology

## 2019-07-14 ENCOUNTER — Other Ambulatory Visit (HOSPITAL_COMMUNITY)
Admission: RE | Admit: 2019-07-14 | Discharge: 2019-07-14 | Disposition: A | Discharge status: Home / Self Care | Payer: Medicare HMO | Source: Ambulatory Visit | Attending: Orthopedic Surgery | Admitting: Orthopedic Surgery

## 2019-07-14 ENCOUNTER — Other Ambulatory Visit: Payer: Self-pay

## 2019-07-14 DIAGNOSIS — Z20828 Contact with and (suspected) exposure to other viral communicable diseases: Secondary | ICD-10-CM | POA: Diagnosis not present

## 2019-07-14 DIAGNOSIS — Z01812 Encounter for preprocedural laboratory examination: Secondary | ICD-10-CM | POA: Insufficient documentation

## 2019-07-14 LAB — SARS CORONAVIRUS 2 (TAT 6-24 HRS): SARS Coronavirus 2: NEGATIVE

## 2019-07-14 NOTE — H&P (Signed)
Chief Complaint  Patient presents with  . Knee Pain      left     78 year old male we have been following for left knee pain.  He had aspiration injection he was on meloxicam and took that for a while and comes back in to see if he still having left knee pain and intermittent giving way episodes with the plan for MRI if still symptomatic       Chief Complaint  Patient presents with  . Knee Pain      L/hurts and its swollen some for a month or more    78 year old male presents with a 4-week history of pain on the medial side of his left knee is started atraumatically and acutely.  The pain is moderate to severe he had 2 weeks of anti-inflammatories then got a cortisone shot and is now taken meloxicam for a total of 4 weeks with no improvement in his symptoms      has a past medical history of CAD (coronary artery disease), CAD (coronary artery disease) (12/04/2011), CAD (coronary artery disease), CHF (congestive heart failure) ([place]), Deviated septum, Dyslipidemia, GERD (gastroesophageal reflux disease), Heart attack ([place]), Hyperlipidemia, Hypertension, Hypothyroidism, MI (myocardial infarction) ([place]) (1997), Obesity (BMI 30-39.9) (09/02/2018), PONV (postoperative nausea and vomiting), 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  Past Surgical History:  Procedure Laterality Date  . ADENOIDECTOMY    . CARDIAC CATHETERIZATION     02/22/12  . CARDIOVASCULAR STRESS TEST  7/13  . CAROTID ENDARTERECTOMY    . COLONOSCOPY  15-20years and 12/04/11   Done at [place]  12/11/2011   Procedure: COLONOSCOPY;  Surgeon: Rogene Houston, MD;  Location: [facility];  Service: Endoscopy;  Laterality: N/A;  830  . CORONARY ARTERY BYPASS GRAFT  02/29/2012   Procedure: CORONARY ARTERY BYPASS GRAFTING (CABG);   Surgeon: Rexene Alberts, MD;  Location: [facility];  Service: Open Heart Surgery;  Laterality: N/A;  times three using Left Internal Mammary Artery and Right Greater Saphenous Vein Graft Harvested Endoscopically  . CORONARY STENT PLACEMENT    . ENDARTERECTOMY  02/29/2012   Procedure: ENDARTERECTOMY CAROTID;  Surgeon: Angelia Mould, MD;  Location: [facility];  Service: Vascular;  Laterality: Right;  . FOOT SURGERY  06/30/11   artificial joint in big toe right foot   . LEFT HEART CATHETERIZATION WITH CORONARY ANGIOGRAM N/A 02/20/2012   Procedure: LEFT HEART CATHETERIZATION WITH CORONARY ANGIOGRAM;  Surgeon: Troy Sine, MD;  Location: [facility] CATH LAB;  Service: Cardiovascular;  Laterality: N/A;  . NASAL SEPTUM SURGERY    . [place]ol thyroidectomy  02/06/2011  . TRANSESOPHAGEAL ECHOCARDIOGRAM    . UPPER GASTROINTESTINAL ENDOSCOPY  35-40 years ago   [facility]    Family History  Problem Relation Age of Onset  . Hypertension Mother   . Healthy Sister   . Healthy Brother   . Healthy Son   . Heart attack Father   . Heart disease Father        Before age 33  . Hyperlipidemia Father   . Hypertension Father   . Prostate cancer Father   . Colon cancer Neg Hx     Social History   Tobacco Use  . Smoking status: Former Smoker    Packs/day: 0.50    Years: 15.00    Pack years: 7.50  Types: Cigarettes    Quit date: 09/04/2001    Years since quitting: 17.8  . Smokeless tobacco: Never Used  . Tobacco comment: smoked 55yrs, smoked 1/2 pack a day  Substance Use Topics  . Alcohol use: Not Currently    Alcohol/week: 3.0 - 4.0 standard drinks    Types: 3 - 4 Cans of beer per week    Comment: social- beer , not on regular basis  . Drug use: No     BP (!) 164/90   Pulse 97   Ht 6\' 1"  (1.854 m)   Wt 209 lb (94.8 kg)   BMI 27.57 kg/m   Physical Exam Constitutional:      General: He is not in acute distress.    Appearance: Normal appearance. He is normal weight. He is not toxic-appearing.   HENT:     Head: Normocephalic and atraumatic.     Nose: No congestion or rhinorrhea.     Mouth/Throat:     Mouth: Mucous membranes are dry.     Pharynx: Oropharynx is clear. No oropharyngeal exudate or posterior oropharyngeal erythema.  Eyes:     General: No scleral icterus.       Right eye: No discharge.        Left eye: No discharge.     Pupils: Pupils are equal, round, and reactive to light.  Neck:     Musculoskeletal: No neck rigidity.  Cardiovascular:     Rate and Rhythm: Normal rate.     Pulses: Normal pulses.  Pulmonary:     Effort: Pulmonary effort is normal.  Musculoskeletal:     Comments: LEFT KNEE JOINT:  Joint effusion left knee tenderness medial joint line he can fully extend but he has a little tightness with flexion knee feels stable muscle tone is normal neurovascular exam is intact skin is normal   Skin:    General: Skin is warm.     Capillary Refill: Capillary refill takes less than 2 seconds.  Neurological:     General: No focal deficit present.     Mental Status: He is alert and oriented to person, place, and time. Mental status is at baseline.     Sensory: No sensory deficit.  Psychiatric:        Mood and Affect: Mood normal.        Behavior: Behavior normal.        Thought Content: Thought content normal.        Judgment: Judgment normal.    IMPRESSION/MRI 1. Horizontal tear of the posterior horn of the medial meniscus extending to the superior articular surface. 2. Small oblique tear of the posterior horn of the lateral meniscus extending to the superior articular surface. 3. Tricompartmental cartilage abnormalities as described above. 4. Large joint effusion.     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  He has opted for left knee arthroscopy

## 2019-07-14 NOTE — Telephone Encounter (Signed)
I have faxed pre auth information to [employer]

## 2019-07-14 NOTE — Telephone Encounter (Signed)
-----   Message from Bo Mcclintock, [place] sent at 07/14/2019  1:42 PM EST ----- Regarding: Jasmine [place]ates with [facility] called to speak with you about this patient. Please call [phone] ext# [phone]

## 2019-07-14 NOTE — Telephone Encounter (Signed)
I left message for [facility] to let her know.

## 2019-07-15 ENCOUNTER — Telehealth: Payer: Self-pay | Admitting: Orthopedic Surgery

## 2019-07-15 ENCOUNTER — Ambulatory Visit (HOSPITAL_COMMUNITY): Payer: Medicare HMO | Admitting: Anesthesiology

## 2019-07-15 ENCOUNTER — Encounter (HOSPITAL_COMMUNITY)
Admission: RE | Disposition: A | Discharge status: Home / Self Care | Payer: Self-pay | Source: Home / Self Care | Attending: Orthopedic Surgery

## 2019-07-15 ENCOUNTER — Encounter (HOSPITAL_COMMUNITY): Payer: Self-pay | Admitting: *Deleted

## 2019-07-15 ENCOUNTER — Ambulatory Visit (HOSPITAL_COMMUNITY)
Admission: RE | Admit: 2019-07-15 | Discharge: 2019-07-15 | Disposition: A | Discharge status: Home / Self Care | Payer: Medicare HMO | Attending: Orthopedic Surgery | Admitting: Orthopedic Surgery

## 2019-07-15 ENCOUNTER — Other Ambulatory Visit: Payer: Self-pay | Admitting: Orthopedic Surgery

## 2019-07-15 DIAGNOSIS — Z87891 Personal history of nicotine dependence: Secondary | ICD-10-CM | POA: Insufficient documentation

## 2019-07-15 DIAGNOSIS — G4733 Obstructive sleep apnea (adult) (pediatric): Secondary | ICD-10-CM | POA: Insufficient documentation

## 2019-07-15 DIAGNOSIS — I110 Hypertensive heart disease with heart failure: Secondary | ICD-10-CM | POA: Insufficient documentation

## 2019-07-15 DIAGNOSIS — M23301 Other meniscus derangements, unspecified lateral meniscus, left knee: Secondary | ICD-10-CM | POA: Diagnosis not present

## 2019-07-15 DIAGNOSIS — I2510 Atherosclerotic heart disease of native coronary artery without angina pectoris: Secondary | ICD-10-CM | POA: Diagnosis not present

## 2019-07-15 DIAGNOSIS — Z8249 Family history of ischemic heart disease and other diseases of the circulatory system: Secondary | ICD-10-CM | POA: Diagnosis not present

## 2019-07-15 DIAGNOSIS — E039 Hypothyroidism, unspecified: Secondary | ICD-10-CM | POA: Diagnosis not present

## 2019-07-15 DIAGNOSIS — Z955 Presence of coronary angioplasty implant and graft: Secondary | ICD-10-CM | POA: Insufficient documentation

## 2019-07-15 DIAGNOSIS — M23252 Derangement of posterior horn of lateral meniscus due to old tear or injury, left knee: Secondary | ICD-10-CM | POA: Insufficient documentation

## 2019-07-15 DIAGNOSIS — M23222 Derangement of posterior horn of medial meniscus due to old tear or injury, left knee: Secondary | ICD-10-CM | POA: Diagnosis not present

## 2019-07-15 DIAGNOSIS — M25462 Effusion, left knee: Secondary | ICD-10-CM | POA: Insufficient documentation

## 2019-07-15 DIAGNOSIS — M1712 Unilateral primary osteoarthritis, left knee: Secondary | ICD-10-CM | POA: Insufficient documentation

## 2019-07-15 DIAGNOSIS — I252 Old myocardial infarction: Secondary | ICD-10-CM | POA: Diagnosis not present

## 2019-07-15 DIAGNOSIS — I509 Heart failure, unspecified: Secondary | ICD-10-CM | POA: Insufficient documentation

## 2019-07-15 DIAGNOSIS — S83282A Other tear of lateral meniscus, current injury, left knee, initial encounter: Secondary | ICD-10-CM | POA: Diagnosis not present

## 2019-07-15 DIAGNOSIS — Z951 Presence of aortocoronary bypass graft: Secondary | ICD-10-CM | POA: Diagnosis not present

## 2019-07-15 DIAGNOSIS — S83242A Other tear of medial meniscus, current injury, left knee, initial encounter: Secondary | ICD-10-CM | POA: Diagnosis not present

## 2019-07-15 DIAGNOSIS — E669 Obesity, unspecified: Secondary | ICD-10-CM | POA: Insufficient documentation

## 2019-07-15 DIAGNOSIS — M23322 Other meniscus derangements, posterior horn of medial meniscus, left knee: Secondary | ICD-10-CM | POA: Diagnosis not present

## 2019-07-15 DIAGNOSIS — Z6827 Body mass index (BMI) 27.0-27.9, adult: Secondary | ICD-10-CM | POA: Insufficient documentation

## 2019-07-15 HISTORY — PX: KNEE ARTHROSCOPY WITH MEDIAL MENISECTOMY: SHX5651

## 2019-07-15 SURGERY — ARTHROSCOPY, KNEE, WITH MEDIAL MENISCECTOMY
Anesthesia: General | Site: Knee | Laterality: Left

## 2019-07-15 MED ORDER — LIDOCAINE HCL (CARDIAC) PF 100 MG/5ML IV SOSY
PREFILLED_SYRINGE | INTRAVENOUS | Status: DC | PRN
  Administered 2019-07-15: 60 mg via INTRAVENOUS

## 2019-07-15 MED ORDER — CEFAZOLIN SODIUM-DEXTROSE 2-4 GM/100ML-% IV SOLN
2.0000 g | INTRAVENOUS | Status: AC
  Administered 2019-07-15: 2 g via INTRAVENOUS
  Filled 2019-07-15: qty 100

## 2019-07-15 MED ORDER — LIDOCAINE 2% (20 MG/ML) 5 ML SYRINGE
INTRAMUSCULAR | Status: AC
  Filled 2019-07-15: qty 5

## 2019-07-15 MED ORDER — ONDANSETRON HCL 4 MG/2ML IJ SOLN
INTRAMUSCULAR | Status: AC
  Filled 2019-07-15: qty 2

## 2019-07-15 MED ORDER — SODIUM CHLORIDE 0.9 % IR SOLN
Status: DC | PRN
  Administered 2019-07-15: 1000 mL

## 2019-07-15 MED ORDER — LACTATED RINGERS IV SOLN: INTRAVENOUS | Status: DC

## 2019-07-15 MED ORDER — PROMETHAZINE HCL 25 MG/ML IJ SOLN: 6.2500 mg | INTRAMUSCULAR | Status: DC | PRN

## 2019-07-15 MED ORDER — MIDAZOLAM HCL 2 MG/2ML IJ SOLN: 0.5000 mg | Freq: Once | INTRAMUSCULAR | Status: DC | PRN

## 2019-07-15 MED ORDER — FENTANYL CITRATE (PF) 100 MCG/2ML IJ SOLN
INTRAMUSCULAR | Status: AC
  Filled 2019-07-15: qty 2

## 2019-07-15 MED ORDER — EPHEDRINE SULFATE 50 MG/ML IJ SOLN
INTRAMUSCULAR | Status: DC | PRN
  Administered 2019-07-15 (×2): 10 mg via INTRAVENOUS

## 2019-07-15 MED ORDER — FENTANYL CITRATE (PF) 100 MCG/2ML IJ SOLN
INTRAMUSCULAR | Status: DC | PRN
  Administered 2019-07-15: 50 ug via INTRAVENOUS
  Administered 2019-07-15: 25 ug via INTRAVENOUS

## 2019-07-15 MED ORDER — PROPOFOL 10 MG/ML IV BOLUS
INTRAVENOUS | Status: AC
  Filled 2019-07-15: qty 40

## 2019-07-15 MED ORDER — ONDANSETRON HCL 4 MG/2ML IJ SOLN
INTRAMUSCULAR | Status: DC | PRN
  Administered 2019-07-15: 4 mg via INTRAVENOUS

## 2019-07-15 MED ORDER — HYDROCODONE-ACETAMINOPHEN 5-325 MG PO TABS
1.0000 | ORAL_TABLET | Freq: Once | ORAL | Status: AC
  Administered 2019-07-15: 1 via ORAL

## 2019-07-15 MED ORDER — BUPIVACAINE-EPINEPHRINE (PF) 0.25% -1:200000 IJ SOLN
INTRAMUSCULAR | Status: AC
  Filled 2019-07-15: qty 60

## 2019-07-15 MED ORDER — EPINEPHRINE PF 1 MG/ML IJ SOLN
INTRAMUSCULAR | Status: AC
  Filled 2019-07-15: qty 5

## 2019-07-15 MED ORDER — PROPOFOL 10 MG/ML IV BOLUS
INTRAVENOUS | Status: DC | PRN
  Administered 2019-07-15: 50 mg via INTRAVENOUS
  Administered 2019-07-15: 150 mg via INTRAVENOUS

## 2019-07-15 MED ORDER — BUPIVACAINE-EPINEPHRINE (PF) 0.25% -1:200000 IJ SOLN
INTRAMUSCULAR | Status: DC | PRN
  Administered 2019-07-15: 60 mL via PERINEURAL

## 2019-07-15 MED ORDER — EPHEDRINE 5 MG/ML INJ
INTRAVENOUS | Status: AC
  Filled 2019-07-15: qty 10

## 2019-07-15 MED ORDER — SODIUM CHLORIDE 0.9 % IR SOLN
Status: DC | PRN
  Administered 2019-07-15 (×4): 3000 mL

## 2019-07-15 MED ORDER — PROMETHAZINE HCL 12.5 MG PO TABS: 12.5000 mg | ORAL_TABLET | Freq: Four times a day (QID) | ORAL | 0 refills | Status: DC | PRN

## 2019-07-15 MED ORDER — HYDROCODONE-ACETAMINOPHEN 7.5-325 MG PO TABS
1.0000 | ORAL_TABLET | Freq: Once | ORAL | Status: DC | PRN
  Filled 2019-07-15: qty 1

## 2019-07-15 MED ORDER — HYDROMORPHONE HCL 1 MG/ML IJ SOLN: 0.2500 mg | INTRAMUSCULAR | Status: DC | PRN

## 2019-07-15 MED ORDER — CHLORHEXIDINE GLUCONATE 4 % EX LIQD: 60.0000 mL | Freq: Once | CUTANEOUS | Status: DC

## 2019-07-15 MED ORDER — LACTATED RINGERS IV SOLN
INTRAVENOUS | Status: DC | PRN
  Administered 2019-07-15: 11:00:00 via INTRAVENOUS

## 2019-07-15 MED ORDER — HYDROCODONE-ACETAMINOPHEN 5-325 MG PO TABS: 1.0000 | ORAL_TABLET | ORAL | 0 refills | Status: AC | PRN

## 2019-07-15 MED ORDER — ONDANSETRON HCL 4 MG/2ML IJ SOLN
4.0000 mg | Freq: Once | INTRAMUSCULAR | Status: AC
  Administered 2019-07-15: 4 mg via INTRAVENOUS

## 2019-07-15 MED ORDER — HYDROCODONE-ACETAMINOPHEN 5-325 MG PO TABS
ORAL_TABLET | ORAL | Status: AC
  Filled 2019-07-15: qty 1

## 2019-07-15 MED ORDER — GLYCOPYRROLATE PF 0.2 MG/ML IJ SOSY
PREFILLED_SYRINGE | INTRAMUSCULAR | Status: DC | PRN
  Administered 2019-07-15: .2 mg via INTRAVENOUS

## 2019-07-15 SURGICAL SUPPLY — 55 items
APL PRP STRL LF DISP 70% ISPRP (MISCELLANEOUS) ×1
ARTHROWAND PARAGON T2 (SURGICAL WAND)
BLADE AGGRESSIVE PLUS 4.0 (BLADE) ×3 IMPLANT
BLADE SURG SZ11 CARB STEEL (BLADE) ×3 IMPLANT
BNDG CMPR STD VLCR NS LF 5.8X6 (GAUZE/BANDAGES/DRESSINGS) ×1
BNDG ELASTIC 6X5.8 VLCR NS LF (GAUZE/BANDAGES/DRESSINGS) ×3 IMPLANT
CHLORAPREP W/TINT 26 (MISCELLANEOUS) ×3 IMPLANT
CLOTH BEACON ORANGE TIMEOUT ST (SAFETY) ×3 IMPLANT
COOLER CRYO IC GRAV AND TUBE (ORTHOPEDIC SUPPLIES) ×3 IMPLANT
COVER WAND RF STERILE (DRAPES) ×3 IMPLANT
CUFF CRYO KNEE18X23 MED (MISCELLANEOUS) IMPLANT
CUFF TOURN SGL QUICK 34 (TOURNIQUET CUFF) ×3
CUFF TRNQT CYL 34X4.125X (TOURNIQUET CUFF) IMPLANT
CUTTER ANGLED DBL BITE 4.5 (BURR) IMPLANT
DECANTER SPIKE VIAL GLASS SM (MISCELLANEOUS) ×6 IMPLANT
GAUZE 4X4 16PLY RFD (DISPOSABLE) ×3 IMPLANT
GAUZE SPONGE 4X4 12PLY STRL (GAUZE/BANDAGES/DRESSINGS) ×3 IMPLANT
GAUZE SPONGE 4X4 16PLY XRAY LF (GAUZE/BANDAGES/DRESSINGS) ×3 IMPLANT
GAUZE XEROFORM 5X9 LF (GAUZE/BANDAGES/DRESSINGS) ×3 IMPLANT
GLOVE BIO SURGEON STRL SZ7 (GLOVE) ×2 IMPLANT
GLOVE BIOGEL PI IND STRL 7.0 (GLOVE) ×2 IMPLANT
GLOVE BIOGEL PI INDICATOR 7.0 (GLOVE) ×4
GLOVE SKINSENSE NS SZ8.0 LF (GLOVE) ×2
GLOVE SKINSENSE STRL SZ8.0 LF (GLOVE) ×1 IMPLANT
GLOVE SS N UNI LF 8.5 STRL (GLOVE) ×3 IMPLANT
GOWN STRL REUS W/ TWL LRG LVL3 (GOWN DISPOSABLE) ×1 IMPLANT
GOWN STRL REUS W/TWL LRG LVL3 (GOWN DISPOSABLE) ×3
GOWN STRL REUS W/TWL XL LVL3 (GOWN DISPOSABLE) ×3 IMPLANT
IV NS IRRIG 3000ML ARTHROMATIC (IV SOLUTION) ×10 IMPLANT
KIT BLADEGUARD II DBL (SET/KITS/TRAYS/PACK) ×3 IMPLANT
KIT TURNOVER CYSTO (KITS) ×3 IMPLANT
MANIFOLD NEPTUNE II (INSTRUMENTS) ×3 IMPLANT
MARKER SKIN DUAL TIP RULER LAB (MISCELLANEOUS) ×3 IMPLANT
NDL HYPO 18GX1.5 BLUNT FILL (NEEDLE) ×1 IMPLANT
NDL HYPO 21X1.5 SAFETY (NEEDLE) ×1 IMPLANT
NDL SPNL 18GX3.5 QUINCKE PK (NEEDLE) ×1 IMPLANT
NEEDLE HYPO 18GX1.5 BLUNT FILL (NEEDLE) ×3 IMPLANT
NEEDLE HYPO 21X1.5 SAFETY (NEEDLE) ×3 IMPLANT
NEEDLE SPNL 18GX3.5 QUINCKE PK (NEEDLE) ×3 IMPLANT
NS IRRIG 1000ML POUR BTL (IV SOLUTION) ×3 IMPLANT
PACK ARTHRO LIMB DRAPE STRL (MISCELLANEOUS) ×3 IMPLANT
PAD ABD 5X9 TENDERSORB (GAUZE/BANDAGES/DRESSINGS) ×3 IMPLANT
PAD ARMBOARD 7.5X6 YLW CONV (MISCELLANEOUS) ×3 IMPLANT
PADDING CAST COTTON 6X4 STRL (CAST SUPPLIES) ×3 IMPLANT
PROBE BIPOLAR 50 DEGREE SUCT (MISCELLANEOUS) IMPLANT
PROBE BIPOLAR ATHRO 135MM 90D (MISCELLANEOUS) IMPLANT
SET ARTHROSCOPY INST (INSTRUMENTS) ×3 IMPLANT
SET BASIN LINEN APH (SET/KITS/TRAYS/PACK) ×3 IMPLANT
SUT ETHILON 3 0 FSL (SUTURE) ×3 IMPLANT
SYR 10ML LL (SYRINGE) ×3 IMPLANT
SYR 30ML LL (SYRINGE) ×3 IMPLANT
TUBE CONNECTING 12'X1/4 (SUCTIONS) ×2
TUBE CONNECTING 12X1/4 (SUCTIONS) ×4 IMPLANT
TUBING ARTHRO INFLOW-ONLY STRL (TUBING) ×3 IMPLANT
WAND ARTHRO PARAGON T2 (SURGICAL WAND) IMPLANT

## 2019-07-15 NOTE — Telephone Encounter (Signed)
The shipment came in, he was able to get the meds

## 2019-07-15 NOTE — Interval H&P Note (Signed)
History and Physical Interval Note:  07/15/2019 10:21 AM  [name] [name]  has presented today for surgery, with the diagnosis of Left knee torn medial and lateral meniscus.  The various methods of treatment have been discussed with the patient and family. After consideration of risks, benefits and other options for treatment, the patient has consented to  Procedure(s): KNEE ARTHROSCOPY WITH MEDIAL MENISCECTOMY AND LATERAL MENISCECTOMY (Left) as a surgical intervention.  The patient's history has been reviewed, patient examined, no change in status, stable for surgery.  I have reviewed the patient's chart and labs.  Questions were answered to the patient's satisfaction.     Arther Abbott

## 2019-07-15 NOTE — Anesthesia Preprocedure Evaluation (Addendum)
Anesthesia Evaluation  Patient identified by MRN, date of birth, ID band Patient awake    Reviewed: Allergy & Precautions, NPO status , Patient's Chart, lab work & pertinent test results  History of Anesthesia Complications (+) PONV  Airway Mallampati: II  TM Distance: >3 FB Neck ROM: Full    Dental no notable dental hx. 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 States had stent placed in 1995 2019 Study of Carotids [place]   Neuro/Psych negative neurological ROS  negative psych ROS   GI/Hepatic Neg liver ROS, GERD  Medicated and Controlled,  Endo/Other  Hypothyroidism   Renal/GU negative Renal ROS  negative genitourinary   Musculoskeletal  (+) Arthritis , Osteoarthritis,    Abdominal   Peds negative pediatric ROS (+)  Hematology negative hematology ROS (+)   Anesthesia Other Findings   Reproductive/Obstetrics negative OB ROS                            Anesthesia Physical Anesthesia Plan  ASA: III  Anesthesia Plan: General   Post-op Pain Management:    Induction: Intravenous  PONV Risk Score and Plan: 3 and Dexamethasone, Ondansetron, Midazolam and Treatment may vary due to age or medical condition  Airway Management Planned: Oral ETT and LMA  Additional Equipment:   Intra-op Plan:   Post-operative Plan: Extubation in OR  Informed Consent: I have reviewed the patients History and Physical, chart, labs and  discussed the procedure including the risks, benefits and alternatives for the proposed anesthesia with the patient or authorized representative who has indicated his/her understanding and acceptance.     Dental advisory given  Plan Discussed with: CRNA  Anesthesia Plan Comments: (Plan Full PPE use Plan GA(LMA) vs.GETA D/W PT -WTP with same after Q&A)        Anesthesia Quick Evaluation

## 2019-07-15 NOTE — Telephone Encounter (Signed)
Call her back , ask her to use another pharmacy

## 2019-07-15 NOTE — Brief Op Note (Signed)
07/15/2019  12:08 PM  PATIENT:  [name] [name]  78 y.o. male  PRE-OPERATIVE DIAGNOSIS:  Left knee torn medial and lateral meniscus, ao   POST-OPERATIVE DIAGNOSIS:  Left knee torn medial and lateral meniscus, oa   PROCEDURE:  Procedure(s): KNEE ARTHROSCOPY WITH MEDIAL MENISCECTOMY AND LATERAL MENISCECTOMY [id number]  FINDINGS  Medial compartment: Grade 4 chondral changes medial femoral condyle to posterior horn medial meniscus tear grade 3 and 4 changes tibial plateau  ACL PCL normal  Lateral compartment degenerative tear posterior horn lateral meniscus grade 3 and 2 changes of the cartilaginous structures  Patellofemoral joint grade 2 and 3 changes of the trochlea and patella  The patient was seen in preop and the surgical site was confirmed chart was reviewed patient was taken to surgery with a mark on the left knee  After general anesthesia the leg was prepped and draped sterilely Timeout was completed  The scope was placed into the joint and the lateral portal diagnostic arthroscopy was completed.  Findings are listed above.  A medial portal was established and the leg was placed in figure-of-four position a shaver was placed into the joint and a debridement of the lateral meniscus posterior horn and anterior horn was performed.  We used a straight biter to trim the meniscus to a straight a stable rim confirming this with the probe  We then turned our attention to the medial hemijoint where we used a straight biter to debride and resect the torn posterior horn meniscal tear we remove the fragments of the meniscus with a shaver and balance the meniscus with the shaver until a stable rim was confirmed with the probe  We irrigated the joint injected Marcaine with epinephrine and closed the portals with 3-0 nylon  Sterile dressing was applied with Ace bandage and Cryo/Cuff which was activated  Patient was taken to the recovery room in stable condition SURGEON:  Surgeon(s) and  Role:    * Carole Civil, MD - Primary  PHYSICIAN ASSISTANT:   ASSISTANTS: none   ANESTHESIA:   general  EBL:  10 mL   BLOOD ADMINISTERED:none  DRAINS: none   LOCAL MEDICATIONS USED:  MARCAINE     SPECIMEN:  No Specimen  DISPOSITION OF SPECIMEN:  N/A  COUNTS:  YES  TOURNIQUET:  * Missing tourniquet times found for documented tourniquets in log: [id number] *  DICTATION: .Viviann Spare Dictation  PLAN OF CARE: Discharge to home after PACU  PATIENT DISPOSITION:  PACU - hemodynamically stable.   Delay start of Pharmacological VTE agent (>24hrs) due to surgical blood loss or risk of bleeding: no

## 2019-07-15 NOTE — Telephone Encounter (Signed)
[name] [name] called and stated that [name] [name] had surgery this morning.  When they went to get the pain medication filled at [facility], they were told the pharmacy was out of pain medication and would not get it in until tomorrow afternoon.  She wanted to know what she should give him instead tonight and tomorrow until they can get the prescription filled?  She asked about Tylenol or Advil.  I told her that I would have to get with Dr. Aline Brochure for him to advise  Please call [name] [name] back at [phone]

## 2019-07-15 NOTE — Transfer of Care (Signed)
Immediate Anesthesia Transfer of Care Note  Patient: Lashon D Stracke  Procedure(s) Performed: KNEE ARTHROSCOPY WITH MEDIAL MENISCECTOMY AND LATERAL MENISCECTOMY (Left Knee)  Patient Location: PACU  Anesthesia Type:General  Level of Consciousness: awake, alert  and oriented  Airway & Oxygen Therapy: Patient Spontanous Breathing and Patient connected to nasal cannula oxygen  Post-op Assessment: Report given to RN, Post -op Vital signs reviewed and stable and Patient moving all extremities X 4  Post vital signs: Reviewed and stable  Last Vitals:  Vitals Value Taken Time  BP 130/74 07/15/19 1145  Temp    Pulse 68 07/15/19 1147  Resp 16 07/15/19 1147  SpO2 93 % 07/15/19 1147  Vitals shown include unvalidated device data.  Last Pain:  Vitals:   07/15/19 0923  TempSrc: Oral  PainSc: 4       Patients Stated Pain Goal: 5 ([id number])  Complications: No apparent anesthesia complications

## 2019-07-15 NOTE — Discharge Instructions (Signed)
PATIENT INSTRUCTIONS POST-ANESTHESIA  IMMEDIATELY FOLLOWING SURGERY:  Do not drive or operate machinery for the first twenty four hours after surgery.  Do not make any important decisions for twenty four hours after surgery or while taking narcotic pain medications or sedatives.  If you develop intractable nausea and vomiting or a severe headache please notify your doctor immediately.  FOLLOW-UP:  Please make an appointment with your surgeon as instructed. You do not need to follow up with anesthesia unless specifically instructed to do so.  WOUND CARE INSTRUCTIONS (if applicable):  Keep a dry clean dressing on the anesthesia/puncture wound site if there is drainage.  Once the wound has quit draining you may leave it open to air.  Generally you should leave the bandage intact for twenty four hours unless there is drainage.  If the epidural site drains for more than 36-48 hours please call the anesthesia department.  QUESTIONS?:  Please feel free to call your physician or the hospital operator if you have any questions, and they will be happy to assist you.      Incision and Drainage, Care After This sheet gives you information about how to care for yourself after your procedure. Your health care provider may also give you more specific instructions. If you have problems or questions, contact your health care provider. What can I expect after the procedure? After the procedure, it is common to have:  Pain or discomfort around the incision site.  Blood, fluid, or pus (drainage) from the incision.  Redness and firm skin around the incision site. Follow these instructions at home: Medicines  Take over-the-counter and prescription medicines only as told by your health care provider.  If you were prescribed an antibiotic medicine, use or take it as told by your health care provider. Do not stop using the antibiotic even if you start to feel better. Wound care Follow instructions from your  health care provider about how to take care of your wound. Make sure you:  Wash your hands with soap and water before and after you change your bandage (dressing). If soap and water are not available, use hand sanitizer.  Change your dressing and packing as told by your health care provider. ? If your dressing is dry or stuck when you try to remove it, moisten or wet the dressing with saline or water so that it can be removed without harming your skin or tissues. ? If your wound is packed, leave it in place until your health care provider tells you to remove it. To remove the packing, moisten or wet the packing with saline or water so that it can be removed without harming your skin or tissues.  Leave stitches (sutures), skin glue, or adhesive strips in place. These skin closures may need to stay in place for 2 weeks or longer. If adhesive strip edges start to loosen and curl up, you may trim the loose edges. Do not remove adhesive strips completely unless your health care provider tells you to do that. Check your wound every day for signs of infection. Check for:  More redness, swelling, or pain.  More fluid or blood.  Warmth.  Pus or a bad smell. If you were sent home with a drain tube in place, follow instructions from your health care provider about:  How to empty it.  How to care for it at home.  General instructions  Rest the affected area.  Do not take baths, swim, or use a hot tub until your  health care provider approves. Ask your health care provider if you may take showers. You may only be allowed to take sponge baths.  Return to your normal activities as told by your health care provider. Ask your health care provider what activities are safe for you. Your health care provider may put you on activity or lifting restrictions.  The incision will continue to drain. It is normal to have some clear or slightly bloody drainage. The amount of drainage should lessen each day.  Do  not apply any creams, ointments, or liquids unless you have been told to by your health care provider.  Keep all follow-up visits as told by your health care provider. This is important. Contact a health care provider if:  Your cyst or abscess returns.  You have a fever or chills.  You have more redness, swelling, or pain around your incision.  You have more fluid or blood coming from your incision.  Your incision feels warm to the touch.  You have pus or a bad smell coming from your incision.  You have red streaks above or below the incision site. Get help right away if:  You have severe pain or bleeding.  You cannot eat or drink without vomiting.  You have decreased urine output.  You become short of breath.  You have chest pain.  You cough up blood.  The affected area becomes numb or starts to tingle. These symptoms may represent a serious problem that is an emergency. Do not wait to see if the symptoms will go away. Get medical help right away. Call your local emergency services (911 in the [place] Do not drive yourself to the hospital. Summary  After this procedure, it is common to have fluid, blood, or pus coming from the surgery site.  Follow all home care instructions. You will be told how to take care of your incision, how to check for infection, and how to take medicines.  If you were prescribed an antibiotic medicine, take it as told by your health care provider. Do not stop taking the antibiotic even if you start to feel better.  Contact a health care provider if you have increased redness, swelling, or pain around your incision. Get help right away if you have chest pain, you vomit, you cough up blood, or you have shortness of breath.  Keep all follow-up visits as told by your health care provider. This is important. This information is not intended to replace advice given to you by your health care provider. Make sure you discuss any questions you have with  your health care provider. Document Released: 10/23/2011 Document Revised: 07/01/2018 Document Reviewed: 07/01/2018 Elsevier Patient Education  2020 [employer].

## 2019-07-15 NOTE — Anesthesia Procedure Notes (Signed)
Procedure Name: LMA Insertion Date/Time: 07/15/2019 10:51 AM Performed by: Jonna Munro, CRNA Pre-anesthesia Checklist: Patient identified, Emergency Drugs available, Suction available, Patient being monitored and Timeout performed Patient Re-evaluated:Patient Re-evaluated prior to induction Oxygen Delivery Method: Circle system utilized Preoxygenation: Pre-oxygenation with 100% oxygen Induction Type: IV induction LMA: LMA inserted LMA Size: 5.0 Number of attempts: 1 Placement Confirmation: positive ETCO2 and breath sounds checked- equal and bilateral Tube secured with: Tape Dental Injury: Teeth and Oropharynx as per pre-operative assessment

## 2019-07-15 NOTE — Anesthesia Postprocedure Evaluation (Signed)
Anesthesia Post Note  Patient: [name] [name]  Procedure(s) Performed: KNEE ARTHROSCOPY WITH MEDIAL MENISCECTOMY AND LATERAL MENISCECTOMY (Left Knee)  Patient location during evaluation: PACU Anesthesia Type: General Level of consciousness: awake, oriented, awake and alert and patient cooperative Pain management: pain level controlled Vital Signs Assessment: post-procedure vital signs reviewed and stable Respiratory status: spontaneous breathing, respiratory function stable, nonlabored ventilation and patient connected to nasal cannula oxygen Cardiovascular status: stable Postop Assessment: no apparent nausea or vomiting Anesthetic complications: no     Last Vitals:  Vitals:   07/15/19 0923 07/15/19 1145  BP: (!) 158/81 130/74  Pulse: (!) 54 71  Resp: 16 12  Temp: 36.7 C 36.8 C  SpO2: 100% 94%    Last Pain:  Vitals:   07/15/19 0923  TempSrc: Oral  PainSc: 4                  Florentine Diekman

## 2019-07-15 NOTE — Op Note (Signed)
07/15/2019  12:08 PM  PATIENT:  [name] [name]  78 y.o. male  PRE-OPERATIVE DIAGNOSIS:  Left knee torn medial and lateral meniscus, ao   POST-OPERATIVE DIAGNOSIS:  Left knee torn medial and lateral meniscus, oa   PROCEDURE:  Procedure(s): KNEE ARTHROSCOPY WITH MEDIAL MENISCECTOMY AND LATERAL MENISCECTOMY [id number]  FINDINGS  Medial compartment: Grade 4 chondral changes medial femoral condyle to posterior horn medial meniscus tear grade 3 and 4 changes tibial plateau  ACL PCL normal  Lateral compartment degenerative tear posterior horn lateral meniscus grade 3 and 2 changes of the cartilaginous structures  Patellofemoral joint grade 2 and 3 changes of the trochlea and patella  The patient was seen in preop and the surgical site was confirmed chart was reviewed patient was taken to surgery with a mark on the left knee  After general anesthesia the leg was prepped and draped sterilely Timeout was completed  The scope was placed into the joint and the lateral portal diagnostic arthroscopy was completed.  Findings are listed above.  A medial portal was established and the leg was placed in figure-of-four position a shaver was placed into the joint and a debridement of the lateral meniscus posterior horn and anterior horn was performed.  We used a straight biter to trim the meniscus to a straight a stable rim confirming this with the probe  We then turned our attention to the medial hemijoint where we used a straight biter to debride and resect the torn posterior horn meniscal tear we remove the fragments of the meniscus with a shaver and balance the meniscus with the shaver until a stable rim was confirmed with the probe  We irrigated the joint injected Marcaine with epinephrine and closed the portals with 3-0 nylon  Sterile dressing was applied with Ace bandage and Cryo/Cuff which was activated  Patient was taken to the recovery room in stable condition SURGEON:  Surgeon(s) and  Role:    * Carole Civil, MD - Primary  PHYSICIAN ASSISTANT:   ASSISTANTS: none   ANESTHESIA:   general  EBL:  10 mL   BLOOD ADMINISTERED:none  DRAINS: none   LOCAL MEDICATIONS USED:  MARCAINE     SPECIMEN:  No Specimen  DISPOSITION OF SPECIMEN:  N/A  COUNTS:  YES  TOURNIQUET:  * Missing tourniquet times found for documented tourniquets in log: [id number] *  DICTATION: .Viviann Spare Dictation  PLAN OF CARE: Discharge to home after PACU  PATIENT DISPOSITION:  PACU - hemodynamically stable.   Delay start of Pharmacological VTE agent (>24hrs) due to surgical blood loss or risk of bleeding: no

## 2019-07-16 ENCOUNTER — Encounter (HOSPITAL_COMMUNITY): Payer: Self-pay | Admitting: Orthopedic Surgery

## 2019-07-16 DIAGNOSIS — E875 Hyperkalemia: Secondary | ICD-10-CM | POA: Diagnosis not present

## 2019-07-16 DIAGNOSIS — E039 Hypothyroidism, unspecified: Secondary | ICD-10-CM | POA: Diagnosis not present

## 2019-07-16 DIAGNOSIS — R7303 Prediabetes: Secondary | ICD-10-CM | POA: Diagnosis not present

## 2019-07-16 DIAGNOSIS — R7301 Impaired fasting glucose: Secondary | ICD-10-CM | POA: Diagnosis not present

## 2019-07-16 DIAGNOSIS — E782 Mixed hyperlipidemia: Secondary | ICD-10-CM | POA: Diagnosis not present

## 2019-07-16 DIAGNOSIS — I10 Essential (primary) hypertension: Secondary | ICD-10-CM | POA: Diagnosis not present

## 2019-07-16 DIAGNOSIS — N182 Chronic kidney disease, stage 2 (mild): Secondary | ICD-10-CM | POA: Diagnosis not present

## 2019-07-16 DIAGNOSIS — F5101 Primary insomnia: Secondary | ICD-10-CM | POA: Diagnosis not present

## 2019-07-16 DIAGNOSIS — I129 Hypertensive chronic kidney disease with stage 1 through stage 4 chronic kidney disease, or unspecified chronic kidney disease: Secondary | ICD-10-CM | POA: Diagnosis not present

## 2019-07-19 DIAGNOSIS — G4733 Obstructive sleep apnea (adult) (pediatric): Secondary | ICD-10-CM | POA: Diagnosis not present

## 2019-07-24 ENCOUNTER — Encounter (INDEPENDENT_AMBULATORY_CARE_PROVIDER_SITE_OTHER): Payer: Self-pay | Admitting: Otolaryngology

## 2019-07-24 ENCOUNTER — Other Ambulatory Visit: Payer: Self-pay

## 2019-07-24 ENCOUNTER — Ambulatory Visit (INDEPENDENT_AMBULATORY_CARE_PROVIDER_SITE_OTHER): Payer: Medicare HMO | Admitting: Otolaryngology

## 2019-07-24 VITALS — Temp 98.2°F

## 2019-07-24 DIAGNOSIS — H6123 Impacted cerumen, bilateral: Secondary | ICD-10-CM

## 2019-07-24 NOTE — Progress Notes (Signed)
HPI: [name] [name] is a 78 y.o. male who presents for evaluation of ear wax.  He had a previous right myringotomy tube placed by myself a year and a half ago.  He has had a longstanding hearing tube in the left ear.  Presently his left ear is more blocked than his right..  Past Medical History:  Diagnosis Date  . Arthritis   . CAD (coronary artery disease)   . CAD (coronary artery disease) 12/04/2011   Left Main Disease with 3- vessel CAD   . CAD (coronary artery disease)    seeing Dr Harl Bowie  . CHF (congestive heart failure) ([place])   . Deviated septum   . Dyslipidemia   . GERD (gastroesophageal reflux disease)   . Heart attack ([place])   . Hyperlipidemia   . Hypertension   . Hypothyroidism   . MI (myocardial infarction) ([place]) 1997  . Neuropathy   . Obesity (BMI 30-39.9) 09/02/2018  . PONV (postoperative nausea and vomiting)    has in past, no problems after most recent surgery  . 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 S/P carotid endarterectomy 02/29/2012   Right CEA for asymptomatic severe right ICA stenosis   . 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 Shortness of breath    with exertion  . Sleep apnea    CPAP, sleep study at [facility]  . 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   80-99% stenosis RICA, asymptomatic  . Thyroid nodule   . Wears glasses    or contacts   Past Surgical History:  Procedure Laterality Date  . ADENOIDECTOMY    . CARDIAC CATHETERIZATION     02/22/12  . CARDIOVASCULAR STRESS TEST  7/13  . CAROTID ENDARTERECTOMY    . COLONOSCOPY  15-20years and 12/04/11   Done at [place]  12/11/2011   Procedure: COLONOSCOPY;  Surgeon: Rogene Houston, MD;  [facility];  Service: Endoscopy;  Laterality: N/A;  830  . CORONARY ARTERY BYPASS GRAFT  02/29/2012   Procedure: CORONARY ARTERY BYPASS GRAFTING (CABG);  Surgeon: Rexene Alberts, MD;  Location: [place];   Service: Open Heart Surgery;  Laterality: N/A;  times three using Left Internal Mammary Artery and Right Greater Saphenous Vein Graft Harvested Endoscopically  . CORONARY STENT PLACEMENT    . ENDARTERECTOMY  02/29/2012   Procedure: ENDARTERECTOMY CAROTID;  Surgeon: Angelia Mould, MD;  Location: [place];  Service: Vascular;  Laterality: Right;  . FOOT SURGERY  06/30/11   artificial joint in big toe right foot   . KNEE ARTHROSCOPY WITH MEDIAL MENISECTOMY Left 07/15/2019   Procedure: KNEE ARTHROSCOPY WITH MEDIAL MENISCECTOMY AND LATERAL MENISCECTOMY;  Surgeon: Carole Civil, MD;  Location: [facility];  Service: Orthopedics;  Laterality: Left;  . LEFT HEART CATHETERIZATION WITH CORONARY ANGIOGRAM N/A 02/20/2012   Procedure: LEFT HEART CATHETERIZATION WITH CORONARY ANGIOGRAM;  Surgeon: Troy Sine, MD;  Location: [facility] CATH LAB;  Service: Cardiovascular;  Laterality: N/A;  . NASAL SEPTUM SURGERY    . Totol thyroidectomy  02/06/2011  . TRANSESOPHAGEAL ECHOCARDIOGRAM    . UPPER GASTROINTESTINAL ENDOSCOPY  35-40 years ago   [place] History   Socioeconomic History  . Marital status: Married    Spouse name: Velva Harman  . Number of children: 1  . Years of education: 43  . Highest education level:  Not on file  Occupational History    Comment: retired  Tobacco Use  . Smoking status: Former Smoker    Packs/day: 0.50    Years: 15.00    Pack years: 7.50    Types: Cigarettes    Quit date: 09/04/2001    Years since quitting: 17.8  . Smokeless tobacco: Never Used  . Tobacco comment: smoked 42yrs, smoked 1/2 pack a day  Substance and Sexual Activity  . Alcohol use: Not Currently    Alcohol/week: 3.0 - 4.0 standard drinks    Types: 3 - 4 Cans of beer per week    Comment: social- beer , not on regular basis  . Drug use: No  . Sexual activity: Yes  Other Topics Concern  . Not on file  Social History Narrative   Consumes 2-3 cups of caffeine daily   Social Determinants of Health    Financial Resource Strain:   . Difficulty of Paying Living Expenses: Not on file  Food Insecurity:   . Worried About Charity fundraiser in the Last Year: Not on file  . Ran Out of Food in the Last Year: Not on file  Transportation Needs:   . Lack of Transportation (Medical): Not on file  . Lack of Transportation (Non-Medical): Not on file  Physical Activity:   . Days of Exercise per Week: Not on file  . Minutes of Exercise per Session: Not on file  Stress:   . Feeling of Stress : Not on file  Social Connections:   . Frequency of Communication with Friends and Family: Not on file  . Frequency of Social Gatherings with Friends and Family: Not on file  . Attends Religious Services: Not on file  . Active Member of Clubs or Organizations: Not on file  . Attends Archivist Meetings: Not on file  . Marital Status: Not on file   Family History  Problem Relation Age of Onset  . Hypertension Mother   . Healthy Sister   . Healthy Brother   . Healthy Son   . Heart attack Father   . Heart disease Father        Before age 21  . Hyperlipidemia Father   . Hypertension Father   . Prostate cancer Father   . Colon cancer Neg Hx    No Known Allergies Prior to Admission medications   Medication Sig Start Date End Date Taking? Authorizing Provider  aspirin EC 81 MG tablet Take 81 mg by mouth daily.   Yes [provider]  atorvastatin (LIPITOR) 40 MG tablet TAKE 1 TABLET EVERY DAY Patient taking differently: Take 40 mg by mouth daily.  06/24/19  Yes Branch, Alphonse Guild, MD  diclofenac (VOLTAREN) 75 MG EC tablet Take 1 tablet (75 mg total) by mouth 2 (two) times daily. 05/02/19  Yes Carole Civil, MD  Esomeprazole Magnesium (NEXIUM PO) Take 20 mg by mouth daily.  08/19/14  Yes [provider]  metoprolol (LOPRESSOR) 50 MG tablet Take 1 tablet (50 mg total) by mouth 2 (two) times daily. 09/23/13  Yes BranchAlphonse Guild, MD  Multiple Vitamins-Minerals (PRESERVISION  AREDS 2 PO) Take 1 tablet by mouth 2 (two) times daily.    Yes [provider]  Omega-3 Fatty Acids (FISH OIL) 1000 MG CPDR Take 1,000 mg by mouth 2 (two) times daily.    Yes [provider]  promethazine (PHENERGAN) 12.5 MG tablet Take 1 tablet (12.5 mg total) by mouth every 6 (six) hours as  needed for nausea or vomiting. 07/15/19  Yes Carole Civil, MD  Psyllium (METAMUCIL PO) Take 3 capsules by mouth daily.    Yes [provider]  ramipril (ALTACE) 10 MG capsule Take 10 mg by mouth daily.  03/03/13  Yes [provider]  SYNTHROID 112 MCG tablet Take 112 mcg by mouth daily before breakfast.  08/15/18  Yes [provider]  zolpidem (AMBIEN) 10 MG tablet Take 10 mg by mouth at bedtime as needed for sleep.   Yes [provider]     Positive ROS: Negative  All other systems have been reviewed and were otherwise negative with the exception of those mentioned in the HPI and as above.  Physical Exam: Constitutional: Alert, well-appearing, no acute distress Ears: External ears without lesions or tenderness. Ear canals he has a small amount of wax in the right ear canal the right Paparella type I tube is extruded lying adjacent to the TM and this was removed.  The right TM is clear.  Left ear canal had a large amount of cerumen completely obstructing the ear canal this was cleaned with suction.  The myringotomy tube is still patent without active drainage.. Nasal: External nose without lesions. Clear nasal passages Oral: Oropharynx clear. Neck: No palpable adenopathy or masses Respiratory: Breathing comfortably  Skin: No facial/neck lesions or rash noted.  Cerumen impaction removal  Date/Time: 07/24/2019 2:33 PM Performed by: Rozetta Nunnery, MD Authorized by: Rozetta Nunnery, MD   Consent:    Consent obtained:  Verbal   Consent given by:  Patient   Risks discussed:  Pain and bleeding Procedure details:    Location:  L ear  and R ear   Procedure type: suction and forceps   Post-procedure details:    Inspection:  Canal normal (Left myringotomy tube with permanent tube in place.  Right Paparella tube extruded and was removed.  TM is intact.)   Hearing quality:  Improved   Patient tolerance of procedure:  Tolerated well, no immediate complications    Assessment: Cerumen impactions Extruded right Paparella type I tube  Plan: He will follow-up as needed  Radene Journey, MD

## 2019-07-31 DIAGNOSIS — Z9889 Other specified postprocedural states: Secondary | ICD-10-CM | POA: Insufficient documentation

## 2019-08-01 ENCOUNTER — Other Ambulatory Visit: Payer: Self-pay

## 2019-08-01 ENCOUNTER — Ambulatory Visit (INDEPENDENT_AMBULATORY_CARE_PROVIDER_SITE_OTHER): Payer: Medicare HMO | Admitting: Orthopedic Surgery

## 2019-08-01 ENCOUNTER — Encounter: Payer: Self-pay | Admitting: Orthopedic Surgery

## 2019-08-01 DIAGNOSIS — Z9889 Other specified postprocedural states: Secondary | ICD-10-CM

## 2019-08-01 NOTE — Progress Notes (Signed)
Postop left knee arthroscopy postop visit number 1 year a torn medial meniscus lateral meniscus grade 4 chondral changes medial femoral condyle and then grade 3 and 4 changes on the tibial plateau medially  He is doing well he is doing 50 knee bends 3 times a day he still has some swelling.  Sutures were removed  Recommend straight leg raises quad sets and terminal knee extensions continue knee bends ice 3 times a day follow-up January 15  Encounter Diagnosis  Name Primary?  . S/P left knee arthroscopy 07/15/19 Yes

## 2019-08-15 ENCOUNTER — Other Ambulatory Visit: Payer: Self-pay | Admitting: Cardiology

## 2019-08-19 DIAGNOSIS — G4733 Obstructive sleep apnea (adult) (pediatric): Secondary | ICD-10-CM | POA: Diagnosis not present

## 2019-08-28 ENCOUNTER — Telehealth (INDEPENDENT_AMBULATORY_CARE_PROVIDER_SITE_OTHER): Payer: Medicare HMO | Admitting: Cardiology

## 2019-08-28 ENCOUNTER — Other Ambulatory Visit: Payer: Self-pay

## 2019-08-28 VITALS — Ht 73.0 in | Wt 215.0 lb

## 2019-08-28 DIAGNOSIS — E669 Obesity, unspecified: Secondary | ICD-10-CM

## 2019-08-28 DIAGNOSIS — G4733 Obstructive sleep apnea (adult) (pediatric): Secondary | ICD-10-CM

## 2019-08-28 DIAGNOSIS — I10 Essential (primary) hypertension: Secondary | ICD-10-CM | POA: Diagnosis not present

## 2019-08-28 NOTE — Progress Notes (Signed)
Virtual Visit via Telephone Note   This visit type was conducted due to national recommendations for restrictions regarding the COVID-19 Pandemic (e.g. social distancing) in an effort to limit this patient's exposure and mitigate transmission in our community.  Due to his co-morbid illnesses, this patient is at least at moderate risk for complications without adequate follow up.  This format is felt to be most appropriate for this patient at this time.  The patient did not have access to video technology/had technical difficulties with video requiring transitioning to audio format only (telephone).  All issues noted in this document were discussed and addressed.  No physical exam could be performed with this format.  Please refer to the patient's chart for his  consent to telehealth for [facility].  Evaluation Performed:  Follow-up visit  This visit type was conducted due to national recommendations for restrictions regarding the COVID-19 Pandemic (e.g. social distancing).  This format is felt to be most appropriate for this patient at this time.  All issues noted in this document were discussed and addressed.  No physical exam was performed (except for noted visual exam findings with Video Visits).  Please refer to the patient's chart (MyChart message for video visits and phone note for telephone visits) for the patient's consent to telehealth for [facility].  Date:  08/28/2019   ID:  DIANA GERSHON, DOB 29-Jan-1941, MRN [id number]  Patient Location:  Home  Provider location:   [place]  PCP:  Celene Squibb, MD  Sleep Medicine:  Fransico Him, MD  Electrophysiologist:  None   Chief Complaint:  OSA  History of Present Illness:    [name] [name] is a 79 y.o. male who presents via audio/video conferencing for a telehealth visit today.  OSA  [name] [name] is a 79 y.o. male with a hx of ? OSA on CPAP.  He says that he had a sleep study done at [facility] in  2012 and has been on CPAP therapy since then.  His last sleep study was in 2016 which was done at [facility] neurologic because he was having problems sleeping because of neuropathy.  He thought that he used his sleep device during that sleep study but according to the report he was not using his sleep device and it was done on room air and showed an overall normal AHI of 3.7/h with moderate oxygen desaturations when sleeping supine.  When I last saw him he wanted to get a new machine.  He underwent repeat CPAP titration since he did not know what pressure he was on so we repeated a CPAP titration to 10cm H2O.   He is doing well with his CPAP device and thinks that he has gotten used to it.  He tolerates the mask and feels the pressure is adequate.  Since going on CPAP he feels rested in the am and has no significant daytime sleepiness.  He denies any significant mouth or nasal dryness or nasal congestion.  He does not think that he snores.     The patient does not have symptoms concerning for COVID-19 infection (fever, chills, cough, or new shortness of breath).    Prior CV studies:   The following studies were reviewed today:  PAP compliance download  Past Medical History:  Diagnosis Date  . Arthritis   . CAD (coronary artery disease)   . CAD (coronary artery disease) 12/04/2011   Left Main Disease with 3- vessel CAD   . CAD (  coronary artery disease)    seeing Dr Harl Bowie  . CHF (congestive heart failure) ([place])   . Deviated septum   . Dyslipidemia   . GERD (gastroesophageal reflux disease)   . Heart attack ([place])   . Hyperlipidemia   . Hypertension   . Hypothyroidism   . MI (myocardial infarction) ([place]) 1997  . Neuropathy   . Obesity (BMI 30-39.9) 09/02/2018  . PONV (postoperative nausea and vomiting)    has in past, no problems after most recent surgery  . 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 S/P carotid endarterectomy 02/29/2012    Right CEA for asymptomatic severe right ICA stenosis   . 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 Shortness of breath    with exertion  . Sleep apnea    CPAP, sleep study at [facility]  . 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   80-99% stenosis RICA, asymptomatic  . Thyroid nodule   . Wears glasses    or contacts   Past Surgical History:  Procedure Laterality Date  . ADENOIDECTOMY    . CARDIAC CATHETERIZATION     02/22/12  . CARDIOVASCULAR STRESS TEST  7/13  . CAROTID ENDARTERECTOMY    . COLONOSCOPY  15-20years and 12/04/11   Done at [place]  12/11/2011   Procedure: COLONOSCOPY;  Surgeon: Rogene Houston, MD;  [facility];  Service: Endoscopy;  Laterality: N/A;  830  . CORONARY ARTERY BYPASS GRAFT  02/29/2012   Procedure: CORONARY ARTERY BYPASS GRAFTING (CABG);  Surgeon: Rexene Alberts, MD;  Location: [place];  Service: Open Heart Surgery;  Laterality: N/A;  times three using Left Internal Mammary Artery and Right Greater Saphenous Vein Graft Harvested Endoscopically  . CORONARY STENT PLACEMENT    . ENDARTERECTOMY  02/29/2012   Procedure: ENDARTERECTOMY CAROTID;  Surgeon: Angelia Mould, MD;  Location: [place];  Service: Vascular;  Laterality: Right;  . FOOT SURGERY  06/30/11   artificial joint in big toe right foot   . KNEE ARTHROSCOPY WITH MEDIAL MENISECTOMY Left 07/15/2019   Procedure: KNEE ARTHROSCOPY WITH MEDIAL MENISCECTOMY AND LATERAL MENISCECTOMY;  Surgeon: Carole Civil, MD;  Location: [facility];  Service: Orthopedics;  Laterality: Left;  . LEFT HEART CATHETERIZATION WITH CORONARY ANGIOGRAM N/A 02/20/2012   Procedure: LEFT HEART CATHETERIZATION WITH CORONARY ANGIOGRAM;  Surgeon: Troy Sine, MD;  Location: [facility] CATH LAB;  Service: Cardiovascular;  Laterality: N/A;  . NASAL SEPTUM SURGERY    . Totol thyroidectomy  02/06/2011  . TRANSESOPHAGEAL ECHOCARDIOGRAM    . UPPER GASTROINTESTINAL ENDOSCOPY  35-40  years ago   [facility]     Current Meds  Medication Sig  . aspirin EC 81 MG tablet Take 81 mg by mouth daily.  Marland Kitchen atorvastatin (LIPITOR) 40 MG tablet TAKE 1 TABLET EVERY DAY (NEED APPOINTMENT FOR FURTHER REFILLS)  . diclofenac (VOLTAREN) 75 MG EC tablet Take 1 tablet (75 mg total) by mouth 2 (two) times daily.  . Esomeprazole Magnesium (NEXIUM PO) Take 20 mg by mouth daily.   . metoprolol (LOPRESSOR) 50 MG tablet Take 1 tablet (50 mg total) by mouth 2 (two) times daily.  . Multiple Vitamins-Minerals (PRESERVISION AREDS 2 PO) Take 1 tablet by mouth 2 (two) times daily.   . Omega-3 Fatty Acids (FISH OIL) 1000 MG CPDR Take 1,000 mg by mouth 2 (two) times daily.   Marland Kitchen  promethazine (PHENERGAN) 12.5 MG tablet Take 1 tablet (12.5 mg total) by mouth every 6 (six) hours as needed for nausea or vomiting.  . Psyllium (METAMUCIL PO) Take 3 capsules by mouth daily.   . ramipril (ALTACE) 10 MG capsule Take 10 mg by mouth daily.   Marland Kitchen SYNTHROID 112 MCG tablet Take 112 mcg by mouth daily before breakfast.   . zolpidem (AMBIEN) 10 MG tablet Take 10 mg by mouth at bedtime as needed for sleep.     Allergies:   Patient has no known allergies.   Social History   Tobacco Use  . Smoking status: Former Smoker    Packs/day: 0.50    Years: 15.00    Pack years: 7.50    Types: Cigarettes    Quit date: 09/04/2001    Years since quitting: 17.9  . Smokeless tobacco: Never Used  . Tobacco comment: smoked 9yrs, smoked 1/2 pack a day  Substance Use Topics  . Alcohol use: Not Currently    Alcohol/week: 3.0 - 4.0 standard drinks    Types: 3 - 4 Cans of beer per week    Comment: social- beer , not on regular basis  . Drug use: No     Family Hx: The patient's family history includes Healthy in his brother, sister, and son; Heart attack in his father; Heart disease in his father; Hyperlipidemia in his father; Hypertension in his father and mother; Prostate cancer in his father. There is no history of Colon cancer.   ROS:   Please see the history of present illness.     All other systems reviewed and are negative.   Labs/Other Tests and Data Reviewed:    Recent Labs: 07/09/2019: BUN 20; Creatinine, Ser 1.05; Hemoglobin 14.4; Platelets 181; Potassium 4.5; Sodium 136   Recent Lipid Panel Lab Results  Component Value Date/Time   CHOL 125 03/19/2013 09:02 AM   TRIG 108 03/19/2013 09:02 AM   HDL 41 03/19/2013 09:02 AM   CHOLHDL 3.0 03/19/2013 09:02 AM   LDLCALC 62 03/19/2013 09:02 AM    Wt Readings from Last 3 Encounters:  08/28/19 215 lb (97.5 kg)  07/09/19 208 lb (94.3 kg)  07/02/19 212 lb (96.2 kg)     Objective:    Vital Signs:  Ht 6\' 1"  (1.854 m)   Wt 215 lb (97.5 kg)   BMI 28.37 kg/m     ASSESSMENT & PLAN:    1.  OSA -The patient is tolerating PAP therapy well without any problems. The PAP download was reviewed today and showed an AHI of 1.7/hr on 10 cm H2O with 100% compliance in using more than 4 hours nightly.  The patient has been using and benefiting from PAP use and will continue to benefit from therapy.   2 . HTN -BP controlled -continue Lopressor 50mg  BID and Ramipril 10mg  daily -creatinine is stable at 1.05 in Nov 2020  3.  Obesity -I have encouraged him to get into a routine exercise program and cut back on carbs and portions.    COVID-19 Education: The signs and symptoms of COVID-19 were discussed with the patient and how to seek care for testing (follow up with PCP or arrange E-visit).  The importance of social distancing was discussed today.  Patient Risk:   After full review of this patient's clinical status, I feel that they are at least moderate risk at this time.  Time:   Today, I have spent 20 minutes on telemedicine discussing medical problems including OSA, HTN,  obesity.  We also reviewed the symptoms of COVID 19 and the ways to protect against contracting the virus with telehealth technology.  I spent an additional 5 minutes reviewing patient's chart  including PAP compliance download.  Medication Adjustments/Labs and Tests Ordered: Current medicines are reviewed at length with the patient today.  Concerns regarding medicines are outlined above.  Tests Ordered: No orders of the defined types were placed in this encounter.  Medication Changes: No orders of the defined types were placed in this encounter.   Disposition:  Follow up in 1 year(s)  Signed, Fransico Him, MD  08/28/2019 2:14 PM     [facility]

## 2019-08-29 ENCOUNTER — Other Ambulatory Visit: Payer: Self-pay

## 2019-08-29 ENCOUNTER — Ambulatory Visit (INDEPENDENT_AMBULATORY_CARE_PROVIDER_SITE_OTHER): Payer: Medicare HMO | Admitting: Orthopedic Surgery

## 2019-08-29 VITALS — BP 130/76 | HR 77 | Temp 97.0°F | Ht 73.0 in | Wt 212.0 lb

## 2019-08-29 DIAGNOSIS — Z9889 Other specified postprocedural states: Secondary | ICD-10-CM

## 2019-08-29 NOTE — Progress Notes (Signed)
Postop visit  Chief Complaint  Patient presents with  . Follow-up    Recheck on left knee, DOS 07-15-19.    Left knee arthroscopy on December 1 patient is doing well he had a torn medial meniscus torn lateral meniscus grade 4 chondral changes on the medial femoral condyle and then 3 and 4 changes on the tibial plateau  He is doing well he is walking without support he says he has a little bit of weakness in the knee but not much and we shared that he can get a knee sleeve for 4 to 6 weeks  Otherwise activities as tolerated  He has no swelling full range of motion no real pain in the knee joint at this time and we released him to follow-up on an as-needed basis  Encounter Diagnosis  Name Primary?  . S/P left knee arthroscopy 07/15/19 Yes

## 2019-09-01 DIAGNOSIS — B354 Tinea corporis: Secondary | ICD-10-CM | POA: Diagnosis not present

## 2019-09-05 DIAGNOSIS — G4733 Obstructive sleep apnea (adult) (pediatric): Secondary | ICD-10-CM | POA: Diagnosis not present

## 2019-09-09 DIAGNOSIS — R7303 Prediabetes: Secondary | ICD-10-CM | POA: Diagnosis not present

## 2019-09-09 DIAGNOSIS — I129 Hypertensive chronic kidney disease with stage 1 through stage 4 chronic kidney disease, or unspecified chronic kidney disease: Secondary | ICD-10-CM | POA: Diagnosis not present

## 2019-09-09 DIAGNOSIS — E039 Hypothyroidism, unspecified: Secondary | ICD-10-CM | POA: Diagnosis not present

## 2019-09-09 DIAGNOSIS — E782 Mixed hyperlipidemia: Secondary | ICD-10-CM | POA: Diagnosis not present

## 2019-09-09 DIAGNOSIS — F5101 Primary insomnia: Secondary | ICD-10-CM | POA: Diagnosis not present

## 2019-09-09 DIAGNOSIS — N182 Chronic kidney disease, stage 2 (mild): Secondary | ICD-10-CM | POA: Diagnosis not present

## 2019-09-09 DIAGNOSIS — R7301 Impaired fasting glucose: Secondary | ICD-10-CM | POA: Diagnosis not present

## 2019-09-09 DIAGNOSIS — E875 Hyperkalemia: Secondary | ICD-10-CM | POA: Diagnosis not present

## 2019-09-09 DIAGNOSIS — I10 Essential (primary) hypertension: Secondary | ICD-10-CM | POA: Diagnosis not present

## 2019-09-19 DIAGNOSIS — G4733 Obstructive sleep apnea (adult) (pediatric): Secondary | ICD-10-CM | POA: Diagnosis not present

## 2019-09-23 DIAGNOSIS — E782 Mixed hyperlipidemia: Secondary | ICD-10-CM | POA: Diagnosis not present

## 2019-09-23 DIAGNOSIS — R972 Elevated prostate specific antigen [PSA]: Secondary | ICD-10-CM | POA: Diagnosis not present

## 2019-09-23 DIAGNOSIS — N182 Chronic kidney disease, stage 2 (mild): Secondary | ICD-10-CM | POA: Diagnosis not present

## 2019-09-23 DIAGNOSIS — N183 Chronic kidney disease, stage 3 unspecified: Secondary | ICD-10-CM | POA: Diagnosis not present

## 2019-09-23 DIAGNOSIS — I10 Essential (primary) hypertension: Secondary | ICD-10-CM | POA: Diagnosis not present

## 2019-09-23 DIAGNOSIS — E039 Hypothyroidism, unspecified: Secondary | ICD-10-CM | POA: Diagnosis not present

## 2019-09-23 DIAGNOSIS — G2581 Restless legs syndrome: Secondary | ICD-10-CM | POA: Diagnosis not present

## 2019-09-23 DIAGNOSIS — R7301 Impaired fasting glucose: Secondary | ICD-10-CM | POA: Diagnosis not present

## 2019-09-23 DIAGNOSIS — R7303 Prediabetes: Secondary | ICD-10-CM | POA: Diagnosis not present

## 2019-09-26 DIAGNOSIS — I2510 Atherosclerotic heart disease of native coronary artery without angina pectoris: Secondary | ICD-10-CM | POA: Diagnosis not present

## 2019-09-26 DIAGNOSIS — G4733 Obstructive sleep apnea (adult) (pediatric): Secondary | ICD-10-CM | POA: Diagnosis not present

## 2019-09-26 DIAGNOSIS — I129 Hypertensive chronic kidney disease with stage 1 through stage 4 chronic kidney disease, or unspecified chronic kidney disease: Secondary | ICD-10-CM | POA: Diagnosis not present

## 2019-09-26 DIAGNOSIS — E039 Hypothyroidism, unspecified: Secondary | ICD-10-CM | POA: Diagnosis not present

## 2019-09-26 DIAGNOSIS — M25562 Pain in left knee: Secondary | ICD-10-CM | POA: Diagnosis not present

## 2019-09-26 DIAGNOSIS — E875 Hyperkalemia: Secondary | ICD-10-CM | POA: Diagnosis not present

## 2019-09-26 DIAGNOSIS — G4700 Insomnia, unspecified: Secondary | ICD-10-CM | POA: Diagnosis not present

## 2019-09-26 DIAGNOSIS — E782 Mixed hyperlipidemia: Secondary | ICD-10-CM | POA: Diagnosis not present

## 2019-09-26 DIAGNOSIS — R7301 Impaired fasting glucose: Secondary | ICD-10-CM | POA: Diagnosis not present

## 2019-10-06 DIAGNOSIS — Z961 Presence of intraocular lens: Secondary | ICD-10-CM | POA: Diagnosis not present

## 2019-10-06 DIAGNOSIS — H353131 Nonexudative age-related macular degeneration, bilateral, early dry stage: Secondary | ICD-10-CM | POA: Diagnosis not present

## 2019-10-06 DIAGNOSIS — H40013 Open angle with borderline findings, low risk, bilateral: Secondary | ICD-10-CM | POA: Diagnosis not present

## 2019-10-06 DIAGNOSIS — H33103 Unspecified retinoschisis, bilateral: Secondary | ICD-10-CM | POA: Diagnosis not present

## 2019-10-06 DIAGNOSIS — H524 Presbyopia: Secondary | ICD-10-CM | POA: Diagnosis not present

## 2019-10-17 DIAGNOSIS — G4733 Obstructive sleep apnea (adult) (pediatric): Secondary | ICD-10-CM | POA: Diagnosis not present

## 2019-10-22 ENCOUNTER — Other Ambulatory Visit: Payer: Self-pay | Admitting: Orthopedic Surgery

## 2019-10-22 DIAGNOSIS — G8929 Other chronic pain: Secondary | ICD-10-CM

## 2019-10-23 ENCOUNTER — Telehealth (HOSPITAL_COMMUNITY): Payer: Self-pay

## 2019-10-23 ENCOUNTER — Other Ambulatory Visit: Payer: Self-pay | Admitting: *Deleted

## 2019-10-23 DIAGNOSIS — I6523 Occlusion and stenosis of bilateral carotid arteries: Secondary | ICD-10-CM

## 2019-10-23 NOTE — Telephone Encounter (Signed)

## 2019-10-24 ENCOUNTER — Ambulatory Visit (HOSPITAL_COMMUNITY)
Admission: RE | Admit: 2019-10-24 | Discharge: 2019-10-24 | Disposition: A | Discharge status: Home / Self Care | Payer: Medicare HMO | Source: Ambulatory Visit | Attending: Vascular Surgery | Admitting: Vascular Surgery

## 2019-10-24 ENCOUNTER — Ambulatory Visit (INDEPENDENT_AMBULATORY_CARE_PROVIDER_SITE_OTHER): Payer: Medicare HMO | Admitting: Physician Assistant

## 2019-10-24 ENCOUNTER — Other Ambulatory Visit: Payer: Self-pay

## 2019-10-24 VITALS — BP 140/85 | HR 54 | Temp 98.1°F | Resp 16 | Ht 73.0 in | Wt 212.0 lb

## 2019-10-24 DIAGNOSIS — I6523 Occlusion and stenosis of bilateral carotid arteries: Secondary | ICD-10-CM

## 2019-10-24 DIAGNOSIS — I509 Heart failure, unspecified: Secondary | ICD-10-CM | POA: Diagnosis not present

## 2019-10-24 NOTE — Progress Notes (Signed)
Office Note     CC:  follow up Requesting Provider:  Celene Squibb, MD  HPI: [name] [name] is a 79 y.o. (05/19/1941) male who presents for routine follow-up of greater than 80% right carotid stenosis. Preoperative cardiac evaluation showed evidence of significant coronary disease. He underwent combined right carotid endarterectomy and coronary revascularization on 02/29/2012 by Dr. Scot Dock and Dr. Roxy Manns.   Today, he denies claudication or rest pain.  He denies temporary monocular blindness, weakness of either upper or lower extremities, slurred speech.  He has a history of peripheral neuropathy and complaints of burning of the soles of his feet.  The pt is on a statin for cholesterol management.  The pt is on a daily aspirin.   Other AC:  none The pt is on ACEI for hypertension.   The pt is not diabetic.   Tobacco hx:  Former cigs; quit 2003  Hx of CAD s/p stents  Past Medical History:  Diagnosis Date  . Arthritis   . CAD (coronary artery disease)   . CAD (coronary artery disease) 12/04/2011   Left Main Disease with 3- vessel CAD   . CAD (coronary artery disease)    seeing Dr Harl Bowie  . CHF (congestive heart failure) ([place])   . Deviated septum   . Dyslipidemia   . GERD (gastroesophageal reflux disease)   . Heart attack ([place])   . Hyperlipidemia   . Hypertension   . Hypothyroidism   . MI (myocardial infarction) ([place]) 1997  . Neuropathy   . Obesity (BMI 30-39.9) 09/02/2018  . PONV (postoperative nausea and vomiting)    has in past, no problems after most recent surgery  . 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 S/P carotid endarterectomy 02/29/2012   Right CEA for asymptomatic severe right ICA stenosis   . 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 Shortness of breath    with exertion  . Sleep apnea    CPAP, sleep study at [facility]  . 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   80-99% stenosis RICA,  asymptomatic  . Thyroid nodule   . Wears glasses    or contacts    Past Surgical History:  Procedure Laterality Date  . ADENOIDECTOMY    . CARDIAC CATHETERIZATION     02/22/12  . CARDIOVASCULAR STRESS TEST  7/13  . CAROTID ENDARTERECTOMY    . COLONOSCOPY  15-20years and 12/04/11   Done at [place]  12/11/2011   Procedure: COLONOSCOPY;  Surgeon: Rogene Houston, MD;  [facility];  Service: Endoscopy;  Laterality: N/A;  830  . CORONARY ARTERY BYPASS GRAFT  02/29/2012   Procedure: CORONARY ARTERY BYPASS GRAFTING (CABG);  Surgeon: Rexene Alberts, MD;  Location: [place];  Service: Open Heart Surgery;  Laterality: N/A;  times three using Left Internal Mammary Artery and Right Greater Saphenous Vein Graft Harvested Endoscopically  . CORONARY STENT PLACEMENT    . ENDARTERECTOMY  02/29/2012   Procedure: ENDARTERECTOMY CAROTID;  Surgeon: Angelia Mould, MD;  Location: [facility];  Service: Vascular;  Laterality: Right;  . FOOT SURGERY  06/30/11   artificial joint in big toe right foot   . KNEE ARTHROSCOPY WITH MEDIAL MENISECTOMY Left 07/15/2019   Procedure: KNEE ARTHROSCOPY WITH MEDIAL MENISCECTOMY AND LATERAL MENISCECTOMY;  Surgeon: Carole Civil, MD;  Location: [facility];  Service: Orthopedics;  Laterality: Left;  . LEFT HEART CATHETERIZATION WITH CORONARY ANGIOGRAM N/A 02/20/2012   Procedure: LEFT HEART CATHETERIZATION WITH CORONARY ANGIOGRAM;  Surgeon: Troy Sine, MD;  Location: [facility] CATH LAB;  Service: Cardiovascular;  Laterality: N/A;  . NASAL SEPTUM SURGERY    . Totol thyroidectomy  02/06/2011  . TRANSESOPHAGEAL ECHOCARDIOGRAM    . UPPER GASTROINTESTINAL ENDOSCOPY  35-40 years ago   [place] History   Socioeconomic History  . Marital status: Married    Spouse name: Velva Harman  . Number of children: 1  . Years of education: 37  . Highest education level: Not on file  Occupational History    Comment: retired  Tobacco Use  . Smoking  status: Former Smoker    Packs/day: 0.50    Years: 15.00    Pack years: 7.50    Types: Cigarettes    Quit date: 09/04/2001    Years since quitting: 18.1  . Smokeless tobacco: Never Used  . Tobacco comment: smoked 54yrs, smoked 1/2 pack a day  Substance and Sexual Activity  . Alcohol use: Not Currently    Alcohol/week: 3.0 - 4.0 standard drinks    Types: 3 - 4 Cans of beer per week    Comment: social- beer , not on regular basis  . Drug use: No  . Sexual activity: Yes  Other Topics Concern  . Not on file  Social History Narrative   Consumes 2-3 cups of caffeine daily   Social Determinants of Health   Financial Resource Strain:   . Difficulty of Paying Living Expenses:   Food Insecurity:   . Worried About Charity fundraiser in the Last Year:   . Arboriculturist in the Last Year:   Transportation Needs:   . Film/video editor (Medical):   Marland Kitchen Lack of Transportation (Non-Medical):   Physical Activity:   . Days of Exercise per Week:   . Minutes of Exercise per Session:   Stress:   . Feeling of Stress :   Social Connections:   . Frequency of Communication with Friends and Family:   . Frequency of Social Gatherings with Friends and Family:   . Attends Religious Services:   . Active Member of Clubs or Organizations:   . Attends Archivist Meetings:   Marland Kitchen Marital Status:   Intimate Partner Violence:   . Fear of Current or Ex-Partner:   . Emotionally Abused:   Marland Kitchen Physically Abused:   . Sexually Abused:     Family History  Problem Relation Age of Onset  . Hypertension Mother   . Healthy Sister   . Healthy Brother   . Healthy Son   . Heart attack Father   . Heart disease Father        Before age 12  . Hyperlipidemia Father   . Hypertension Father   . Prostate cancer Father   . Colon cancer Neg Hx     Current Outpatient Medications  Medication Sig Dispense Refill  . aspirin EC 81 MG tablet Take 81 mg by mouth daily.    Marland Kitchen atorvastatin (LIPITOR) 40 MG  tablet TAKE 1 TABLET EVERY DAY (NEED APPOINTMENT FOR FURTHER REFILLS) 30 tablet 0  . diclofenac (VOLTAREN) 75 MG EC tablet TAKE (1) TABLET BY MOUTH TWICE DAILY. 60 tablet 0  . Esomeprazole Magnesium (NEXIUM PO) Take 20 mg by mouth daily.     . metoprolol (LOPRESSOR) 50 MG tablet Take 1 tablet (50 mg total) by mouth  2 (two) times daily. 180 tablet 1  . Multiple Vitamins-Minerals (PRESERVISION AREDS 2 PO) Take 1 tablet by mouth 2 (two) times daily.     . Omega-3 Fatty Acids (FISH OIL) 1000 MG CPDR Take 1,000 mg by mouth 2 (two) times daily.     . promethazine (PHENERGAN) 12.5 MG tablet Take 1 tablet (12.5 mg total) by mouth every 6 (six) hours as needed for nausea or vomiting. 30 tablet 0  . Psyllium (METAMUCIL PO) Take 3 capsules by mouth daily.     . ramipril (ALTACE) 10 MG capsule Take 10 mg by mouth daily.     Marland Kitchen SYNTHROID 112 MCG tablet Take 112 mcg by mouth daily before breakfast.     . zolpidem (AMBIEN) 10 MG tablet Take 10 mg by mouth at bedtime as needed for sleep.     No current facility-administered medications for this visit.    No Known Allergies   REVIEW OF SYSTEMS:   [X]  denotes positive finding, [ ]  denotes negative finding Cardiac  Comments:  Chest pain or chest pressure:    Shortness of breath upon exertion:    Short of breath when lying flat:    Irregular heart rhythm:        Vascular    Pain in calf, thigh, or hip brought on by ambulation:    Pain in feet at night that wakes you up from your sleep:     Blood clot in your veins:    Leg swelling:         Pulmonary    Oxygen at home:    Productive cough:     Wheezing:         Neurologic    Sudden weakness in arms or legs:     Sudden numbness in arms or legs:     Sudden onset of difficulty speaking or slurred speech:    Temporary loss of vision in one eye:     Problems with dizziness:         Gastrointestinal    Blood in stool:     Vomited blood:         Genitourinary    Burning when urinating:      Blood in urine:        Psychiatric    Major depression:         Hematologic    Bleeding problems:    Problems with blood clotting too easily:        Skin    Rashes or ulcers:        Constitutional    Fever or chills:      PHYSICAL EXAMINATION:  There were no vitals filed for this visit. General:  WDWN in NAD; vital signs documented above Gait: Steady; unaided HENT: WNL, normocephalic Pulmonary: normal non-labored breathing , without Rales, rhonchi,  wheezing Cardiac: regular HR, without  Murmurs without carotid bruits Abdomen: soft, NT, no masses Skin: without rashes Vascular Exam/Pulses: Extremities: without ischemic changes, without Gangrene , without cellulitis; without open wounds;  Musculoskeletal: no muscle wasting or atrophy  Neurologic: A&O X 3;  No focal weakness or paresthesias are detected Psychiatric:  The pt has Normal affect.  Pulse exam: The patient has palpable 2+ brachial, radial, popliteal, dorsalis pedis and posterior tibial pulses.  Non-Invasive Vascular Imaging:   TODAY (10-24-2019) Right Carotid: Velocities in the right ICA are consistent with a 1-39%  stenosis.   Left Carotid: Velocities in the left ICA are consistent with a 1-39%  stenosis.   Vertebrals: Bilateral vertebral arteries demonstrate antegrade flow.  Subclavians: Normal flow hemodynamics were seen in bilateral subclavian        arteries.       Carotid Duplex(04-30-18): Patent right internal carotid artery with history of carotid endarterectomy, no significant stenosis present,and <40%left internal carotid artery stenosis. Right vertebral artery flow is antegrade, left is not identified.  Bilateral subclavian artery waveforms are normal.  Left vertebral artery was antegrade compared to the last exam on 04-27-17, otherwise no significant changes.     ASSESSMENT/PLAN:: 79 y.o. male here for follow up for history of right carotid endarterectomy.  He is asymptomatic.   He is maintained on aspirin.  Follow-up in 1 year with carotid duplex.  We reviewed signs and symptoms of stroke/TIA and to call EMS to proceed emergency department for onset of these  Barbie Banner, PA-C [facility] [phone]  Clinic MD:   Donzetta Matters

## 2019-10-28 ENCOUNTER — Other Ambulatory Visit: Payer: Self-pay | Admitting: *Deleted

## 2019-10-28 DIAGNOSIS — I6523 Occlusion and stenosis of bilateral carotid arteries: Secondary | ICD-10-CM

## 2019-12-04 DIAGNOSIS — G4733 Obstructive sleep apnea (adult) (pediatric): Secondary | ICD-10-CM | POA: Diagnosis not present

## 2019-12-23 ENCOUNTER — Other Ambulatory Visit: Payer: Self-pay

## 2019-12-23 ENCOUNTER — Encounter (INDEPENDENT_AMBULATORY_CARE_PROVIDER_SITE_OTHER): Payer: Self-pay | Admitting: Otolaryngology

## 2019-12-23 ENCOUNTER — Ambulatory Visit (INDEPENDENT_AMBULATORY_CARE_PROVIDER_SITE_OTHER): Payer: Medicare HMO | Admitting: Otolaryngology

## 2019-12-23 VITALS — Temp 97.9°F

## 2019-12-23 DIAGNOSIS — H6123 Impacted cerumen, bilateral: Secondary | ICD-10-CM

## 2019-12-23 DIAGNOSIS — J310 Chronic rhinitis: Secondary | ICD-10-CM | POA: Diagnosis not present

## 2019-12-23 DIAGNOSIS — H6982 Other specified disorders of Eustachian tube, left ear: Secondary | ICD-10-CM

## 2019-12-23 NOTE — Progress Notes (Signed)
HPI: [name] [name] is a 79 y.o. male who returns today for evaluation of ears.  He felt like he had a left ear infection.  This began with allergies and upper respiratory symptoms.  He has had no drainage from the ear. He had a previous myringotomy tube placed in the left ear a number of years ago..  Past Medical History:  Diagnosis Date  . Arthritis   . CAD (coronary artery disease)   . CAD (coronary artery disease) 12/04/2011   Left Main Disease with 3- vessel CAD   . CAD (coronary artery disease)    seeing Dr Harl Bowie  . CHF (congestive heart failure) ([place])   . Deviated septum   . Dyslipidemia   . GERD (gastroesophageal reflux disease)   . Heart attack ([place])   . Hyperlipidemia   . Hypertension   . Hypothyroidism   . MI (myocardial infarction) ([place]) 1997  . Neuropathy   . Obesity (BMI 30-39.9) 09/02/2018  . PONV (postoperative nausea and vomiting)    has in past, no problems after most recent surgery  . 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 S/P carotid endarterectomy 02/29/2012   Right CEA for asymptomatic severe right ICA stenosis   . 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 Shortness of breath    with exertion  . Sleep apnea    CPAP, sleep [facility] at [facility]  . 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   80-99% stenosis RICA, asymptomatic  . Thyroid nodule   . Wears glasses    or contacts   Past Surgical History:  Procedure Laterality Date  . ADENOIDECTOMY    . CARDIAC CATHETERIZATION     02/22/12  . CARDIOVASCULAR STRESS TEST  7/13  . CAROTID ENDARTERECTOMY    . COLONOSCOPY  15-20years and 12/04/11   Done at [place]  12/11/2011   Procedure: COLONOSCOPY;  Surgeon: Rogene Houston, MD;  [facility];  Service: Endoscopy;  Laterality: N/A;  830  . CORONARY ARTERY BYPASS GRAFT  02/29/2012   Procedure: CORONARY ARTERY BYPASS GRAFTING (CABG);  Surgeon: Rexene Alberts, MD;  Location: [place];  Service: Open Heart Surgery;  Laterality: N/A;  times three using Left Internal Mammary Artery and Right Greater Saphenous Vein Graft Harvested Endoscopically  . CORONARY STENT PLACEMENT    . ENDARTERECTOMY  02/29/2012   Procedure: ENDARTERECTOMY CAROTID;  Surgeon: Angelia Mould, MD;  Location: [facility];  Service: Vascular;  Laterality: Right;  . FOOT SURGERY  06/30/11   artificial joint in big toe right foot   . KNEE ARTHROSCOPY WITH MEDIAL MENISECTOMY Left 07/15/2019   Procedure: KNEE ARTHROSCOPY WITH MEDIAL MENISCECTOMY AND LATERAL MENISCECTOMY;  Surgeon: Carole Civil, MD;  Location: [facility];  Service: Orthopedics;  Laterality: Left;  . LEFT HEART CATHETERIZATION WITH CORONARY ANGIOGRAM N/A 02/20/2012   Procedure: LEFT HEART CATHETERIZATION WITH CORONARY ANGIOGRAM;  Surgeon: Troy Sine, MD;  Location: [facility] CATH LAB;  Service: Cardiovascular;  Laterality: N/A;  . NASAL SEPTUM SURGERY    . Totol thyroidectomy  02/06/2011  . TRANSESOPHAGEAL ECHOCARDIOGRAM    . UPPER GASTROINTESTINAL ENDOSCOPY  35-40 years ago   [place] History   Socioeconomic History  . Marital status: Married    Spouse name: Velva Harman  . Number of children: 1  . Years of education: 32  .  Highest education level: Not on file  Occupational History    Comment: retired  Tobacco Use  . Smoking status: Former Smoker    Packs/day: 0.50    Years: 15.00    Pack years: 7.50    Types: Cigarettes    Quit date: 09/04/2001    Years since quitting: 18.3  . Smokeless tobacco: Never Used  . Tobacco comment: smoked 75yrs, smoked 1/2 pack a day  Substance and Sexual Activity  . Alcohol use: Not Currently    Alcohol/week: 3.0 - 4.0 standard drinks    Types: 3 - 4 Cans of beer per week    Comment: social- beer , not on regular basis  . Drug use: No  . Sexual activity: Yes  Other Topics Concern  . Not on file  Social History Narrative   Consumes 2-3 cups of caffeine daily    Social Determinants of Health   Financial Resource Strain:   . Difficulty of Paying Living Expenses:   Food Insecurity:   . Worried About Charity fundraiser in the Last Year:   . Arboriculturist in the Last Year:   Transportation Needs:   . Film/video editor (Medical):   Marland Kitchen Lack of Transportation (Non-Medical):   Physical Activity:   . Days of Exercise per Week:   . Minutes of Exercise per Session:   Stress:   . Feeling of Stress :   Social Connections:   . Frequency of Communication with Friends and Family:   . Frequency of Social Gatherings with Friends and Family:   . Attends Religious Services:   . Active Member of Clubs or Organizations:   . Attends Archivist Meetings:   Marland Kitchen Marital Status:    Family History  Problem Relation Age of Onset  . Hypertension Mother   . Healthy Sister   . Healthy Brother   . Healthy Son   . Heart attack Father   . Heart disease Father        Before age 70  . Hyperlipidemia Father   . Hypertension Father   . Prostate cancer Father   . Colon cancer Neg Hx    No Known Allergies Prior to Admission medications   Medication Sig Start Date End Date Taking? Authorizing Provider  aspirin EC 81 MG tablet Take 81 mg by mouth daily.   Yes [provider]  atorvastatin (LIPITOR) 40 MG tablet TAKE 1 TABLET EVERY DAY (NEED APPOINTMENT FOR FURTHER REFILLS) 08/18/19  Yes Branch, Alphonse Guild, MD  diclofenac (VOLTAREN) 75 MG EC tablet TAKE (1) TABLET BY MOUTH TWICE DAILY. 10/22/19  Yes Carole Civil, MD  Esomeprazole Magnesium (NEXIUM PO) Take 20 mg by mouth daily.  08/19/14  Yes [provider]  metoprolol (LOPRESSOR) 50 MG tablet Take 1 tablet (50 mg total) by mouth 2 (two) times daily. 09/23/13  Yes BranchAlphonse Guild, MD  Multiple Vitamins-Minerals (PRESERVISION AREDS 2 PO) Take 1 tablet by mouth 2 (two) times daily.    Yes [provider]  Omega-3 Fatty Acids (FISH OIL) 1000 MG CPDR Take 1,000 mg by mouth 2  (two) times daily.    Yes [provider]  promethazine (PHENERGAN) 12.5 MG tablet Take 1 tablet (12.5 mg total) by mouth every 6 (six) hours as needed for nausea or vomiting. 07/15/19  Yes Carole Civil, MD  Psyllium (METAMUCIL PO) Take 3 capsules by mouth daily.    Yes [provider]  ramipril (ALTACE) 10 MG capsule  Take 10 mg by mouth daily.  03/03/13  Yes [provider]  SYNTHROID 112 MCG tablet Take 112 mcg by mouth daily before breakfast.  08/15/18  Yes [provider]  zolpidem (AMBIEN) 10 MG tablet Take 10 mg by mouth at bedtime as needed for sleep.   Yes [provider]     Positive ROS: Otherwise negative  All other systems have been reviewed and were otherwise negative with the exception of those mentioned in the HPI and as above.  Physical Exam: Constitutional: Alert, well-appearing, no acute distress Ears: External ears without lesions or tenderness.  He has some wax buildup in both ears that was cleaned with curettes and forceps.  The right TM was clear.  The left TM was clear with a myringotomy tube in place which is patent with no drainage noted.  The TM was otherwise clear. Nasal: External nose without lesions. Septum mildly deviated with moderate rhinitis.  Both middle meatus regions were clear with no acute purulent discharge noted.. Oral: Lips and gums without lesions. Tongue and palate mucosa without lesions. Posterior oropharynx clear. Neck: No palpable adenopathy or masses Respiratory: Breathing comfortably  Skin: No facial/neck lesions or rash noted.  Cerumen impaction removal  Date/Time: 12/23/2019 4:56 PM Performed by: Rozetta Nunnery, MD Authorized by: Rozetta Nunnery, MD   Consent:    Consent obtained:  Verbal   Consent given by:  Patient   Risks discussed:  Pain and bleeding Procedure details:    Location:  L ear and R ear   Procedure type: curette and forceps   Post-procedure details:     Inspection:  TM intact and canal normal   Hearing quality:  Improved   Patient tolerance of procedure:  Tolerated well, no immediate complications Comments:     Right TM is clear.  Left TM with longstanding myringotomy tube which is patent and dry.    Assessment: History of rhinitis with eustachian tube dysfunction. Longstanding myringotomy tube in the left TM.  Plan: Prescribed amoxicillin 875 mg twice daily for a week as he requested antibiotic. Also suggested using Flonase 2 sprays each nostril at night.  He has Flonase. He will follow-up as needed   Radene Journey, MD

## 2020-01-17 ENCOUNTER — Telehealth: Payer: Self-pay | Admitting: *Deleted

## 2020-01-17 NOTE — Telephone Encounter (Signed)
Informed patient of compliance results and verbalized understanding was indicated. Patient is aware and agreeable to AHI being within range at 3.4. Patient is aware and agreeable to being in compliance with machine usage. Patient is aware and agreeable to no change in current pressures.

## 2020-01-17 NOTE — Telephone Encounter (Signed)
-----   Message from Traci R Turner, MD sent at 01/16/2020  5:40 PM EDT ----- °Good AHI and compliance.  Continue current PAP settings. °

## 2020-01-22 DIAGNOSIS — M25562 Pain in left knee: Secondary | ICD-10-CM | POA: Diagnosis not present

## 2020-01-22 DIAGNOSIS — I129 Hypertensive chronic kidney disease with stage 1 through stage 4 chronic kidney disease, or unspecified chronic kidney disease: Secondary | ICD-10-CM | POA: Diagnosis not present

## 2020-01-22 DIAGNOSIS — G603 Idiopathic progressive neuropathy: Secondary | ICD-10-CM | POA: Diagnosis not present

## 2020-02-03 ENCOUNTER — Encounter (INDEPENDENT_AMBULATORY_CARE_PROVIDER_SITE_OTHER): Payer: Self-pay | Admitting: Otolaryngology

## 2020-02-03 ENCOUNTER — Other Ambulatory Visit: Payer: Self-pay

## 2020-02-03 ENCOUNTER — Ambulatory Visit (INDEPENDENT_AMBULATORY_CARE_PROVIDER_SITE_OTHER): Payer: Medicare HMO | Admitting: Otolaryngology

## 2020-02-03 VITALS — Temp 98.2°F

## 2020-02-03 DIAGNOSIS — H66005 Acute suppurative otitis media without spontaneous rupture of ear drum, recurrent, left ear: Secondary | ICD-10-CM

## 2020-02-03 NOTE — Progress Notes (Signed)
HPI: [name] [name] is a 79 y.o. male who returns today for evaluation of drainage from his left ear.  He had a tube placed in his left ear over 20 years ago.  This has intermittently drained over the past several years..  He has had a tube in the right ear previously also but does not have one presently.  He is having no problems on the right side.  Past Medical History:  Diagnosis Date  . Arthritis   . CAD (coronary artery disease)   . CAD (coronary artery disease) 12/04/2011   Left Main Disease with 3- vessel CAD   . CAD (coronary artery disease)    seeing Dr Harl Bowie  . CHF (congestive heart failure) ([place])   . Deviated septum   . Dyslipidemia   . GERD (gastroesophageal reflux disease)   . Heart attack ([place])   . Hyperlipidemia   . Hypertension   . Hypothyroidism   . MI (myocardial infarction) ([place]) 1997  . Neuropathy   . Obesity (BMI 30-39.9) 09/02/2018  . PONV (postoperative nausea and vomiting)    has in past, no problems after most recent surgery  . 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 S/P carotid endarterectomy 02/29/2012   Right CEA for asymptomatic severe right ICA stenosis   . 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 Shortness of breath    with exertion  . Sleep apnea    CPAP, sleep [facility] at [facility]  . 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   80-99% stenosis RICA, asymptomatic  . Thyroid nodule   . Wears glasses    or contacts   Past Surgical History:  Procedure Laterality Date  . ADENOIDECTOMY    . CARDIAC CATHETERIZATION     02/22/12  . CARDIOVASCULAR STRESS TEST  7/13  . CAROTID ENDARTERECTOMY    . COLONOSCOPY  15-20years and 12/04/11   Done at [facility]  12/11/2011   Procedure: COLONOSCOPY;  Surgeon: Rogene Houston, MD;  [facility];  Service: Endoscopy;  Laterality: N/A;  830  . CORONARY ARTERY BYPASS GRAFT  02/29/2012   Procedure: CORONARY  ARTERY BYPASS GRAFTING (CABG);  Surgeon: Rexene Alberts, MD;  Location: [place];  Service: Open Heart Surgery;  Laterality: N/A;  times three using Left Internal Mammary Artery and Right Greater Saphenous Vein Graft Harvested Endoscopically  . CORONARY STENT PLACEMENT    . ENDARTERECTOMY  02/29/2012   Procedure: ENDARTERECTOMY CAROTID;  Surgeon: Angelia Mould, MD;  Location: [place];  Service: Vascular;  Laterality: Right;  . FOOT SURGERY  06/30/11   artificial joint in big toe right foot   . KNEE ARTHROSCOPY WITH MEDIAL MENISECTOMY Left 07/15/2019   Procedure: KNEE ARTHROSCOPY WITH MEDIAL MENISCECTOMY AND LATERAL MENISCECTOMY;  Surgeon: Carole Civil, MD;  [facility];  Service: Orthopedics;  Laterality: Left;  . LEFT HEART CATHETERIZATION WITH CORONARY ANGIOGRAM N/A 02/20/2012   Procedure: LEFT HEART CATHETERIZATION WITH CORONARY ANGIOGRAM;  Surgeon: Troy Sine, MD;  Location: [facility] CATH LAB;  Service: Cardiovascular;  Laterality: N/A;  . NASAL SEPTUM SURGERY    . Totol thyroidectomy  02/06/2011  . TRANSESOPHAGEAL ECHOCARDIOGRAM    . UPPER GASTROINTESTINAL ENDOSCOPY  35-40 years ago   [place] History   Socioeconomic History  . Marital status: Married    Spouse name: Velva Harman  .  Number of children: 1  . Years of education: 26  . Highest education level: Not on file  Occupational History    Comment: retired  Tobacco Use  . Smoking status: Former Smoker    Packs/day: 0.50    Years: 15.00    Pack years: 7.50    Types: Cigarettes    Quit date: 09/04/2001    Years since quitting: 18.4  . Smokeless tobacco: Never Used  . Tobacco comment: smoked 38yrs, smoked 1/2 pack a day  Vaping Use  . Vaping Use: Never used  Substance and Sexual Activity  . Alcohol use: Not Currently    Alcohol/week: 3.0 - 4.0 standard drinks    Types: 3 - 4 Cans of beer per week    Comment: social- beer , not on regular basis  . Drug use: No  . Sexual activity: Yes  Other Topics  Concern  . Not on file  Social History Narrative   Consumes 2-3 cups of caffeine daily   Social Determinants of Health   Financial Resource Strain:   . Difficulty of Paying Living Expenses:   Food Insecurity:   . Worried About Charity fundraiser in the Last Year:   . Arboriculturist in the Last Year:   Transportation Needs:   . Film/video editor (Medical):   Marland Kitchen Lack of Transportation (Non-Medical):   Physical Activity:   . Days of Exercise per Week:   . Minutes of Exercise per Session:   Stress:   . Feeling of Stress :   Social Connections:   . Frequency of Communication with Friends and Family:   . Frequency of Social Gatherings with Friends and Family:   . Attends Religious Services:   . Active Member of Clubs or Organizations:   . Attends Archivist Meetings:   Marland Kitchen Marital Status:    Family History  Problem Relation Age of Onset  . Hypertension Mother   . Healthy Sister   . Healthy Brother   . Healthy Son   . Heart attack Father   . Heart disease Father        Before age 57  . Hyperlipidemia Father   . Hypertension Father   . Prostate cancer Father   . Colon cancer Neg Hx    No Known Allergies Prior to Admission medications   Medication Sig Start Date End Date Taking? Authorizing Provider  aspirin EC 81 MG tablet Take 81 mg by mouth daily.   Yes [provider]  atorvastatin (LIPITOR) 40 MG tablet TAKE 1 TABLET EVERY DAY (NEED APPOINTMENT FOR FURTHER REFILLS) 08/18/19  Yes Branch, Alphonse Guild, MD  diclofenac (VOLTAREN) 75 MG EC tablet TAKE (1) TABLET BY MOUTH TWICE DAILY. 10/22/19  Yes Carole Civil, MD  Esomeprazole Magnesium (NEXIUM PO) Take 20 mg by mouth daily.  08/19/14  Yes [provider]  metoprolol (LOPRESSOR) 50 MG tablet Take 1 tablet (50 mg total) by mouth 2 (two) times daily. 09/23/13  Yes BranchAlphonse Guild, MD  Multiple Vitamins-Minerals (PRESERVISION AREDS 2 PO) Take 1 tablet by mouth 2 (two) times daily.    Yes  [provider]  Omega-3 Fatty Acids (FISH OIL) 1000 MG CPDR Take 1,000 mg by mouth 2 (two) times daily.    Yes [provider]  promethazine (PHENERGAN) 12.5 MG tablet Take 1 tablet (12.5 mg total) by mouth every 6 (six) hours as needed for nausea or vomiting. 07/15/19  Yes Carole Civil, MD  Psyllium (  METAMUCIL PO) Take 3 capsules by mouth daily.    Yes [provider]  ramipril (ALTACE) 10 MG capsule Take 10 mg by mouth daily.  03/03/13  Yes [provider]  SYNTHROID 112 MCG tablet Take 112 mcg by mouth daily before breakfast.  08/15/18  Yes [provider]  zolpidem (AMBIEN) 10 MG tablet Take 10 mg by mouth at bedtime as needed for sleep.   Yes [provider]     Positive ROS: Otherwise negative  All other systems have been reviewed and were otherwise negative with the exception of those mentioned in the HPI and as above.  Physical Exam: Constitutional: Alert, well-appearing, no acute distress Ears: External ears without lesions or tenderness.  Right ear canal and right TM are clear.  On microscopic exam of the left ear he has purulent discharge with mild inflammatory changes of the ear canal.  I was able to clean the ear canal and suction the mucopurulent discharge from the left myringotomy tube and insufflated Ciprodex eardrops. Nasal: External nose without lesions.. Clear nasal passages with no signs of infection Oral: Lips and gums without lesions. Tongue and palate mucosa without lesions. Posterior oropharynx clear. Neck: No palpable adenopathy or masses Respiratory: Breathing comfortably  Skin: No facial/neck lesions or rash noted.  Binocular microscopy  Date/Time: 02/03/2020 4:47 PM Performed by: Rozetta Nunnery, MD Authorized by: Rozetta Nunnery, MD   Consent:    Consent obtained:  Verbal   Consent given by:  Patient   Alternatives discussed:  No treatment Procedure details:    Indications: otitis media  and tympanostomy tube follow-up     Scope location: bilateral     Right speculum size:  4 Cerumen:    Right ear: normal quantity of cerumen     Left ear: normal quantity of cerumen   Right ear canal:    normal   Post-procedure details:    Patient tolerance of procedure:  Tolerated well, no immediate complications Comments:     Patient with purulent discharge from left T-tube.  After suctioning and cleaning this I applied the Ciprodex drops which were insufflated into the middle ear space    Assessment: Acute left otitis media with drainage from left myringotomy tube  Plan: Placed him on Ciprodex dexamethasone otic suspension drops 5 drops twice daily for 1 week. He will notify us if he has any persistent drainage or recurrent drainage. Also briefly discussed possibility of removing the left myringotomy tube.   Radene Journey, MD

## 2020-02-26 DIAGNOSIS — Z0100 Encounter for examination of eyes and vision without abnormal findings: Secondary | ICD-10-CM | POA: Diagnosis not present

## 2020-03-03 DIAGNOSIS — F5101 Primary insomnia: Secondary | ICD-10-CM | POA: Diagnosis not present

## 2020-03-03 DIAGNOSIS — R7303 Prediabetes: Secondary | ICD-10-CM | POA: Diagnosis not present

## 2020-03-03 DIAGNOSIS — R7301 Impaired fasting glucose: Secondary | ICD-10-CM | POA: Diagnosis not present

## 2020-03-03 DIAGNOSIS — I129 Hypertensive chronic kidney disease with stage 1 through stage 4 chronic kidney disease, or unspecified chronic kidney disease: Secondary | ICD-10-CM | POA: Diagnosis not present

## 2020-03-03 DIAGNOSIS — E782 Mixed hyperlipidemia: Secondary | ICD-10-CM | POA: Diagnosis not present

## 2020-03-03 DIAGNOSIS — I10 Essential (primary) hypertension: Secondary | ICD-10-CM | POA: Diagnosis not present

## 2020-03-03 DIAGNOSIS — E039 Hypothyroidism, unspecified: Secondary | ICD-10-CM | POA: Diagnosis not present

## 2020-03-03 DIAGNOSIS — E875 Hyperkalemia: Secondary | ICD-10-CM | POA: Diagnosis not present

## 2020-03-03 DIAGNOSIS — N182 Chronic kidney disease, stage 2 (mild): Secondary | ICD-10-CM | POA: Diagnosis not present

## 2020-03-04 DIAGNOSIS — G4733 Obstructive sleep apnea (adult) (pediatric): Secondary | ICD-10-CM | POA: Diagnosis not present

## 2020-03-23 DIAGNOSIS — F5101 Primary insomnia: Secondary | ICD-10-CM | POA: Diagnosis not present

## 2020-03-23 DIAGNOSIS — S90421A Blister (nonthermal), right great toe, initial encounter: Secondary | ICD-10-CM | POA: Diagnosis not present

## 2020-04-05 DIAGNOSIS — G2581 Restless legs syndrome: Secondary | ICD-10-CM | POA: Diagnosis not present

## 2020-04-05 DIAGNOSIS — R7301 Impaired fasting glucose: Secondary | ICD-10-CM | POA: Diagnosis not present

## 2020-04-05 DIAGNOSIS — E782 Mixed hyperlipidemia: Secondary | ICD-10-CM | POA: Diagnosis not present

## 2020-04-05 DIAGNOSIS — G629 Polyneuropathy, unspecified: Secondary | ICD-10-CM | POA: Diagnosis not present

## 2020-04-05 DIAGNOSIS — B354 Tinea corporis: Secondary | ICD-10-CM | POA: Diagnosis not present

## 2020-04-05 DIAGNOSIS — Z0000 Encounter for general adult medical examination without abnormal findings: Secondary | ICD-10-CM | POA: Diagnosis not present

## 2020-04-05 DIAGNOSIS — I2510 Atherosclerotic heart disease of native coronary artery without angina pectoris: Secondary | ICD-10-CM | POA: Diagnosis not present

## 2020-04-05 DIAGNOSIS — I10 Essential (primary) hypertension: Secondary | ICD-10-CM | POA: Diagnosis not present

## 2020-04-05 DIAGNOSIS — F5101 Primary insomnia: Secondary | ICD-10-CM | POA: Diagnosis not present

## 2020-04-09 DIAGNOSIS — Z0001 Encounter for general adult medical examination with abnormal findings: Secondary | ICD-10-CM | POA: Diagnosis not present

## 2020-04-13 ENCOUNTER — Encounter: Payer: Self-pay | Admitting: Neurology

## 2020-04-23 ENCOUNTER — Encounter (INDEPENDENT_AMBULATORY_CARE_PROVIDER_SITE_OTHER): Payer: Self-pay | Admitting: Otolaryngology

## 2020-04-23 ENCOUNTER — Ambulatory Visit (INDEPENDENT_AMBULATORY_CARE_PROVIDER_SITE_OTHER): Payer: Medicare HMO | Admitting: Otolaryngology

## 2020-04-23 ENCOUNTER — Other Ambulatory Visit: Payer: Self-pay

## 2020-04-23 VITALS — Temp 98.2°F

## 2020-04-23 DIAGNOSIS — H6983 Other specified disorders of Eustachian tube, bilateral: Secondary | ICD-10-CM | POA: Diagnosis not present

## 2020-04-23 DIAGNOSIS — H6504 Acute serous otitis media, recurrent, right ear: Secondary | ICD-10-CM

## 2020-04-23 NOTE — Progress Notes (Signed)
HPI: [name] [name] is a 79 y.o. male who returns today for evaluation of ears.  He feels like his ears are clogged and he feels a fluid in both ears.  He had a permanent tube placed in the left ear by Dr. Olene Craven over 10 years ago.  Prior to that he had had numerous tubes placed..  He has had a Paparella tube in the right ear previously by myself.  He apparently has had intermittent drainage from the left T-tube.  Past Medical History:  Diagnosis Date  . Arthritis   . CAD (coronary artery disease)   . CAD (coronary artery disease) 12/04/2011   Left Main Disease with 3- vessel CAD   . CAD (coronary artery disease)    seeing Dr Harl Bowie  . CHF (congestive heart failure) ([place])   . Deviated septum   . Dyslipidemia   . GERD (gastroesophageal reflux disease)   . Heart attack ([place])   . Hyperlipidemia   . Hypertension   . Hypothyroidism   . MI (myocardial infarction) ([place]) 1997  . Neuropathy   . Obesity (BMI 30-39.9) 09/02/2018  . PONV (postoperative nausea and vomiting)    has in past, no problems after most recent surgery  . 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 S/P carotid endarterectomy 02/29/2012   Right CEA for asymptomatic severe right ICA stenosis   . 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 Shortness of breath    with exertion  . Sleep apnea    CPAP, sleep study at [facility]  . 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   80-99% stenosis RICA, asymptomatic  . Thyroid nodule   . Wears glasses    or contacts   Past Surgical History:  Procedure Laterality Date  . ADENOIDECTOMY    . CARDIAC CATHETERIZATION     02/22/12  . CARDIOVASCULAR STRESS TEST  7/13  . CAROTID ENDARTERECTOMY    . COLONOSCOPY  15-20years and 12/04/11   Done at [facility]  12/11/2011   Procedure: COLONOSCOPY;  Surgeon: Rogene Houston, MD;  [facility];  Service: Endoscopy;  Laterality: N/A;  830   . CORONARY ARTERY BYPASS GRAFT  02/29/2012   Procedure: CORONARY ARTERY BYPASS GRAFTING (CABG);  Surgeon: Rexene Alberts, MD;  Location: [place];  Service: Open Heart Surgery;  Laterality: N/A;  times three using Left Internal Mammary Artery and Right Greater Saphenous Vein Graft Harvested Endoscopically  . CORONARY STENT PLACEMENT    . ENDARTERECTOMY  02/29/2012   Procedure: ENDARTERECTOMY CAROTID;  Surgeon: Angelia Mould, MD;  Location: [place];  Service: Vascular;  Laterality: Right;  . FOOT SURGERY  06/30/11   artificial joint in big toe right foot   . KNEE ARTHROSCOPY WITH MEDIAL MENISECTOMY Left 07/15/2019   Procedure: KNEE ARTHROSCOPY WITH MEDIAL MENISCECTOMY AND LATERAL MENISCECTOMY;  Surgeon: Carole Civil, MD;  [facility];  Service: Orthopedics;  Laterality: Left;  . LEFT HEART CATHETERIZATION WITH CORONARY ANGIOGRAM N/A 02/20/2012   Procedure: LEFT HEART CATHETERIZATION WITH CORONARY ANGIOGRAM;  Surgeon: Troy Sine, MD;  Location: [facility] CATH LAB;  Service: Cardiovascular;  Laterality: N/A;  . NASAL SEPTUM SURGERY    . Totol thyroidectomy  02/06/2011  . TRANSESOPHAGEAL ECHOCARDIOGRAM    . UPPER GASTROINTESTINAL ENDOSCOPY  35-40 years ago   [place]  History  . Marital status: Married    Spouse name: Velva Harman  . Number of children: 1  . Years of education: 31  . Highest education level: Not on file  Occupational History    Comment: retired  Tobacco Use  . Smoking status: Former Smoker    Packs/day: 0.50    Years: 15.00    Pack years: 7.50    Types: Cigarettes    Quit date: 09/04/2001    Years since quitting: 18.6  . Smokeless tobacco: Never Used  . Tobacco comment: smoked 61yrs, smoked 1/2 pack a day  Vaping Use  . Vaping Use: Never used  Substance and Sexual Activity  . Alcohol use: Not Currently    Alcohol/week: 3.0 - 4.0 standard drinks    Types: 3 - 4 Cans of beer per week    Comment: social- beer , not on regular  basis  . Drug use: No  . Sexual activity: Yes  Other Topics Concern  . Not on file  Social History Narrative   Consumes 2-3 cups of caffeine daily   Social Determinants of Health   Financial Resource Strain:   . Difficulty of Paying Living Expenses: Not on file  Food Insecurity:   . Worried About Charity fundraiser in the Last Year: Not on file  . Ran Out of Food in the Last Year: Not on file  Transportation Needs:   . Lack of Transportation (Medical): Not on file  . Lack of Transportation (Non-Medical): Not on file  Physical Activity:   . Days of Exercise per Week: Not on file  . Minutes of Exercise per Session: Not on file  Stress:   . Feeling of Stress : Not on file  Social Connections:   . Frequency of Communication with Friends and Family: Not on file  . Frequency of Social Gatherings with Friends and Family: Not on file  . Attends Religious Services: Not on file  . Active Member of Clubs or Organizations: Not on file  . Attends Archivist Meetings: Not on file  . Marital Status: Not on file   Family History  Problem Relation Age of Onset  . Hypertension Mother   . Healthy Sister   . Healthy Brother   . Healthy Son   . Heart attack Father   . Heart disease Father        Before age 70  . Hyperlipidemia Father   . Hypertension Father   . Prostate cancer Father   . Colon cancer Neg Hx    No Known Allergies Prior to Admission medications   Medication Sig Start Date End Date Taking? Authorizing Provider  aspirin EC 81 MG tablet Take 81 mg by mouth daily.   Yes [provider]  atorvastatin (LIPITOR) 40 MG tablet TAKE 1 TABLET EVERY DAY (NEED APPOINTMENT FOR FURTHER REFILLS) 08/18/19  Yes Branch, Alphonse Guild, MD  diclofenac (VOLTAREN) 75 MG EC tablet TAKE (1) TABLET BY MOUTH TWICE DAILY. 10/22/19  Yes Carole Civil, MD  Esomeprazole Magnesium (NEXIUM PO) Take 20 mg by mouth daily.  08/19/14  Yes [provider]  metoprolol (LOPRESSOR) 50  MG tablet Take 1 tablet (50 mg total) by mouth 2 (two) times daily. 09/23/13  Yes BranchAlphonse Guild, MD  Multiple Vitamins-Minerals (PRESERVISION AREDS 2 PO) Take 1 tablet by mouth 2 (two) times daily.    Yes [provider]  Omega-3 Fatty Acids (FISH OIL) 1000 MG CPDR Take 1,000 mg by mouth 2 (two)  times daily.    Yes [provider]  promethazine (PHENERGAN) 12.5 MG tablet Take 1 tablet (12.5 mg total) by mouth every 6 (six) hours as needed for nausea or vomiting. 07/15/19  Yes Carole Civil, MD  Psyllium (METAMUCIL PO) Take 3 capsules by mouth daily.    Yes [provider]  ramipril (ALTACE) 10 MG capsule Take 10 mg by mouth daily.  03/03/13  Yes [provider]  SYNTHROID 112 MCG tablet Take 112 mcg by mouth daily before breakfast.  08/15/18  Yes [provider]  zolpidem (AMBIEN) 10 MG tablet Take 10 mg by mouth at bedtime as needed for sleep.   Yes [provider]     Positive ROS: Otherwise negative  All other systems have been reviewed and were otherwise negative with the exception of those mentioned in the HPI and as above.  Physical Exam: Constitutional: Alert, well-appearing, no acute distress Ears: External ears without lesions or tenderness. Ear canals are clear bilaterally.  Right TM with minimal right serous otitis.  Left TM tube is patent with a little bit of drainage from the tube.  This was suctioned using #3 suction in the ear canal.  Applied CSF powder. Nasal: External nose without lesions. Septum with mild deformity and moderate rhinitis.  No clinical signs of active infection.. Clear nasal passages Oral: Lips and gums without lesions. Tongue and palate mucosa without lesions. Posterior oropharynx clear. Neck: No palpable adenopathy or masses Respiratory: Breathing comfortably  Skin: No facial/neck lesions or rash noted.  Procedures  Assessment: Minimal right serous otitis media.  Chronic left T-tube with  intermittent drainage. Chronic rhinitis.  No clinical signs of active infection.  Plan: Recommended regular use of Nasacort 2 sprays each nostril at night and to "pop" his ears daily. Gave him a prescription for ciprofloxacin dexamethasone eardrops 4 to 5 drops twice daily x5 days as needed any drainage from the left ear. Could consider placement of the T-tube in the right ear if the fluid does not clear. He will follow-up as needed.   Radene Journey, MD

## 2020-05-03 DIAGNOSIS — I129 Hypertensive chronic kidney disease with stage 1 through stage 4 chronic kidney disease, or unspecified chronic kidney disease: Secondary | ICD-10-CM | POA: Diagnosis not present

## 2020-05-03 DIAGNOSIS — N182 Chronic kidney disease, stage 2 (mild): Secondary | ICD-10-CM | POA: Diagnosis not present

## 2020-05-03 DIAGNOSIS — E7849 Other hyperlipidemia: Secondary | ICD-10-CM | POA: Diagnosis not present

## 2020-05-18 ENCOUNTER — Other Ambulatory Visit: Payer: Self-pay

## 2020-05-18 ENCOUNTER — Ambulatory Visit (INDEPENDENT_AMBULATORY_CARE_PROVIDER_SITE_OTHER): Payer: 59

## 2020-05-18 ENCOUNTER — Ambulatory Visit (INDEPENDENT_AMBULATORY_CARE_PROVIDER_SITE_OTHER): Payer: 59 | Admitting: Podiatry

## 2020-05-18 DIAGNOSIS — M778 Other enthesopathies, not elsewhere classified: Secondary | ICD-10-CM

## 2020-05-18 DIAGNOSIS — M205X1 Other deformities of toe(s) (acquired), right foot: Secondary | ICD-10-CM

## 2020-05-19 ENCOUNTER — Telehealth: Payer: Self-pay | Admitting: Cardiology

## 2020-05-19 NOTE — Telephone Encounter (Signed)
   [facility] Pre-operative Risk Assessment    HEARTCARE STAFF: - Please ensure there is not already an duplicate clearance open for this procedure. - Under Visit Info/Reason for Call, type in Other and utilize the format Clearance MM/DD/YY or Clearance TBD. Do not use dashes or single digits. - If request is for dental extraction, please clarify the # of teeth to be extracted.  Request for surgical clearance:  1. What type of surgery is being performed? First MPJ arthrodesis  2. When is this surgery scheduled? TBD   3. What type of clearance is required (medical clearance vs. Pharmacy clearance to hold med vs. Both)? both  4. Are there any medications that need to be held prior to surgery and how long? Up to the cardiologist  5. Practice name and name of physician performing surgery? Dr. Celesta Gentile at [facility]  6. What is the office phone number? [phone]   7. What is the office fax number? [phone]  8.   Anesthesia type (None, local, MAC, general) ? General    Leah Newnam 05/19/2020, 3:14 PM  _________________________________________________________________   (provider comments below)

## 2020-05-19 NOTE — Telephone Encounter (Signed)
   Primary Cardiologist: Carlyle Dolly, MD  Chart reviewed as part of pre-operative protocol coverage. Because of [name] [name]'s past medical history and time since last visit, he will require a follow-up visit in order to better assess preoperative cardiovascular risk.  Pre-op covering staff: - Please schedule appointment and call patient to inform them. If patient already had an upcoming appointment within acceptable timeframe, please add "pre-op clearance" to the appointment notes so provider is aware. - Please contact requesting surgeon's office via preferred method (i.e, phone, fax) to inform them of need for appointment prior to surgery.  If applicable, this message will also be routed to pharmacy pool and/or primary cardiologist for input on holding anticoagulant/antiplatelet agent as requested below so that this information is available to the clearing provider at time of patient's appointment.   Ledora Bottcher, PA  05/19/2020, 3:33 PM

## 2020-05-19 NOTE — Telephone Encounter (Signed)
I will send message to [facility] scheduling to see if they may be able to squeeze pt in for pre op appt.

## 2020-05-19 NOTE — Progress Notes (Signed)
Subjective:   Patient ID: [name] [name], male   DOB: 79 y.o.   MRN: [id number]   HPI 79 year old male presents the office with concerns of pain to his right foot along the big toe joint.  He is concerned about pain in the big toe joint but also his toes bleeding and feels that it is "cramping" in his shoes.  He previously had hemiimplant arthroplasty of the first MPJ about 7 years ago by another provider.  He states he has been having pain for last couple of years but is been getting worse.  He said no recent treatment.  He has no other concerns today.   Review of Systems  All other systems reviewed and are negative.  Past Medical History:  Diagnosis Date  . Arthritis   . CAD (coronary artery disease)   . CAD (coronary artery disease) 12/04/2011   Left Main Disease with 3- vessel CAD   . CAD (coronary artery disease)    seeing Dr Harl Bowie  . CHF (congestive heart failure) ([place])   . Deviated septum   . Dyslipidemia   . GERD (gastroesophageal reflux disease)   . Heart attack ([place])   . Hyperlipidemia   . Hypertension   . Hypothyroidism   . MI (myocardial infarction) ([place]) 1997  . Neuropathy   . Obesity (BMI 30-39.9) 09/02/2018  . PONV (postoperative nausea and vomiting)    has in past, no problems after most recent surgery  . 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 S/P carotid endarterectomy 02/29/2012   Right CEA for asymptomatic severe right ICA stenosis   . 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 Shortness of breath    with exertion  . Sleep apnea    CPAP, sleep study at [facility]  . 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   80-99% stenosis RICA, asymptomatic  . Thyroid nodule   . Wears glasses    or contacts    Past Surgical History:  Procedure Laterality Date  . ADENOIDECTOMY    . CARDIAC CATHETERIZATION     02/22/12  . CARDIOVASCULAR STRESS TEST  7/13  . CAROTID ENDARTERECTOMY    .  COLONOSCOPY  15-20years and 12/04/11   Done at [place]  12/11/2011   Procedure: COLONOSCOPY;  Surgeon: Rogene Houston, MD;  [facility];  Service: Endoscopy;  Laterality: N/A;  830  . CORONARY ARTERY BYPASS GRAFT  02/29/2012   Procedure: CORONARY ARTERY BYPASS GRAFTING (CABG);  Surgeon: Rexene Alberts, MD;  Location: [place];  Service: Open Heart Surgery;  Laterality: N/A;  times three using Left Internal Mammary Artery and Right Greater Saphenous Vein Graft Harvested Endoscopically  . CORONARY STENT PLACEMENT    . ENDARTERECTOMY  02/29/2012   Procedure: ENDARTERECTOMY CAROTID;  Surgeon: Angelia Mould, MD;  Location: [place];  Service: Vascular;  Laterality: Right;  . FOOT SURGERY  06/30/11   artificial joint in big toe right foot   . KNEE ARTHROSCOPY WITH MEDIAL MENISECTOMY Left 07/15/2019   Procedure: KNEE ARTHROSCOPY WITH MEDIAL MENISCECTOMY AND LATERAL MENISCECTOMY;  Surgeon: Carole Civil, MD;  Location: [facility];  Service: Orthopedics;  Laterality: Left;  . LEFT HEART CATHETERIZATION WITH CORONARY ANGIOGRAM N/A 02/20/2012   Procedure: LEFT HEART CATHETERIZATION WITH CORONARY ANGIOGRAM;  Surgeon: Troy Sine, MD;  Location: [facility] CATH LAB;  Service: Cardiovascular;  Laterality: N/A;  . NASAL SEPTUM SURGERY    . Totol thyroidectomy  02/06/2011  . TRANSESOPHAGEAL ECHOCARDIOGRAM    . UPPER GASTROINTESTINAL ENDOSCOPY  35-40 years ago   [facility]     Current Outpatient Medications:  .  aspirin EC 81 MG tablet, Take 81 mg by mouth daily., Disp: , Rfl:  .  atorvastatin (LIPITOR) 40 MG tablet, TAKE 1 TABLET EVERY DAY (NEED APPOINTMENT FOR FURTHER REFILLS), Disp: 30 tablet, Rfl: 0 .  diclofenac (VOLTAREN) 75 MG EC tablet, TAKE (1) TABLET BY MOUTH TWICE DAILY., Disp: 60 tablet, Rfl: 0 .  Esomeprazole Magnesium (NEXIUM PO), Take 20 mg by mouth daily. , Disp: , Rfl:  .  metoprolol (LOPRESSOR) 50 MG tablet, Take 1 tablet (50 mg total) by mouth 2 (two)  times daily., Disp: 180 tablet, Rfl: 1 .  Multiple Vitamins-Minerals (PRESERVISION AREDS 2 PO), Take 1 tablet by mouth 2 (two) times daily. , Disp: , Rfl:  .  Omega-3 Fatty Acids (FISH OIL) 1000 MG CPDR, Take 1,000 mg by mouth 2 (two) times daily. , Disp: , Rfl:  .  promethazine (PHENERGAN) 12.5 MG tablet, Take 1 tablet (12.5 mg total) by mouth every 6 (six) hours as needed for nausea or vomiting., Disp: 30 tablet, Rfl: 0 .  Psyllium (METAMUCIL PO), Take 3 capsules by mouth daily. , Disp: , Rfl:  .  ramipril (ALTACE) 10 MG capsule, Take 10 mg by mouth daily. , Disp: , Rfl:  .  SYNTHROID 112 MCG tablet, Take 112 mcg by mouth daily before breakfast. , Disp: , Rfl:  .  zolpidem (AMBIEN) 10 MG tablet, Take 10 mg by mouth at bedtime as needed for sleep., Disp: , Rfl:   No Known Allergies       Objective:  Physical Exam  General: AAO x3, NAD  Dermatological: Skin is warm, dry and supple bilateral. There are no open sores, no preulcerative lesions, no rash or signs of infection present.  Vascular: Dorsalis Pedis artery and Posterior Tibial artery pedal pulses are 2/4 bilateral with immedate capillary fill time.There is no pain with calf compression, swelling, warmth, erythema.   Neruologic: Grossly intact via light touch bilateral.  Musculoskeletal: There is discomfort with first MPJ range of motion with all crepitation.  Also a small amount of crepitus is also noted.  Tenderness most of the first MPJ.  No other area discomfort identified at this time.  Muscular strength 5/5 in all groups tested bilateral.  Gait: Unassisted, Nonantalgic.       Assessment:   79 year old male right first MPJ hallux limitus, hallux abductus    Plan:  -Treatment options discussed including all alternatives, risks, and complications -Etiology of symptoms were discussed -X-rays were obtained and reviewed with the patient.  Previous implant arthroplasty of the first MPJ with hemiimplant noted.  No evidence of  acute fracture. -We discussed with conservative as well as surgical treatment options.  Conservatively discussed a toe separator which I dispensed as well as wearing stiffer soled shoe when change of shoe gear.  Surgical I discussed plan for Subedi arthrodesis.  We will try to start the process of medical clearance for potential surgery.  For now continue stiffer soled shoes, Voltaren gel as needed as well as a toe separator.  Trula Slade DPM

## 2020-05-20 ENCOUNTER — Ambulatory Visit (INDEPENDENT_AMBULATORY_CARE_PROVIDER_SITE_OTHER): Payer: Medicare HMO | Admitting: Physician Assistant

## 2020-05-20 ENCOUNTER — Other Ambulatory Visit: Payer: Self-pay

## 2020-05-20 ENCOUNTER — Encounter: Payer: Self-pay | Admitting: Physician Assistant

## 2020-05-20 DIAGNOSIS — D485 Neoplasm of uncertain behavior of skin: Secondary | ICD-10-CM

## 2020-05-20 DIAGNOSIS — Z85828 Personal history of other malignant neoplasm of skin: Secondary | ICD-10-CM | POA: Diagnosis not present

## 2020-05-20 DIAGNOSIS — Z1283 Encounter for screening for malignant neoplasm of skin: Secondary | ICD-10-CM

## 2020-05-20 DIAGNOSIS — L570 Actinic keratosis: Secondary | ICD-10-CM | POA: Diagnosis not present

## 2020-05-20 DIAGNOSIS — L820 Inflamed seborrheic keratosis: Secondary | ICD-10-CM | POA: Diagnosis not present

## 2020-05-20 DIAGNOSIS — Z8589 Personal history of malignant neoplasm of other organs and systems: Secondary | ICD-10-CM

## 2020-05-20 MED ORDER — FLUOROURACIL 5 % EX CREA: TOPICAL_CREAM | Freq: Every day | CUTANEOUS | 1 refills | Status: DC

## 2020-05-20 NOTE — Patient Instructions (Signed)

## 2020-05-20 NOTE — Telephone Encounter (Signed)
I will forward to front desk for scheduling

## 2020-05-20 NOTE — Telephone Encounter (Signed)
Apt made for tomorrow with [name] [name] at the [facility] office.

## 2020-05-20 NOTE — Progress Notes (Signed)
Cardiology Office Note  Date: 05/21/2020   ID: [name] [name], DOB [birth year], MRN [id number]  PCP:  Celene Squibb, MD  Cardiologist:  Carlyle Dolly, MD Electrophysiologist:  None   Chief Complaint: Needs preop clearance for foot surgery  History of Present Illness: [name] [name] is a 79 y.o. male with a history of CAD/MI (status post CABG x3 (LIMA-LAD, SVG-OM 2, SVG-PDA), CHF, HLD, HTN, GERD, hypothyroidism, carotid artery disease (right CEA), OSA on CPAP, obesity.  Last encounter with Dr. Radford Pax 08/28/2019 via telemedicine for obstructive sleep apnea follow-up. He wanted to get a new CPAP machine and underwent CPAP titration since he did not know what pressure he was on.  He was doing well with his CPAP device and felt he had gotten used to it.  He felt more rested and had no significant daytime sleepiness.  His blood pressure was controlled and he was continuing Lopressor 50 mg p.o. twice daily along with ramipril 10 mg daily.  Creatinine was stable at 1.19 June 2019.  He was encouraged to get into a routine exercise program and cut back on carbs and portions.   Patient is here for cardiac clearance for surgery on his right foot.  Surgery is to be done by Dr. Mayo Ao at [facility].  He denies any recent anginal or exertional symptoms, palpitations or arrhythmias, orthostatic symptoms, CVA or TIA-like symptoms, lightheadedness, dizziness, presyncope or syncopal episodes.  Denies any PND, orthopnea, bleeding, claudication, DVT or PE-like symptoms.   Past Medical History:  Diagnosis Date  . Arthritis   . CAD (coronary artery disease)   . CAD (coronary artery disease) 12/04/2011   Left Main Disease with 3- vessel CAD   . CAD (coronary artery disease)    seeing Dr Harl Bowie  . CHF (congestive heart failure) ([place])   . Deviated septum   . Dyslipidemia   . GERD (gastroesophageal reflux disease)   . Heart attack ([place])   . Hyperlipidemia   . Hypertension   .  Hypothyroidism   . MI (myocardial infarction) ([place]) 1997  . Neuropathy   . Obesity (BMI 30-39.9) 09/02/2018  . PONV (postoperative nausea and vomiting)    has in past, no problems after most recent surgery  . 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 S/P carotid endarterectomy 02/29/2012   Right CEA for asymptomatic severe right ICA stenosis   . 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 Shortness of breath    with exertion  . Sleep apnea    CPAP, sleep study at [facility]  . 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   80-99% stenosis RICA, asymptomatic  . Thyroid nodule   . Wears glasses    or contacts    Past Surgical History:  Procedure Laterality Date  . ADENOIDECTOMY    . CARDIAC CATHETERIZATION     02/22/12  . CARDIOVASCULAR STRESS TEST  7/13  . CAROTID ENDARTERECTOMY    . COLONOSCOPY  15-20years and 12/04/11   Done at [place]  12/11/2011   Procedure: COLONOSCOPY;  Surgeon: Rogene Houston, MD;  [facility];  Service: Endoscopy;  Laterality: N/A;  830  . CORONARY ARTERY BYPASS GRAFT  02/29/2012   Procedure: CORONARY ARTERY BYPASS GRAFTING (CABG);  Surgeon: Rexene Alberts, MD;  Location: ;  Service: Open Heart Surgery;  Laterality:  N/A;  times three using Left Internal Mammary Artery and Right Greater Saphenous Vein Graft Harvested Endoscopically  . CORONARY STENT PLACEMENT    . ENDARTERECTOMY  02/29/2012   Procedure: ENDARTERECTOMY CAROTID;  Surgeon: Angelia Mould, MD;  Location: [place];  Service: Vascular;  Laterality: Right;  . FOOT SURGERY  06/30/11   artificial joint in big toe right foot   . KNEE ARTHROSCOPY WITH MEDIAL MENISECTOMY Left 07/15/2019   Procedure: KNEE ARTHROSCOPY WITH MEDIAL MENISCECTOMY AND LATERAL MENISCECTOMY;  Surgeon: Carole Civil, MD;  Location: [facility];  Service: Orthopedics;  Laterality: Left;  . LEFT HEART CATHETERIZATION WITH  CORONARY ANGIOGRAM N/A 02/20/2012   Procedure: LEFT HEART CATHETERIZATION WITH CORONARY ANGIOGRAM;  Surgeon: Troy Sine, MD;  Location: [facility] CATH LAB;  Service: Cardiovascular;  Laterality: N/A;  . NASAL SEPTUM SURGERY    . Totol thyroidectomy  02/06/2011  . TRANSESOPHAGEAL ECHOCARDIOGRAM    . UPPER GASTROINTESTINAL ENDOSCOPY  35-40 years ago   [facility]    Current Outpatient Medications  Medication Sig Dispense Refill  . aspirin EC 81 MG tablet Take 81 mg by mouth daily.    Marland Kitchen atorvastatin (LIPITOR) 40 MG tablet TAKE 1 TABLET EVERY DAY (NEED APPOINTMENT FOR FURTHER REFILLS) 30 tablet 0  . diclofenac (VOLTAREN) 75 MG EC tablet TAKE (1) TABLET BY MOUTH TWICE DAILY. 60 tablet 0  . Esomeprazole Magnesium (NEXIUM PO) Take 20 mg by mouth daily.     . fluorouracil (EFUDEX) 5 % cream Apply topically at bedtime. 40 g 1  . metoprolol (LOPRESSOR) 50 MG tablet Take 1 tablet (50 mg total) by mouth 2 (two) times daily. 180 tablet 1  . Multiple Vitamins-Minerals (PRESERVISION AREDS 2 PO) Take 1 tablet by mouth 2 (two) times daily.     . Omega-3 Fatty Acids (FISH OIL) 1000 MG CPDR Take 1,000 mg by mouth 2 (two) times daily.     . promethazine (PHENERGAN) 12.5 MG tablet Take 1 tablet (12.5 mg total) by mouth every 6 (six) hours as needed for nausea or vomiting. 30 tablet 0  . Psyllium (METAMUCIL PO) Take 3 capsules by mouth daily.     . ramipril (ALTACE) 10 MG capsule Take 10 mg by mouth daily.     Marland Kitchen SYNTHROID 112 MCG tablet Take 112 mcg by mouth daily before breakfast.     . zolpidem (AMBIEN) 10 MG tablet Take 10 mg by mouth at bedtime as needed for sleep.     No current facility-administered medications for this visit.   Allergies:  Patient has no known allergies.   Social History: The patient  reports that he quit smoking about 18 years ago. His smoking use included cigarettes. He has a 7.50 pack-year smoking history. He has never used smokeless tobacco. He reports previous alcohol use of about 3.0  - 4.0 standard drinks of alcohol per week. He reports that he does not use drugs.   Family History: The patient's family history includes Healthy in his brother, sister, and son; Heart attack in his father; Heart disease in his father; Hyperlipidemia in his father; Hypertension in his father and mother; Prostate cancer in his father.   ROS:  Please see the history of present illness. Otherwise, complete review of systems is positive for none.  All other systems are reviewed and negative.   Physical Exam: VS:  BP 124/70   Pulse 66   Ht 6\' 1"  (1.854 m)   Wt 212 lb 3.2 oz (96.3 kg)   SpO2  96%   BMI 28.00 kg/m , BMI Body mass index is 28 kg/m.  Wt Readings from Last 3 Encounters:  05/21/20 212 lb 3.2 oz (96.3 kg)  10/24/19 212 lb (96.2 kg)  08/29/19 212 lb (96.2 kg)    General: Patient appears comfortable at rest. Neck: Supple, no elevated JVP or carotid bruits, no thyromegaly. Lungs: Clear to auscultation, nonlabored breathing at rest. Cardiac: Regular rate and rhythm, no S3 or significant systolic murmur, no pericardial rub. Extremities: No pitting edema, distal pulses 2+. Skin: Warm and dry. Musculoskeletal: No kyphosis. Neuropsychiatric: Alert and oriented x3, affect grossly appropriate.  ECG:  An ECG dated 05/21/2020 was personally reviewed today and demonstrated:  Sinus rhythm with a rate of 68.  Recent Labwork: 07/09/2019: BUN 20; Creatinine, Ser 1.05; Hemoglobin 14.4; Platelets 181; Potassium 4.5; Sodium 136     Component Value Date/Time   CHOL 125 03/19/2013 0902   TRIG 108 03/19/2013 0902   HDL 41 03/19/2013 0902   CHOLHDL 3.0 03/19/2013 0902   VLDL 22 03/19/2013 0902   LDLCALC 62 03/19/2013 0902    Other Studies Reviewed Today:  Carotid artery duplex study 10/24/2019  Right Carotid: Velocities in the right ICA are consistent with a 1-39% stenosis. Left Carotid: Velocities in the left ICA are consistent with a 1-39% stenosis. Vertebrals: Bilateral vertebral  arteries demonstrate antegrade flow. Subclavians: Normal flow hemodynamics were seen in bilateral subclavian arteries  Assessment and Plan:  1. Preoperative clearance   2. CAD in native artery   3. Essential hypertension   4. OSA on CPAP   5. Bilateral carotid artery stenosis    1. Preoperative clearance Patient is pending first MPJ arthrodesis under general anesthesia with Dr. Mayo Ao at [facility]. Office [phone]. Fax [phone]. Duke activity score index is 50.7 giving patient functional capacity of 8.97 METS.  Revised cardiac risk index 2 points putting the patient at a perioperative risk of major cardiac event at 6.6%.  From a cardiology standpoint patient is cleared to undergo right foot surgery under general anesthesia.  2. CAD in native artery History of CAD/MI status post CABG x3: LIMA-LAD, SVG-OM 2, SVG-PDA.  Denies any anginal or exertional symptoms. Continue aspirin 81 mg daily, metoprolol 50 mg p.o. twice daily.  Continue atorvastatin 40 mg daily.  3. Essential hypertension Blood pressure well controlled on current therapy.  Continue metoprolol 50 mg p.o. twice daily.  Ramipril 10 mg daily.  4. OSA on CPAP He is compliant with CPAP therapy.  States he wears it every night  5. Bilateral carotid artery stenosis Carotid duplex study 10/24/2019 bilateral 1 to 39% ICA stenosis. Bilateral vertebral arteries demonstrate antegrade flow.  No neurological deficits.  No CVA or TIA-like symptoms.  Medication Adjustments/Labs and Tests Ordered: Current medicines are reviewed at length with the patient today.  Concerns regarding medicines are outlined above.   Disposition: Follow-up with Dr. Harl Bowie or [facility] 1 year.  Signed, Levell July, NP 05/21/2020 2:30 PM    [facility], [place], [place] [address] Phone: [phone]; Fax: [phone]

## 2020-05-20 NOTE — Progress Notes (Signed)
   Follow up Visit  Subjective  [name] [name] is a 79 y.o. male who presents for the following: Annual Exam (concerns lesion on ear that pcp found. ). He has not been seen since 2019. Saw PCP recently and they thought he should be looked.   Objective  Well appearing patient in no apparent distress; mood and affect are within normal limits.  All skin waist up and legs examined. No suspicious moles noted on back.   Objective  Mid Back: Full body skin exam  Objective  Left Hand - Posterior (4), Right Cymba: Erythematous patches with gritty scale.  Right Postauricular Area     Objective  Left ear rim: Dyspigmented scar.   Assessment & Plan  Screening for malignant neoplasm of skin Mid Back  AK (actinic keratosis) (5) Left Hand - Posterior (4); Right Cymba  Destruction of lesion - Left Hand - Posterior, Right Cymba Complexity: simple   Destruction method: cryotherapy   Informed consent: discussed and consent obtained   Timeout:  patient name, date of birth, surgical site, and procedure verified Lesion destroyed using liquid nitrogen: Yes   Cryotherapy cycles:  1 Outcome: patient tolerated procedure well with no complications   Post-procedure details: wound care instructions given    Ordered Medications: fluorouracil (EFUDEX) 5 % cream  Neoplasm of uncertain behavior of skin Right Postauricular Area  Skin / nail biopsy Type of biopsy: tangential   Informed consent: discussed and consent obtained   Timeout: patient name, date of birth, surgical site, and procedure verified   Procedure prep:  Patient was prepped and draped in usual sterile fashion (Non sterile) Prep type:  Chlorhexidine Anesthesia: the lesion was anesthetized in a standard fashion   Anesthetic:  1% lidocaine w/ epinephrine 1-100,000 local infiltration Instrument used: flexible razor blade   Outcome: patient tolerated procedure well   Post-procedure details: wound care instructions given     Specimen 1 - Surgical pathology Differential Diagnosis: Atypia, SK Check Margins: No  History of squamous cell carcinoma Left ear rim

## 2020-05-21 ENCOUNTER — Ambulatory Visit (INDEPENDENT_AMBULATORY_CARE_PROVIDER_SITE_OTHER): Payer: Medicare HMO | Admitting: Family Medicine

## 2020-05-21 ENCOUNTER — Encounter: Payer: Self-pay | Admitting: Family Medicine

## 2020-05-21 VITALS — BP 124/70 | HR 66 | Ht 73.0 in | Wt 212.2 lb

## 2020-05-21 DIAGNOSIS — Z01818 Encounter for other preprocedural examination: Secondary | ICD-10-CM | POA: Diagnosis not present

## 2020-05-21 DIAGNOSIS — I6523 Occlusion and stenosis of bilateral carotid arteries: Secondary | ICD-10-CM | POA: Diagnosis not present

## 2020-05-21 DIAGNOSIS — G4733 Obstructive sleep apnea (adult) (pediatric): Secondary | ICD-10-CM | POA: Diagnosis not present

## 2020-05-21 DIAGNOSIS — Z9989 Dependence on other enabling machines and devices: Secondary | ICD-10-CM | POA: Diagnosis not present

## 2020-05-21 DIAGNOSIS — I2510 Atherosclerotic heart disease of native coronary artery without angina pectoris: Secondary | ICD-10-CM

## 2020-05-21 DIAGNOSIS — I10 Essential (primary) hypertension: Secondary | ICD-10-CM

## 2020-05-21 NOTE — Telephone Encounter (Signed)
Will forward to PA for appt today for pre op clearance. Will remove from the pre op call back pol.

## 2020-05-21 NOTE — Patient Instructions (Signed)

## 2020-06-07 DIAGNOSIS — G4733 Obstructive sleep apnea (adult) (pediatric): Secondary | ICD-10-CM | POA: Diagnosis not present

## 2020-06-15 DIAGNOSIS — G4733 Obstructive sleep apnea (adult) (pediatric): Secondary | ICD-10-CM | POA: Diagnosis not present

## 2020-06-23 DIAGNOSIS — F5101 Primary insomnia: Secondary | ICD-10-CM | POA: Diagnosis not present

## 2020-06-23 DIAGNOSIS — E782 Mixed hyperlipidemia: Secondary | ICD-10-CM | POA: Diagnosis not present

## 2020-06-23 DIAGNOSIS — N182 Chronic kidney disease, stage 2 (mild): Secondary | ICD-10-CM | POA: Diagnosis not present

## 2020-06-23 DIAGNOSIS — I129 Hypertensive chronic kidney disease with stage 1 through stage 4 chronic kidney disease, or unspecified chronic kidney disease: Secondary | ICD-10-CM | POA: Diagnosis not present

## 2020-06-23 DIAGNOSIS — E875 Hyperkalemia: Secondary | ICD-10-CM | POA: Diagnosis not present

## 2020-06-23 DIAGNOSIS — R7303 Prediabetes: Secondary | ICD-10-CM | POA: Diagnosis not present

## 2020-06-23 DIAGNOSIS — E039 Hypothyroidism, unspecified: Secondary | ICD-10-CM | POA: Diagnosis not present

## 2020-06-23 DIAGNOSIS — R7301 Impaired fasting glucose: Secondary | ICD-10-CM | POA: Diagnosis not present

## 2020-06-23 DIAGNOSIS — I10 Essential (primary) hypertension: Secondary | ICD-10-CM | POA: Diagnosis not present

## 2020-07-16 ENCOUNTER — Ambulatory Visit: Payer: Medicare HMO | Admitting: Neurology

## 2020-08-13 DIAGNOSIS — I129 Hypertensive chronic kidney disease with stage 1 through stage 4 chronic kidney disease, or unspecified chronic kidney disease: Secondary | ICD-10-CM | POA: Diagnosis not present

## 2020-08-13 DIAGNOSIS — I10 Essential (primary) hypertension: Secondary | ICD-10-CM | POA: Diagnosis not present

## 2020-08-13 DIAGNOSIS — E875 Hyperkalemia: Secondary | ICD-10-CM | POA: Diagnosis not present

## 2020-08-13 DIAGNOSIS — R7303 Prediabetes: Secondary | ICD-10-CM | POA: Diagnosis not present

## 2020-08-13 DIAGNOSIS — R7301 Impaired fasting glucose: Secondary | ICD-10-CM | POA: Diagnosis not present

## 2020-08-13 DIAGNOSIS — N182 Chronic kidney disease, stage 2 (mild): Secondary | ICD-10-CM | POA: Diagnosis not present

## 2020-08-13 DIAGNOSIS — F5101 Primary insomnia: Secondary | ICD-10-CM | POA: Diagnosis not present

## 2020-08-13 DIAGNOSIS — E782 Mixed hyperlipidemia: Secondary | ICD-10-CM | POA: Diagnosis not present

## 2020-08-13 DIAGNOSIS — E039 Hypothyroidism, unspecified: Secondary | ICD-10-CM | POA: Diagnosis not present

## 2020-08-23 ENCOUNTER — Encounter (INDEPENDENT_AMBULATORY_CARE_PROVIDER_SITE_OTHER): Payer: Self-pay | Admitting: Otolaryngology

## 2020-08-23 ENCOUNTER — Ambulatory Visit (INDEPENDENT_AMBULATORY_CARE_PROVIDER_SITE_OTHER): Payer: Medicare HMO | Admitting: Otolaryngology

## 2020-08-23 ENCOUNTER — Other Ambulatory Visit: Payer: Self-pay

## 2020-08-23 VITALS — Temp 97.5°F

## 2020-08-23 DIAGNOSIS — T162XXA Foreign body in left ear, initial encounter: Secondary | ICD-10-CM | POA: Diagnosis not present

## 2020-08-23 DIAGNOSIS — H6612 Chronic tubotympanic suppurative otitis media, left ear: Secondary | ICD-10-CM

## 2020-08-23 NOTE — Progress Notes (Signed)
HPI: [name] [name] is a 80 y.o. male who returns today for evaluation of longstanding retained left myringotomy tube.  He desires to have this removed as this has been present for over 10 years.  He has had chronic problems with intermittent drainage from this ear.  Most recently has tried Ciprodex drops which did not seem to help that much..  Past Medical History:  Diagnosis Date  . Arthritis   . CAD (coronary artery disease)   . CAD (coronary artery disease) 12/04/2011   Left Main Disease with 3- vessel CAD   . CAD (coronary artery disease)    seeing Dr Harl Bowie  . CHF (congestive heart failure) ([place])   . Deviated septum   . Dyslipidemia   . GERD (gastroesophageal reflux disease)   . Heart attack ([place])   . Hyperlipidemia   . Hypertension   . Hypothyroidism   . MI (myocardial infarction) ([place]) 1997  . Neuropathy   . Obesity (BMI 30-39.9) 09/02/2018  . PONV (postoperative nausea and vomiting)    has in past, no problems after most recent surgery  . 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 S/P carotid endarterectomy 02/29/2012   Right CEA for asymptomatic severe right ICA stenosis   . 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 Shortness of breath    with exertion  . Sleep apnea    CPAP, sleep study at [facility]  . 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   80-99% stenosis RICA, asymptomatic  . Thyroid nodule   . Wears glasses    or contacts   Past Surgical History:  Procedure Laterality Date  . ADENOIDECTOMY    . CARDIAC CATHETERIZATION     02/22/12  . CARDIOVASCULAR STRESS TEST  7/13  . CAROTID ENDARTERECTOMY    . COLONOSCOPY  15-20years and 12/04/11   Done at [place]  12/11/2011   Procedure: COLONOSCOPY;  Surgeon: Rogene Houston, MD;  [facility];  Service: Endoscopy;  Laterality: N/A;  830  . CORONARY ARTERY BYPASS GRAFT  02/29/2012   Procedure: CORONARY ARTERY  BYPASS GRAFTING (CABG);  Surgeon: Rexene Alberts, MD;  Location: [place];  Service: Open Heart Surgery;  Laterality: N/A;  times three using Left Internal Mammary Artery and Right Greater Saphenous Vein Graft Harvested Endoscopically  . CORONARY STENT PLACEMENT    . ENDARTERECTOMY  02/29/2012   Procedure: ENDARTERECTOMY CAROTID;  Surgeon: Angelia Mould, MD;  Location: [place];  Service: Vascular;  Laterality: Right;  . FOOT SURGERY  06/30/11   artificial joint in big toe right foot   . KNEE ARTHROSCOPY WITH MEDIAL MENISECTOMY Left 07/15/2019   Procedure: KNEE ARTHROSCOPY WITH MEDIAL MENISCECTOMY AND LATERAL MENISCECTOMY;  Surgeon: Carole Civil, MD;  [facility];  Service: Orthopedics;  Laterality: Left;  . LEFT HEART CATHETERIZATION WITH CORONARY ANGIOGRAM N/A 02/20/2012   Procedure: LEFT HEART CATHETERIZATION WITH CORONARY ANGIOGRAM;  Surgeon: Troy Sine, MD;  Location: [facility];  Service: Cardiovascular;  Laterality: N/A;  . NASAL SEPTUM SURGERY    . Totol thyroidectomy  02/06/2011  . TRANSESOPHAGEAL ECHOCARDIOGRAM    . UPPER GASTROINTESTINAL ENDOSCOPY  35-40 years ago   [place] History   Socioeconomic History  . Marital status: Married    Spouse name: Velva Harman  . Number of children: 1  . Years of  education: 78  . Highest education level: Not on file  Occupational History    Comment: retired  Tobacco Use  . Smoking status: Former Smoker    Packs/day: 0.50    Years: 15.00    Pack years: 7.50    Types: Cigarettes    Quit date: 09/04/2001    Years since quitting: 18.9  . Smokeless tobacco: Never Used  . Tobacco comment: smoked 81yrs, smoked 1/2 pack a day  Vaping Use  . Vaping Use: Never used  Substance and Sexual Activity  . Alcohol use: Not Currently    Alcohol/week: 3.0 - 4.0 standard drinks    Types: 3 - 4 Cans of beer per week    Comment: social- beer , not on regular basis  . Drug use: No  . Sexual activity: Yes  Other Topics Concern  .  Not on file  Social History Narrative   Consumes 2-3 cups of caffeine daily   Social Determinants of Health   Financial Resource Strain: Not on file  Food Insecurity: Not on file  Transportation Needs: Not on file  Physical Activity: Not on file  Stress: Not on file  Social Connections: Not on file   Family History  Problem Relation Age of Onset  . Hypertension Mother   . Healthy Sister   . Healthy Brother   . Healthy Son   . Heart attack Father   . Heart disease Father        Before age 21  . Hyperlipidemia Father   . Hypertension Father   . Prostate cancer Father   . Colon cancer Neg Hx    No Known Allergies Prior to Admission medications   Medication Sig Start Date End Date Taking? Authorizing Provider  aspirin EC 81 MG tablet Take 81 mg by mouth daily.    [provider]  atorvastatin (LIPITOR) 40 MG tablet TAKE 1 TABLET EVERY DAY (NEED APPOINTMENT FOR FURTHER REFILLS) 08/18/19   Antoine Poche, MD  diclofenac (VOLTAREN) 75 MG EC tablet TAKE (1) TABLET BY MOUTH TWICE DAILY. 10/22/19   Vickki Hearing, MD  Esomeprazole Magnesium (NEXIUM PO) Take 20 mg by mouth daily.  08/19/14   [provider]  fluorouracil (EFUDEX) 5 % cream Apply topically at bedtime. 05/20/20   Clark-Burning, Victorino Dike, PA-C  metoprolol (LOPRESSOR) 50 MG tablet Take 1 tablet (50 mg total) by mouth 2 (two) times daily. 09/23/13   Antoine Poche, MD  Multiple Vitamins-Minerals (PRESERVISION AREDS 2 PO) Take 1 tablet by mouth 2 (two) times daily.     [provider]  Omega-3 Fatty Acids (FISH OIL) 1000 MG CPDR Take 1,000 mg by mouth 2 (two) times daily.     [provider]  promethazine (PHENERGAN) 12.5 MG tablet Take 1 tablet (12.5 mg total) by mouth every 6 (six) hours as needed for nausea or vomiting. 07/15/19   Vickki Hearing, MD  Psyllium (METAMUCIL PO) Take 3 capsules by mouth daily.     [provider]  ramipril (ALTACE) 10 MG capsule Take 10 mg  by mouth daily.  03/03/13   [provider]  SYNTHROID 112 MCG tablet Take 112 mcg by mouth daily before breakfast.  08/15/18   [provider]  zolpidem (AMBIEN) 10 MG tablet Take 10 mg by mouth at bedtime as needed for sleep.    [provider]     Positive ROS: Otherwise negative  All other systems have been reviewed and were otherwise negative with the  exception of those mentioned in the HPI and as above.  Physical Exam: Constitutional: Alert, well-appearing, no acute distress Ears: External ears without lesions or tenderness.  He has small ear canals bilaterally.  Left ear canal with active drainage from retained myringotomy tube.  He has some granulation tissue around the myringotomy tube.  This was subsequently removed in the office with forceps.  He had a moderate size perforation inferiorly on the TM after removal with some bleeding from the granulation tissue.  After cleaning this I applied CSF powder and Floxin eardrops to the left ear. Nasal: External nose without lesions. Clear nasal passages Oral: Lips and gums without lesions. Tongue and palate mucosa without lesions. Posterior oropharynx clear. Neck: No palpable adenopathy or masses Respiratory: Breathing comfortably  Skin: No facial/neck lesions or rash noted.  Procedures  Assessment: Retained left ear myringotomy tube for over 10 years.  This was removed in the office.  The tube had a very broad base beneath the TM.  A large amount of granulation tissue around the tube.  He was active being active drainage from the middle ear space which has had for a while.  Plan: Placed him on TobraDex ophthalmic drops for the left ear twice daily for the next week.  He will follow-up in 2 days for recheck and cleaning the ear canal as he had moderate bleeding.   Radene Journey, MD

## 2020-08-24 ENCOUNTER — Ambulatory Visit: Payer: Medicare HMO | Admitting: Cardiology

## 2020-08-25 ENCOUNTER — Other Ambulatory Visit: Payer: Self-pay

## 2020-08-25 ENCOUNTER — Ambulatory Visit (INDEPENDENT_AMBULATORY_CARE_PROVIDER_SITE_OTHER): Payer: Medicare HMO | Admitting: Otolaryngology

## 2020-08-25 DIAGNOSIS — H7292 Unspecified perforation of tympanic membrane, left ear: Secondary | ICD-10-CM

## 2020-08-25 NOTE — Progress Notes (Signed)
HPI: [name] [name] is a 80 y.o. male who returns today for evaluation of left ear status post removal of a large T-tube from the inferior left TM that been present for 15 years.  He had some bleeding for the first day but has had no further bleeding today.  He has been using the TobraDex drops that he started yesterday.  He also wanted his right ear checked and cleaned..  Past Medical History:  Diagnosis Date  . Arthritis   . CAD (coronary artery disease)   . CAD (coronary artery disease) 12/04/2011   Left Main Disease with 3- vessel CAD   . CAD (coronary artery disease)    seeing Dr Harl Bowie  . CHF (congestive heart failure) ([place])   . Deviated septum   . Dyslipidemia   . GERD (gastroesophageal reflux disease)   . Heart attack ([place])   . Hyperlipidemia   . Hypertension   . Hypothyroidism   . MI (myocardial infarction) ([place]) 1997  . Neuropathy   . Obesity (BMI 30-39.9) 09/02/2018  . PONV (postoperative nausea and vomiting)    has in past, no problems after most recent surgery  . 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 S/P carotid endarterectomy 02/29/2012   Right CEA for asymptomatic severe right ICA stenosis   . 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 Shortness of breath    with exertion  . Sleep apnea    CPAP, [facility]  . 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   80-99% stenosis RICA, asymptomatic  . Thyroid nodule   . Wears glasses    or contacts   Past Surgical History:  Procedure Laterality Date  . ADENOIDECTOMY    . CARDIAC CATHETERIZATION     02/22/12  . CARDIOVASCULAR STRESS TEST  7/13  . CAROTID ENDARTERECTOMY    . COLONOSCOPY  15-20years and 12/04/11   Done at [place]  12/11/2011   Procedure: COLONOSCOPY;  Surgeon: Rogene Houston, MD;  [facility];  Service: Endoscopy;  Laterality: N/A;  830  . CORONARY ARTERY BYPASS GRAFT  02/29/2012    Procedure: CORONARY ARTERY BYPASS GRAFTING (CABG);  Surgeon: Rexene Alberts, MD;  Location: [place];  Service: Open Heart Surgery;  Laterality: N/A;  times three using Left Internal Mammary Artery and Right Greater Saphenous Vein Graft Harvested Endoscopically  . CORONARY STENT PLACEMENT    . ENDARTERECTOMY  02/29/2012   Procedure: ENDARTERECTOMY CAROTID;  Surgeon: Angelia Mould, MD;  Location: [place];  Service: Vascular;  Laterality: Right;  . FOOT SURGERY  06/30/11   artificial joint in big toe right foot   . KNEE ARTHROSCOPY WITH MEDIAL MENISECTOMY Left 07/15/2019   Procedure: KNEE ARTHROSCOPY WITH MEDIAL MENISCECTOMY AND LATERAL MENISCECTOMY;  Surgeon: Carole Civil, MD;  [facility];  Service: Orthopedics;  Laterality: Left;  . LEFT HEART CATHETERIZATION WITH CORONARY ANGIOGRAM N/A 02/20/2012   Procedure: LEFT HEART CATHETERIZATION WITH CORONARY ANGIOGRAM;  Surgeon: Troy Sine, MD;  Location: [facility] CATH LAB;  Service: Cardiovascular;  Laterality: N/A;  . NASAL SEPTUM SURGERY    . Totol thyroidectomy  02/06/2011  . TRANSESOPHAGEAL ECHOCARDIOGRAM    . UPPER GASTROINTESTINAL ENDOSCOPY  35-40 years ago   [place] History   Socioeconomic History  . Marital status: Married    Spouse name: Velva Harman  .  Number of children: 1  . Years of education: 47  . Highest education level: Not on file  Occupational History    Comment: retired  Tobacco Use  . Smoking status: Former Smoker    Packs/day: 0.50    Years: 15.00    Pack years: 7.50    Types: Cigarettes    Quit date: 09/04/2001    Years since quitting: 18.9  . Smokeless tobacco: Never Used  . Tobacco comment: smoked 41yrs, smoked 1/2 pack a day  Vaping Use  . Vaping Use: Never used  Substance and Sexual Activity  . Alcohol use: Not Currently    Alcohol/week: 3.0 - 4.0 standard drinks    Types: 3 - 4 Cans of beer per week    Comment: social- beer , not on regular basis  . Drug use: No  . Sexual activity: Yes   Other Topics Concern  . Not on file  Social History Narrative   Consumes 2-3 cups of caffeine daily   Social Determinants of Health   Financial Resource Strain: Not on file  Food Insecurity: Not on file  Transportation Needs: Not on file  Physical Activity: Not on file  Stress: Not on file  Social Connections: Not on file   Family History  Problem Relation Age of Onset  . Hypertension Mother   . Healthy Sister   . Healthy Brother   . Healthy Son   . Heart attack Father   . Heart disease Father        Before age 3  . Hyperlipidemia Father   . Hypertension Father   . Prostate cancer Father   . Colon cancer Neg Hx    No Known Allergies Prior to Admission medications   Medication Sig Start Date End Date Taking? Authorizing Provider  aspirin EC 81 MG tablet Take 81 mg by mouth daily.    [provider]  atorvastatin (LIPITOR) 40 MG tablet TAKE 1 TABLET EVERY DAY (NEED APPOINTMENT FOR FURTHER REFILLS) 08/18/19   Arnoldo Lenis, MD  diclofenac (VOLTAREN) 75 MG EC tablet TAKE (1) TABLET BY MOUTH TWICE DAILY. 10/22/19   Carole Civil, MD  Esomeprazole Magnesium (NEXIUM PO) Take 20 mg by mouth daily.  08/19/14   [provider]  fluorouracil (EFUDEX) 5 % cream Apply topically at bedtime. 05/20/20   Clark-Burning, Anderson Malta, PA-C  metoprolol (LOPRESSOR) 50 MG tablet Take 1 tablet (50 mg total) by mouth 2 (two) times daily. 09/23/13   Arnoldo Lenis, MD  Multiple Vitamins-Minerals (PRESERVISION AREDS 2 PO) Take 1 tablet by mouth 2 (two) times daily.     [provider]  Omega-3 Fatty Acids (FISH OIL) 1000 MG CPDR Take 1,000 mg by mouth 2 (two) times daily.     [provider]  promethazine (PHENERGAN) 12.5 MG tablet Take 1 tablet (12.5 mg total) by mouth every 6 (six) hours as needed for nausea or vomiting. 07/15/19   Carole Civil, MD  Psyllium (METAMUCIL PO) Take 3 capsules by mouth daily.     [provider]  ramipril (ALTACE)  10 MG capsule Take 10 mg by mouth daily.  03/03/13   [provider]  SYNTHROID 112 MCG tablet Take 112 mcg by mouth daily before breakfast.  08/15/18   [provider]  zolpidem (AMBIEN) 10 MG tablet Take 10 mg by mouth at bedtime as needed for sleep.    [provider]     Positive ROS: Otherwise negative  All other systems have  been reviewed and were otherwise negative with the exception of those mentioned in the HPI and as above.  Physical Exam: Constitutional: Alert, well-appearing, no acute distress Ears: External ears without lesions or tenderness.  Left ear canal is clear with no active blood clot and no active bleeding.  Applied CSF powder to the left ear.  The right ear canal had minimal cerumen that was removed with curette and the right TM is clear. Nasal: External nose without lesions. Clear nasal passages Oral: Lips and gums without lesions. Tongue and palate mucosa without lesions. Posterior oropharynx clear. Neck: No palpable adenopathy or masses Respiratory: Breathing comfortably  Skin: No facial/neck lesions or rash noted.  Procedures  Assessment: Status post removal of right T-tube with right TM perforation and granulation tissue no active drainage noted today.  Plan: Recommend continuation with a eardrops for the next 5 days and will follow-up in 3 to 4 weeks for recheck   Narda Bonds, MD

## 2020-09-11 DIAGNOSIS — I129 Hypertensive chronic kidney disease with stage 1 through stage 4 chronic kidney disease, or unspecified chronic kidney disease: Secondary | ICD-10-CM | POA: Diagnosis not present

## 2020-09-11 DIAGNOSIS — E039 Hypothyroidism, unspecified: Secondary | ICD-10-CM | POA: Diagnosis not present

## 2020-09-11 DIAGNOSIS — R7301 Impaired fasting glucose: Secondary | ICD-10-CM | POA: Diagnosis not present

## 2020-09-11 DIAGNOSIS — I10 Essential (primary) hypertension: Secondary | ICD-10-CM | POA: Diagnosis not present

## 2020-09-11 DIAGNOSIS — R7303 Prediabetes: Secondary | ICD-10-CM | POA: Diagnosis not present

## 2020-09-11 DIAGNOSIS — E875 Hyperkalemia: Secondary | ICD-10-CM | POA: Diagnosis not present

## 2020-09-11 DIAGNOSIS — F5101 Primary insomnia: Secondary | ICD-10-CM | POA: Diagnosis not present

## 2020-09-11 DIAGNOSIS — N182 Chronic kidney disease, stage 2 (mild): Secondary | ICD-10-CM | POA: Diagnosis not present

## 2020-09-11 DIAGNOSIS — E782 Mixed hyperlipidemia: Secondary | ICD-10-CM | POA: Diagnosis not present

## 2020-09-22 ENCOUNTER — Other Ambulatory Visit: Payer: Self-pay

## 2020-09-22 ENCOUNTER — Ambulatory Visit (INDEPENDENT_AMBULATORY_CARE_PROVIDER_SITE_OTHER): Payer: Medicare HMO | Admitting: Otolaryngology

## 2020-09-22 VITALS — Temp 97.7°F

## 2020-09-22 DIAGNOSIS — H7292 Unspecified perforation of tympanic membrane, left ear: Secondary | ICD-10-CM | POA: Diagnosis not present

## 2020-09-22 NOTE — Progress Notes (Signed)
HPI: [name] [name] is a 80 y.o. male who returns today for evaluation of left ear.  He had a previous T-tube placed in his left ear over 15 years ago in [place].  He had been having recurrent drainage from the ear and the T-tube was eventually removed about a month ago.  He had been treated with TobraDex eardrops which seemed to help the most.  He is not having any drainage presently but follows up for recheck..  Past Medical History:  Diagnosis Date  . Arthritis   . CAD (coronary artery disease)   . CAD (coronary artery disease) 12/04/2011   Left Main Disease with 3- vessel CAD   . CAD (coronary artery disease)    seeing Dr Harl Bowie  . CHF (congestive heart failure) ([place])   . Deviated septum   . Dyslipidemia   . GERD (gastroesophageal reflux disease)   . Heart attack ([place])   . Hyperlipidemia   . Hypertension   . Hypothyroidism   . MI (myocardial infarction) ([place]) 1997  . Neuropathy   . Obesity (BMI 30-39.9) 09/02/2018  . PONV (postoperative nausea and vomiting)    has in past, no problems after most recent surgery  . 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 S/P carotid endarterectomy 02/29/2012   Right CEA for asymptomatic severe right ICA stenosis   . 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 Shortness of breath    with exertion  . Sleep apnea    CPAP, [facility] at [facility]  . 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   80-99% stenosis RICA, asymptomatic  . Thyroid nodule   . Wears glasses    or contacts   Past Surgical History:  Procedure Laterality Date  . ADENOIDECTOMY    . CARDIAC CATHETERIZATION     02/22/12  . CARDIOVASCULAR STRESS TEST  7/13  . CAROTID ENDARTERECTOMY    . COLONOSCOPY  15-20years and 12/04/11   Done at [place]  12/11/2011   Procedure: COLONOSCOPY;  Surgeon: Rogene Houston, MD;  [facility];  Service: Endoscopy;  Laterality: N/A;   830  . CORONARY ARTERY BYPASS GRAFT  02/29/2012   Procedure: CORONARY ARTERY BYPASS GRAFTING (CABG);  Surgeon: Rexene Alberts, MD;  Location: [place];  Service: Open Heart Surgery;  Laterality: N/A;  times three using Left Internal Mammary Artery and Right Greater Saphenous Vein Graft Harvested Endoscopically  . CORONARY STENT PLACEMENT    . ENDARTERECTOMY  02/29/2012   Procedure: ENDARTERECTOMY CAROTID;  Surgeon: Angelia Mould, MD;  Location: [place];  Service: Vascular;  Laterality: Right;  . FOOT SURGERY  06/30/11   artificial joint in big toe right foot   . KNEE ARTHROSCOPY WITH MEDIAL MENISECTOMY Left 07/15/2019   Procedure: KNEE ARTHROSCOPY WITH MEDIAL MENISCECTOMY AND LATERAL MENISCECTOMY;  Surgeon: Carole Civil, MD;  [facility];  Service: Orthopedics;  Laterality: Left;  . LEFT HEART CATHETERIZATION WITH CORONARY ANGIOGRAM N/A 02/20/2012   Procedure: LEFT HEART CATHETERIZATION WITH CORONARY ANGIOGRAM;  Surgeon: Troy Sine, MD;  Location[facility] C[facility] LAB;  Service: Cardiovascular;  Laterality: N/A;  . NASAL SEPTUM SURGERY    . Totol thyroidectomy  02/06/2011  . TRANSESOPHAGEAL ECHOCARDIOGRAM    . UPPER GASTROINTESTINAL ENDOSCOPY  35-40 years ago   [place] History   Socioeconomic History  .  Marital status: Married    Spouse name: [name] [name]  . Number of children: 1  . Years of education: 69  . Highest education level: Not on file  Occupational History    Comment: retired  Tobacco Use  . Smoking status: Former Smoker    Packs/day: 0.50    Years: 15.00    Pack years: 7.50    Types: Cigarettes    Quit date: 09/04/2001    Years since quitting: 19.0  . Smokeless tobacco: Never Used  . Tobacco comment: smoked 25yrs, smoked 1/2 pack a day  Vaping Use  . Vaping Use: Never used  Substance and Sexual Activity  . Alcohol use: Not Currently    Alcohol/week: 3.0 - 4.0 standard drinks    Types: 3 - 4 Cans of beer per week    Comment: social- beer , not on  regular basis  . Drug use: No  . Sexual activity: Yes  Other Topics Concern  . Not on file  Social History Narrative   Consumes 2-3 cups of caffeine daily   Social Determinants of Health   Financial Resource Strain: Not on file  Food Insecurity: Not on file  Transportation Needs: Not on file  Physical Activity: Not on file  Stress: Not on file  Social Connections: Not on file   Family History  Problem Relation Age of Onset  . Hypertension Mother   . Healthy Sister   . Healthy Brother   . Healthy Son   . Heart attack Father   . Heart disease Father        Before age 74  . Hyperlipidemia Father   . Hypertension Father   . Prostate cancer Father   . Colon cancer Neg Hx    No Known Allergies Prior to Admission medications   Medication Sig Start Date End Date Taking? Authorizing Provider  aspirin EC 81 MG tablet Take 81 mg by mouth daily.    [provider]  atorvastatin (LIPITOR) 40 MG tablet TAKE 1 TABLET EVERY DAY (NEED APPOINTMENT FOR FURTHER REFILLS) 08/18/19   Arnoldo Lenis, MD  diclofenac (VOLTAREN) 75 MG EC tablet TAKE (1) TABLET BY MOUTH TWICE DAILY. 10/22/19   Carole Civil, MD  Esomeprazole Magnesium (NEXIUM PO) Take 20 mg by mouth daily.  08/19/14   [provider]  fluorouracil (EFUDEX) 5 % cream Apply topically at bedtime. 05/20/20   Clark-Burning, Anderson Malta, PA-C  metoprolol (LOPRESSOR) 50 MG tablet Take 1 tablet (50 mg total) by mouth 2 (two) times daily. 09/23/13   Arnoldo Lenis, MD  Multiple Vitamins-Minerals (PRESERVISION AREDS 2 PO) Take 1 tablet by mouth 2 (two) times daily.     [provider]  Omega-3 Fatty Acids (FISH OIL) 1000 MG CPDR Take 1,000 mg by mouth 2 (two) times daily.     [provider]  promethazine (PHENERGAN) 12.5 MG tablet Take 1 tablet (12.5 mg total) by mouth every 6 (six) hours as needed for nausea or vomiting. 07/15/19   Carole Civil, MD  Psyllium (METAMUCIL PO) Take 3 capsules by mouth  daily.     [provider]  ramipril (ALTACE) 10 MG capsule Take 10 mg by mouth daily.  03/03/13   [provider]  SYNTHROID 112 MCG tablet Take 112 mcg by mouth daily before breakfast.  08/15/18   [provider]  zolpidem (AMBIEN) 10 MG tablet Take 10 mg by mouth at bedtime as needed for sleep.    [provider]  Positive ROS: Otherwise negative  All other systems have been reviewed and were otherwise negative with the exception of those mentioned in the HPI and as above.  Physical Exam: Constitutional: Alert, well-appearing, no acute distress Ears: External ears without lesions or tenderness.  Right ear canal and right TM are clear.  Left ear canal reveals debris adjacent to the perforation site that was removed with suction.  He has a clear perforation site with no active bleeding noted presently.  After cleaning the ear canal with suction I applied CSF powder to the left ear and perforation site.  Of note he has small ear canals bilaterally. Nasal: External nose without lesions.. Clear nasal passages Oral: Lips and gums without lesions. Tongue and palate mucosa without lesions. Posterior oropharynx clear. Neck: No palpable adenopathy or masses Respiratory: Breathing comfortably  Skin: No facial/neck lesions or rash noted.  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 If he develops any drainage from the ear recommend use of TobraDex 4 drops twice daily for 5 days and gave him a prescription for refill. He will follow-up in 3 months for recheck.  He will return earlier if he has any problems.   Radene Journey, MD

## 2020-10-04 ENCOUNTER — Ambulatory Visit (INDEPENDENT_AMBULATORY_CARE_PROVIDER_SITE_OTHER): Payer: Medicare HMO | Admitting: Neurology

## 2020-10-04 ENCOUNTER — Other Ambulatory Visit: Payer: Self-pay

## 2020-10-04 ENCOUNTER — Encounter: Payer: Self-pay | Admitting: Neurology

## 2020-10-04 VITALS — BP 130/75 | HR 63 | Ht 73.0 in | Wt 212.0 lb

## 2020-10-04 DIAGNOSIS — R202 Paresthesia of skin: Secondary | ICD-10-CM | POA: Diagnosis not present

## 2020-10-04 DIAGNOSIS — G5622 Lesion of ulnar nerve, left upper limb: Secondary | ICD-10-CM | POA: Diagnosis not present

## 2020-10-04 DIAGNOSIS — M48061 Spinal stenosis, lumbar region without neurogenic claudication: Secondary | ICD-10-CM

## 2020-10-04 DIAGNOSIS — R292 Abnormal reflex: Secondary | ICD-10-CM

## 2020-10-04 NOTE — Progress Notes (Signed)
[name] [name] - Initial Visit   Date: 10/04/20  [name] [name] MRN: [id number] DOB: 11-13-1940   Dear Dr. Nevada Crane:  Thank you for your kind referral of [name] [name] for consultation of neuropathy. Although his history is well known to you, please allow [place] to reiterate it for the purpose of our medical record. The patient was accompanied to the clinic by self.    History of Present Illness: [name] [name] is a 80 y.o. right-handed male with CAD s/p CABG, CHF, HTN, HPL, and s/p R CEA presenting for evaluation of bilateral feet and leg pain.  He underwent CABG in 2013 and following this. he began having numbness, tingling, in the feet which has slowly extended into his lower legs.  Symptoms are constant with no exacerbating or alleviating factors.  He denies weakness.  He has some imbalance, no falls, and walks unassisted.  He was previously evaluated at [facility] and had NCS/EMG of the legs which was normal.  He was being treated symptomatically with gabapentin, Lyrica, and Horizant, which did not help.  He opted not to take any medication and is here for second opinion.  He denies low back pain, cramps, or radiculopathy pain.  No history of diabetes or alcohol.  He also has tingling in the right hand, which has been long standing.  He notices this more when sleeping or resting on the arm when watching TV.   Out-side paper records, electronic medical record, and images have been reviewed where available and summarized as:  NCS/EMG of the legs 10/05/2014:  Normal  Lab Results  Component Value Date   HGBA1C 5.9 (H) 02/28/2012   Lab Results  Component Value Date   VITAMINB12 >2000 (H) 09/24/2014   Lab Results  Component Value Date   TSH 1.024 03/19/2013   Lab Results  Component Value Date   ESRSEDRATE 2 09/24/2014    Past Medical History:  Diagnosis Date  . Arthritis   . CAD (coronary artery disease)   . CAD (coronary artery disease)  12/04/2011   Left Main Disease with 3- vessel CAD   . CAD (coronary artery disease)    seeing Dr Harl Bowie  . CHF (congestive heart failure) ([place])   . Deviated septum   . Dyslipidemia   . GERD (gastroesophageal reflux disease)   . Heart attack ([place])   . Hyperlipidemia   . Hypertension   . Hypothyroidism   . MI (myocardial infarction) ([place]) 1997  . Neuropathy   . Obesity (BMI 30-39.9) 09/02/2018  . PONV (postoperative nausea and vomiting)    has in past, no problems after most recent surgery  . 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 S/P carotid endarterectomy 02/29/2012   Right CEA for asymptomatic severe right ICA stenosis   . 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 Shortness of breath    with exertion  . Sleep apnea    CPAP, sleep study at [facility]  . 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   80-99% stenosis RICA, asymptomatic  . Thyroid nodule   . Wears glasses    or contacts    Past Surgical History:  Procedure Laterality Date  . ADENOIDECTOMY    . CARDIAC CATHETERIZATION     02/22/12  . CARDIOVASCULAR STRESS TEST  7/13  . CAROTID ENDARTERECTOMY    . COLONOSCOPY  15-20years and 12/04/11  Done at [facility]  . COLONOSCOPY  12/11/2011   Procedure: COLONOSCOPY;  Surgeon: Rogene Houston, MD;  Location: [facility];  Service: Endoscopy;  Laterality: N/A;  830  . CORONARY ARTERY BYPASS GRAFT  02/29/2012   Procedure: CORONARY ARTERY BYPASS GRAFTING (CABG);  Surgeon: Rexene Alberts, MD;  Location: [place];  Service: Open Heart Surgery;  Laterality: N/A;  times three using Left Internal Mammary Artery and Right Greater Saphenous Vein Graft Harvested Endoscopically  . CORONARY STENT PLACEMENT    . ENDARTERECTOMY  02/29/2012   Procedure: ENDARTERECTOMY CAROTID;  Surgeon: Angelia Mould, MD;  Location: [place];  Service: Vascular;  Laterality: Right;  . FOOT SURGERY  06/30/11   artificial joint  in big toe right foot   . KNEE ARTHROSCOPY WITH MEDIAL MENISECTOMY Left 07/15/2019   Procedure: KNEE ARTHROSCOPY WITH MEDIAL MENISCECTOMY AND LATERAL MENISCECTOMY;  Surgeon: Carole Civil, MD;  Location: [facility];  Service: Orthopedics;  Laterality: Left;  . LEFT HEART CATHETERIZATION WITH CORONARY ANGIOGRAM N/A 02/20/2012   Procedure: LEFT HEART CATHETERIZATION WITH CORONARY ANGIOGRAM;  Surgeon: Troy Sine, MD;  Location: [facility] CATH LAB;  Service: Cardiovascular;  Laterality: N/A;  . NASAL SEPTUM SURGERY    . [place] thyroidectomy  02/06/2011  . TRANSESOPHAGEAL ECHOCARDIOGRAM    . UPPER GASTROINTESTINAL ENDOSCOPY  35-40 years ago   [facility]     Medications:  Outpatient Encounter Medications as of 10/04/2020  Medication Sig  . aspirin EC 81 MG tablet Take 81 mg by mouth daily.  Marland Kitchen atorvastatin (LIPITOR) 40 MG tablet TAKE 1 TABLET EVERY DAY (NEED APPOINTMENT FOR FURTHER REFILLS)  . diclofenac (VOLTAREN) 75 MG EC tablet TAKE (1) TABLET BY MOUTH TWICE DAILY.  Marland Kitchen Esomeprazole Magnesium (NEXIUM PO) Take 20 mg by mouth daily.   . fluorouracil (EFUDEX) 5 % cream Apply topically at bedtime.  . metoprolol (LOPRESSOR) 50 MG tablet Take 1 tablet (50 mg total) by mouth 2 (two) times daily.  . Multiple Vitamins-Minerals (PRESERVISION AREDS 2 PO) Take 1 tablet by mouth 2 (two) times daily.   . Omega-3 Fatty Acids (FISH OIL) 1000 MG CPDR Take 1,000 mg by mouth 2 (two) times daily.   . promethazine (PHENERGAN) 12.5 MG tablet Take 1 tablet (12.5 mg total) by mouth every 6 (six) hours as needed for nausea or vomiting.  . Psyllium (METAMUCIL PO) Take 3 capsules by mouth daily.   . ramipril (ALTACE) 10 MG capsule Take 10 mg by mouth daily.   Marland Kitchen SYNTHROID 112 MCG tablet Take 112 mcg by mouth daily before breakfast.   . zolpidem (AMBIEN) 10 MG tablet Take 10 mg by mouth at bedtime as needed for sleep.   No facility-administered encounter medications on file as of 10/04/2020.    Allergies: No Known  Allergies  Family History: Family History  Problem Relation Age of Onset  . Hypertension Mother   . Healthy Sister   . Healthy Brother   . Healthy Son   . Heart attack Father   . Heart disease Father        Before age 62  . Hyperlipidemia Father   . Hypertension Father   . Prostate cancer Father   . Colon cancer Neg Hx     Social History: Social History   Tobacco Use  . Smoking status: Former Smoker    Packs/day: 0.50    Years: 15.00    Pack years: 7.50    Types: Cigarettes    Quit date: 09/04/2001  Years since quitting: 19.0  . Smokeless tobacco: Never Used  . Tobacco comment: smoked 86yrs, smoked 1/2 pack a day  Vaping Use  . Vaping Use: Never used  Substance Use Topics  . Alcohol use: Not Currently    Alcohol/week: 3.0 - 4.0 standard drinks    Types: 3 - 4 Cans of beer per week    Comment: social- beer , not on regular basis  . Drug use: No   Social History   Social History Narrative   Consumes 2-3 cups of caffeine daily    Vital Signs:  BP 130/75 (BP Location: Left Arm, Patient Position: Sitting)   Pulse 63   Ht 6\' 1"  (1.854 m)   Wt 212 lb (96.2 kg)   SpO2 95%   BMI 27.97 kg/m    Neurological Exam: MENTAL STATUS including orientation to time, place, person, recent and remote memory, attention span and concentration, language, and fund of knowledge is normal.  Speech is not dysarthric.  CRANIAL NERVES: II:  No visual field defects.    III-IV-VI: Pupils equal round and reactive to light.  Normal conjugate, extra-ocular eye movements in all directions of gaze.  No nystagmus.  No ptosis.   V:  Normal facial sensation.    VII:  Normal facial symmetry and movements.   VIII:  Normal hearing and vestibular function.   IX-X:  Normal palatal movement.   XI:  Normal shoulder shrug and head rotation.   XII:  Normal tongue strength and range of motion, no deviation or fasciculation.  MOTOR:  No atrophy, fasciculations or abnormal movements.  No pronator  drift.   Upper Extremity:  Right  Left  Deltoid  5/5   5/5   Biceps  5/5   5/5   Triceps  5/5   5/5   Infraspinatus 5/5  5/5  Medial pectoralis 5/5  5/5  Wrist extensors  5/5   5/5   Wrist flexors  5/5   5/5   Finger extensors  5/5   5/5   Finger flexors  5/5   5/5   Dorsal interossei  5/5   5/5   Abductor pollicis  5/5   5/5   Tone (Ashworth scale)  0  0   Lower Extremity:  Right  Left  Hip flexors  5/5   5/5   Hip extensors  5/5   5/5   Adductor 5/5  5/5  Abductor 5/5  5/5  Knee flexors  5/5   5/5   Knee extensors  5/5   5/5   Dorsiflexors  5/5   5/5   Plantarflexors  5/5   5/5   Toe extensors  5/5   5/5   Toe flexors  5/5   5/5   Tone (Ashworth scale)  0  0   MSRs:  Right        Left                  brachioradialis 2+  2+  biceps 2+  2+  triceps 2+  2+  patellar 3+  3+  ankle jerk 2+  2+  Hoffman no  no  plantar response down  down   SENSORY:  Normal and symmetric perception of light touch, pinprick, vibration, and proprioception.  Romberg's sign absent.   COORDINATION/GAIT: Normal finger-to- nose-finger.  Intact rapid alternating movements bilaterally.  Able to rise from a chair without using arms.  Gait narrow based and stable. Stressed gait intact.  Mild unsteadiness with tandem  gait, but able to perform.   IMPRESSION: 1. Bilateral feet and leg paresthesias.  Prior EDX was normal (2016) and hs exam shows intact distal sensation, strength, and reflexes which is atypical for neuropathy.  In fact, reflexes are brisker than what would be expected and therefore, I question the possibility of lumbosacral canal stenosis causing some of his symptoms.    - MRI lumbar spine wo contrast.   - NCS/EMG of the right leg  2. 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   - NCS/EMG of the right arm  - Strategies to avoid nerve impingement at the elbow discussed  Further recommendations pending results.   Thank you for allowing me to participate in  patient's care.  If I can answer any additional questions, I would be pleased to do so.    Sincerely,    [name] K. Posey Pronto, DO

## 2020-10-04 NOTE — Patient Instructions (Signed)
MRI lumbar spine without contrast  Nerve testing of the right arm and leg.   ELECTROMYOGRAM AND NERVE CONDUCTION STUDIES (EMG/NCS) INSTRUCTIONS  How to Prepare The neurologist conducting the EMG will need to know if you have certain medical conditions. Tell the neurologist and other EMG lab personnel if you: . Have a pacemaker or any other electrical medical device . Take blood-thinning medications . Have hemophilia, a blood-clotting disorder that causes prolonged bleeding Bathing Take a shower or bath shortly before your exam in order to remove oils from your skin. Don't apply lotions or creams before the exam.  What to Expect You'll likely be asked to change into a hospital gown for the procedure and lie down on an examination table. The following explanations can help you understand what will happen during the exam.  . Electrodes. The neurologist or a technician places surface electrodes at various locations on your skin depending on where you're experiencing symptoms. Or the neurologist may insert needle electrodes at different sites depending on your symptoms.  . Sensations. The electrodes will at times transmit a tiny electrical current that you may feel as a twinge or spasm. The needle electrode may cause discomfort or pain that usually ends shortly after the needle is removed. If you are concerned about discomfort or pain, you may want to talk to the neurologist about taking a short break during the exam.  . Instructions. During the needle EMG, the neurologist will assess whether there is any spontaneous electrical activity when the muscle is at rest - activity that isn't present in healthy muscle tissue - and the degree of activity when you slightly contract the muscle.  He or she will give you instructions on resting and contracting a muscle at appropriate times. Depending on what muscles and nerves the neurologist is examining, he or she may ask you to change positions during the exam.   After your EMG You may experience some temporary, minor bruising where the needle electrode was inserted into your muscle. This bruising should fade within several days. If it persists, contact your primary care doctor.

## 2020-10-05 DIAGNOSIS — I2510 Atherosclerotic heart disease of native coronary artery without angina pectoris: Secondary | ICD-10-CM | POA: Diagnosis not present

## 2020-10-05 DIAGNOSIS — I10 Essential (primary) hypertension: Secondary | ICD-10-CM | POA: Diagnosis not present

## 2020-10-05 DIAGNOSIS — F5101 Primary insomnia: Secondary | ICD-10-CM | POA: Diagnosis not present

## 2020-10-05 DIAGNOSIS — Z0001 Encounter for general adult medical examination with abnormal findings: Secondary | ICD-10-CM | POA: Diagnosis not present

## 2020-10-05 DIAGNOSIS — G2581 Restless legs syndrome: Secondary | ICD-10-CM | POA: Diagnosis not present

## 2020-10-05 DIAGNOSIS — R7301 Impaired fasting glucose: Secondary | ICD-10-CM | POA: Diagnosis not present

## 2020-10-05 DIAGNOSIS — G629 Polyneuropathy, unspecified: Secondary | ICD-10-CM | POA: Diagnosis not present

## 2020-10-05 DIAGNOSIS — Z0000 Encounter for general adult medical examination without abnormal findings: Secondary | ICD-10-CM | POA: Diagnosis not present

## 2020-10-05 DIAGNOSIS — E782 Mixed hyperlipidemia: Secondary | ICD-10-CM | POA: Diagnosis not present

## 2020-10-06 DIAGNOSIS — H33103 Unspecified retinoschisis, bilateral: Secondary | ICD-10-CM | POA: Diagnosis not present

## 2020-10-06 DIAGNOSIS — H04213 Epiphora due to excess lacrimation, bilateral lacrimal glands: Secondary | ICD-10-CM | POA: Diagnosis not present

## 2020-10-06 DIAGNOSIS — H40013 Open angle with borderline findings, low risk, bilateral: Secondary | ICD-10-CM | POA: Diagnosis not present

## 2020-10-06 DIAGNOSIS — H353132 Nonexudative age-related macular degeneration, bilateral, intermediate dry stage: Secondary | ICD-10-CM | POA: Diagnosis not present

## 2020-10-08 DIAGNOSIS — I2510 Atherosclerotic heart disease of native coronary artery without angina pectoris: Secondary | ICD-10-CM | POA: Diagnosis not present

## 2020-10-08 DIAGNOSIS — G4733 Obstructive sleep apnea (adult) (pediatric): Secondary | ICD-10-CM | POA: Diagnosis not present

## 2020-10-08 DIAGNOSIS — E782 Mixed hyperlipidemia: Secondary | ICD-10-CM | POA: Diagnosis not present

## 2020-10-08 DIAGNOSIS — E875 Hyperkalemia: Secondary | ICD-10-CM | POA: Diagnosis not present

## 2020-10-08 DIAGNOSIS — G4700 Insomnia, unspecified: Secondary | ICD-10-CM | POA: Diagnosis not present

## 2020-10-08 DIAGNOSIS — N182 Chronic kidney disease, stage 2 (mild): Secondary | ICD-10-CM | POA: Diagnosis not present

## 2020-10-08 DIAGNOSIS — E039 Hypothyroidism, unspecified: Secondary | ICD-10-CM | POA: Diagnosis not present

## 2020-10-08 DIAGNOSIS — I129 Hypertensive chronic kidney disease with stage 1 through stage 4 chronic kidney disease, or unspecified chronic kidney disease: Secondary | ICD-10-CM | POA: Diagnosis not present

## 2020-10-08 DIAGNOSIS — R7303 Prediabetes: Secondary | ICD-10-CM | POA: Diagnosis not present

## 2020-10-11 DIAGNOSIS — R7301 Impaired fasting glucose: Secondary | ICD-10-CM | POA: Diagnosis not present

## 2020-10-11 DIAGNOSIS — Z0000 Encounter for general adult medical examination without abnormal findings: Secondary | ICD-10-CM | POA: Diagnosis not present

## 2020-10-11 DIAGNOSIS — I10 Essential (primary) hypertension: Secondary | ICD-10-CM | POA: Diagnosis not present

## 2020-10-11 DIAGNOSIS — I2510 Atherosclerotic heart disease of native coronary artery without angina pectoris: Secondary | ICD-10-CM | POA: Diagnosis not present

## 2020-10-11 DIAGNOSIS — Z6828 Body mass index (BMI) 28.0-28.9, adult: Secondary | ICD-10-CM | POA: Diagnosis not present

## 2020-10-11 DIAGNOSIS — G629 Polyneuropathy, unspecified: Secondary | ICD-10-CM | POA: Diagnosis not present

## 2020-10-11 DIAGNOSIS — G4733 Obstructive sleep apnea (adult) (pediatric): Secondary | ICD-10-CM | POA: Diagnosis not present

## 2020-10-11 DIAGNOSIS — R319 Hematuria, unspecified: Secondary | ICD-10-CM | POA: Diagnosis not present

## 2020-10-11 DIAGNOSIS — E782 Mixed hyperlipidemia: Secondary | ICD-10-CM | POA: Diagnosis not present

## 2020-10-19 ENCOUNTER — Ambulatory Visit
Admission: RE | Admit: 2020-10-19 | Discharge: 2020-10-19 | Disposition: A | Discharge status: Home / Self Care | Payer: Medicare HMO | Source: Ambulatory Visit | Attending: Neurology | Admitting: Neurology

## 2020-10-19 DIAGNOSIS — M48061 Spinal stenosis, lumbar region without neurogenic claudication: Secondary | ICD-10-CM

## 2020-10-19 DIAGNOSIS — M5127 Other intervertebral disc displacement, lumbosacral region: Secondary | ICD-10-CM | POA: Diagnosis not present

## 2020-10-19 DIAGNOSIS — G5622 Lesion of ulnar nerve, left upper limb: Secondary | ICD-10-CM

## 2020-10-19 DIAGNOSIS — M4319 Spondylolisthesis, multiple sites in spine: Secondary | ICD-10-CM | POA: Diagnosis not present

## 2020-10-19 DIAGNOSIS — R292 Abnormal reflex: Secondary | ICD-10-CM

## 2020-10-19 DIAGNOSIS — R202 Paresthesia of skin: Secondary | ICD-10-CM

## 2020-10-19 DIAGNOSIS — M5126 Other intervertebral disc displacement, lumbar region: Secondary | ICD-10-CM | POA: Diagnosis not present

## 2020-10-19 IMAGING — MR MR LUMBAR SPINE W/O CM
4 of 5 series · 29 of 48 positions shown · non-contrast
Comparison: [DATE] report.

CLINICAL DATA: Lumbar radiculopathy, osteoporosis presence or risk

EXAM:
MRI LUMBAR SPINE WITHOUT CONTRAST
TECHNIQUE: Multiplanar, multisequence MR imaging of the lumbar spine was
performed. No intravenous contrast was administered.

[Series 4: T1 · sagittal · 4.0mm · 1.09mm/px · 8 of 21 slices shown (1 of 2)]
[im 1/21]
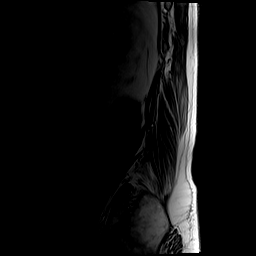
[im 3/21]
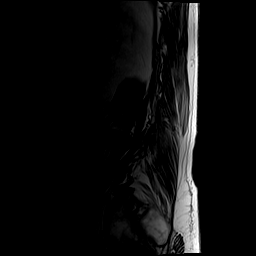
[im 6/21]
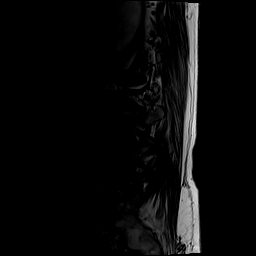
[im 9/21]
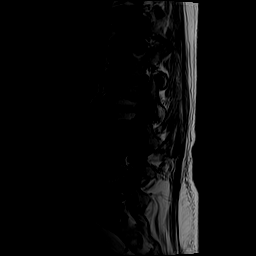
[im 12/21]
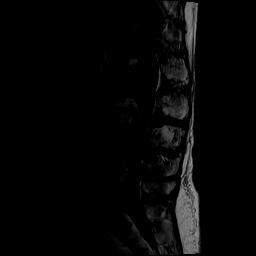
[im 15/21]
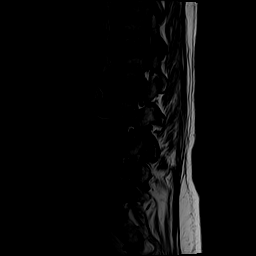
[im 18/21]
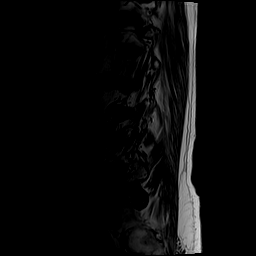
[im 21/21]
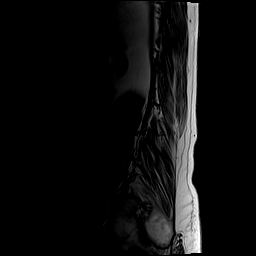

[Series 6: T2 · sagittal · 4.0mm · 1.09mm/px · 7 of 21 slices shown (1 of 2)]
[im 1/21]
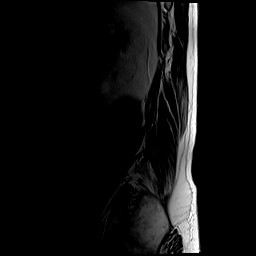
[im 4/21]
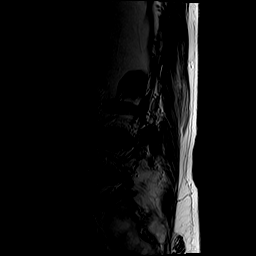
[im 7/21]
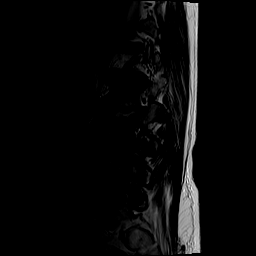
[im 11/21]
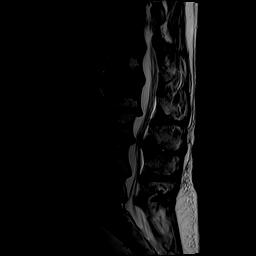
[im 14/21]
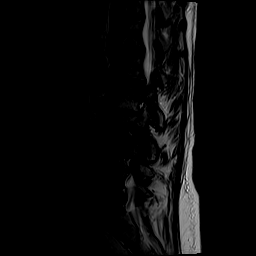
[im 17/21]
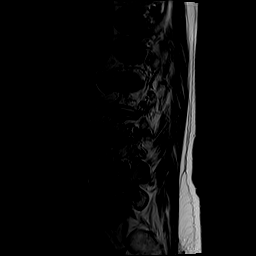
[im 21/21]
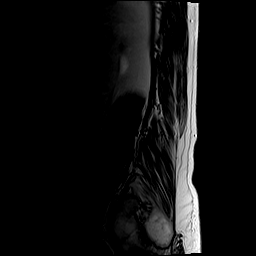

[Series 7: T2 · axial · 4.0mm · 0.39mm/px · z∈[-142,+63]mm · 9 of 40 slices shown (2 of 2)]
[im 1/40]
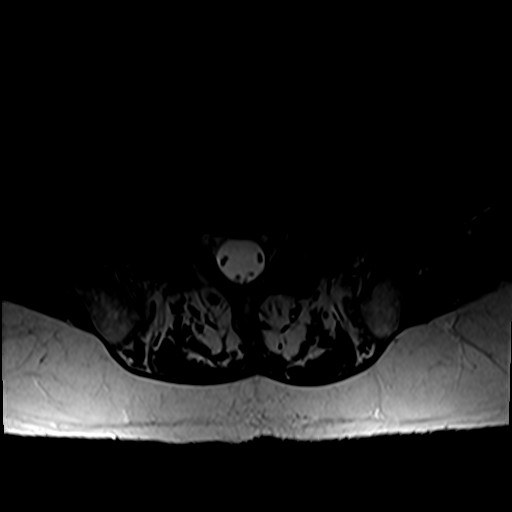
[im 7/40]
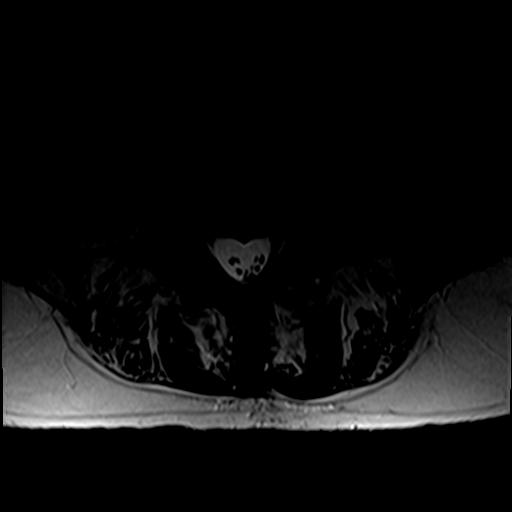
[im 14/40]
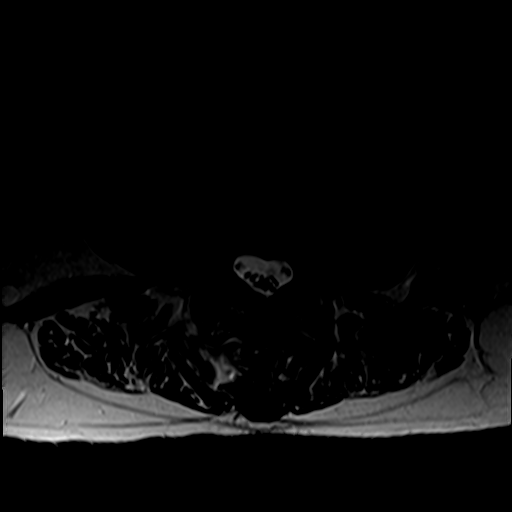
[im 17/40]
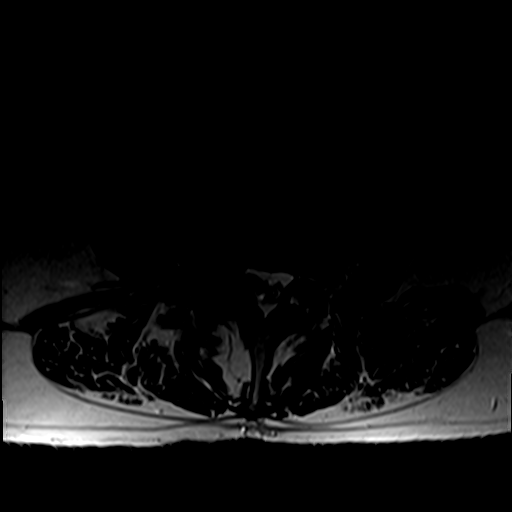
[im 20/40]
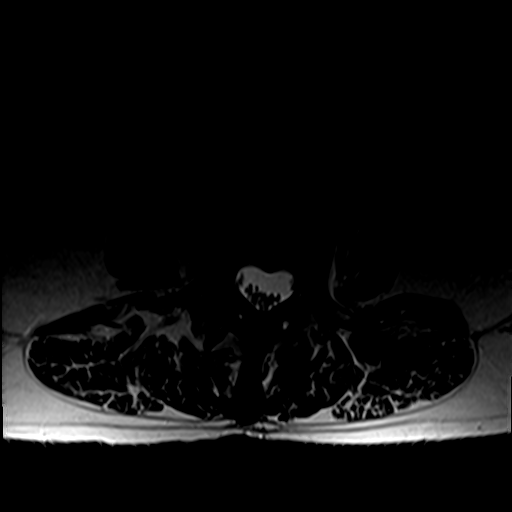
[im 23/40]
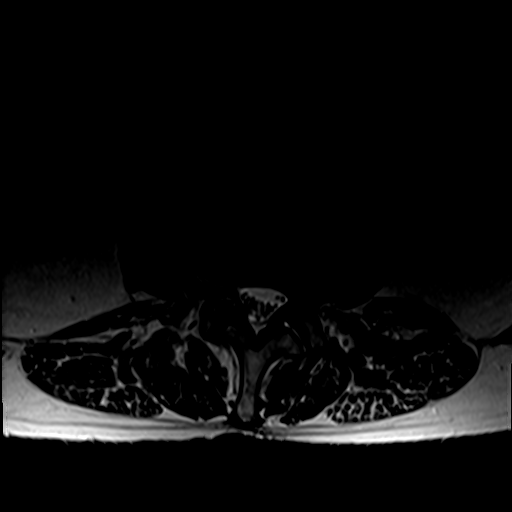
[im 27/40]
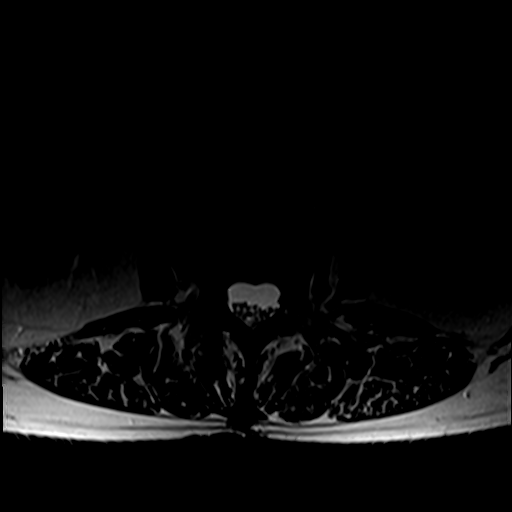
[im 33/40]
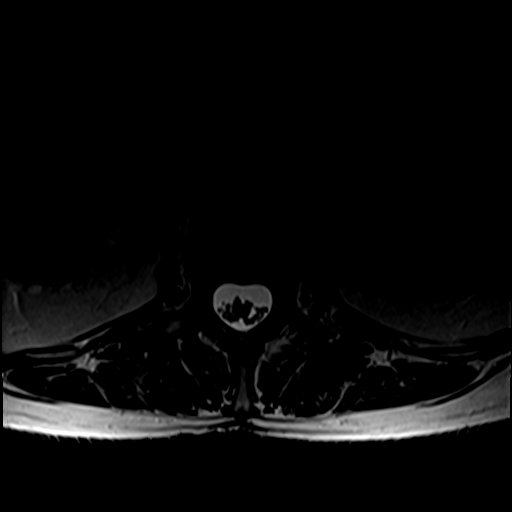
[im 40/40]
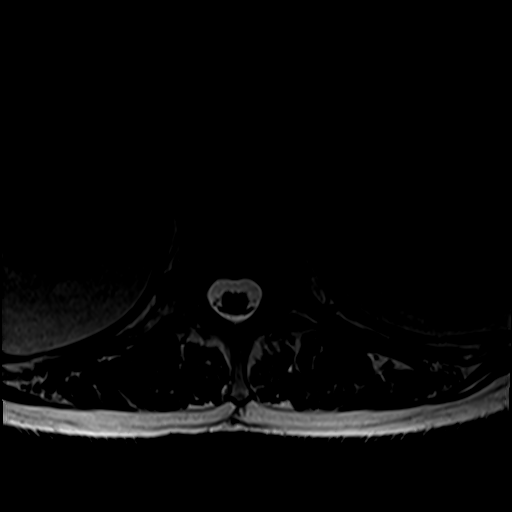

[Series 8: T1 · axial · 4.0mm · 0.39mm/px · z∈[-142,+29]mm · 5 of 40 slices shown (2 of 2)]
[im 1/40]
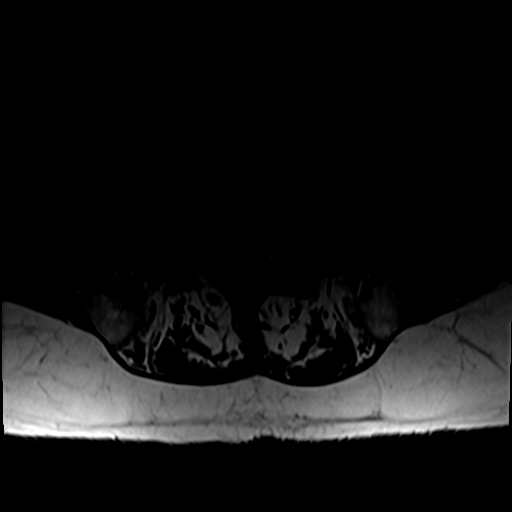
[im 7/40]
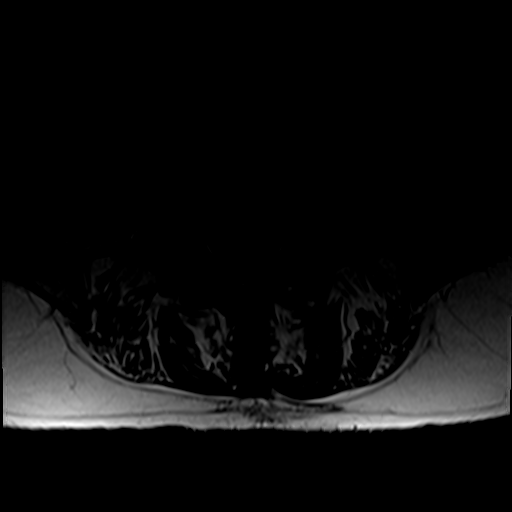
[im 14/40]
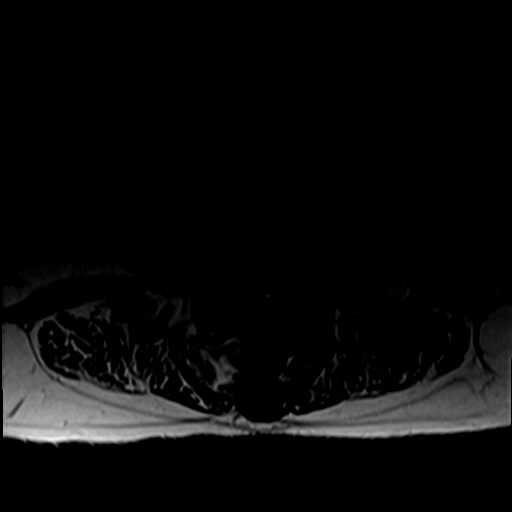
[im 20/40]
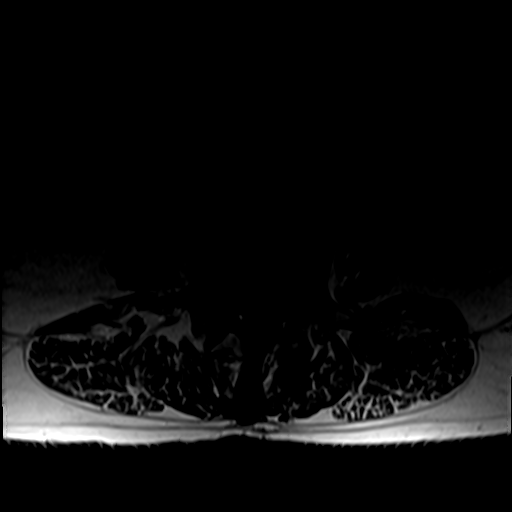
[im 33/40]
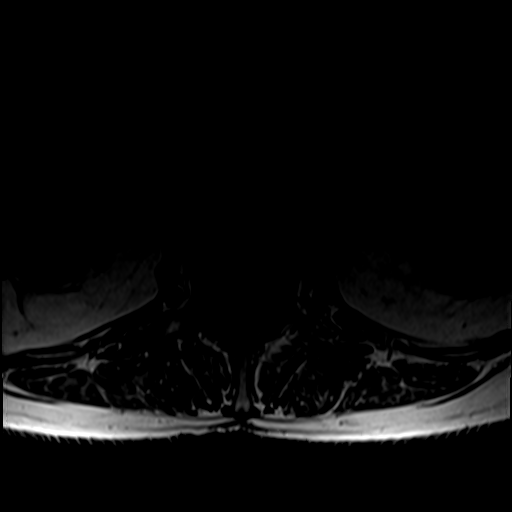

[29 of 48 positions shown; findings below may reference images not displayed]

FINDINGS: Segmentation:  Standard.

Alignment: Straightening of lordosis. Grade 1 retrolisthesis at the
L2-3, L3-4 and L5-S1 levels.

Vertebrae: Modic type [DATE] endplate degenerative changes most
prominent at L3-4. Scattered hemangiomata. No fracture or aggressive
osseous lesion.

Conus medullaris and cauda equina: Conus extends to the L2 level.
Conus and cauda equina appear normal.

Disc levels: Multilevel desiccation and disc space loss. Multilevel
Schmorl's node formation.

L1-2: Disc bulge with bilateral foraminal protrusions. Facet
degenerative spurring and prominent ligamentum flavum. Patent spinal
canal. Moderate bilateral neural foraminal narrowing.

L2-3: Disc bulge, ligamentum flavum thickening and bilateral facet
hypertrophy. Mild spinal canal, severe right and moderate left
neural foraminal narrowing.

L3-4: Disc bulge with superimposed left foraminal protrusion.
Ligamentum flavum thickening and bilateral facet hypertrophy.
Moderate spinal canal and severe right greater than left neural
foraminal narrowing.

L4-5: Disc bulge, ligamentum flavum thickening and bilateral facet
hypertrophy. Moderate spinal canal and severe bilateral neural
foraminal narrowing.

L5-S1: Disc bulge with shallow central and left foraminal
protrusions. Ligamentum flavum thickening and bilateral facet
hypertrophy. Mild spinal canal and severe bilateral neural foraminal
narrowing.

Paraspinal and other soft tissues: Atrophic right kidney.
IMPRESSION: Moderate spinal canal and severe bilateral neural foraminal
narrowing at the L3-4, L4-5 levels.

Moderate to severe bilateral L1-2, L2-3 and L5-S1 neural foraminal
narrowing.

## 2020-10-21 ENCOUNTER — Telehealth: Payer: Self-pay

## 2020-10-21 DIAGNOSIS — M5416 Radiculopathy, lumbar region: Secondary | ICD-10-CM

## 2020-10-21 DIAGNOSIS — M48061 Spinal stenosis, lumbar region without neurogenic claudication: Secondary | ICD-10-CM

## 2020-10-21 NOTE — Telephone Encounter (Signed)
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  These findings would explain his numbness/tingling in the legs.  I would recommend that he start PT for low back strengthening and stretching, if no improvement with PT, then we will need to send him to see a surgeon to explore management options.  Thanks.

## 2020-10-21 NOTE — Telephone Encounter (Signed)
Called patient and left a message for a call back.  

## 2020-10-25 NOTE — Telephone Encounter (Signed)
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  These findings would explain his numbness/tingling in the legs.  I would recommend that he start PT for low back strengthening and stretching, if no improvement with PT, then we will need to send him to see a surgeon to explore management options.  Thanks.

## 2020-10-25 NOTE — Telephone Encounter (Signed)
Called patient and left message for a call back.  

## 2020-10-25 NOTE — Telephone Encounter (Signed)
Called patient and informed him of results and recommendations. Patient is agreeable to have a PT referral sent. Patient wanted to know if he had to follow up with Dr. Posey Pronto. I informed patient that based on his last visit with Dr. Posey Pronto he follows up as needed. Patient has an EMG appt on 11/16/20 and once results are in his follow up instructions might differ and that we would inform him if so. Patient verbalized understanding and had no questions or concerns.   PT order created and faxed to [facility]. Patient is aware someone will be in contacting him from [facility].

## 2020-10-27 ENCOUNTER — Ambulatory Visit (HOSPITAL_COMMUNITY)
Admission: RE | Admit: 2020-10-27 | Discharge: 2020-10-27 | Disposition: A | Discharge status: Home / Self Care | Payer: Medicare HMO | Source: Ambulatory Visit | Attending: Vascular Surgery | Admitting: Vascular Surgery

## 2020-10-27 ENCOUNTER — Ambulatory Visit (INDEPENDENT_AMBULATORY_CARE_PROVIDER_SITE_OTHER): Payer: Medicare HMO | Admitting: Physician Assistant

## 2020-10-27 ENCOUNTER — Other Ambulatory Visit: Payer: Self-pay

## 2020-10-27 VITALS — BP 157/92 | HR 75 | Temp 97.9°F | Resp 20 | Ht 72.0 in | Wt 210.5 lb

## 2020-10-27 DIAGNOSIS — I6523 Occlusion and stenosis of bilateral carotid arteries: Secondary | ICD-10-CM

## 2020-10-27 NOTE — Progress Notes (Signed)
Established Carotid Patient   History of Present Illness   [name] [name] is a 80 y.o. (1941-07-10) male who presents with chief complaint: carotid artery stenosis.  Previous carotid studies demonstrated: RICA 1-61% stenosis, LICA 0-96% stenosis.  He is s/p combined R carotid endarterectomy and CABG in 2013 for high grade asymptomatic stenosis.  He denies stroke like symptoms including slurring speech, changes in vision, or one sided weakness.  He follows regularly with his PCP every 6 months for management of chronic medical conditions.  He denies tobacco use.   The patient's PMH, PSH, SH, and FamHx were reviewed and are unchanged from prior visit.  Current Outpatient Medications  Medication Sig Dispense Refill  . aspirin EC 81 MG tablet Take 81 mg by mouth daily.    Marland Kitchen atorvastatin (LIPITOR) 40 MG tablet TAKE 1 TABLET EVERY DAY (NEED APPOINTMENT FOR FURTHER REFILLS) 30 tablet 0  . diclofenac (VOLTAREN) 75 MG EC tablet TAKE (1) TABLET BY MOUTH TWICE DAILY. 60 tablet 0  . Esomeprazole Magnesium (NEXIUM PO) Take 20 mg by mouth daily.     . fluorouracil (EFUDEX) 5 % cream Apply topically at bedtime. 40 g 1  . metoprolol (LOPRESSOR) 50 MG tablet Take 1 tablet (50 mg total) by mouth 2 (two) times daily. 180 tablet 1  . Multiple Vitamins-Minerals (PRESERVISION AREDS 2 PO) Take 1 tablet by mouth 2 (two) times daily.     . Omega-3 Fatty Acids (FISH OIL) 1000 MG CPDR Take 1,000 mg by mouth 2 (two) times daily.     . promethazine (PHENERGAN) 12.5 MG tablet Take 1 tablet (12.5 mg total) by mouth every 6 (six) hours as needed for nausea or vomiting. 30 tablet 0  . Psyllium (METAMUCIL PO) Take 3 capsules by mouth daily.     . ramipril (ALTACE) 10 MG capsule Take 10 mg by mouth daily.    Marland Kitchen SYNTHROID 112 MCG tablet Take 112 mcg by mouth daily before breakfast.     . zolpidem (AMBIEN) 10 MG tablet Take 10 mg by mouth at bedtime as needed for sleep.     No current facility-administered medications  for this visit.    REVIEW OF SYSTEMS (negative unless checked):   Cardiac:  []  Chest pain or chest pressure? []  Shortness of breath upon activity? []  Shortness of breath when lying flat? []  Irregular heart rhythm?  Vascular:  []  Pain in calf, thigh, or hip brought on by walking? []  Pain in feet at night that wakes you up from your sleep? []  Blood clot in your veins? []  Leg swelling?  Pulmonary:  []  Oxygen at home? []  Productive cough? []  Wheezing?  Neurologic:  []  Sudden weakness in arms or legs? []  Sudden numbness in arms or legs? []  Sudden onset of difficult speaking or slurred speech? []  Temporary loss of vision in one eye? []  Problems with dizziness?  Gastrointestinal:  []  Blood in stool? []  Vomited blood?  Genitourinary:  []  Burning when urinating? []  Blood in urine?  Psychiatric:  []  Major depression  Hematologic:  []  Bleeding problems? []  Problems with blood clotting?  Dermatologic:  []  Rashes or ulcers?  Constitutional:  []  Fever or chills?  Ear/Nose/Throat:  []  Change in hearing? []  Nose bleeds? []  Sore throat?  Musculoskeletal:  []  Back pain? [x]  Joint pain? []  Muscle pain?   Physical Examination   Vitals:   10/27/20 0917 10/27/20 0919  BP: (!) 174/89 (!) 157/92  Pulse: 75   Resp: 20   Temp: 97.9  F (36.6 C)   TempSrc: Temporal   SpO2: 96%   Weight: 210 lb 8 oz (95.5 kg)   Height: 6' (1.829 m)    Body mass index is 28.55 kg/m.  General:  WDWN in NAD; vital signs documented above Gait: Not observed HENT: WNL, normocephalic Pulmonary: normal non-labored breathing Cardiac: regular HR Abdomen: soft, NT, no masses Skin: without rashes Extremities: without ischemic changes, without Gangrene , without cellulitis; without open wounds; symmetrical radial pulses Musculoskeletal: no muscle wasting or atrophy  Neurologic: A&O X 3;  CN grossly intact Psychiatric:  The pt has Normal affect.  Non-Invasive Vascular Imaging   B  Carotid Duplex :   R ICA stenosis:  1-39%  R VA:  patent and antegrade  L ICA stenosis:  1-39%  L VA:  patent and antegrade   Medical Decision Making   [name] [name] is a 81 y.o. male who presents for surveillance of carotid artery stenosis   Carotid duplex unchanged over the past year demonstrating 1-39% stenosis of bilateral carotid artery stenosis  Continue regular follow up with PCP for management of chronic medical conditions  Continue aspirin and statin daily  Recheck carotid duplex in 1 year   Dagoberto Ligas PA-C [facility] Office: [phone]  Clinic MD: Scot Dock

## 2020-11-02 ENCOUNTER — Telehealth: Payer: Self-pay | Admitting: Neurology

## 2020-11-02 NOTE — Telephone Encounter (Signed)
Okay to refer out elsewhere?

## 2020-11-02 NOTE — Telephone Encounter (Signed)
Patient called and said he'd like referred to a PT in [place], he did not know of any names of facilities. He is declining the referral to [facility] since they are not in [place].

## 2020-11-02 NOTE — Telephone Encounter (Signed)
OK to refer to [facility] out-pt PT.

## 2020-11-03 ENCOUNTER — Other Ambulatory Visit: Payer: Self-pay

## 2020-11-03 DIAGNOSIS — M48061 Spinal stenosis, lumbar region without neurogenic claudication: Secondary | ICD-10-CM

## 2020-11-03 DIAGNOSIS — M5416 Radiculopathy, lumbar region: Secondary | ICD-10-CM

## 2020-11-03 NOTE — Telephone Encounter (Signed)
Order placed

## 2020-11-03 NOTE — Telephone Encounter (Signed)
Left message, order placed at [place] out patient.

## 2020-11-08 ENCOUNTER — Ambulatory Visit (HOSPITAL_COMMUNITY): Payer: Medicare HMO | Attending: Neurology | Admitting: Physical Therapy

## 2020-11-08 ENCOUNTER — Encounter (HOSPITAL_COMMUNITY): Payer: Self-pay | Admitting: Physical Therapy

## 2020-11-08 ENCOUNTER — Other Ambulatory Visit: Payer: Self-pay

## 2020-11-08 DIAGNOSIS — M6281 Muscle weakness (generalized): Secondary | ICD-10-CM | POA: Diagnosis not present

## 2020-11-08 DIAGNOSIS — M5416 Radiculopathy, lumbar region: Secondary | ICD-10-CM | POA: Insufficient documentation

## 2020-11-08 DIAGNOSIS — R262 Difficulty in walking, not elsewhere classified: Secondary | ICD-10-CM | POA: Diagnosis not present

## 2020-11-08 NOTE — Therapy (Signed)
[facility] [address] [place], [place], [address] Phone: [phone]   Fax:  [phone]  Physical Therapy Evaluation  Patient Details  Name: [name] [name] MRN: [id number] Date of Birth: Feb 05, 1941 Referring Provider (PT): Arvin Collard , patel   Encounter Date: 11/08/2020   PT End of Session - 11/08/20 0915    Visit Number 1    Number of Visits 12    Date for PT Re-Evaluation 12/20/20    Authorization Type [employer]    PT Start Time 0830    PT Stop Time 0910    PT Time Calculation (min) 40 min    Activity Tolerance Patient tolerated treatment well    Behavior During Therapy [facility] for tasks assessed/performed           Past Medical History:  Diagnosis Date  . Arthritis   . CAD (coronary artery disease)   . CAD (coronary artery disease) 12/04/2011   Left Main Disease with 3- vessel CAD   . CAD (coronary artery disease)    seeing Dr Harl Bowie  . CHF (congestive heart failure) ([place])   . Deviated septum   . Dyslipidemia   . GERD (gastroesophageal reflux disease)   . Heart attack ([place])   . Hyperlipidemia   . Hypertension   . Hypothyroidism   . MI (myocardial infarction) ([place]) 1997  . Neuropathy   . Obesity (BMI 30-39.9) 09/02/2018  . PONV (postoperative nausea and vomiting)    has in past, no problems after most recent surgery  . 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 S/P carotid endarterectomy 02/29/2012   Right CEA for asymptomatic severe right ICA stenosis   . 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 Shortness of breath    with exertion  . Sleep apnea    CPAP, sleep study at [facility]  . 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   80-99% stenosis RICA, asymptomatic  . Thyroid nodule   . Wears glasses    or contacts    Past Surgical History:  Procedure Laterality Date  . ADENOIDECTOMY    . CARDIAC CATHETERIZATION     02/22/12  . CARDIOVASCULAR  STRESS TEST  7/13  . CAROTID ENDARTERECTOMY    . COLONOSCOPY  15-20years and 12/04/11   Done at [place]  12/11/2011   Procedure: COLONOSCOPY;  Surgeon: Rogene Houston, MD;  [facility];  Service: Endoscopy;  Laterality: N/A;  830  . CORONARY ARTERY BYPASS GRAFT  02/29/2012   Procedure: CORONARY ARTERY BYPASS GRAFTING (CABG);  Surgeon: Rexene Alberts, MD;  Location: [place];  Service: Open Heart Surgery;  Laterality: N/A;  times three using Left Internal Mammary Artery and Right Greater Saphenous Vein Graft Harvested Endoscopically  . CORONARY STENT PLACEMENT    . ENDARTERECTOMY  02/29/2012   Procedure: ENDARTERECTOMY CAROTID;  Surgeon: Angelia Mould, MD;  Location: [place];  Service: Vascular;  Laterality: Right;  . FOOT SURGERY  06/30/11   artificial joint in big toe right foot   . KNEE ARTHROSCOPY WITH MEDIAL MENISECTOMY Left 07/15/2019   Procedure: KNEE ARTHROSCOPY WITH MEDIAL MENISCECTOMY AND LATERAL MENISCECTOMY;  Surgeon: Carole Civil, MD;  Location: [facility];  Service: Orthopedics;  Laterality: Left;  . LEFT HEART CATHETERIZATION WITH CORONARY ANGIOGRAM N/A 02/20/2012   Procedure: LEFT HEART CATHETERIZATION WITH CORONARY ANGIOGRAM;  Surgeon: Marcello Moores  Floyce Stakes, MD;  Location[facility] C[facility] LAB;  Service: Cardiovascular;  Laterality: N/A;  . NASAL SEPTUM SURGERY    . Totol thyroidectomy  02/06/2011  . TRANSESOPHAGEAL ECHOCARDIOGRAM    . UPPER GASTROINTESTINAL ENDOSCOPY  35-40 years ago   [facility]    There were no vitals filed for this visit.    Subjective Assessment - 11/08/20 0824    Subjective PT states that he has been experiencing B leg  pain for years.  He states that he was going to a neurologist for two years who gave him various medications none of which improved his sx.  He switched neurologists and is now being referred to skilled PT>    Pertinent History HTN, MI CAD, CAby CHF, surgery on Rt foot replaced jt of big toe    Limitations  Standing;House hold activities;Walking;Lifting    How long can you sit comfortably? no problem    How long can you stand comfortably? 30 minutes    How long can you walk comfortably? 20 minutes partially due to toe bothering him so much    Diagnostic tests MRI severe foraminal narrowing L3-L5; moderate L1 to L 3    Currently in Pain? Yes    Pain Score 4    worst pain in the past week 9/10   Pain Location Leg    Pain Orientation Right;Left    Pain Descriptors / Indicators Tightness;Pins and needles    Pain Radiating Towards B LE equal pain goes to the toes.    Pain Onset More than a month ago    Pain Frequency Constant    Aggravating Factors  walking    Pain Relieving Factors sitting    Effect of Pain on Daily Activities limits              [facility] PT Assessment - 11/08/20 0001      Assessment   Medical Diagnosis Radicular lumbar pain    Referring Provider (PT) Arvin Collard , patel    Onset Date/Surgical Date --   chronic   Next MD Visit 11/15/2020    Prior Therapy none      Precautions   Precautions None      Restrictions   Weight Bearing Restrictions No      Balance Screen   Has the patient fallen in the past 6 months No      [place] residence      Prior Function   Level of Independence Independent    Vocation Retired    Leisure wants to be able to walk more      Cognition   Overall Cognitive Status Within Functional Limits for tasks assessed      Observation/Other Assessments   Focus on Therapeutic Outcomes (FOTO)  53, 47% affected      Functional Tests   Functional tests Single leg stance;Sit to Stand      Single Leg Stance   Comments LT: 5";   RT: 6"      Sit to Stand   Comments 9 in 30 seconds      Posture/Postural Control   Posture/Postural Control Postural limitations    Postural Limitations Decreased lumbar lordosis;Decreased thoracic kyphosis;Posterior pelvic tilt      ROM / Strength   AROM / PROM / Strength  AROM;Strength      AROM   AROM Assessment Site Lumbar    Lumbar Flexion fingers 5" from floor:  reps no change    Lumbar Extension  15 no change with reps      Strength   Strength Assessment Site Hip;Knee;Ankle    Right/Left Hip Right;Left    Right Hip Flexion 5/5    Right Hip Extension 3/5    Left Hip Flexion 5/5    Left Hip Extension 3+/5    Left Hip ABduction 5/5    Right/Left Knee Right;Left    Right Knee Flexion 5/5    Right Knee Extension 5/5    Left Knee Flexion 5/5    Left Knee Extension 5/5    Right/Left Ankle Right;Left    Right Ankle Plantar Flexion 4+/5    Left Ankle Dorsiflexion 4/5      Flexibility   Soft Tissue Assessment /Muscle Length yes    Hamstrings Rt 143; Lt 150                      Objective measurements completed on examination: See above findings.       [facility] Adult PT Treatment/Exercise - 11/08/20 0001      Exercises   Exercises Lumbar      Lumbar Exercises: Stretches   Active Hamstring Stretch Left;Right;2 reps;30 seconds    Single Knee to Chest Stretch Right;Left;3 reps;30 seconds    Double Knee to Chest Stretch 2 reps;30 seconds                  PT Education - 11/08/20 0914    Education Details hep    Person(s) Educated Patient    Methods Explanation;Handout;Demonstration    Comprehension Verbalized understanding;Returned demonstration            PT Short Term Goals - 11/08/20 0923      PT SHORT TERM GOAL #1   Title PT to be I in HEP to allow decreased nerve irritation as shown by radicular sx going to knee level only    Time 3    Period Weeks    Status New    Target Date 11/29/20      PT SHORT TERM GOAL #2   Title PT to be abe able to walk for 40 minutes without having to rest.    Time 3    Period Weeks    Status New      PT SHORT TERM GOAL #3   Title PT hip extensor strength to be increased by 1/2 grade to allow getting up from low couches/cars to be effortless.    Time 3    Period Weeks     Status New             PT Long Term Goals - 11/08/20 3419      PT LONG TERM GOAL #1   Title PT to be I in an advance HEP to allow pt radicular sx to be to mid thigh only    Time 6    Period Weeks    Status New    Target Date 12/20/20      PT LONG TERM GOAL #2   Title PT to be hip extension strength to be at least a 4+/5 to allow pt to feel confident going up and down 12 steps without the use of UE    Time 6    Period Weeks    Status New      PT LONG TERM GOAL #3   Title PT to be able to single leg stance on both legs for at least 30" to reduce risk of falls  Time 6    Period Weeks    Status New                  Plan - 11/08/20 0916    Clinical Impression Statement Mr. Mascio is a 80 yo male who has been having B LE radiculopathy for years, it has been progressive and he denies back pain.  The pt was placed on numerous medications none of which assisted in decreasing his leg pain.  He is currently being referred to skilled PT .  Evaluation demonstrates a flat back, decreased hip extensor strength, decreased functional ability, decreased ROM, imparied flexibility and increased pain.  Mr. Geisen will benefit from skilled PT to address these issues and maximize his functional abiliyt    Personal Factors and Comorbidities Age;Comorbidity 2;Time since onset of injury/illness/exacerbation    Comorbidities CAD, CABG,CHF, HTN    Examination-Activity Limitations Carry;Lift;Locomotion Level;Stand;Stairs    Examination-Participation Restrictions Cleaning;Yard Work;Shop    Stability/Clinical Decision Making Evolving/Moderate complexity    Clinical Decision Making Moderate    Rehab Potential Good    PT Frequency 2x / week    PT Duration 6 weeks    PT Treatment/Interventions Patient/family education;Manual techniques;Therapeutic activities;Functional mobility training;Stair training;Gait training;Therapeutic exercise;Balance training;Neuromuscular re-education    PT Next Visit  Plan hip excursion, thoracic excursion, all 4 piriformis stretch, quad stretch manual to assist in imporving back mobility, bridging, progress decompressive , gluteal and balance activities    PT Home Exercise Plan single knee to chest. double, knee to chest and active hamstring stretch           Patient will benefit from skilled therapeutic intervention in order to improve the following deficits and impairments:  Decreased activity tolerance,Decreased balance,Decreased range of motion,Difficulty walking,Decreased strength,Increased fascial restricitons,Impaired flexibility,Pain  Visit Diagnosis: Radiculopathy, lumbar region  Difficulty in walking, not elsewhere classified  Muscle weakness (generalized)     Problem List Patient Active Problem List   Diagnosis Date Noted  . S/P left knee arthroscopy 07/15/19 07/31/2019  . Lateral meniscus derangement, left   . Derangement of posterior horn of medial meniscus of left knee   . OSA (obstructive sleep apnea) 09/02/2018  . Obesity (BMI 30-39.9) 09/02/2018  . Aftercare following surgery of the circulatory system, [place] 04/15/2014  . Occlusion and stenosis of carotid artery without mention of cerebral infarction 03/20/2012  . 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 S/P carotid endarterectomy 02/29/2012  . 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  . MI (myocardial infarction) ([place])   . Heart attack ([place])   . HTN (hypertension) 12/04/2011  . Hyperlipemia 12/04/2011  . GERD (gastroesophageal reflux disease) 12/04/2011  . CAD (coronary artery disease) 12/04/2011  . Hypothyroidism, postsurgical 07/13/2011  . Thyroid nodule, uninodular 02/22/2011    Rayetta Humphrey, PT CLT [phone] 11/08/2020, 9:30 AM  [facility] [address] [place], [place], [address] Phone: [phone]   Fax:  [phone]  Name: AYDRIAN HALPIN MRN: [id number] Date of Birth: September 15, 1940

## 2020-11-10 ENCOUNTER — Other Ambulatory Visit: Payer: Self-pay

## 2020-11-10 ENCOUNTER — Ambulatory Visit (HOSPITAL_COMMUNITY): Payer: Medicare HMO

## 2020-11-10 ENCOUNTER — Encounter (HOSPITAL_COMMUNITY): Payer: Self-pay

## 2020-11-10 DIAGNOSIS — R7301 Impaired fasting glucose: Secondary | ICD-10-CM | POA: Diagnosis not present

## 2020-11-10 DIAGNOSIS — G629 Polyneuropathy, unspecified: Secondary | ICD-10-CM | POA: Diagnosis not present

## 2020-11-10 DIAGNOSIS — M6281 Muscle weakness (generalized): Secondary | ICD-10-CM

## 2020-11-10 DIAGNOSIS — R262 Difficulty in walking, not elsewhere classified: Secondary | ICD-10-CM | POA: Diagnosis not present

## 2020-11-10 DIAGNOSIS — R319 Hematuria, unspecified: Secondary | ICD-10-CM | POA: Diagnosis not present

## 2020-11-10 DIAGNOSIS — M5416 Radiculopathy, lumbar region: Secondary | ICD-10-CM | POA: Diagnosis not present

## 2020-11-10 DIAGNOSIS — I10 Essential (primary) hypertension: Secondary | ICD-10-CM | POA: Diagnosis not present

## 2020-11-10 DIAGNOSIS — G4733 Obstructive sleep apnea (adult) (pediatric): Secondary | ICD-10-CM | POA: Diagnosis not present

## 2020-11-10 DIAGNOSIS — Z0000 Encounter for general adult medical examination without abnormal findings: Secondary | ICD-10-CM | POA: Diagnosis not present

## 2020-11-10 DIAGNOSIS — E782 Mixed hyperlipidemia: Secondary | ICD-10-CM | POA: Diagnosis not present

## 2020-11-10 DIAGNOSIS — I2510 Atherosclerotic heart disease of native coronary artery without angina pectoris: Secondary | ICD-10-CM | POA: Diagnosis not present

## 2020-11-10 DIAGNOSIS — Z6828 Body mass index (BMI) 28.0-28.9, adult: Secondary | ICD-10-CM | POA: Diagnosis not present

## 2020-11-10 NOTE — Therapy (Signed)
[facility] [place], [place], [address] Phone: [phone]   Fax:  [phone]  Physical Therapy Treatment  Patient Details  Name: [name] [name] MRN: [id number] Date of Birth: 1940-12-22 Referring Provider (PT): Arvin Collard , patel   Encounter Date: 11/10/2020   PT End of Session - 11/10/20 1329    Visit Number 2    Number of Visits 12    Date for PT Re-Evaluation 12/20/20    Authorization Type [employer]    PT Start Time 2992    PT Stop Time 1355    PT Time Calculation (min) 38 min    Activity Tolerance Patient tolerated treatment well    Behavior During Therapy [facility] for tasks assessed/performed           Past Medical History:  Diagnosis Date  . Arthritis   . CAD (coronary artery disease)   . CAD (coronary artery disease) 12/04/2011   Left Main Disease with 3- vessel CAD   . CAD (coronary artery disease)    seeing Dr Harl Bowie  . CHF (congestive heart failure) ([place])   . Deviated septum   . Dyslipidemia   . GERD (gastroesophageal reflux disease)   . Heart attack ([place])   . Hyperlipidemia   . Hypertension   . Hypothyroidism   . MI (myocardial infarction) ([place]) 1997  . Neuropathy   . Obesity (BMI 30-39.9) 09/02/2018  . PONV (postoperative nausea and vomiting)    has in past, no problems after most recent surgery  . 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 S/P carotid endarterectomy 02/29/2012   Right CEA for asymptomatic severe right ICA stenosis   . 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 Shortness of breath    with exertion  . Sleep apnea    CPAP, sleep study at [facility]  . 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   80-99% stenosis RICA, asymptomatic  . Thyroid nodule   . Wears glasses    or contacts    Past Surgical History:  Procedure Laterality Date  . ADENOIDECTOMY    . CARDIAC CATHETERIZATION     02/22/12  . CARDIOVASCULAR  STRESS TEST  7/13  . CAROTID ENDARTERECTOMY    . COLONOSCOPY  15-20years and 12/04/11   Done at [place]  12/11/2011   Procedure: COLONOSCOPY;  Surgeon: Rogene Houston, MD;  [facility];  Service: Endoscopy;  Laterality: N/A;  830  . CORONARY ARTERY BYPASS GRAFT  02/29/2012   Procedure: CORONARY ARTERY BYPASS GRAFTING (CABG);  Surgeon: Rexene Alberts, MD;  Location: [place];  Service: Open Heart Surgery;  Laterality: N/A;  times three using Left Internal Mammary Artery and Right Greater Saphenous Vein Graft Harvested Endoscopically  . CORONARY STENT PLACEMENT    . ENDARTERECTOMY  02/29/2012   Procedure: ENDARTERECTOMY CAROTID;  Surgeon: Angelia Mould, MD;  Location: [place];  Service: Vascular;  Laterality: Right;  . FOOT SURGERY  06/30/11   artificial joint in big toe right foot   . KNEE ARTHROSCOPY WITH MEDIAL MENISECTOMY Left 07/15/2019   Procedure: KNEE ARTHROSCOPY WITH MEDIAL MENISCECTOMY AND LATERAL MENISCECTOMY;  Surgeon: Carole Civil, MD;  [facility];  Service: Orthopedics;  Laterality: Left;  . LEFT HEART CATHETERIZATION WITH CORONARY ANGIOGRAM N/A 02/20/2012   Procedure: LEFT HEART CATHETERIZATION WITH CORONARY ANGIOGRAM;  Surgeon: Marcello Moores  Floyce Stakes, MD;  Location: [facility] CATH LAB;  Service: Cardiovascular;  Laterality: N/A;  . NASAL SEPTUM SURGERY    . Totol thyroidectomy  02/06/2011  . TRANSESOPHAGEAL ECHOCARDIOGRAM    . UPPER GASTROINTESTINAL ENDOSCOPY  35-40 years ago   [facility]    There were no vitals filed for this visit.   Subjective Assessment - 11/10/20 1314    Subjective Pt stated he continues to have constant pain BLE front and back, pain scale 6/10.  Reports he has began the HEP wihtout questions.    Pertinent History HTN, MI CAD, CAby CHF, surgery on Rt foot replaced jt of big toe    Currently in Pain? Yes    Pain Score 6     Pain Location Leg    Pain Orientation Right;Left    Pain Descriptors / Indicators  Tightness;Tingling;Pins and needles    Pain Type Chronic pain    Pain Radiating Towards BLE equal pain goes to the toes    Pain Onset More than a month ago    Pain Frequency Constant    Aggravating Factors  walking    Pain Relieving Factors sitting    Effect of Pain on Daily Activities limits                             [facility] Adult PT Treatment/Exercise - 11/10/20 0001      Posture/Postural Control   Posture/Postural Control Postural limitations    Postural Limitations Decreased lumbar lordosis;Decreased thoracic kyphosis;Posterior pelvic tilt      Exercises   Exercises Lumbar      Lumbar Exercises: Stretches   Lower Trunk Rotation 3 reps;10 seconds    Hip Flexor Stretch 2 reps;30 seconds    Hip Flexor Stretch Limitations Thomas stretch    Piriformis Stretch 3 reps;30 seconds    Piriformis Stretch Limitations supine wiht legs crossed      Lumbar Exercises: Standing   Functional Squats 10 reps    Functional Squats Limitations 3D hip excursion (cueing for mechanics with squats infront of chair)      Lumbar Exercises: Supine   Bridge 10 reps;3 seconds    Bridge Limitations 2 sets    Other Supine Lumbar Exercises Decompression exercise 2-5 5reps x 3" holds    Other Supine Lumbar Exercises Cueing for breathing through session with exertion                  PT Education - 11/10/20 1326    Education Details Reviewed goals, educated importance of HEP compliance for maximal benefits, pt able to recall did review appropriate hold times wiht stretches.  Educated importance of breathing through session    Person(s) Educated Patient    Methods Explanation;Demonstration;Verbal cues    Comprehension Verbalized understanding;Returned demonstration            PT Short Term Goals - 11/08/20 0923      PT SHORT TERM GOAL #1   Title PT to be I in HEP to allow decreased nerve irritation as shown by radicular sx going to knee level only    Time 3    Period  Weeks    Status New    Target Date 11/29/20      PT SHORT TERM GOAL #2   Title PT to be abe able to walk for 40 minutes without having to rest.    Time 3    Period Weeks    Status New  PT SHORT TERM GOAL #3   Title PT hip extensor strength to be increased by 1/2 grade to allow getting up from low couches/cars to be effortless.    Time 3    Period Weeks    Status New             PT Long Term Goals - 11/08/20 2409      PT LONG TERM GOAL #1   Title PT to be I in an advance HEP to allow pt radicular sx to be to mid thigh only    Time 6    Period Weeks    Status New    Target Date 12/20/20      PT LONG TERM GOAL #2   Title PT to be hip extension strength to be at least a 4+/5 to allow pt to feel confident going up and down 12 steps without the use of UE    Time 6    Period Weeks    Status New      PT LONG TERM GOAL #3   Title PT to be able to single leg stance on both legs for at least 30" to reduce risk of falls    Time 6    Period Weeks    Status New                 Plan - 11/10/20 1344    Clinical Impression Statement Reviewed goals, educated importance of HEP compliance for maximal benefits, pt able to recall current HEP stretches with cueing for increased hold time.  Session focus on spinal mobility and proximal strengthening with verbal cueing and demonstration for form and mechanics with majority of exercises.    Personal Factors and Comorbidities Age;Comorbidity 2;Time since onset of injury/illness/exacerbation    Comorbidities CAD, CABG,CHF, HTN    Examination-Activity Limitations Carry;Lift;Locomotion Level;Stand;Stairs    Examination-Participation Restrictions Cleaning;Yard Work;Shop    Stability/Clinical Decision Making Evolving/Moderate complexity    Clinical Decision Making Moderate    Rehab Potential Good    PT Frequency 2x / week    PT Duration 6 weeks    PT Treatment/Interventions Patient/family education;Manual techniques;Therapeutic  activities;Functional mobility training;Stair training;Gait training;Therapeutic exercise;Balance training;Neuromuscular re-education    PT Next Visit Plan Add theraband decompression exercises, give additional HEP if good form.  hip excursion, thoracic excursion, all 4 piriformis stretch, quad stretch manual to assist in imporving back mobility, bridging, progress decompressive , gluteal and balance activities    PT Home Exercise Plan single knee to chest. double, knee to chest and active hamstring stretch; 3/30: decompression 1-5    Consulted and Agree with Plan of Care Patient           Patient will benefit from skilled therapeutic intervention in order to improve the following deficits and impairments:  Decreased activity tolerance,Decreased balance,Decreased range of motion,Difficulty walking,Decreased strength,Increased fascial restricitons,Impaired flexibility,Pain  Visit Diagnosis: Difficulty in walking, not elsewhere classified  Muscle weakness (generalized)  Radiculopathy, lumbar region     Problem List Patient Active Problem List   Diagnosis Date Noted  . S/P left knee arthroscopy 07/15/19 07/31/2019  . Lateral meniscus derangement, left   . Derangement of posterior horn of medial meniscus of left knee   . OSA (obstructive sleep apnea) 09/02/2018  . Obesity (BMI 30-39.9) 09/02/2018  . Aftercare following surgery of the circulatory system, [place] 04/15/2014  . Occlusion and stenosis of carotid artery without mention of cerebral infarction 03/20/2012  . 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  S/P carotid endarterectomy 02/29/2012  . 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  . MI (myocardial infarction) ([place])   . Heart attack ([place])   . HTN (hypertension) 12/04/2011  . Hyperlipemia 12/04/2011  . GERD (gastroesophageal reflux disease) 12/04/2011  . CAD (coronary artery disease) 12/04/2011  . Hypothyroidism, postsurgical 07/13/2011  . Thyroid nodule, uninodular 02/22/2011   Ihor Austin, LPTA/CLT; CBIS [phone]  Aldona Lento 11/10/2020, 3:01 PM  [facility], [place], [address] Phone: [phone]   Fax:  [phone]  Name: [name] [name] MRN: [id number] Date of Birth: Jan 16, 1941

## 2020-11-15 ENCOUNTER — Other Ambulatory Visit: Payer: Self-pay

## 2020-11-15 ENCOUNTER — Ambulatory Visit (HOSPITAL_COMMUNITY): Payer: Medicare HMO | Attending: Neurology | Admitting: Physical Therapy

## 2020-11-15 DIAGNOSIS — M6281 Muscle weakness (generalized): Secondary | ICD-10-CM | POA: Insufficient documentation

## 2020-11-15 DIAGNOSIS — M5416 Radiculopathy, lumbar region: Secondary | ICD-10-CM | POA: Insufficient documentation

## 2020-11-15 DIAGNOSIS — R262 Difficulty in walking, not elsewhere classified: Secondary | ICD-10-CM | POA: Diagnosis not present

## 2020-11-15 NOTE — Therapy (Signed)
[facility] [address] [place], [place], [address] Phone: [phone]   Fax:  [phone]  Physical Therapy Treatment  Patient Details  Name: [name] [name] MRN: [id number] Date of Birth: 08-04-1941 Referring Provider (PT): Arvin Collard , patel   Encounter Date: 11/15/2020   PT End of Session - 11/15/20 1605    Visit Number 3    Number of Visits 12    Date for PT Re-Evaluation 12/20/20    Authorization Type [employer]    PT Start Time 1540    PT Stop Time 1620    PT Time Calculation (min) 40 min    Activity Tolerance Patient tolerated treatment well    Behavior During Therapy [facility] for tasks assessed/performed           Past Medical History:  Diagnosis Date  . Arthritis   . CAD (coronary artery disease)   . CAD (coronary artery disease) 12/04/2011   Left Main Disease with 3- vessel CAD   . CAD (coronary artery disease)    seeing Dr Harl Bowie  . CHF (congestive heart failure) ([place])   . Deviated septum   . Dyslipidemia   . GERD (gastroesophageal reflux disease)   . Heart attack ([place])   . Hyperlipidemia   . Hypertension   . Hypothyroidism   . MI (myocardial infarction) ([place]) 1997  . Neuropathy   . Obesity (BMI 30-39.9) 09/02/2018  . PONV (postoperative nausea and vomiting)    has in past, no problems after most recent surgery  . 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 S/P carotid endarterectomy 02/29/2012   Right CEA for asymptomatic severe right ICA stenosis   . 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 Shortness of breath    with exertion  . Sleep apnea    CPAP, sleep study at [facility]  . 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   80-99% stenosis RICA, asymptomatic  . Thyroid nodule   . Wears glasses    or contacts    Past Surgical History:  Procedure Laterality Date  . ADENOIDECTOMY    . CARDIAC CATHETERIZATION     02/22/12  . CARDIOVASCULAR  STRESS TEST  7/13  . CAROTID ENDARTERECTOMY    . COLONOSCOPY  15-20years and 12/04/11   Done at [place]  12/11/2011   Procedure: COLONOSCOPY;  Surgeon: Rogene Houston, MD;  Location[facility];  Service: Endoscopy;  Laterality: N/A;  830  . CORONARY ARTERY BYPASS GRAFT  02/29/2012   Procedure: CORONARY ARTERY BYPASS GRAFTING (CABG);  Surgeon: Rexene Alberts, MD;  Location: [place];  Service: Open Heart Surgery;  Laterality: N/A;  times three using Left Internal Mammary Artery and Right Greater Saphenous Vein Graft Harvested Endoscopically  . CORONARY STENT PLACEMENT    . ENDARTERECTOMY  02/29/2012   Procedure: ENDARTERECTOMY CAROTID;  Surgeon: Angelia Mould, MD;  Location: [place];  Service: Vascular;  Laterality: Right;  . FOOT SURGERY  06/30/11   artificial joint in big toe right foot   . KNEE ARTHROSCOPY WITH MEDIAL MENISECTOMY Left 07/15/2019   Procedure: KNEE ARTHROSCOPY WITH MEDIAL MENISCECTOMY AND LATERAL MENISCECTOMY;  Surgeon: Carole Civil, MD;  Location: [facility];  Service: Orthopedics;  Laterality: Left;  . LEFT HEART CATHETERIZATION WITH CORONARY ANGIOGRAM N/A 02/20/2012   Procedure: LEFT HEART CATHETERIZATION WITH CORONARY ANGIOGRAM;  Surgeon: Marcello Moores  Floyce Stakes, MD;  Location: [facility] CATH LAB;  Service: Cardiovascular;  Laterality: N/A;  . NASAL SEPTUM SURGERY    . [place] thyroidectomy  02/06/2011  . TRANSESOPHAGEAL ECHOCARDIOGRAM    . UPPER GASTROINTESTINAL ENDOSCOPY  35-40 years ago   [facility]    There were no vitals filed for this visit.   Subjective Assessment - 11/15/20 1541    Subjective PT states that he is doing his exercises but they do not seem to be helping.  Most of his pain is in his knee.s    Pertinent History HTN, MI CAD, CAby CHF, surgery on Rt foot replaced jt of big toe    Limitations Standing;House hold activities;Walking;Lifting    How long can you sit comfortably? no problem    How long can you stand comfortably? 30 minutes     How long can you walk comfortably? 20 minutes partially due to toe bothering him so much    Diagnostic tests MRI severe foraminal narrowing L3-L5; moderate L1 to L 3    Currently in Pain? Yes    Pain Score 7     Pain Location Knee    Pain Orientation Right;Left    Pain Type Chronic pain    Pain Onset More than a month ago    Pain Frequency Constant    Aggravating Factors  walking    Pain Relieving Factors sitting    Effect of Pain on Daily Activities limits                             [facility] Adult PT Treatment/Exercise - 11/15/20 0001      Exercises   Exercises Lumbar      Lumbar Exercises: Stretches   Single Knee to Chest Stretch Right;Left;3 reps;30 seconds    Double Knee to Chest Stretch 30 seconds;3 reps    Hip Flexor Stretch 2 reps;30 seconds    Hip Flexor Stretch Limitations --    Piriformis Stretch Right;Left;2 reps;30 seconds    Piriformis Stretch Limitations all 4    Other Lumbar Stretch Exercise slant board stretch      Lumbar Exercises: Standing   Functional Squats Limitations 3D hip excursion (cueing for mechanics with squats infront of chair)    Other Standing Lumbar Exercises wall arch x 10    Other Standing Lumbar Exercises rocker board for warm up x 2'      Lumbar Exercises: Seated   Other Seated Lumbar Exercises thoracic excursion x 3      Lumbar Exercises: Prone   Straight Leg Raise 10 reps      Manual Therapy   Manual Therapy Manual Traction;Soft tissue mobilization    Manual therapy comments done seperate from all other aspects of treatment    Soft tissue mobilization to decrese tension improve mobility    Manual Traction done prone and supine                    PT Short Term Goals - 11/15/20 1608      PT SHORT TERM GOAL #1   Title PT to be I in HEP to allow decreased nerve irritation as shown by radicular sx going to knee level only    Time 3    Period Weeks    Status On-going    Target Date 11/29/20      PT  SHORT TERM GOAL #2   Title PT to be abe able to walk for  40 minutes without having to rest.    Time 3    Period Weeks    Status On-going      PT SHORT TERM GOAL #3   Title PT hip extensor strength to be increased by 1/2 grade to allow getting up from low couches/cars to be effortless.    Time 3    Period Weeks    Status On-going             PT Long Term Goals - 11/15/20 1609      PT LONG TERM GOAL #1   Title PT to be I in an advance HEP to allow pt radicular sx to be to mid thigh only    Time 6    Period Weeks    Status On-going      PT LONG TERM GOAL #2   Title PT to be hip extension strength to be at least a 4+/5 to allow pt to feel confident going up and down 12 steps without the use of UE    Time 6    Period Weeks    Status On-going      PT LONG TERM GOAL #3   Title PT to be able to single leg stance on both legs for at least 30" to reduce risk of falls    Time 6    Period Weeks    Status On-going                 Plan - 11/15/20 1605    Clinical Impression Statement Added manual soft tissue as well as traction to attempt to improve mobility. Prone hip extrenstion added to improve gluteal strength.  Continued with stretching to improve mobility    Personal Factors and Comorbidities Age;Comorbidity 2;Time since onset of injury/illness/exacerbation    Comorbidities CAD, CABG,CHF, HTN    Examination-Activity Limitations Carry;Lift;Locomotion Level;Stand;Stairs    Examination-Participation Restrictions Cleaning;Yard Work;Shop    Stability/Clinical Decision Making Evolving/Moderate complexity    Rehab Potential Good    PT Frequency 2x / week    PT Duration 6 weeks    PT Treatment/Interventions Patient/family education;Manual techniques;Therapeutic activities;Functional mobility training;Stair training;Gait training;Therapeutic exercise;Balance training;Neuromuscular re-education    PT Next Visit Plan Add theraband decompression exercises, give additional HEP if  good form.  , progress decompressive , gluteal and balance activities    PT Home Exercise Plan single knee to chest. double, knee to chest and active hamstring stretch; 3/30: decompression 1-5    Consulted and Agree with Plan of Care Patient           Patient will benefit from skilled therapeutic intervention in order to improve the following deficits and impairments:  Decreased activity tolerance,Decreased balance,Decreased range of motion,Difficulty walking,Decreased strength,Increased fascial restricitons,Impaired flexibility,Pain  Visit Diagnosis: Difficulty in walking, not elsewhere classified  Muscle weakness (generalized)     Problem List Patient Active Problem List   Diagnosis Date Noted  . S/P left knee arthroscopy 07/15/19 07/31/2019  . Lateral meniscus derangement, left   . Derangement of posterior horn of medial meniscus of left knee   . OSA (obstructive sleep apnea) 09/02/2018  . Obesity (BMI 30-39.9) 09/02/2018  . Aftercare following surgery of the circulatory system, [place] 04/15/2014  . Occlusion and stenosis of carotid artery without mention of cerebral infarction 03/20/2012  . 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 S/P carotid endarterectomy 02/29/2012  . 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  . MI (myocardial infarction) ([place] [address])   . Heart attack ([place])   .  HTN (hypertension) 12/04/2011  . Hyperlipemia 12/04/2011  . GERD (gastroesophageal reflux disease) 12/04/2011  . CAD (coronary artery disease) 12/04/2011  . Hypothyroidism, postsurgical 07/13/2011  . Thyroid nodule, uninodular 02/22/2011   Rayetta Humphrey, PT CLT [phone] 11/15/2020, 4:20 PM  [facility] [address] [place], [place], [address] Phone: [phone]   Fax:  [phone]  Name: ELEK HOLDERNESS MRN: [id number] Date of Birth: 10-02-1940

## 2020-11-16 ENCOUNTER — Other Ambulatory Visit: Payer: Self-pay

## 2020-11-16 ENCOUNTER — Ambulatory Visit (INDEPENDENT_AMBULATORY_CARE_PROVIDER_SITE_OTHER): Payer: Medicare HMO | Admitting: Neurology

## 2020-11-16 DIAGNOSIS — M48061 Spinal stenosis, lumbar region without neurogenic claudication: Secondary | ICD-10-CM | POA: Diagnosis not present

## 2020-11-16 DIAGNOSIS — R202 Paresthesia of skin: Secondary | ICD-10-CM

## 2020-11-16 DIAGNOSIS — R292 Abnormal reflex: Secondary | ICD-10-CM | POA: Diagnosis not present

## 2020-11-16 DIAGNOSIS — G5622 Lesion of ulnar nerve, left upper limb: Secondary | ICD-10-CM | POA: Diagnosis not present

## 2020-11-16 NOTE — Progress Notes (Signed)
Follow-up Visit   Date: 11/16/20   [name] [name] MRN: [id number] DOB: 04-Mar-1941   Interim History: [name] [name] is a 80 y.o. right-handed Caucasian male with CAD s/p CABG, CHF, HTN, HPL, and s/p R CEA returning to the clinic for follow-up of bilateral leg numbness and right hand weakness.  The patient was accompanied to the clinic by self.  History of present illness: He underwent CABG in 2013 and following this. he began having numbness, tingling, in the feet which has slowly extended into his lower legs.  Symptoms are constant with no exacerbating or alleviating factors.  He denies weakness.  He has some imbalance, no falls, and walks unassisted.  He was previously evaluated at [facility] and had NCS/EMG of the legs which was normal.  He was being treated symptomatically with gabapentin, Lyrica, and Horizant, which did not help.  He opted not to take any medication and is here for second opinion.  He denies low back pain, cramps, or radiculopathy pain.  No history of diabetes or alcohol.  He also has tingling in the right hand, which has been long standing.  He notices this more when sleeping or resting on the arm when watching TV.  UPDATE 11/16/2020:  He has started PT the past two weeks.  No new neurological symptoms.  He is here for EDX today and review results of MRI.   Medications:  Current Outpatient Medications on File Prior to Visit  Medication Sig Dispense Refill  . aspirin EC 81 MG tablet Take 81 mg by mouth daily.    Marland Kitchen atorvastatin (LIPITOR) 40 MG tablet TAKE 1 TABLET EVERY DAY (NEED APPOINTMENT FOR FURTHER REFILLS) 30 tablet 0  . diclofenac (VOLTAREN) 75 MG EC tablet TAKE (1) TABLET BY MOUTH TWICE DAILY. 60 tablet 0  . Esomeprazole Magnesium (NEXIUM PO) Take 20 mg by mouth daily.     . fluorouracil (EFUDEX) 5 % cream Apply topically at bedtime. 40 g 1  . metoprolol (LOPRESSOR) 50 MG tablet Take 1 tablet (50 mg total) by mouth 2 (two) times daily. 180 tablet 1  .  Multiple Vitamins-Minerals (PRESERVISION AREDS 2 PO) Take 1 tablet by mouth 2 (two) times daily.     . Omega-3 Fatty Acids (FISH OIL) 1000 MG CPDR Take 1,000 mg by mouth 2 (two) times daily.     . promethazine (PHENERGAN) 12.5 MG tablet Take 1 tablet (12.5 mg total) by mouth every 6 (six) hours as needed for nausea or vomiting. 30 tablet 0  . Psyllium (METAMUCIL PO) Take 3 capsules by mouth daily.     . ramipril (ALTACE) 10 MG capsule Take 10 mg by mouth daily.    Marland Kitchen SYNTHROID 112 MCG tablet Take 112 mcg by mouth daily before breakfast.     . zolpidem (AMBIEN) 10 MG tablet Take 10 mg by mouth at bedtime as needed for sleep.     No current facility-administered medications on file prior to visit.    Allergies: No Known Allergies   Neurological Exam: MENTAL STATUS including orientation to time, place, person, recent and remote memory, attention span and concentration, language, and fund of knowledge is normal.  Speech is not dysarthric.  MOTOR:  Motor strength is 5/5 in all extremities, except right finger abductors (4/5).  Severe right FDI and ADM atrophy, no fasciculations or abnormal movements.  No pronator drift.  Tone is normal.    MSRs:  Reflexes are 2+/4 throughout  SENSORY:  Intact to vibration throughout.  COORDINATION/GAIT:  Gait narrow based and stable.   Data: MRI lumbar spine 10/19/2020: Moderate spinal canal and severe bilateral neural foraminal narrowing at the L3-4, L4-5 levels.  Moderate to severe bilateral L1-2, L2-3 and L5-S1 neural foraminal narrowing.  NCS/EMG of the arms 11/16/2020: 1. Right ulnar neuropathy with slowing across the elbow, with demyelinating and axonal features.  Overall, these findings are very severe in degree electrically. 2. Right median neuropathy at or distal to the wrist (moderate), consistent with a clinical diagnosis of carpal tunnel syndrome.   3. Chronic multilevel lumbar radiculopathies affecting the L2-L5 nerve root/segment on the left,  moderate. There is no evidence of a sensorimotor polyneuropathy affecting the right upper or lower extremities.  IMPRESSION/PLAN: 1. Right ulnar neuropathy at the elbow, severe.  - Management options discussed including avoidance of nerve compression elbow as well as surgical decompression.  - At this time, he would prefer conservative therapies.  With the severity of findings, I doubt he will have an improvement and anticipate the next step will be surgical decompression.  2.  Bilateral leg paresthesias due to multilevel lumbar radiculopathies (L2-L5), as evidenced by EMG and imaging.  Patient reassured that there is no evidence of a peripheral neuropathy.  - He is currently in therapy and would like to wait to see if there is any improvement with this, prior to seeing a Psychologist, sport and exercise.  3.  Bilateral carpal tunnel syndrome  - Continue conservative therapies with splinting  Return to clinic in 2 months.    Thank you for allowing me to participate in patient's care.  If I can answer any additional questions, I would be pleased to do so.    Sincerely,    Dao Memmott K. Posey Pronto, DO

## 2020-11-16 NOTE — Procedures (Signed)
[facility] Neurology  [place], [place]  [place], [place] [address] Tel: [phone] Fax:  [phone] Test Date:  11/16/2020  Patient: [name] [name] DOB: Dec 22, 1940 Physician: Narda Amber, DO  Sex: Male Height: 6' " Ref Phys: Narda Amber, DO  ID#: [id number]   Technician:    Patient Complaints: This is a 80 year old man referred for evaluation of right hand weakness and bilateral leg paresthesias.  NCV & EMG Findings: Extensive electrodiagnostic testing of the right upper and lower extremity shows:  1. Right median sensory response shows prolonged latency (4.1 ms).  Right ulnar sensory response is absent.  Right sural and superficial peroneal sensory responses are within normal limits. 2. Right median motor response shows prolonged latency (4.1 ms).  Right ulnar motor response shows severely reduced amplitude (R3.4, R0.9 mV) and decreased conduction velocity across the elbow (A Elbow-B Elbow, R23, R16 m/s).  Right peroneal and tibial motor responses are within normal limits. 3. Right tibial H reflex study is within normal limits. 4. In the right upper extremity, chronic motor axonal loss changes are seen affecting the ulnar innervated muscles, with without accompanied active denervation. 5. In the right lower extremity, chronic motor axonal loss changes are seen affecting the L2-L5 myotomes.    Impression: 1. Right ulnar neuropathy with slowing across the elbow, with demyelinating and axonal features.  Overall, these findings are very severe in degree electrically. 2. Right median neuropathy at or distal to the wrist (moderate), consistent with a clinical diagnosis of carpal tunnel syndrome.   3. Chronic multilevel lumbar radiculopathies affecting the L2-L5 nerve root/segment on the left, moderate. 4. There is no evidence of a sensorimotor polyneuropathy affecting the right upper or lower extremities.   ___________________________ Narda Amber, DO    Nerve Conduction  Studies Anti Sensory Summary Table   Stim Site NR Peak (ms) Norm Peak (ms) P-T Amp (V) Norm P-T Amp  Right Median Anti Sensory (2nd Digit)  33C  Wrist    4.1 <3.8 16.3 >10  Right Sup Peroneal Anti Sensory (Ant Lat Mall)  33C  12 cm    2.6 <4.6 8.3 >3  Right Sural Anti Sensory (Lat Mall)  33C  Calf    2.9 <4.6 13.7 >3  Right Ulnar Anti Sensory (5th Digit)  33C  Wrist NR  <3.2  >5   Motor Summary Table   Stim Site NR Onset (ms) Norm Onset (ms) O-P Amp (mV) Norm O-P Amp Site1 Site2 Delta-0 (ms) Dist (cm) Vel (m/s) Norm Vel (m/s)  Right Median Motor (Abd Poll Brev)  33C  Wrist    4.1 <4.0 5.5 >5 Elbow Wrist 5.1 29.0 57 >50  Elbow    9.2  5.1         Right Peroneal Motor (Ext Dig Brev)  33C  Ankle    4.0 <6.0 3.1 >2.5 B Fib Ankle 7.3 38.0 52 >40  B Fib    11.3  2.8  Poplt B Fib 1.7 10.0 59 >40  Poplt    13.0  2.7         Right Tibial Motor (Abd Hall Brev)  33C  Ankle    3.7 <6.0 7.3 >4 Knee Ankle 8.9 41.0 46 >40  Knee    12.6  4.6         Right Ulnar Motor (Abd Dig Minimi)  33C  Wrist    2.8 <3.1 3.4 >7 B Elbow Wrist 4.2 26.0 62 >50  B Elbow    7.0  2.6  A Elbow B Elbow 4.4 10.0 23 >50  A Elbow    11.4  1.8         Right Ulnar (FDI) Motor (1st DI)  33C  Wrist    4.5 <4.5 0.9 >7 B Elbow Wrist 4.6 24.0 52 >50  B Elbow    9.1  0.9  A Elbow B Elbow 6.2 10.0 16 >50  A Elbow    15.3  0.9          H Reflex Studies   NR H-Lat (ms) Lat Norm (ms) L-R H-Lat (ms)  Right Tibial (Gastroc)  33C     34.83 <35    EMG   Side Muscle Ins Act Fibs Psw Fasc Number Recrt Dur Dur. Amp Amp. Poly Poly. Comment  Right Deltoid Nml Nml Nml Nml Nml Nml Nml Nml Nml Nml Nml Nml N/A  Right Gastroc Nml Nml Nml Nml Nml Nml Nml Nml Nml Nml Nml Nml N/A  Right BicepsFemS Nml Nml Nml Nml Nml Nml Nml Nml Nml Nml Nml Nml N/A  Right PronatorTeres Nml Nml Nml Nml Nml Nml Nml Nml Nml Nml Nml Nml N/A  Right Biceps Nml Nml Nml Nml Nml Nml Nml Nml Nml Nml Nml Nml N/A  Right Triceps Nml Nml Nml Nml Nml Nml  Nml Nml Nml Nml Nml Nml N/A  Right Abd Poll Brev Nml Nml Nml Nml 1- Rapid Some 1+ Some 1+ Some 1+ N/A  Right 1stDorInt Nml Nml Nml Nml [facility] Rapid All 1+ All 1+ All 2+ ATR  Right AntTibialis Nml Nml Nml Nml 1- Rapid Some 1+ Some 1+ Some 1+ N/A  Right ABD Dig Min Nml Nml Nml Nml 3- Rapid All 2+ All 2+ All 2+ ATR  Right FlexCarpiUln Nml Nml Nml Nml 2- Rapid Many 2+ Some 1+ Some 1+ N/A  Right AdductorLong Nml Nml Nml Nml 1- Rapid Many 1+ Many 1+ Many 1+ N/A  Right Flex Dig Long Nml Nml Nml Nml 2- Rapid Some 1+ Some 1+ Some 1+ N/A  Right RectFemoris Nml Nml Nml Nml 1- Rapid Many 1+ Many 1+ Many 1+ N/A  Right GluteusMed Nml Nml Nml Nml 1- Rapid Some 1+ Some 1+ Some 1+ N/A      Waveforms:

## 2020-11-17 ENCOUNTER — Encounter (HOSPITAL_COMMUNITY): Payer: Self-pay

## 2020-11-17 ENCOUNTER — Ambulatory Visit (HOSPITAL_COMMUNITY): Payer: Medicare HMO

## 2020-11-17 DIAGNOSIS — R262 Difficulty in walking, not elsewhere classified: Secondary | ICD-10-CM | POA: Diagnosis not present

## 2020-11-17 DIAGNOSIS — M5416 Radiculopathy, lumbar region: Secondary | ICD-10-CM | POA: Diagnosis not present

## 2020-11-17 DIAGNOSIS — M6281 Muscle weakness (generalized): Secondary | ICD-10-CM | POA: Diagnosis not present

## 2020-11-17 NOTE — Therapy (Signed)
[place], [place], [address] Phone: [phone]   Fax:  [phone]  Physical Therapy Treatment  Patient Details  Name: [name] [name] MRN: [id number] Date of Birth: [birth year] Referring Provider (PT): Arvin Collard , patel   Encounter Date: 11/17/2020   PT End of Session - 11/17/20 1053    Visit Number 4    Number of Visits 12    Date for PT Re-Evaluation 12/20/20    Authorization Type [employer]    PT Start Time 1048    PT Stop Time 1128    PT Time Calculation (min) 40 min    Activity Tolerance Patient tolerated treatment well    Behavior During Therapy [facility] for tasks assessed/performed           Past Medical History:  Diagnosis Date  . Arthritis   . CAD (coronary artery disease)   . CAD (coronary artery disease) 12/04/2011   Left Main Disease with 3- vessel CAD   . CAD (coronary artery disease)    seeing Dr Harl Bowie  . CHF (congestive heart failure) ([place])   . Deviated septum   . Dyslipidemia   . GERD (gastroesophageal reflux disease)   . Heart attack ([place])   . Hyperlipidemia   . Hypertension   . Hypothyroidism   . MI (myocardial infarction) ([place]) 1997  . Neuropathy   . Obesity (BMI 30-39.9) 09/02/2018  . PONV (postoperative nausea and vomiting)    has in past, no problems after most recent surgery  . 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 S/P carotid endarterectomy 02/29/2012   Right CEA for asymptomatic severe right ICA stenosis   . 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 Shortness of breath    with exertion  . Sleep apnea    CPAP, sleep study at [facility]  . 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   80-99% stenosis RICA, asymptomatic  . Thyroid nodule   . Wears glasses    or contacts    Past Surgical History:  Procedure Laterality Date  . ADENOIDECTOMY    . CARDIAC CATHETERIZATION     02/22/12  . CARDIOVASCULAR  STRESS TEST  7/13  . CAROTID ENDARTERECTOMY    . COLONOSCOPY  15-20years and 12/04/11   Done at [place]  12/11/2011   Procedure: COLONOSCOPY;  Surgeon: Rogene Houston, MD;  [facility];  Service: Endoscopy;  Laterality: N/A;  830  . CORONARY ARTERY BYPASS GRAFT  02/29/2012   Procedure: CORONARY ARTERY BYPASS GRAFTING (CABG);  Surgeon: Rexene Alberts, MD;  Location: [place];  Service: Open Heart Surgery;  Laterality: N/A;  times three using Left Internal Mammary Artery and Right Greater Saphenous Vein Graft Harvested Endoscopically  . CORONARY STENT PLACEMENT    . ENDARTERECTOMY  02/29/2012   Procedure: ENDARTERECTOMY CAROTID;  Surgeon: Angelia Mould, MD;  Location: [place];  Service: Vascular;  Laterality: Right;  . FOOT SURGERY  06/30/11   artificial joint in big toe right foot   . KNEE ARTHROSCOPY WITH MEDIAL MENISECTOMY Left 07/15/2019   Procedure: KNEE ARTHROSCOPY WITH MEDIAL MENISCECTOMY AND LATERAL MENISCECTOMY;  Surgeon: Carole Civil, MD;  [facility];  Service: Orthopedics;  Laterality: Left;  . LEFT HEART CATHETERIZATION WITH CORONARY ANGIOGRAM N/A 02/20/2012   Procedure: LEFT HEART CATHETERIZATION WITH CORONARY ANGIOGRAM;  Surgeon: Marcello Moores  Floyce Stakes, MD;  Location: [facility] CATH LAB;  Service: Cardiovascular;  Laterality: N/A;  . NASAL SEPTUM SURGERY    . Totol thyroidectomy  02/06/2011  . TRANSESOPHAGEAL ECHOCARDIOGRAM    . UPPER GASTROINTESTINAL ENDOSCOPY  35-40 years ago   [facility]    There were no vitals filed for this visit.   Subjective Assessment - 11/17/20 1051    Subjective Reports he feels about the same.  Reports compliance wiht HEP, ran out of time to complete yesterday as had MD apt in [place].  Pt stated he saw Dr Posey Pronto yesterday with some testing complete indicating DJD, pinch nerve and arthritis.  Most of his pain is in his knees Lt>Rt    Currently in Pain? Yes    Pain Score 5     Pain Location Knee    Pain  Orientation Right;Left    Pain Descriptors / Indicators Dull    Pain Type Chronic pain    Pain Onset More than a month ago    Pain Frequency Constant    Aggravating Factors  walking    Pain Relieving Factors sitting    Effect of Pain on Daily Activities limits              [facility] PT Assessment - 11/17/20 0001      Assessment   Medical Diagnosis Radicular lumbar pain    Referring Provider (PT) Arvin Collard , patel    Onset Date/Surgical Date --   chronic   Next MD Visit June    Prior Therapy none      Precautions   Precautions None                         [facility] Adult PT Treatment/Exercise - 11/17/20 0001      Posture/Postural Control   Posture/Postural Control Postural limitations    Postural Limitations Decreased lumbar lordosis;Decreased thoracic kyphosis;Posterior pelvic tilt      Exercises   Exercises Lumbar      Lumbar Exercises: Stretches   Prone on Elbows Stretch Limitations 2 minutes    Piriformis Stretch Right;Left;2 reps;30 seconds    Piriformis Stretch Limitations all 4      Lumbar Exercises: Standing   Functional Squats 10 reps    Functional Squats Limitations 3D hip excursion (cueing for mechanics with squats infront of chair)    Other Standing Lumbar Exercises wall arch x 10    Other Standing Lumbar Exercises rocker board for warm up x 2'      Lumbar Exercises: Supine   Bridge 10 reps    Other Supine Lumbar Exercises Decompression with RTB 1-4 10 x    Other Supine Lumbar Exercises Cueing for breathing through session with exertion      Lumbar Exercises: Prone   Straight Leg Raise 10 reps      Manual Therapy   Manual Therapy Manual Traction    Manual therapy comments done seperate from all other aspects of treatment    Soft tissue mobilization to decrese tension improve mobility    Manual Traction done wiht ball supine 2x 2'                    PT Short Term Goals - 11/15/20 1608      PT SHORT TERM GOAL #1   Title PT to be  I in HEP to allow decreased nerve irritation as shown by radicular sx going to knee level only    Time 3  Period Weeks    Status On-going    Target Date 11/29/20      PT SHORT TERM GOAL #2   Title PT to be abe able to walk for 40 minutes without having to rest.    Time 3    Period Weeks    Status On-going      PT SHORT TERM GOAL #3   Title PT hip extensor strength to be increased by 1/2 grade to allow getting up from low couches/cars to be effortless.    Time 3    Period Weeks    Status On-going             PT Long Term Goals - 11/15/20 1609      PT LONG TERM GOAL #1   Title PT to be I in an advance HEP to allow pt radicular sx to be to mid thigh only    Time 6    Period Weeks    Status On-going      PT LONG TERM GOAL #2   Title PT to be hip extension strength to be at least a 4+/5 to allow pt to feel confident going up and down 12 steps without the use of UE    Time 6    Period Weeks    Status On-going      PT LONG TERM GOAL #3   Title PT to be able to single leg stance on both legs for at least 30" to reduce risk of falls    Time 6    Period Weeks    Status On-going                 Plan - 11/17/20 1302    Clinical Impression Statement Continued with mobility exercises with verbal and tactile cueing for proper mechanics.  Added decompression exercises with theraband, pt able to demonstrate appropriate mechanics following initial cueing so added to HEP.  Positive reports following manual to improve mobility and used theraball with manual traction.    Personal Factors and Comorbidities Age;Comorbidity 2;Time since onset of injury/illness/exacerbation    Comorbidities CAD, CABG,CHF, HTN    Examination-Activity Limitations Carry;Lift;Locomotion Level;Stand;Stairs    Examination-Participation Restrictions Cleaning;Yard Work;Shop    Stability/Clinical Decision Making Evolving/Moderate complexity    Clinical Decision Making Moderate    Rehab Potential Good     PT Frequency 2x / week    PT Duration 6 weeks    PT Treatment/Interventions Patient/family education;Manual techniques;Therapeutic activities;Functional mobility training;Stair training;Gait training;Therapeutic exercise;Balance training;Neuromuscular re-education    PT Next Visit Plan Progress gluteal and balance activities, manual PRN.    PT Home Exercise Plan single knee to chest. double, knee to chest and active hamstring stretch; 3/30: decompression 1-5; 11/17/20: Decompression exercise with RTB.    Consulted and Agree with Plan of Care Patient           Patient will benefit from skilled therapeutic intervention in order to improve the following deficits and impairments:  Decreased activity tolerance,Decreased balance,Decreased range of motion,Difficulty walking,Decreased strength,Increased fascial restricitons,Impaired flexibility,Pain  Visit Diagnosis: Difficulty in walking, not elsewhere classified  Muscle weakness (generalized)  Radiculopathy, lumbar region     Problem List Patient Active Problem List   Diagnosis Date Noted  . S/P left knee arthroscopy 07/15/19 07/31/2019  . Lateral meniscus derangement, left   . Derangement of posterior horn of medial meniscus of left knee   . OSA (obstructive sleep apnea) 09/02/2018  . Obesity (BMI 30-39.9) 09/02/2018  . Aftercare following  surgery of the circulatory system, NEC 04/15/2014  . Occlusion and stenosis of carotid artery without mention of cerebral infarction 03/20/2012  . 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 S/P carotid endarterectomy 02/29/2012  . 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  . MI (myocardial infarction) ([place])   . Heart attack ([place])   . HTN (hypertension) 12/04/2011  . Hyperlipemia 12/04/2011  . GERD (gastroesophageal reflux disease) 12/04/2011  . CAD (coronary artery disease) 12/04/2011  . Hypothyroidism, postsurgical 07/13/2011  . Thyroid nodule, uninodular 02/22/2011   Ihor Austin, LPTA/CLT;  CBIS [phone]  Aldona Lento 11/17/2020, 4:58 PM  [facility] [address] [place], [place], [address] Phone: [phone]   Fax:  [phone]  Name: [name] [name] MRN: [id number] Date of Birth: [birth year]

## 2020-11-22 ENCOUNTER — Ambulatory Visit (HOSPITAL_COMMUNITY): Payer: Medicare HMO | Admitting: Physical Therapy

## 2020-11-22 ENCOUNTER — Other Ambulatory Visit: Payer: Self-pay

## 2020-11-22 DIAGNOSIS — R262 Difficulty in walking, not elsewhere classified: Secondary | ICD-10-CM

## 2020-11-22 DIAGNOSIS — M5416 Radiculopathy, lumbar region: Secondary | ICD-10-CM

## 2020-11-22 DIAGNOSIS — M6281 Muscle weakness (generalized): Secondary | ICD-10-CM | POA: Diagnosis not present

## 2020-11-22 NOTE — Therapy (Signed)
[place], [place], [address] Phone: [phone]   Fax:  [phone]  Physical Therapy Treatment  Patient Details  Name: [name] [name] MRN: [id number] Date of Birth: [birth year] Referring Provider (PT): Arvin Collard , patel   Encounter Date: 11/22/2020   PT End of Session - 11/22/20 1431    Visit Number 5    Number of Visits 12    Date for PT Re-Evaluation 12/20/20    Authorization Type [employer]    Authorization Time Period Approved for 12 visits from 3/28-->12/20/20    Authorization - Visit Number 5    Authorization - Number of Visits 12    Progress Note Due on Visit 10    PT Start Time 1400    PT Stop Time 1444    PT Time Calculation (min) 44 min    Activity Tolerance Patient tolerated treatment well    Behavior During Therapy [facility] for tasks assessed/performed           Past Medical History:  Diagnosis Date  . Arthritis   . CAD (coronary artery disease)   . CAD (coronary artery disease) 12/04/2011   Left Main Disease with 3- vessel CAD   . CAD (coronary artery disease)    seeing Dr Harl Bowie  . CHF (congestive heart failure) ([place])   . Deviated septum   . Dyslipidemia   . GERD (gastroesophageal reflux disease)   . Heart attack ([place])   . Hyperlipidemia   . Hypertension   . Hypothyroidism   . MI (myocardial infarction) ([place]) 1997  . Neuropathy   . Obesity (BMI 30-39.9) 09/02/2018  . PONV (postoperative nausea and vomiting)    has in past, no problems after most recent surgery  . 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 S/P carotid endarterectomy 02/29/2012   Right CEA for asymptomatic severe right ICA stenosis   . 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 Shortness of breath    with exertion  . Sleep apnea    CPAP, sleep study at [facility]  . 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   80-99% stenosis RICA, asymptomatic  . Thyroid  nodule   . Wears glasses    or contacts    Past Surgical History:  Procedure Laterality Date  . ADENOIDECTOMY    . CARDIAC CATHETERIZATION     02/22/12  . CARDIOVASCULAR STRESS TEST  7/13  . CAROTID ENDARTERECTOMY    . COLONOSCOPY  15-20years and 12/04/11   Done at [place]  12/11/2011   Procedure: COLONOSCOPY;  Surgeon: Rogene Houston, MD;  [facility];  Service: Endoscopy;  Laterality: N/A;  830  . CORONARY ARTERY BYPASS GRAFT  02/29/2012   Procedure: CORONARY ARTERY BYPASS GRAFTING (CABG);  Surgeon: Rexene Alberts, MD;  Location: [place];  Service: Open Heart Surgery;  Laterality: N/A;  times three using Left Internal Mammary Artery and Right Greater Saphenous Vein Graft Harvested Endoscopically  . CORONARY STENT PLACEMENT    . ENDARTERECTOMY  02/29/2012   Procedure: ENDARTERECTOMY CAROTID;  Surgeon: Angelia Mould, MD;  Location: [place];  Service: Vascular;  Laterality: Right;  . FOOT SURGERY  06/30/11   artificial joint in big toe right foot   . KNEE ARTHROSCOPY WITH MEDIAL MENISECTOMY Left 07/15/2019   Procedure: KNEE ARTHROSCOPY WITH MEDIAL MENISCECTOMY AND LATERAL MENISCECTOMY;  Surgeon: Carole Civil, MD;  [facility];  Service: Orthopedics;  Laterality: Left;  . LEFT HEART CATHETERIZATION WITH CORONARY ANGIOGRAM N/A 02/20/2012   Procedure: LEFT HEART CATHETERIZATION WITH CORONARY ANGIOGRAM;  Surgeon: Troy Sine, MD;  Location: [facility] CATH LAB;  Service: Cardiovascular;  Laterality: N/A;  . NASAL SEPTUM SURGERY    . Totol thyroidectomy  02/06/2011  . TRANSESOPHAGEAL ECHOCARDIOGRAM    . UPPER GASTROINTESTINAL ENDOSCOPY  35-40 years ago   [facility]    There were no vitals filed for this visit.   Subjective Assessment - 11/22/20 1358    Subjective Pt states he was planting plants and his pain is up.   Had his nerve conduction which showed carpal tunnel.    Pertinent History HTN, MI CAD, CAby CHF, surgery on Rt foot replaced  jt of big toe    Limitations Standing;House hold activities;Walking;Lifting    How long can you sit comfortably? no problem    How long can you stand comfortably? 30 minutes    How long can you walk comfortably? 20 minutes partially due to toe bothering him so much    Diagnostic tests MRI severe foraminal narrowing L3-L5; moderate L1 to L 3    Currently in Pain? Yes    Pain Score 3     Pain Location Back    Pain Orientation Right    Pain Descriptors / Indicators Dull    Pain Type Chronic pain    Pain Onset More than a month ago    Pain Frequency Constant    Aggravating Factors  walking    Pain Relieving Factors sitting    Effect of Pain on Daily Activities limits                             [facility] Adult PT Treatment/Exercise - 11/22/20 0001      Exercises   Exercises Lumbar      Lumbar Exercises: Stretches   Active Hamstring Stretch Left;Right;2 reps;30 seconds    Single Knee to Chest Stretch Right;Left;3 reps;30 seconds    Double Knee to Chest Stretch 30 seconds;3 reps    Prone on Elbows Stretch Limitations 2 minutes      Lumbar Exercises: Standing   Functional Squats Limitations 3D hip excursion (cueing for mechanics with squats infront of chair)    Other Standing Lumbar Exercises single leg stance      Lumbar Exercises: Seated   Sit to Stand 10 reps    Other Seated Lumbar Exercises thoracic excursion x 3      Lumbar Exercises: Supine   Bridge 15 reps      Manual Therapy   Manual Therapy Manual Traction;Soft tissue mobilization;Joint mobilization    Manual therapy comments done seperate from all other aspects of treatment    Joint Mobilization to improve motion    Soft tissue mobilization to decrese tension improve mobility    Manual Traction supine                    PT Short Term Goals - 11/15/20 1608      PT SHORT TERM GOAL #1   Title PT to be I in HEP to allow decreased nerve irritation as shown by radicular sx going to knee level  only    Time 3    Period Weeks    Status On-going    Target Date 11/29/20      PT SHORT  TERM GOAL #2   Title PT to be abe able to walk for 40 minutes without having to rest.    Time 3    Period Weeks    Status On-going      PT SHORT TERM GOAL #3   Title PT hip extensor strength to be increased by 1/2 grade to allow getting up from low couches/cars to be effortless.    Time 3    Period Weeks    Status On-going             PT Long Term Goals - 11/15/20 1609      PT LONG TERM GOAL #1   Title PT to be I in an advance HEP to allow pt radicular sx to be to mid thigh only    Time 6    Period Weeks    Status On-going      PT LONG TERM GOAL #2   Title PT to be hip extension strength to be at least a 4+/5 to allow pt to feel confident going up and down 12 steps without the use of UE    Time 6    Period Weeks    Status On-going      PT LONG TERM GOAL #3   Title PT to be able to single leg stance on both legs for at least 30" to reduce risk of falls    Time 6    Period Weeks    Status On-going                 Plan - 11/22/20 1431    Clinical Impression Statement Treatment focused primarily on improving lumbar mobility as pt is very tight. Added manual soft tissue efflurage/pettrisage as well as jt mobs to improve lumbar mobility and function.  Therapist also began working core by adding SLS activity which pt needed finger hold assist to complete. Therapist added sit to stand having pt complete slowly to improve pt gluteal strength.    Personal Factors and Comorbidities Age;Comorbidity 2;Time since onset of injury/illness/exacerbation    Comorbidities CAD, CABG,CHF, HTN    Examination-Activity Limitations Carry;Lift;Locomotion Level;Stand;Stairs    Examination-Participation Restrictions Cleaning;Yard Work;Shop    Stability/Clinical Decision Making Evolving/Moderate complexity    Rehab Potential Good    PT Frequency 2x / week    PT Duration 6 weeks    PT  Treatment/Interventions Patient/family education;Manual techniques;Therapeutic activities;Functional mobility training;Stair training;Gait training;Therapeutic exercise;Balance training;Neuromuscular re-education    PT Next Visit Plan Continue to progress  gluteal and balance activities, manual PRN.    PT Home Exercise Plan single knee to chest. double, knee to chest and active hamstring stretch; 3/30: decompression 1-5; 11/17/20: Decompression exercise with RTB.    Consulted and Agree with Plan of Care Patient           Patient will benefit from skilled therapeutic intervention in order to improve the following deficits and impairments:  Decreased activity tolerance,Decreased balance,Decreased range of motion,Difficulty walking,Decreased strength,Increased fascial restricitons,Impaired flexibility,Pain  Visit Diagnosis: Difficulty in walking, not elsewhere classified  Muscle weakness (generalized)  Radiculopathy, lumbar region     Problem List Patient Active Problem List   Diagnosis Date Noted  . S/P left knee arthroscopy 07/15/19 07/31/2019  . Lateral meniscus derangement, left   . Derangement of posterior horn of medial meniscus of left knee   . OSA (obstructive sleep apnea) 09/02/2018  . Obesity (BMI 30-39.9) 09/02/2018  . Aftercare following surgery of the circulatory system, [place] 04/15/2014  .  Occlusion and stenosis of carotid artery without mention of cerebral infarction 03/20/2012  . 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 S/P carotid endarterectomy 02/29/2012  . 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  . MI (myocardial infarction) ([place])   . Heart attack ([place])   . HTN (hypertension) 12/04/2011  . Hyperlipemia 12/04/2011  . GERD (gastroesophageal reflux disease) 12/04/2011  . CAD (coronary artery disease) 12/04/2011  . Hypothyroidism, postsurgical 07/13/2011  . Thyroid nodule, uninodular 02/22/2011   Rayetta Humphrey, PT CLT [phone] 11/22/2020, 2:46 PM  [facility] [address] [place], [place], [address] Phone: [phone]   Fax:  [phone]  Name: [name] [name] MRN: [id number] Date of Birth: 1941-03-13

## 2020-11-24 ENCOUNTER — Ambulatory Visit (HOSPITAL_COMMUNITY): Payer: Medicare HMO | Admitting: Physical Therapy

## 2020-11-24 ENCOUNTER — Other Ambulatory Visit: Payer: Self-pay

## 2020-11-24 ENCOUNTER — Encounter (HOSPITAL_COMMUNITY): Payer: Self-pay | Admitting: Physical Therapy

## 2020-11-24 DIAGNOSIS — M5416 Radiculopathy, lumbar region: Secondary | ICD-10-CM

## 2020-11-24 DIAGNOSIS — R262 Difficulty in walking, not elsewhere classified: Secondary | ICD-10-CM

## 2020-11-24 DIAGNOSIS — M6281 Muscle weakness (generalized): Secondary | ICD-10-CM | POA: Diagnosis not present

## 2020-11-24 NOTE — Therapy (Signed)
[place], [place], [address] Phone: [phone]   Fax:  [phone]  Physical Therapy Treatment  Patient Details  Name: [name] [name] MRN: [id number] Date of Birth: Dec 10, 1940 Referring Provider (PT): Arvin Collard , patel   Encounter Date: 11/24/2020   PT End of Session - 11/24/20 0859    Visit Number 6    Number of Visits 12    Date for PT Re-Evaluation 12/20/20    Authorization Type [employer]    Authorization Time Period Approved for 12 visits from 3/28-->12/20/20    Authorization - Visit Number 6    Authorization - Number of Visits 12    Progress Note Due on Visit 10    PT Start Time 0830    PT Stop Time 0915    PT Time Calculation (min) 45 min    Activity Tolerance Patient tolerated treatment well    Behavior During Therapy [facility] for tasks assessed/performed           Past Medical History:  Diagnosis Date  . Arthritis   . CAD (coronary artery disease)   . CAD (coronary artery disease) 12/04/2011   Left Main Disease with 3- vessel CAD   . CAD (coronary artery disease)    seeing Dr Harl Bowie  . CHF (congestive heart failure) ([place])   . Deviated septum   . Dyslipidemia   . GERD (gastroesophageal reflux disease)   . Heart attack ([place])   . Hyperlipidemia   . Hypertension   . Hypothyroidism   . MI (myocardial infarction) ([place]) 1997  . Neuropathy   . Obesity (BMI 30-39.9) 09/02/2018  . PONV (postoperative nausea and vomiting)    has in past, no problems after most recent surgery  . 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 S/P carotid endarterectomy 02/29/2012   Right CEA for asymptomatic severe right ICA stenosis   . 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 Shortness of breath    with exertion  . Sleep apnea    CPAP, sleep study at [facility]  . 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   80-99% stenosis RICA, asymptomatic  . Thyroid  nodule   . Wears glasses    or contacts    Past Surgical History:  Procedure Laterality Date  . ADENOIDECTOMY    . CARDIAC CATHETERIZATION     02/22/12  . CARDIOVASCULAR STRESS TEST  7/13  . CAROTID ENDARTERECTOMY    . COLONOSCOPY  15-20years and 12/04/11   Done at [place]  12/11/2011   Procedure: COLONOSCOPY;  Surgeon: Rogene Houston, MD;  [facility];  Service: Endoscopy;  Laterality: N/A;  830  . CORONARY ARTERY BYPASS GRAFT  02/29/2012   Procedure: CORONARY ARTERY BYPASS GRAFTING (CABG);  Surgeon: Rexene Alberts, MD;  Location: [place];  Service: Open Heart Surgery;  Laterality: N/A;  times three using Left Internal Mammary Artery and Right Greater Saphenous Vein Graft Harvested Endoscopically  . CORONARY STENT PLACEMENT    . ENDARTERECTOMY  02/29/2012   Procedure: ENDARTERECTOMY CAROTID;  Surgeon: Angelia Mould, MD;  Location: [place];  Service: Vascular;  Laterality: Right;  . FOOT SURGERY  06/30/11   artificial joint in big toe right foot   . KNEE ARTHROSCOPY WITH MEDIAL MENISECTOMY Left 07/15/2019   Procedure: KNEE ARTHROSCOPY WITH MEDIAL MENISCECTOMY AND LATERAL MENISCECTOMY;  Surgeon: Carole Civil, MD;  [facility]: [facility];  Service: Orthopedics;  Laterality: Left;  . LEFT HEART CATHETERIZATION WITH CORONARY ANGIOGRAM N/A 02/20/2012   Procedure: LEFT HEART CATHETERIZATION WITH CORONARY ANGIOGRAM;  Surgeon: Troy Sine, MD;  Location: [facility] CATH LAB;  Service: Cardiovascular;  Laterality: N/A;  . NASAL SEPTUM SURGERY    . Totol thyroidectomy  02/06/2011  . TRANSESOPHAGEAL ECHOCARDIOGRAM    . UPPER GASTROINTESTINAL ENDOSCOPY  35-40 years ago   [facility]    There were no vitals filed for this visit.   Subjective Assessment - 11/24/20 0832    Subjective Pt states that his back is feeling pretty good.  He has been doing his exercises.    Pertinent History HTN, MI CAD, CAby CHF, surgery on Rt foot replaced jt of big toe     Limitations Standing;House hold activities;Walking;Lifting    How long can you sit comfortably? no problem    How long can you stand comfortably? 30 minutes    How long can you walk comfortably? 20 minutes partially due to toe bothering him so much    Diagnostic tests MRI severe foraminal narrowing L3-L5; moderate L1 to L 3    Currently in Pain? Yes    Pain Score 2     Pain Location Back    Pain Orientation Lower    Pain Descriptors / Indicators Aching    Pain Type Chronic pain    Pain Onset More than a month ago    Pain Frequency Constant    Aggravating Factors  WB    Pain Relieving Factors sitting                             [facility] Adult PT Treatment/Exercise - 11/24/20 0001      Exercises   Exercises Lumbar      Lumbar Exercises: Standing   Functional Squats Limitations 3D hip excursion (cueing for mechanics with squats infront of chair)    Scapular Retraction Strengthening;Both;10 reps;Theraband    Theraband Level (Scapular Retraction) Level 3 (Green)    Row Strengthening;Both;10 reps;Theraband    Theraband Level (Row) Level 3 (Green)    Shoulder Extension Strengthening;Both;10 reps    Theraband Level (Shoulder Extension) Level 3 (Green)    Other Standing Lumbar Exercises back to wall raise LE in flexion B keeping core activated    Other Standing Lumbar Exercises tandem stance with head turns. single leg stance      Lumbar Exercises: Seated   Other Seated Lumbar Exercises thoracic excursion x 3      Manual Therapy   Manual Therapy Manual Traction;Soft tissue mobilization;Joint mobilization    Manual therapy comments done seperate from all other aspects of treatment    Soft tissue mobilization to decrese tension improve mobility                    PT Short Term Goals - 11/15/20 1608      PT SHORT TERM GOAL #1   Title PT to be I in HEP to allow decreased nerve irritation as shown by radicular sx going to knee level only    Time 3    Period  Weeks    Status On-going    Target Date 11/29/20      PT SHORT TERM GOAL #2   Title PT to be abe able to walk for 40 minutes without having to rest.    Time 3  Period Weeks    Status On-going      PT SHORT TERM GOAL #3   Title PT hip extensor strength to be increased by 1/2 grade to allow getting up from low couches/cars to be effortless.    Time 3    Period Weeks    Status On-going             PT Long Term Goals - 11/15/20 1609      PT LONG TERM GOAL #1   Title PT to be I in an advance HEP to allow pt radicular sx to be to mid thigh only    Time 6    Period Weeks    Status On-going      PT LONG TERM GOAL #2   Title PT to be hip extension strength to be at least a 4+/5 to allow pt to feel confident going up and down 12 steps without the use of UE    Time 6    Period Weeks    Status On-going      PT LONG TERM GOAL #3   Title PT to be able to single leg stance on both legs for at least 30" to reduce risk of falls    Time 6    Period Weeks    Status On-going                 Plan - 11/24/20 0859    Clinical Impression Statement Added tband posture and Pallof exercise to assist in engaging core.  PT continuees to pull shoulders behind Center of gravity. Continued with manual as pt continues to be tight and hypomobile in his lumbar area.    Personal Factors and Comorbidities Age;Comorbidity 2;Time since onset of injury/illness/exacerbation    Comorbidities CAD, CABG,CHF, HTN    Examination-Activity Limitations Carry;Lift;Locomotion Level;Stand;Stairs    Examination-Participation Restrictions Cleaning;Yard Work;Shop    Stability/Clinical Decision Making Evolving/Moderate complexity    Rehab Potential Good    PT Frequency 2x / week    PT Duration 6 weeks    PT Treatment/Interventions Patient/family education;Manual techniques;Therapeutic activities;Functional mobility training;Stair training;Gait training;Therapeutic exercise;Balance training;Neuromuscular  re-education    PT Next Visit Plan Continue to progress  gluteal and balance activities, manual PRN.    PT Home Exercise Plan single knee to chest. double, knee to chest and active hamstring stretch; 3/30: decompression 1-5; 11/17/20: Decompression exercise with RTB.    Consulted and Agree with Plan of Care Patient           Patient will benefit from skilled therapeutic intervention in order to improve the following deficits and impairments:  Decreased activity tolerance,Decreased balance,Decreased range of motion,Difficulty walking,Decreased strength,Increased fascial restricitons,Impaired flexibility,Pain  Visit Diagnosis: Difficulty in walking, not elsewhere classified  Muscle weakness (generalized)  Radiculopathy, lumbar region     Problem List Patient Active Problem List   Diagnosis Date Noted  . S/P left knee arthroscopy 07/15/19 07/31/2019  . Lateral meniscus derangement, left   . Derangement of posterior horn of medial meniscus of left knee   . OSA (obstructive sleep apnea) 09/02/2018  . Obesity (BMI 30-39.9) 09/02/2018  . Aftercare following surgery of the circulatory system, [place] 04/15/2014  . Occlusion and stenosis of carotid artery without mention of cerebral infarction 03/20/2012  . 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 S/P carotid endarterectomy 02/29/2012  . 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  . MI (myocardial infarction) ([place])   . Heart attack ([place])   . HTN (hypertension) 12/04/2011  .  Hyperlipemia 12/04/2011  . GERD (gastroesophageal reflux disease) 12/04/2011  . CAD (coronary artery disease) 12/04/2011  . Hypothyroidism, postsurgical 07/13/2011  . Thyroid nodule, uninodular 02/22/2011   Rayetta Humphrey, PT CLT [phone] 11/24/2020, 9:17 AM  [place] [address] [place], [place], [address] Phone: [phone]   Fax:  [phone]  Name: [name] [name] MRN: [id number] Date of Birth: 23-Feb-1941

## 2020-11-29 ENCOUNTER — Other Ambulatory Visit: Payer: Self-pay

## 2020-11-29 ENCOUNTER — Ambulatory Visit (HOSPITAL_COMMUNITY): Payer: Medicare HMO

## 2020-11-29 ENCOUNTER — Encounter (HOSPITAL_COMMUNITY): Payer: Self-pay

## 2020-11-29 DIAGNOSIS — M6281 Muscle weakness (generalized): Secondary | ICD-10-CM

## 2020-11-29 DIAGNOSIS — M5416 Radiculopathy, lumbar region: Secondary | ICD-10-CM | POA: Diagnosis not present

## 2020-11-29 DIAGNOSIS — R262 Difficulty in walking, not elsewhere classified: Secondary | ICD-10-CM | POA: Diagnosis not present

## 2020-11-29 NOTE — Therapy (Signed)
[place], [place], [address] Phone: [phone]   Fax:  [phone]  Physical Therapy Treatment  Patient Details  Name: [name] [name] MRN: [id number] Date of Birth: 1940/11/19 Referring Provider (PT): Arvin Collard , patel   Encounter Date: 11/29/2020   PT End of Session - 11/29/20 0814    Visit Number 7    Number of Visits 12    Date for PT Re-Evaluation 12/20/20    Authorization Type [employer]    Authorization Time Period Approved for 12 visits from 3/28-->12/20/20    Authorization - Visit Number 7    Authorization - Number of Visits 12    Progress Note Due on Visit 10    PT Start Time 0815    PT Stop Time 0900    PT Time Calculation (min) 45 min    Activity Tolerance Patient tolerated treatment well    Behavior During Therapy [facility] for tasks assessed/performed           Past Medical History:  Diagnosis Date  . Arthritis   . CAD (coronary artery disease)   . CAD (coronary artery disease) 12/04/2011   Left Main Disease with 3- vessel CAD   . CAD (coronary artery disease)    seeing Dr Harl Bowie  . CHF (congestive heart failure) ([place])   . Deviated septum   . Dyslipidemia   . GERD (gastroesophageal reflux disease)   . Heart attack ([place])   . Hyperlipidemia   . Hypertension   . Hypothyroidism   . MI (myocardial infarction) ([place]) 1997  . Neuropathy   . Obesity (BMI 30-39.9) 09/02/2018  . PONV (postoperative nausea and vomiting)    has in past, no problems after most recent surgery  . 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 S/P carotid endarterectomy 02/29/2012   Right CEA for asymptomatic severe right ICA stenosis   . 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 Shortness of breath    with exertion  . Sleep apnea    CPAP, sleep study at [facility]  . 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   80-99% stenosis RICA, asymptomatic  . Thyroid  nodule   . Wears glasses    or contacts    Past Surgical History:  Procedure Laterality Date  . ADENOIDECTOMY    . CARDIAC CATHETERIZATION     02/22/12  . CARDIOVASCULAR STRESS TEST  7/13  . CAROTID ENDARTERECTOMY    . COLONOSCOPY  15-20years and 12/04/11   Done at [place]  12/11/2011   Procedure: COLONOSCOPY;  Surgeon: Rogene Houston, MD;  [facility];  Service: Endoscopy;  Laterality: N/A;  830  . CORONARY ARTERY BYPASS GRAFT  02/29/2012   Procedure: CORONARY ARTERY BYPASS GRAFTING (CABG);  Surgeon: Rexene Alberts, MD;  Location: [place];  Service: Open Heart Surgery;  Laterality: N/A;  times three using Left Internal Mammary Artery and Right Greater Saphenous Vein Graft Harvested Endoscopically  . CORONARY STENT PLACEMENT    . ENDARTERECTOMY  02/29/2012   Procedure: ENDARTERECTOMY CAROTID;  Surgeon: Angelia Mould, MD;  Location: [place];  Service: Vascular;  Laterality: Right;  . FOOT SURGERY  06/30/11   artificial joint in big toe right foot   . KNEE ARTHROSCOPY WITH MEDIAL MENISECTOMY Left 07/15/2019   Procedure: KNEE ARTHROSCOPY WITH MEDIAL MENISCECTOMY AND LATERAL MENISCECTOMY;  Surgeon: Carole Civil, MD;  Location: [facility];  Service: Orthopedics;  Laterality: Left;  . LEFT HEART CATHETERIZATION WITH CORONARY ANGIOGRAM N/A 02/20/2012   Procedure: LEFT HEART CATHETERIZATION WITH CORONARY ANGIOGRAM;  Surgeon: Troy Sine, MD;  Location: [facility] CATH LAB;  Service: Cardiovascular;  Laterality: N/A;  . NASAL SEPTUM SURGERY    . Totol thyroidectomy  02/06/2011  . TRANSESOPHAGEAL ECHOCARDIOGRAM    . UPPER GASTROINTESTINAL ENDOSCOPY  35-40 years ago   [facility]    There were no vitals filed for this visit.   Subjective Assessment - 11/29/20 0820    Subjective Reports his back is feeling better but the radiating pain into LLE is the same and notes bilateral "pins and needles" in his feet at night    Pertinent History HTN, MI CAD, CAby  CHF, surgery on Rt foot replaced jt of big toe    Limitations Standing;House hold activities;Walking;Lifting    How long can you sit comfortably? no problem    How long can you stand comfortably? 30 minutes    How long can you walk comfortably? 20 minutes partially due to toe bothering him so much    Diagnostic tests MRI severe foraminal narrowing L3-L5; moderate L1 to L 3    Currently in Pain? Yes    Pain Score 2     Pain Location Back    Pain Orientation Lower    Pain Descriptors / Indicators Aching    Pain Type Chronic pain    Pain Radiating Towards BLE to feet/toes    Pain Onset More than a month ago    Aggravating Factors  WBing, standing, walking              [facility] PT Assessment - 11/29/20 0001      Assessment   Medical Diagnosis Radicular lumbar pain    Referring Provider (PT) Arvin Collard , patel    Next MD Visit June                         [facility] Adult PT Treatment/Exercise - 11/29/20 0001      Lumbar Exercises: Stretches   Active Hamstring Stretch Left;Right   1x10 reps 3 sec hold   Single Knee to Chest Stretch Right;Left;3 reps;30 seconds    Double Knee to Chest Stretch 30 seconds;3 reps    Piriformis Stretch Right;Left;2 reps;30 seconds      Lumbar Exercises: Aerobic   Recumbent Bike level 3 x 5 min for dynamic warm-up      Lumbar Exercises: Standing   Functional Squats Limitations 3D hip excursion (cueing for mechanics with squats infront of chair) 3x10    Scapular Retraction Strengthening;Both;Theraband;20 reps    Theraband Level (Scapular Retraction) Level 4 (Blue)    Row Strengthening;Both;20 reps    Theraband Level (Row) Level 4 (Blue)    Shoulder Extension Strengthening;20 reps    Theraband Level (Shoulder Extension) Level 4 (Blue)    Other Standing Lumbar Exercises paloff press 3x10 with blue t-band                  PT Education - 11/29/20 0829    Education Details reinforcement of HEP for mobility and core strength to improve  functional activity tolerance    Person(s) Educated Patient    Methods Explanation;Demonstration    Comprehension Verbalized understanding            PT Short Term Goals - 11/15/20 1608  PT SHORT TERM GOAL #1   Title PT to be I in HEP to allow decreased nerve irritation as shown by radicular sx going to knee level only    Time 3    Period Weeks    Status On-going    Target Date 11/29/20      PT SHORT TERM GOAL #2   Title PT to be abe able to walk for 40 minutes without having to rest.    Time 3    Period Weeks    Status On-going      PT SHORT TERM GOAL #3   Title PT hip extensor strength to be increased by 1/2 grade to allow getting up from low couches/cars to be effortless.    Time 3    Period Weeks    Status On-going             PT Long Term Goals - 11/15/20 1609      PT LONG TERM GOAL #1   Title PT to be I in an advance HEP to allow pt radicular sx to be to mid thigh only    Time 6    Period Weeks    Status On-going      PT LONG TERM GOAL #2   Title PT to be hip extension strength to be at least a 4+/5 to allow pt to feel confident going up and down 12 steps without the use of UE    Time 6    Period Weeks    Status On-going      PT LONG TERM GOAL #3   Title PT to be able to single leg stance on both legs for at least 30" to reduce risk of falls    Time 6    Period Weeks    Status On-going                 Plan - 11/29/20 1610    Clinical Impression Statement Pt tolerating tx sessions well and demonstrating good activity tolerance and improving form and sequence of ther ex and core strengthening exercises with increase in resistance and repetition without adverse effects. Pt reports minimal to no change in BLE symptoms but notes he does not have significant back pain. Continued POC indicated to trial traction and increase core strength    Personal Factors and Comorbidities Age;Comorbidity 2;Time since onset of injury/illness/exacerbation     Comorbidities CAD, CABG,CHF, HTN    Examination-Activity Limitations Carry;Lift;Locomotion Level;Stand;Stairs    Examination-Participation Restrictions Cleaning;Yard Work;Shop    Stability/Clinical Decision Making Evolving/Moderate complexity    Rehab Potential Good    PT Frequency 2x / week    PT Duration 6 weeks    PT Treatment/Interventions Patient/family education;Manual techniques;Therapeutic activities;Functional mobility training;Stair training;Gait training;Therapeutic exercise;Balance training;Neuromuscular re-education    PT Next Visit Plan Continue to progress  gluteal and balance activities, manual PRN.    PT Home Exercise Plan single knee to chest. double, knee to chest and active hamstring stretch; 3/30: decompression 1-5; 11/17/20: Decompression exercise with RTB.    Consulted and Agree with Plan of Care Patient           Patient will benefit from skilled therapeutic intervention in order to improve the following deficits and impairments:  Decreased activity tolerance,Decreased balance,Decreased range of motion,Difficulty walking,Decreased strength,Increased fascial restricitons,Impaired flexibility,Pain  Visit Diagnosis: Difficulty in walking, not elsewhere classified  Muscle weakness (generalized)  Radiculopathy, lumbar region     Problem List Patient Active Problem List   Diagnosis  Date Noted  . S/P left knee arthroscopy 07/15/19 07/31/2019  . Lateral meniscus derangement, left   . Derangement of posterior horn of medial meniscus of left knee   . OSA (obstructive sleep apnea) 09/02/2018  . Obesity (BMI 30-39.9) 09/02/2018  . Aftercare following surgery of the circulatory system, [place] 04/15/2014  . Occlusion and stenosis of carotid artery without mention of cerebral infarction 03/20/2012  . 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 S/P carotid endarterectomy 02/29/2012  . 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  . MI (myocardial infarction) ([place])   . Heart attack ([place])    . HTN (hypertension) 12/04/2011  . Hyperlipemia 12/04/2011  . GERD (gastroesophageal reflux disease) 12/04/2011  . CAD (coronary artery disease) 12/04/2011  . Hypothyroidism, postsurgical 07/13/2011  . Thyroid nodule, uninodular 02/22/2011    8:58 AM, 11/29/20 M. Sherlyn Lees, PT, DPT Physical Therapist- [place] Office Number: [phone]  [facility] [address] [place], [place], [address] Phone: [phone]   Fax:  [phone]  Name: [name] [name] MRN: [id number] Date of Birth: Jan 30, 1941

## 2020-12-01 ENCOUNTER — Other Ambulatory Visit: Payer: Self-pay

## 2020-12-01 ENCOUNTER — Encounter (HOSPITAL_COMMUNITY): Payer: Self-pay | Admitting: Physical Therapy

## 2020-12-01 ENCOUNTER — Ambulatory Visit (HOSPITAL_COMMUNITY): Payer: Medicare HMO | Admitting: Physical Therapy

## 2020-12-01 DIAGNOSIS — R262 Difficulty in walking, not elsewhere classified: Secondary | ICD-10-CM | POA: Diagnosis not present

## 2020-12-01 DIAGNOSIS — M6281 Muscle weakness (generalized): Secondary | ICD-10-CM

## 2020-12-01 DIAGNOSIS — M5416 Radiculopathy, lumbar region: Secondary | ICD-10-CM

## 2020-12-01 NOTE — Therapy (Signed)
[place], [place], [address] Phone: [phone]   Fax:  [phone]  Physical Therapy Treatment  Patient Details  Name: [name] [name] MRN: [id number] Date of Birth: Apr 19, 1941 Referring Provider (PT): Arvin Collard , patel   Encounter Date: 12/01/2020   PT End of Session - 12/01/20 0917    Visit Number 8    Number of Visits 12    Date for PT Re-Evaluation 12/20/20    Authorization Type [employer]    Authorization Time Period Approved for 12 visits from 3/28-->12/20/20    Authorization - Visit Number 8    Authorization - Number of Visits 12    Progress Note Due on Visit 10    PT Start Time 0834    PT Stop Time 0914    PT Time Calculation (min) 40 min    Activity Tolerance Patient tolerated treatment well    Behavior During Therapy [facility] for tasks assessed/performed           Past Medical History:  Diagnosis Date  . Arthritis   . CAD (coronary artery disease)   . CAD (coronary artery disease) 12/04/2011   Left Main Disease with 3- vessel CAD   . CAD (coronary artery disease)    seeing Dr Harl Bowie  . CHF (congestive heart failure) ([place])   . Deviated septum   . Dyslipidemia   . GERD (gastroesophageal reflux disease)   . Heart attack ([place])   . Hyperlipidemia   . Hypertension   . Hypothyroidism   . MI (myocardial infarction) ([place]) 1997  . Neuropathy   . Obesity (BMI 30-39.9) 09/02/2018  . PONV (postoperative nausea and vomiting)    has in past, no problems after most recent surgery  . 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 S/P carotid endarterectomy 02/29/2012   Right CEA for asymptomatic severe right ICA stenosis   . 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 Shortness of breath    with exertion  . Sleep apnea    CPAP, sleep study at [facility]  . 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   80-99% stenosis RICA, asymptomatic  . Thyroid  nodule   . Wears glasses    or contacts    Past Surgical History:  Procedure Laterality Date  . ADENOIDECTOMY    . CARDIAC CATHETERIZATION     02/22/12  . CARDIOVASCULAR STRESS TEST  7/13  . CAROTID ENDARTERECTOMY    . COLONOSCOPY  15-20years and 12/04/11   Done at [place]  12/11/2011   Procedure: COLONOSCOPY;  Surgeon: Rogene Houston, MD;  [facility];  Service: Endoscopy;  Laterality: N/A;  830  . CORONARY ARTERY BYPASS GRAFT  02/29/2012   Procedure: CORONARY ARTERY BYPASS GRAFTING (CABG);  Surgeon: Rexene Alberts, MD;  Location: [place];  Service: Open Heart Surgery;  Laterality: N/A;  times three using Left Internal Mammary Artery and Right Greater Saphenous Vein Graft Harvested Endoscopically  . CORONARY STENT PLACEMENT    . ENDARTERECTOMY  02/29/2012   Procedure: ENDARTERECTOMY CAROTID;  Surgeon: Angelia Mould, MD;  Location: [place];  Service: Vascular;  Laterality: Right;  . FOOT SURGERY  06/30/11   artificial joint in big toe right foot   . KNEE ARTHROSCOPY WITH MEDIAL MENISECTOMY Left 07/15/2019   Procedure: KNEE ARTHROSCOPY WITH MEDIAL MENISCECTOMY AND LATERAL MENISCECTOMY;  Surgeon: Carole Civil, MD;  [facility];  Service: Orthopedics;  Laterality: Left;  . LEFT HEART CATHETERIZATION WITH CORONARY ANGIOGRAM N/A 02/20/2012   Procedure: LEFT HEART CATHETERIZATION WITH CORONARY ANGIOGRAM;  Surgeon: Troy Sine, MD;  Location: [facility] C[facility];  Service: Cardiovascular;  Laterality: N/A;  . NASAL SEPTUM SURGERY    . Totol thyroidectomy  02/06/2011  . TRANSESOPHAGEAL ECHOCARDIOGRAM    . UPPER GASTROINTESTINAL ENDOSCOPY  35-40 years ago   [facility]    There were no vitals filed for this visit.   Subjective Assessment - 12/01/20 0829    Subjective Pt has no back pain but his legs tingle especially at night and this has not changed much.    Pertinent History HTN, MI CAD, CAby CHF, surgery on Rt foot replaced jt of big toe     Limitations Standing;House hold activities;Walking;Lifting    How long can you sit comfortably? no problem    How long can you stand comfortably? 30 minutes    How long can you walk comfortably? 20 minutes partially due to toe bothering him so much    Diagnostic tests MRI severe foraminal narrowing L3-L5; moderate L1 to L 3    Currently in Pain? No/denies    Pain Onset More than a month ago                             [facility] Adult PT Treatment/Exercise - 12/01/20 0001      Exercises   Exercises Ankle      Lumbar Exercises: Stretches   Single Knee to Chest Stretch Right;Left;3 reps;30 seconds    Double Knee to Chest Stretch 30 seconds;3 reps    Piriformis Stretch Right;Left;2 reps;30 seconds    Piriformis Stretch Limitations supine    Gastroc Stretch 60 seconds;2 reps    Gastroc Stretch Limitations slant board    Other Lumbar Stretch Exercise sitting forward bend x 3      Lumbar Exercises: Standing   Scapular Retraction Strengthening;Both;Theraband;15 reps    Theraband Level (Scapular Retraction) Level 3 (Green)    Row Strengthening;Both;20 reps    Theraband Level (Row) Level 4 (Blue);Level 3 (Green)    Shoulder Extension Strengthening;20 reps    Theraband Level (Shoulder Extension) Level 3 (Green)    Other Standing Lumbar Exercises paloff press 3x10 with blue t-band      Lumbar Exercises: Prone   Straight Leg Raise 10 reps    Straight Leg Raises Limitations hold 5 second      Manual Therapy   Manual Therapy Soft tissue mobilization    Manual therapy comments done seperate from all other aspects of treatment    Soft tissue mobilization to decrese tension improve mobility                    PT Short Term Goals - 11/15/20 1608      PT SHORT TERM GOAL #1   Title PT to be I in HEP to allow decreased nerve irritation as shown by radicular sx going to knee level only    Time 3    Period Weeks    Status On-going    Target Date 11/29/20      PT  SHORT TERM GOAL #2   Title PT to be abe able to walk for 40 minutes without having to rest.    Time 3    Period Weeks    Status On-going  PT SHORT TERM GOAL #3   Title PT hip extensor strength to be increased by 1/2 grade to allow getting up from low couches/cars to be effortless.    Time 3    Period Weeks    Status On-going             PT Long Term Goals - 11/15/20 1609      PT LONG TERM GOAL #1   Title PT to be I in an advance HEP to allow pt radicular sx to be to mid thigh only    Time 6    Period Weeks    Status On-going      PT LONG TERM GOAL #2   Title PT to be hip extension strength to be at least a 4+/5 to allow pt to feel confident going up and down 12 steps without the use of UE    Time 6    Period Weeks    Status On-going      PT LONG TERM GOAL #3   Title PT to be able to single leg stance on both legs for at least 30" to reduce risk of falls    Time 6    Period Weeks    Status On-going                 Plan - 12/01/20 7371    Clinical Impression Statement Pt with improved techniques with postrual tband exercises therefore gave pt green t band for door placement.  PT has no more back pain but continues to experience tingling in legs and feet at night.  PT is not experiencing this today.  Therapist recommended pt to complete 3 one minute sittting flexion when these sx occur to see if this improves sx.  Encouraged pt to make his own slant board to stretch gastroc mm at home    Personal Factors and Comorbidities Age;Comorbidity 2;Time since onset of injury/illness/exacerbation    Comorbidities CAD, CABG,CHF, HTN    Examination-Activity Limitations Carry;Lift;Locomotion Level;Stand;Stairs    Examination-Participation Restrictions Cleaning;Yard Work;Shop    Stability/Clinical Decision Making Evolving/Moderate complexity    Rehab Potential Good    PT Frequency 2x / week    PT Duration 6 weeks    PT Treatment/Interventions Patient/family education;Manual  techniques;Therapeutic activities;Functional mobility training;Stair training;Gait training;Therapeutic exercise;Balance training;Neuromuscular re-education    PT Next Visit Plan Ask pt how sitting forward bend changed or did not change sx of tingling when he experienced it.    PT Home Exercise Plan single knee to chest. double, knee to chest and active hamstring stretch; 3/30: decompression 1-5; 11/17/20: Decompression exercise with RTB.    Consulted and Agree with Plan of Care Patient           Patient will benefit from skilled therapeutic intervention in order to improve the following deficits and impairments:  Decreased activity tolerance,Decreased balance,Decreased range of motion,Difficulty walking,Decreased strength,Increased fascial restricitons,Impaired flexibility,Pain  Visit Diagnosis: Difficulty in walking, not elsewhere classified  Muscle weakness (generalized)  Radiculopathy, lumbar region     Problem List Patient Active Problem List   Diagnosis Date Noted  . S/P left knee arthroscopy 07/15/19 07/31/2019  . Lateral meniscus derangement, left   . Derangement of posterior horn of medial meniscus of left knee   . OSA (obstructive sleep apnea) 09/02/2018  . Obesity (BMI 30-39.9) 09/02/2018  . Aftercare following surgery of the circulatory system, [place] 04/15/2014  . Occlusion and stenosis of carotid artery without mention of cerebral infarction 03/20/2012  .  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 S/P carotid endarterectomy 02/29/2012  . 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  . MI (myocardial infarction) ([place])   . Heart attack ([place])   . HTN (hypertension) 12/04/2011  . Hyperlipemia 12/04/2011  . GERD (gastroesophageal reflux disease) 12/04/2011  . CAD (coronary artery disease) 12/04/2011  . Hypothyroidism, postsurgical 07/13/2011  . Thyroid nodule, uninodular 02/22/2011  Rayetta Humphrey, PT CLT [phone] 12/01/2020, 9:21 AM  [facility] [address] [place], [place], [address] Phone: [phone]   Fax:  [phone]  Name: [name] [name] MRN: [id number] Date of Birth: 1940/11/07

## 2020-12-06 ENCOUNTER — Encounter (HOSPITAL_COMMUNITY): Payer: Self-pay | Admitting: Physical Therapy

## 2020-12-06 ENCOUNTER — Other Ambulatory Visit: Payer: Self-pay

## 2020-12-06 ENCOUNTER — Ambulatory Visit (HOSPITAL_COMMUNITY): Payer: Medicare HMO | Admitting: Physical Therapy

## 2020-12-06 DIAGNOSIS — M5416 Radiculopathy, lumbar region: Secondary | ICD-10-CM | POA: Diagnosis not present

## 2020-12-06 DIAGNOSIS — M6281 Muscle weakness (generalized): Secondary | ICD-10-CM

## 2020-12-06 DIAGNOSIS — R262 Difficulty in walking, not elsewhere classified: Secondary | ICD-10-CM

## 2020-12-06 NOTE — Therapy (Signed)
[facility] [address] [place], [place], [address] Phone: [phone]   Fax:  [phone]  Physical Therapy Treatment  Patient Details  Name: [name] [name] MRN: [id number] Date of Birth: 01-Nov-1940 Referring Provider (PT): Arvin Collard , patel  Progress Note Reporting Period 3/28  to 4/25  See note below for Objective Data and Assessment of Progress/Goals.      Encounter Date: 12/06/2020   PT End of Session - 12/06/20 0902    Visit Number 9    Number of Visits 18    Date for PT Re-Evaluation 12/27/20    Authorization Type [employer]    Authorization Time Period Approved for 12 visits from 3/28-->12/20/20; requested additional visits past the 12 total of 18    Authorization - Visit Number 9    Authorization - Number of Visits 12    Progress Note Due on Visit 10    PT Start Time 0830    PT Stop Time 0920    PT Time Calculation (min) 50 min    Activity Tolerance Patient tolerated treatment well    Behavior During Therapy WFL for tasks assessed/performed           Past Medical History:  Diagnosis Date  . Arthritis   . CAD (coronary artery disease)   . CAD (coronary artery disease) 12/04/2011   Left Main Disease with 3- vessel CAD   . CAD (coronary artery disease)    seeing Dr Harl Bowie  . CHF (congestive heart failure) ([place])   . Deviated septum   . Dyslipidemia   . GERD (gastroesophageal reflux disease)   . Heart attack ([place])   . Hyperlipidemia   . Hypertension   . Hypothyroidism   . MI (myocardial infarction) ([place]) 1997  . Neuropathy   . Obesity (BMI 30-39.9) 09/02/2018  . PONV (postoperative nausea and vomiting)    has in past, no problems after most recent surgery  . 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 S/P carotid endarterectomy 02/29/2012   Right CEA for asymptomatic severe right ICA stenosis   . 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  Shortness of breath    with exertion  . Sleep apnea    CPAP, sleep [facility] at [facility]  . 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   80-99% stenosis RICA, asymptomatic  . Thyroid nodule   . Wears glasses    or contacts    Past Surgical History:  Procedure Laterality Date  . ADENOIDECTOMY    . CARDIAC CATHETERIZATION     02/22/12  . CARDIOVASCULAR STRESS TEST  7/13  . CAROTID ENDARTERECTOMY    . COLONOSCOPY  15-20years and 12/04/11   Done at [place]  12/11/2011   Procedure: COLONOSCOPY;  Surgeon: Rogene Houston, MD;  [facility];  Service: Endoscopy;  Laterality: N/A;  830  . CORONARY ARTERY BYPASS GRAFT  02/29/2012   Procedure: CORONARY ARTERY BYPASS GRAFTING (CABG);  Surgeon: Rexene Alberts, MD;  Location: [place];  Service: Open Heart Surgery;  Laterality: N/A;  times three using Left Internal Mammary Artery and Right Greater Saphenous Vein Graft Harvested Endoscopically  . CORONARY STENT PLACEMENT    . ENDARTERECTOMY  02/29/2012   Procedure: ENDARTERECTOMY CAROTID;  Surgeon: Angelia Mould, MD;  Location: [place];  Service: Vascular;  Laterality: Right;  . FOOT SURGERY  06/30/11   artificial joint in big toe right foot   . KNEE ARTHROSCOPY WITH MEDIAL MENISECTOMY Left 07/15/2019   Procedure: KNEE ARTHROSCOPY WITH MEDIAL MENISCECTOMY AND LATERAL MENISCECTOMY;  Surgeon: Carole Civil, MD;  [facility];  Service: Orthopedics;  Laterality: Left;  . LEFT HEART CATHETERIZATION WITH CORONARY ANGIOGRAM N/A 02/20/2012   Procedure: LEFT HEART CATHETERIZATION WITH CORONARY ANGIOGRAM;  Surgeon: Troy Sine, MD;  Location: [facility] CATH LAB;  Service: Cardiovascular;  Laterality: N/A;  . NASAL SEPTUM SURGERY    . Totol thyroidectomy  02/06/2011  . TRANSESOPHAGEAL ECHOCARDIOGRAM    . UPPER GASTROINTESTINAL ENDOSCOPY  35-40 years ago   [facility]    There were no vitals filed for this visit.   Subjective Assessment - 12/06/20 0828     Subjective Pt tried the forward bend when his legs were tingling and there was no change.    Pertinent History HTN, MI CAD, CAby CHF, surgery on Rt foot replaced jt of big toe    Limitations Standing;House hold activities;Walking;Lifting    How long can you sit comfortably? no problem    How long can you stand comfortably? 30 minutes    How long can you walk comfortably? 20 minutes partially due to toe bothering him so much    Diagnostic tests MRI severe foraminal narrowing L3-L5; moderate L1 to L 3    Pain Onset More than a month ago              [facility] PT Assessment - 12/06/20 0001      Assessment   Medical Diagnosis Radicular lumbar pain    Referring Provider (PT) Arvin Collard , patel    Onset Date/Surgical Date --   chronic   Next MD Visit 11/15/2020    Prior Therapy none      Precautions   Precautions None      Restrictions   Weight Bearing Restrictions No      Home Environment   Living Environment Private residence      Prior Function   Level of [place] Retired    Leisure wants to be able to walk more      Cognition   Overall Cognitive Status Within Functional Limits for tasks assessed      Observation/Other Assessments   Focus on Therapeutic Outcomes (FOTO)  69 31% 53, 47% affected      Functional Tests   Functional tests Single leg stance;Sit to Stand      Single Leg Stance   Comments LT:40"  5";   RT: 30" was  6"      Sit to Stand   Comments 12 in 30 seconds was, 9 in 30 seconds      Posture/Postural Control   Posture/Postural Control Postural limitations    Postural Limitations Decreased lumbar lordosis;Decreased thoracic kyphosis;Posterior pelvic tilt      AROM   Lumbar Flexion fingers 2" from the floor was 5" from floor:  reps no change    Lumbar Extension 20 degrees was 15      Strength   Right Hip Flexion 5/5    Right Hip Extension 4-/5   was 3/5   Left Hip Flexion 5/5    Left Hip Extension 4/5    Left Hip ABduction  5/5    Right Knee Flexion 5/5    Right Knee Extension 5/5    Left Knee Flexion 5/5    Left Knee Extension 5/5    Right Ankle  Plantar Flexion 4+/5   was 4+   Left Ankle Dorsiflexion 5/5   was 4/5     Flexibility   Soft Tissue Assessment /Muscle Length yes    Hamstrings Rt 155 was 143; Lt160 was  150                         [facility] Adult PT Treatment/Exercise - 12/06/20 0001      Lumbar Exercises: Stretches   Active Hamstring Stretch Left;Right;3 reps;30 seconds    Gastroc Stretch 60 seconds;2 reps    Gastroc Stretch Limitations slant board      Lumbar Exercises: Seated   Sit to Stand 10 reps      Lumbar Exercises: Prone   Straight Leg Raise 15 reps;5 seconds      Modalities   Modalities Traction      Traction   Type of Traction Lumbar    Min (lbs) 75    Max (lbs) 75    Hold Time static    Time 12                    PT Short Term Goals - 12/06/20 0848      PT SHORT TERM GOAL #1   Title PT to be I in HEP to allow decreased nerve irritation as shown by radicular sx going to knee level only    Time 3    Period Weeks    Status Not Met      PT SHORT TERM GOAL #2   Title PT to be abe able to walk for 40 minutes without having to rest.    Time 3    Period Weeks    Status Not Met      PT SHORT TERM GOAL #3   Title PT hip extensor strength to be increased by 1/2 grade to allow getting up from low couches/cars to be effortless.    Time 3    Period Weeks    Status Achieved             PT Long Term Goals - 12/06/20 0849      PT LONG TERM GOAL #1   Title PT to be I in an advance HEP to allow pt radicular sx to be to mid thigh only    Time 6    Period Weeks    Status On-going      PT LONG TERM GOAL #2   Title PT to be hip extension strength to be at least a 4+/5 to allow pt to feel confident going up and down 12 steps without the use of UE    Time 6    Period Weeks    Status Achieved      PT LONG TERM GOAL #3   Title PT to be able  to single leg stance on both legs for at least 30" to reduce risk of falls    Time 6    Period Weeks    Status Achieved                 Plan - 12/06/20 0904    Clinical Impression Statement Pt did not have any relief of the tingling over the weekend with flexion based exercises.  Pt reassessed and has improved in all aspects except the pain and tingling in his legs; He as met 1/3 STG and 2/3 LTG.  A trial of mechanical traction started today to see if this would  assist in decreasing pain in LE as he had good results with manual traction.  Requesting 6 more visits, an additional 3 weeks to see if traction will assist in decreasing this compaint.    Personal Factors and Comorbidities Age;Comorbidity 2;Time since onset of injury/illness/exacerbation    Comorbidities CAD, CABG,CHF, HTN    Examination-Activity Limitations Carry;Lift;Locomotion Level;Stand;Stairs    Examination-Participation Restrictions Cleaning;Yard Work;Shop    Stability/Clinical Decision Making Evolving/Moderate complexity    Rehab Potential Good    PT Frequency 2x / week    PT Duration 6 weeks   total of  9 weeks   PT Treatment/Interventions Patient/family education;Manual techniques;Therapeutic activities;Functional mobility training;Stair training;Gait training;Therapeutic exercise;Balance training;Neuromuscular re-education;Traction    PT Next Visit Plan Assess how traction did to relieve LE pain    PT Home Exercise Plan single knee to chest. double, knee to chest and active hamstring stretch; 3/30: decompression 1-5; 11/17/20: Decompression exercise with RTB.    Consulted and Agree with Plan of Care Patient           Patient will benefit from skilled therapeutic intervention in order to improve the following deficits and impairments:  Decreased activity tolerance,Decreased balance,Decreased range of motion,Difficulty walking,Decreased strength,Increased fascial restricitons,Impaired flexibility,Pain  Visit  Diagnosis: Difficulty in walking, not elsewhere classified  Muscle weakness (generalized)  Radiculopathy, lumbar region     Problem List Patient Active Problem List   Diagnosis Date Noted  . S/P left knee arthroscopy 07/15/19 07/31/2019  . Lateral meniscus derangement, left   . Derangement of posterior horn of medial meniscus of left knee   . OSA (obstructive sleep apnea) 09/02/2018  . Obesity (BMI 30-39.9) 09/02/2018  . Aftercare following surgery of the circulatory system, [place] 04/15/2014  . Occlusion and stenosis of carotid artery without mention of cerebral infarction 03/20/2012  . 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 S/P carotid endarterectomy 02/29/2012  . 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  . MI (myocardial infarction) ([place])   . Heart attack ([place])   . HTN (hypertension) 12/04/2011  . Hyperlipemia 12/04/2011  . GERD (gastroesophageal reflux disease) 12/04/2011  . CAD (coronary artery disease) 12/04/2011  . Hypothyroidism, postsurgical 07/13/2011  . Thyroid nodule, uninodular 02/22/2011   Rayetta Humphrey, PT CLT [phone] 12/06/2020, 9:26 AM  [facility] [address] [place], [place], [address] Phone: [phone]   Fax:  [phone]  Name: [name] [name] MRN: [id number] Date of Birth: 1941/06/17

## 2020-12-08 ENCOUNTER — Ambulatory Visit (HOSPITAL_COMMUNITY): Payer: Medicare HMO | Admitting: Physical Therapy

## 2020-12-08 ENCOUNTER — Other Ambulatory Visit: Payer: Self-pay

## 2020-12-08 ENCOUNTER — Encounter (HOSPITAL_COMMUNITY): Payer: Self-pay | Admitting: Physical Therapy

## 2020-12-08 DIAGNOSIS — R262 Difficulty in walking, not elsewhere classified: Secondary | ICD-10-CM

## 2020-12-08 DIAGNOSIS — M6281 Muscle weakness (generalized): Secondary | ICD-10-CM | POA: Diagnosis not present

## 2020-12-08 DIAGNOSIS — M5416 Radiculopathy, lumbar region: Secondary | ICD-10-CM

## 2020-12-08 NOTE — Therapy (Signed)
[place], [place], [address] Phone: [phone]   Fax:  [phone]  Physical Therapy Treatment  Patient Details  Name: [name] [name] MRN: [id number] Date of Birth: [birth year] Referring Provider (PT): Arvin Collard , patel   Encounter Date: 12/08/2020   PT End of Session - 12/08/20 0904    Visit Number 10    Number of Visits 18    Date for PT Re-Evaluation 12/27/20    Authorization Type [employer]    Authorization Time Period Approved for 12 visits from 3/28-->12/20/20; requested additional visits past the 12 total of 18    Authorization - Visit Number 10    Authorization - Number of Visits 12    Progress Note Due on Visit 18    PT Start Time 0830    PT Stop Time 0915    PT Time Calculation (min) 45 min    Activity Tolerance Patient tolerated treatment well    Behavior During Therapy [facility] for tasks assessed/performed           Past Medical History:  Diagnosis Date  . Arthritis   . CAD (coronary artery disease)   . CAD (coronary artery disease) 12/04/2011   Left Main Disease with 3- vessel CAD   . CAD (coronary artery disease)    seeing Dr Harl Bowie  . CHF (congestive heart failure) ([place])   . Deviated septum   . Dyslipidemia   . GERD (gastroesophageal reflux disease)   . Heart attack ([place])   . Hyperlipidemia   . Hypertension   . Hypothyroidism   . MI (myocardial infarction) ([place]) 1997  . Neuropathy   . Obesity (BMI 30-39.9) 09/02/2018  . PONV (postoperative nausea and vomiting)    has in past, no problems after most recent surgery  . 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 S/P carotid endarterectomy 02/29/2012   Right CEA for asymptomatic severe right ICA stenosis   . 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 Shortness of breath    with exertion  . Sleep apnea    CPAP, sleep [facility] at [facility]  . Stenosis of right carotid artery  02/23/2012   80-99% stenosis RICA, asymptomatic  . Thyroid nodule   . Wears glasses    or contacts    Past Surgical History:  Procedure Laterality Date  . ADENOIDECTOMY    . CARDIAC CATHETERIZATION     02/22/12  . CARDIOVASCULAR STRESS TEST  7/13  . CAROTID ENDARTERECTOMY    . COLONOSCOPY  15-20years and 12/04/11   Done at [place]  12/11/2011   Procedure: COLONOSCOPY;  Surgeon: Rogene Houston, MD;  [facility];  Service: Endoscopy;  Laterality: N/A;  830  . CORONARY ARTERY BYPASS GRAFT  02/29/2012   Procedure: CORONARY ARTERY BYPASS GRAFTING (CABG);  Surgeon: Rexene Alberts, MD;  Location: [place];  Service: Open Heart Surgery;  Laterality: N/A;  times three using Left Internal Mammary Artery and Right Greater Saphenous Vein Graft Harvested Endoscopically  . CORONARY STENT PLACEMENT    . ENDARTERECTOMY  02/29/2012   Procedure: ENDARTERECTOMY CAROTID;  Surgeon: Angelia Mould, MD;  Location: [place];  Service: Vascular;  Laterality: Right;  . FOOT SURGERY  06/30/11   artificial joint in big toe right foot   . KNEE ARTHROSCOPY WITH MEDIAL MENISECTOMY Left 07/15/2019  Procedure: KNEE ARTHROSCOPY WITH MEDIAL MENISCECTOMY AND LATERAL MENISCECTOMY;  Surgeon: Vickki Hearing, MD;  [facility];  Service: Orthopedics;  Laterality: Left;  . LEFT HEART CATHETERIZATION WITH CORONARY ANGIOGRAM N/A 02/20/2012   Procedure: LEFT HEART CATHETERIZATION WITH CORONARY ANGIOGRAM;  Surgeon: Lennette Bihari, MD;  Location: [facility] CATH LAB;  Service: Cardiovascular;  Laterality: N/A;  . NASAL SEPTUM SURGERY    . Totol thyroidectomy  02/06/2011  . TRANSESOPHAGEAL ECHOCARDIOGRAM    . UPPER GASTROINTESTINAL ENDOSCOPY  35-40 years ago   [facility]    There were no vitals filed for this visit.   Subjective Assessment - 12/08/20 0833    Subjective PT states that there was no change with the traction.  He is still having tingling in his legs    Pertinent History HTN,  MI CAD, CAby CHF, surgery on Rt foot replaced jt of big toe    Limitations Standing;House hold activities;Walking;Lifting    How long can you sit comfortably? no problem    How long can you stand comfortably? 30 minutes    How long can you walk comfortably? 20 minutes partially due to toe bothering him so much    Diagnostic tests MRI severe foraminal narrowing L3-L5; moderate L1 to L 3    Currently in Pain? Yes    Pain Score 3     Pain Location Leg    Pain Orientation Left;Right    Pain Descriptors / Indicators Tingling    Pain Onset More than a month ago    Pain Frequency Constant    Aggravating Factors  WB    Pain Relieving Factors sitting    Effect of Pain on Daily Activities limits                             [facility] Adult PT Treatment/Exercise - 12/08/20 0001      Exercises   Exercises Lumbar      Lumbar Exercises: Stretches   Prone on Elbows Stretch Limitations 2 minutes    Press Ups 10 reps;5 seconds    Gastroc Stretch 60 seconds;2 reps    Gastroc Stretch Limitations slant board      Lumbar Exercises: Standing   Functional Squats Limitations 3D hip excursion (cueing for mechanics with squats infront of chair) 3x10      Lumbar Exercises: Seated   Other Seated Lumbar Exercises thoracic excursion x 3      Lumbar Exercises: Prone   Straight Leg Raise 15 reps;5 seconds      Modalities   Modalities Traction      Traction   Type of Traction Lumbar    Min (lbs) 85    Time 12                    PT Short Term Goals - 12/06/20 0848      PT SHORT TERM GOAL #1   Title PT to be I in HEP to allow decreased nerve irritation as shown by radicular sx going to knee level only    Time 3    Period Weeks    Status Not Met      PT SHORT TERM GOAL #2   Title PT to be abe able to walk for 40 minutes without having to rest.    Time 3    Period Weeks    Status Not Met      PT SHORT TERM GOAL #3  Title PT hip extensor strength to be increased by  1/2 grade to allow getting up from low couches/cars to be effortless.    Time 3    Period Weeks    Status Achieved             PT Long Term Goals - 12/06/20 0849      PT LONG TERM GOAL #1   Title PT to be I in an advance HEP to allow pt radicular sx to be to mid thigh only    Time 6    Period Weeks    Status On-going      PT LONG TERM GOAL #2   Title PT to be hip extension strength to be at least a 4+/5 to allow pt to feel confident going up and down 12 steps without the use of UE    Time 6    Period Weeks    Status Achieved      PT LONG TERM GOAL #3   Title PT to be able to single leg stance on both legs for at least 30" to reduce risk of falls    Time 6    Period Weeks    Status Achieved                 Plan - 12/08/20 0906    Clinical Impression Statement Pt states no change of sx with traction.  PT with tingling in B LE today.  Completed extension based exercises which pt states decreased sx of tingling.  Increased traction to 85#    Personal Factors and Comorbidities Age;Comorbidity 2;Time since onset of injury/illness/exacerbation    Comorbidities CAD, CABG,CHF, HTN    Examination-Activity Limitations Carry;Lift;Locomotion Level;Stand;Stairs    Examination-Participation Restrictions Cleaning;Yard Work;Shop    Stability/Clinical Decision Making Evolving/Moderate complexity    Rehab Potential Good    PT Frequency 2x / week    PT Duration 6 weeks   total of  9 weeks   PT Treatment/Interventions Patient/family education;Manual techniques;Therapeutic activities;Functional mobility training;Stair training;Gait training;Therapeutic exercise;Balance training;Neuromuscular re-education;Traction    PT Next Visit Plan Add opposite arm/leg raise.    PT Home Exercise Plan single knee to chest. double, knee to chest and active hamstring stretch; 3/30: decompression 1-5; 11/17/20: Decompression exercise with RTB.    Consulted and Agree with Plan of Care Patient            Patient will benefit from skilled therapeutic intervention in order to improve the following deficits and impairments:  Decreased activity tolerance,Decreased balance,Decreased range of motion,Difficulty walking,Decreased strength,Increased fascial restricitons,Impaired flexibility,Pain  Visit Diagnosis: Difficulty in walking, not elsewhere classified  Muscle weakness (generalized)  Radiculopathy, lumbar region     Problem List Patient Active Problem List   Diagnosis Date Noted  . S/P left knee arthroscopy 07/15/19 07/31/2019  . Lateral meniscus derangement, left   . Derangement of posterior horn of medial meniscus of left knee   . OSA (obstructive sleep apnea) 09/02/2018  . Obesity (BMI 30-39.9) 09/02/2018  . Aftercare following surgery of the circulatory system, [place] 04/15/2014  . Occlusion and stenosis of carotid artery without mention of cerebral infarction 03/20/2012  . 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 S/P carotid endarterectomy 02/29/2012  . 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  . MI (myocardial infarction) ([place])   . Heart attack ([place])   . HTN (hypertension) 12/04/2011  . Hyperlipemia 12/04/2011  . GERD (gastroesophageal reflux disease) 12/04/2011  . CAD (coronary artery disease) 12/04/2011  . Hypothyroidism, postsurgical  07/13/2011  . Thyroid nodule, uninodular 02/22/2011  Rayetta Humphrey, PT CLT [phone] 12/08/2020, 9:19 AM  [place] [address] [place], [place], [address] Phone: [phone]   Fax:  [phone]  Name: [name] [name] MRN: [id number] Date of Birth: Apr 30, 1941

## 2020-12-13 ENCOUNTER — Ambulatory Visit (HOSPITAL_COMMUNITY): Payer: Medicare HMO | Attending: Neurology

## 2020-12-13 ENCOUNTER — Other Ambulatory Visit: Payer: Self-pay

## 2020-12-13 DIAGNOSIS — M6281 Muscle weakness (generalized): Secondary | ICD-10-CM | POA: Insufficient documentation

## 2020-12-13 DIAGNOSIS — R262 Difficulty in walking, not elsewhere classified: Secondary | ICD-10-CM | POA: Insufficient documentation

## 2020-12-13 DIAGNOSIS — M5416 Radiculopathy, lumbar region: Secondary | ICD-10-CM | POA: Diagnosis not present

## 2020-12-13 DIAGNOSIS — G4733 Obstructive sleep apnea (adult) (pediatric): Secondary | ICD-10-CM | POA: Diagnosis not present

## 2020-12-13 NOTE — Therapy (Signed)
[place], [place], [address] Phone: [phone]   Fax:  [phone]  Physical Therapy Treatment  Patient Details  Name: PAULA ZIETZ MRN: [id number] Date of Birth: May 25, 1941 Referring Provider (PT): Arvin Collard , patel   Encounter Date: 12/13/2020    Past Medical History:  Diagnosis Date  . Arthritis   . CAD (coronary artery disease)   . CAD (coronary artery disease) 12/04/2011   Left Main Disease with 3- vessel CAD   . CAD (coronary artery disease)    seeing Dr Harl Bowie  . CHF (congestive heart failure) ([place])   . Deviated septum   . Dyslipidemia   . GERD (gastroesophageal reflux disease)   . Heart attack ([place])   . Hyperlipidemia   . Hypertension   . Hypothyroidism   . MI (myocardial infarction) ([place]) 1997  . Neuropathy   . Obesity (BMI 30-39.9) 09/02/2018  . PONV (postoperative nausea and vomiting)    has in past, no problems after most recent surgery  . 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 S/P carotid endarterectomy 02/29/2012   Right CEA for asymptomatic severe right ICA stenosis   . 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 Shortness of breath    with exertion  . Sleep apnea    CPAP, [facility] at [facility]  . 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   80-99% stenosis RICA, asymptomatic  . Thyroid nodule   . Wears glasses    or contacts    Past Surgical History:  Procedure Laterality Date  . ADENOIDECTOMY    . CARDIAC CATHETERIZATION     02/22/12  . CARDIOVASCULAR STRESS TEST  7/13  . CAROTID ENDARTERECTOMY    . COLONOSCOPY  15-20years and 12/04/11   Done at [place]  12/11/2011   Procedure: COLONOSCOPY;  Surgeon: Rogene Houston, MD;  [facility];  Service: Endoscopy;  Laterality: N/A;  830  . CORONARY ARTERY BYPASS GRAFT  02/29/2012   Procedure: CORONARY ARTERY BYPASS GRAFTING  (CABG);  Surgeon: Rexene Alberts, MD;  Location: [place];  Service: Open Heart Surgery;  Laterality: N/A;  times three using Left Internal Mammary Artery and Right Greater Saphenous Vein Graft Harvested Endoscopically  . CORONARY STENT PLACEMENT    . ENDARTERECTOMY  02/29/2012   Procedure: ENDARTERECTOMY CAROTID;  Surgeon: Angelia Mould, MD;  Location: [place];  Service: Vascular;  Laterality: Right;  . FOOT SURGERY  06/30/11   artificial joint in big toe right foot   . KNEE ARTHROSCOPY WITH MEDIAL MENISECTOMY Left 07/15/2019   Procedure: KNEE ARTHROSCOPY WITH MEDIAL MENISCECTOMY AND LATERAL MENISCECTOMY;  Surgeon: Carole Civil, MD;  Location: [facility];  Service: Orthopedics;  Laterality: Left;  . LEFT HEART CATHETERIZATION WITH CORONARY ANGIOGRAM N/A 02/20/2012   Procedure: LEFT HEART CATHETERIZATION WITH CORONARY ANGIOGRAM;  Surgeon: Troy Sine, MD;  Location: [facility] CATH LAB;  Service: Cardiovascular;  Laterality: N/A;  . NASAL SEPTUM SURGERY    . Totol thyroidectomy  02/06/2011  . TRANSESOPHAGEAL ECHOCARDIOGRAM    . UPPER GASTROINTESTINAL ENDOSCOPY  35-40 years ago   [facility]    There were no vitals filed for this visit.   Subjective Assessment - 12/13/20 0820    Subjective No change in symptoms and worse pain in the BLE by the evening and notes burning and  tingling in BLE from the knees down    Pertinent History HTN, MI CAD, CAby CHF, surgery on Rt foot replaced jt of big toe    Limitations Standing;House hold activities;Walking;Lifting    How long can you sit comfortably? no problem    How long can you stand comfortably? 30 minutes    How long can you walk comfortably? 20 minutes partially due to toe bothering him so much    Diagnostic tests MRI severe foraminal narrowing L3-L5; moderate L1 to L 3    Currently in Pain? Yes    Pain Score 3     Pain Location Leg    Pain Orientation Right;Left    Pain Descriptors / Indicators Burning;Tingling    Pain Type Chronic pain     Pain Radiating Towards BLE feet/toes    Pain Onset More than a month ago    Pain Frequency Constant    Aggravating Factors  standing, walking,    Pain Relieving Factors sitting              [facility] PT Assessment - 12/13/20 0001      Assessment   Medical Diagnosis Radicular lumbar pain    Referring Provider (PT) Arvin Collard , patel    Next MD Visit June                         [facility] Adult PT Treatment/Exercise - 12/13/20 0001      Lumbar Exercises: Stretches   Single Knee to Chest Stretch Right;Left;3 reps;30 seconds    Double Knee to Chest Stretch 30 seconds;3 reps    Quadruped Mid Back Stretch 2 reps;60 seconds    Piriformis Stretch Right;Left;2 reps;30 seconds    Piriformis Stretch Limitations supine    Other Lumbar Stretch Exercise seated forward flexion stretch 3x30 sec      Lumbar Exercises: Aerobic   Recumbent Bike level 3 x 5 min for dynamic warm-up      Lumbar Exercises: Standing   Scapular Retraction Strengthening;Both   3x10   Theraband Level (Scapular Retraction) Level 4 (Blue)    Shoulder Extension Strengthening;Both   3x10 (stiff arm pull down for ab set)   Theraband Level (Shoulder Extension) Level 4 (Blue)    Other Standing Lumbar Exercises resisted retro/forward walking 4 plates 2x2 min    Other Standing Lumbar Exercises paloff press 2x10 with blue t-band                  PT Education - 12/13/20 0828    Education Details education on benefits of exercise bike/bicycling for general conditioning and as it pertains to lumbar stenosis. Education on flexion-bias    Person(s) Educated Patient    Methods Explanation;Demonstration    Comprehension Verbalized understanding            PT Short Term Goals - 12/06/20 0848      PT SHORT TERM GOAL #1   Title PT to be I in HEP to allow decreased nerve irritation as shown by radicular sx going to knee level only    Time 3    Period Weeks    Status Not Met      PT SHORT TERM GOAL #2   Title  PT to be abe able to walk for 40 minutes without having to rest.    Time 3    Period Weeks    Status Not Met      PT SHORT TERM GOAL #3  Title PT hip extensor strength to be increased by 1/2 grade to allow getting up from low couches/cars to be effortless.    Time 3    Period Weeks    Status Achieved             PT Long Term Goals - 12/06/20 0849      PT LONG TERM GOAL #1   Title PT to be I in an advance HEP to allow pt radicular sx to be to mid thigh only    Time 6    Period Weeks    Status On-going      PT LONG TERM GOAL #2   Title PT to be hip extension strength to be at least a 4+/5 to allow pt to feel confident going up and down 12 steps without the use of UE    Time 6    Period Weeks    Status Achieved      PT LONG TERM GOAL #3   Title PT to be able to single leg stance on both legs for at least 30" to reduce risk of falls    Time 6    Period Weeks    Status Achieved                 Plan - 12/13/20 0901    Clinical Impression Statement Tolerating tx sessions well and demo good HEP return, unfortunately no remission of LE symptoms and back pain despite position    Personal Factors and Comorbidities Age;Comorbidity 2;Time since onset of injury/illness/exacerbation    Comorbidities CAD, CABG,CHF, HTN    Examination-Activity Limitations Carry;Lift;Locomotion Level;Stand;Stairs    Examination-Participation Restrictions Cleaning;Yard Work;Shop    Stability/Clinical Decision Making Evolving/Moderate complexity    Rehab Potential Good    PT Frequency 2x / week    PT Duration 6 weeks   total of  9 weeks   PT Treatment/Interventions Patient/family education;Manual techniques;Therapeutic activities;Functional mobility training;Stair training;Gait training;Therapeutic exercise;Balance training;Neuromuscular re-education;Traction    PT Next Visit Plan Add opposite arm/leg raise.    PT Home Exercise Plan single knee to chest. double, knee to chest and active  hamstring stretch; 3/30: decompression 1-5; 11/17/20: Decompression exercise with RTB.    Consulted and Agree with Plan of Care Patient           Patient will benefit from skilled therapeutic intervention in order to improve the following deficits and impairments:  Decreased activity tolerance,Decreased balance,Decreased range of motion,Difficulty walking,Decreased strength,Increased fascial restricitons,Impaired flexibility,Pain  Visit Diagnosis: Difficulty in walking, not elsewhere classified  Muscle weakness (generalized)  Radiculopathy, lumbar region     Problem List Patient Active Problem List   Diagnosis Date Noted  . S/P left knee arthroscopy 07/15/19 07/31/2019  . Lateral meniscus derangement, left   . Derangement of posterior horn of medial meniscus of left knee   . OSA (obstructive sleep apnea) 09/02/2018  . Obesity (BMI 30-39.9) 09/02/2018  . Aftercare following surgery of the circulatory system, [place] 04/15/2014  . Occlusion and stenosis of carotid artery without mention of cerebral infarction 03/20/2012  . 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 S/P carotid endarterectomy 02/29/2012  . 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  . MI (myocardial infarction) ([place])   . Heart attack ([place])   . HTN (hypertension) 12/04/2011  . Hyperlipemia 12/04/2011  . GERD (gastroesophageal reflux disease) 12/04/2011  . CAD (coronary artery disease) 12/04/2011  . Hypothyroidism, postsurgical 07/13/2011  . Thyroid nodule, uninodular 02/22/2011   9:03 AM, 12/13/20 M. Claiborne Billings  Francine Graven, DPT Physical TherapistRoyston Sinner Office Number: [place] [address] [place], [place], [address] Phone: [phone]   Fax:  [phone]  Name: CONLEY DELISLE MRN: [id number] Date of Birth: 1941-03-02

## 2020-12-15 ENCOUNTER — Ambulatory Visit (HOSPITAL_COMMUNITY): Payer: Medicare HMO | Admitting: Physical Therapy

## 2020-12-15 ENCOUNTER — Other Ambulatory Visit: Payer: Self-pay

## 2020-12-15 ENCOUNTER — Encounter (HOSPITAL_COMMUNITY): Payer: Self-pay | Admitting: Physical Therapy

## 2020-12-15 DIAGNOSIS — M6281 Muscle weakness (generalized): Secondary | ICD-10-CM | POA: Diagnosis not present

## 2020-12-15 DIAGNOSIS — M5416 Radiculopathy, lumbar region: Secondary | ICD-10-CM

## 2020-12-15 DIAGNOSIS — R262 Difficulty in walking, not elsewhere classified: Secondary | ICD-10-CM | POA: Diagnosis not present

## 2020-12-15 NOTE — Therapy (Signed)
[facility] [address] [place], [place], [address] Phone: [phone]   Fax:  [phone]  Physical Therapy Treatment  Patient Details  Name: [name] [name] MRN: [id number] Date of Birth: 08/25/1940 Referring Provider (PT): [name] , patel PHYSICAL THERAPY DISCHARGE SUMMARY  Visits from Start of Care: 12 Current functional level related to goals / functional outcomes: See below    Remaining deficits: Tingling in Le   Education / Equipment: HEP Plan:                                                    Patient goals were not met. Patient is being discharged due to meeting the stated rehab goals.  ?????      Encounter Date: 12/15/2020   PT End of Session - 12/15/20 0833    Visit Number 12    Number of Visits 12    Date for PT Re-Evaluation 12/27/20    Authorization Type [employer]    Authorization Time Period Approved for 12 visits from 3/28-->12/20/20; requested additional visits past the 12 total of 18    Authorization - Visit Number 12    Authorization - Number of Visits 12    Progress Note Due on Visit 12    PT Start Time 0835    PT Stop Time 0915    PT Time Calculation (min) 40 min    Activity Tolerance Patient tolerated treatment well    Behavior During Therapy WFL for tasks assessed/performed           Past Medical History:  Diagnosis Date  . Arthritis   . CAD (coronary artery disease)   . CAD (coronary artery disease) 12/04/2011   Left Main Disease with 3- vessel CAD   . CAD (coronary artery disease)    seeing Dr Branch  . CHF (congestive heart failure) (HCC)   . Deviated septum   . Dyslipidemia   . GERD (gastroesophageal reflux disease)   . Heart attack (HCC)   . Hyperlipidemia   . Hypertension   . Hypothyroidism   . MI (myocardial infarction) (HCC) 1997  . Neuropathy   . Obesity (BMI 30-39.9) 09/02/2018  . PONV (postoperative nausea and vomiting)    has in past, no problems after most recent surgery  .  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 S/P carotid endarterectomy 02/29/2012   Right CEA for asymptomatic severe right ICA stenosis   . 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 Shortness of breath    with exertion  . Sleep apnea    CPAP, sleep study at [facility]  . 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   80-99% stenosis RICA, asymptomatic  . Thyroid nodule   . Wears glasses    or contacts    Past Surgical History:  Procedure Laterality Date  . ADENOIDECTOMY    . CARDIAC CATHETERIZATION     02/22/12  . CARDIOVASCULAR STRESS TEST  7/13  . CAROTID ENDARTERECTOMY    . COLONOSCOPY  15-20years and 12/04/11   Done at [facility]  . COLONOSCOPY  12/11/2011   Procedure: COLONOSCOPY;  Surgeon: Najeeb U Rehman, MD;  [facility];  Service: Endoscopy;  Laterality: N/A;  830  .   CORONARY ARTERY BYPASS GRAFT  02/29/2012   Procedure: CORONARY ARTERY BYPASS GRAFTING (CABG);  Surgeon: Clarence H Owen, MD;  Location: [facility] OR;  Service: Open Heart Surgery;  Laterality: N/A;  times three using Left Internal Mammary Artery and Right Greater Saphenous Vein Graft Harvested Endoscopically  . CORONARY STENT PLACEMENT    . ENDARTERECTOMY  02/29/2012   Procedure: ENDARTERECTOMY CAROTID;  Surgeon: Christopher S Dickson, MD;  Location: [facility] OR;  Service: Vascular;  Laterality: Right;  . FOOT SURGERY  06/30/11   artificial joint in big toe right foot   . KNEE ARTHROSCOPY WITH MEDIAL MENISECTOMY Left 07/15/2019   Procedure: KNEE ARTHROSCOPY WITH MEDIAL MENISCECTOMY AND LATERAL MENISCECTOMY;  Surgeon: Harrison, Stanley E, MD;  Location: [facility] ORS;  Service: Orthopedics;  Laterality: Left;  . LEFT HEART CATHETERIZATION WITH CORONARY ANGIOGRAM N/A 02/20/2012   Procedure: LEFT HEART CATHETERIZATION WITH CORONARY ANGIOGRAM;  Surgeon: Thomas A Kelly, MD;  Location: [facility] CATH LAB;  Service: Cardiovascular;  Laterality: N/A;  . NASAL SEPTUM  SURGERY    . Totol thyroidectomy  02/06/2011  . TRANSESOPHAGEAL ECHOCARDIOGRAM    . UPPER GASTROINTESTINAL ENDOSCOPY  35-40 years ago   [place]    There were no vitals filed for this visit.   Subjective Assessment - 12/15/20 0834    Subjective Pt states that he is still having tingling in his feet.    Pertinent History HTN, MI CAD, CAby CHF, surgery on Rt foot replaced jt of big toe    Limitations Standing;House hold activities;Walking;Lifting    How long can you sit comfortably? no problem    How long can you stand comfortably? 35- 40 minutes  was 30    How long can you walk comfortably? 30 minutes partially due to toe bothering him so much  was 20    Diagnostic tests MRI severe foraminal narrowing L3-L5; moderate L1 to L 3    Currently in Pain? Yes    Pain Score 5     Pain Location Foot    Pain Orientation Left;Right    Pain Descriptors / Indicators Tingling   in feet only, no pain in pt back or hips   Pain Onset More than a month ago    Aggravating Factors  standing, walking    Pain Relieving Factors sitting    Effect of Pain on Daily Activities limits              [facility] PT Assessment - 12/15/20 0001      Assessment   Medical Diagnosis Radicular lumbar pain    Referring Provider (PT) [name] , patel    Onset Date/Surgical Date --   chronic   Next MD Visit 11/15/2020    Prior Therapy none      Precautions   Precautions None      Restrictions   Weight Bearing Restrictions No      Home Environment   Living Environment Private residence      Prior Function   Level of Independence Independent    Vocation Retired    Leisure wants to be able to walk more      Cognition   Overall Cognitive Status Within Functional Limits for tasks assessed      Observation/Other Assessments   Focus on Therapeutic Outcomes (FOTO)  69 31% 53, 47% affected      Functional Tests   Functional tests Single leg stance;Sit to Stand      Single Leg Stance   Comments LT:44"  5";      RT: 35" was  6"      Sit to Stand   Comments 12 in 30 seconds was, 9 in 30 seconds      Posture/Postural Control   Posture/Postural Control Postural limitations    Postural Limitations Decreased lumbar lordosis;Decreased thoracic kyphosis;Posterior pelvic tilt      AROM   Lumbar Flexion fingers to  floor was 5" from floor:  reps no change    Lumbar Extension 25 degrees was 15      Strength   Right Hip Flexion 5/5    Right Hip Extension 4+/5   was 3/5   Left Hip Flexion 5/5    Left Hip Extension 4+/5    Left Hip ABduction 5/5    Right Knee Flexion 5/5    Right Knee Extension 5/5    Left Knee Flexion 5/5    Left Knee Extension 5/5    Right Ankle Plantar Flexion 4+/5   was 4+   Left Ankle Dorsiflexion 5/5   was 4/5     Flexibility   Soft Tissue Assessment /Muscle Length yes    Hamstrings Rt 155 was 143; Lt160 was  150                         [facility] Adult PT Treatment/Exercise - 12/15/20 0001      Lumbar Exercises: Stretches   Standing Extension 10 reps    Prone on Elbows Stretch Limitations 2 minutes      Lumbar Exercises: Seated   Sit to Stand 15 reps      Lumbar Exercises: Prone   Straight Leg Raise 10 reps      Manual Therapy   Manual Therapy Soft tissue mobilization    Manual therapy comments done seperate from all other aspects of treatment    Soft tissue mobilization to decrese tension improve mobility                    PT Short Term Goals - 12/15/20 0856      PT SHORT TERM GOAL #1   Title PT to be I in HEP to allow decreased nerve irritation as shown by radicular sx going to knee level only    Time 3    Period Weeks    Status Not Met      PT SHORT TERM GOAL #2   Title PT to be abe able to walk for 40 minutes without having to rest.    Time 3    Period Weeks    Status Achieved      PT SHORT TERM GOAL #3   Title PT hip extensor strength to be increased by 1/2 grade to allow getting up from low couches/cars to be effortless.     Time 3    Period Weeks    Status Achieved             PT Long Term Goals - 12/15/20 0856      PT LONG TERM GOAL #1   Title PT to be I in an advance HEP to allow pt radicular sx to be to mid thigh only    Time 6    Period Weeks    Status On-going      PT LONG TERM GOAL #2   Title PT to be hip extension strength to be at least a 4+/5 to allow pt to feel confident going up and down 12 steps without the use of UE  Time 6    Period Weeks    Status Achieved      PT LONG TERM GOAL #3   Title PT to be able to single leg stance on both legs for at least 30" to reduce risk of falls    Time 6    Period Weeks    Status Achieved                 Plan - 12/15/20 0916    Clinical Impression Statement Pt states that he still has tingling in his legs B but denies any back or hip pain.  Traction has not assisted in resolving issues.  PT is I in HEP and will be discharged today.  Pt has met 2/3 of both short and long term goal with the goal not being achieved having to do with radicular sx.  Pt has improved in ROM, strength and balance.    Personal Factors and Comorbidities Age;Comorbidity 2;Time since onset of injury/illness/exacerbation    Comorbidities CAD, CABG,CHF, HTN    Examination-Activity Limitations Carry;Lift;Locomotion Level;Stand;Stairs    Examination-Participation Restrictions Cleaning;Yard Work;Shop    Stability/Clinical Decision Making Evolving/Moderate complexity    Rehab Potential Good    PT Frequency 2x / week    PT Duration 6 weeks   total of  9 weeks   PT Treatment/Interventions Patient/family education;Manual techniques;Therapeutic activities;Functional mobility training;Stair training;Gait training;Therapeutic exercise;Balance training;Neuromuscular re-education;Traction    PT Next Visit Plan discharge.    PT Home Exercise Plan single knee to chest. double, knee to chest and active hamstring stretch; 3/30: decompression 1-5; 11/17/20: Decompression exercise  with RTB.; 5/4:  single leg stance, heel raises and tandem stance while turning head    Consulted and Agree with Plan of Care Patient           Patient will benefit from skilled therapeutic intervention in order to improve the following deficits and impairments:  Decreased activity tolerance,Decreased balance,Decreased range of motion,Difficulty walking,Decreased strength,Increased fascial restricitons,Impaired flexibility,Pain  Visit Diagnosis: Difficulty in walking, not elsewhere classified  Muscle weakness (generalized)  Radiculopathy, lumbar region     Problem List Patient Active Problem List   Diagnosis Date Noted  . S/P left knee arthroscopy 07/15/19 07/31/2019  . Lateral meniscus derangement, left   . Derangement of posterior horn of medial meniscus of left knee   . OSA (obstructive sleep apnea) 09/02/2018  . Obesity (BMI 30-39.9) 09/02/2018  . Aftercare following surgery of the circulatory system, [place] 04/15/2014  . Occlusion and stenosis of carotid artery without mention of cerebral infarction 03/20/2012  . 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 S/P carotid endarterectomy 02/29/2012  . 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  . MI (myocardial infarction) ([place])   . Heart attack ([place])   . HTN (hypertension) 12/04/2011  . Hyperlipemia 12/04/2011  . GERD (gastroesophageal reflux disease) 12/04/2011  . CAD (coronary artery disease) 12/04/2011  . Hypothyroidism, postsurgical 07/13/2011  . Thyroid nodule, uninodular 02/22/2011   Rayetta Humphrey, PT CLT [phone] 12/15/2020, 9:20 AM  [facility] [address] [place], [place], [address] Phone: [phone]   Fax:  [phone]  Name: ALLIN FRIX MRN: [id number] Date of Birth: 09/25/1940

## 2020-12-22 ENCOUNTER — Ambulatory Visit (INDEPENDENT_AMBULATORY_CARE_PROVIDER_SITE_OTHER): Payer: Medicare HMO | Admitting: Otolaryngology

## 2020-12-22 ENCOUNTER — Other Ambulatory Visit: Payer: Self-pay

## 2020-12-22 VITALS — Temp 97.5°F

## 2020-12-22 DIAGNOSIS — H7292 Unspecified perforation of tympanic membrane, left ear: Secondary | ICD-10-CM | POA: Diagnosis not present

## 2020-12-22 DIAGNOSIS — H6122 Impacted cerumen, left ear: Secondary | ICD-10-CM | POA: Diagnosis not present

## 2020-12-22 NOTE — Progress Notes (Signed)
HPI: [name] [name] is a 80 y.o. male who returns today for evaluation of blockage of his left ear.  He has had a previous history of a T-tube placed in the left ear over 15 years ago in [place].  This was removed from the left ear 4 months ago as he had had recurrent drainage from the left ear.  He has had no further drainage from the ear since last visit 3 months ago..  Past Medical History:  Diagnosis Date  . Arthritis   . CAD (coronary artery disease)   . CAD (coronary artery disease) 12/04/2011   Left Main Disease with 3- vessel CAD   . CAD (coronary artery disease)    seeing Dr Harl Bowie  . CHF (congestive heart failure) ([place])   . Deviated septum   . Dyslipidemia   . GERD (gastroesophageal reflux disease)   . Heart attack ([place])   . Hyperlipidemia   . Hypertension   . Hypothyroidism   . MI (myocardial infarction) ([place]) 1997  . Neuropathy   . Obesity (BMI 30-39.9) 09/02/2018  . PONV (postoperative nausea and vomiting)    has in past, no problems after most recent surgery  . 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 S/P carotid endarterectomy 02/29/2012   Right CEA for asymptomatic severe right ICA stenosis   . 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 Shortness of breath    with exertion  . Sleep apnea    CPAP, sleep [facility] at [facility]  . 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   80-99% stenosis RICA, asymptomatic  . Thyroid nodule   . Wears glasses    or contacts   Past Surgical History:  Procedure Laterality Date  . ADENOIDECTOMY    . CARDIAC CATHETERIZATION     02/22/12  . CARDIOVASCULAR STRESS TEST  7/13  . CAROTID ENDARTERECTOMY    . COLONOSCOPY  15-20years and 12/04/11   Done at [facility]  12/11/2011   Procedure: COLONOSCOPY;  Surgeon: Rogene Houston, MD;  [facility];  Service: Endoscopy;  Laterality: N/A;  830  . CORONARY ARTERY BYPASS GRAFT   02/29/2012   Procedure: CORONARY ARTERY BYPASS GRAFTING (CABG);  Surgeon: Rexene Alberts, MD;  Location: [facility];  Service: Open Heart Surgery;  Laterality: N/A;  times three using Left Internal Mammary Artery and Right Greater Saphenous Vein Graft Harvested Endoscopically  . CORONARY STENT PLACEMENT    . ENDARTERECTOMY  02/29/2012   Procedure: ENDARTERECTOMY CAROTID;  Surgeon: Angelia Mould, MD;  Location: [facility];  Service: Vascular;  Laterality: Right;  . FOOT SURGERY  06/30/11   artificial joint in big toe right foot   . KNEE ARTHROSCOPY WITH MEDIAL MENISECTOMY Left 07/15/2019   Procedure: KNEE ARTHROSCOPY WITH MEDIAL MENISCECTOMY AND LATERAL MENISCECTOMY;  Surgeon: Carole Civil, MD;  Location: [facility] ORS;  Service: Orthopedics;  Laterality: Left;  . LEFT HEART CATHETERIZATION WITH CORONARY ANGIOGRAM N/A 02/20/2012   Procedure: LEFT HEART CATHETERIZATION WITH CORONARY ANGIOGRAM;  Surgeon: Troy Sine, MD;  Location: [facility] CATH LAB;  Service: Cardiovascular;  Laterality: N/A;  . NASAL SEPTUM SURGERY    . Totol thyroidectomy  02/06/2011  . TRANSESOPHAGEAL ECHOCARDIOGRAM    . UPPER GASTROINTESTINAL ENDOSCOPY  35-40 years ago   [place] History   Socioeconomic History  . Marital status: Married  Spouse name: Velva Harman  . Number of children: 1  . Years of education: 31  . Highest education level: Not on file  Occupational History    Comment: retired  Tobacco Use  . Smoking status: Former Smoker    Packs/day: 0.50    Years: 15.00    Pack years: 7.50    Types: Cigarettes    Quit date: 09/04/2001    Years since quitting: 19.3  . Smokeless tobacco: Never Used  . Tobacco comment: smoked 26yrs, smoked 1/2 pack a day  Vaping Use  . Vaping Use: Never used  Substance and Sexual Activity  . Alcohol use: Not Currently    Alcohol/week: 3.0 - 4.0 standard drinks    Types: 3 - 4 Cans of beer per week    Comment: social- beer , not on regular basis  . Drug use: No  . Sexual  activity: Yes  Other Topics Concern  . Not on file  Social History Narrative   Consumes 2-3 cups of caffeine daily   Right handed   Lives in a one story home with a basement    Social Determinants of Health   Financial Resource Strain: Not on file  Food Insecurity: Not on file  Transportation Needs: Not on file  Physical Activity: Not on file  Stress: Not on file  Social Connections: Not on file   Family History  Problem Relation Age of Onset  . Hypertension Mother   . Healthy Sister   . Healthy Brother   . Healthy Son   . Heart attack Father   . Heart disease Father        Before age 49  . Hyperlipidemia Father   . Hypertension Father   . Prostate cancer Father   . Colon cancer Neg Hx    No Known Allergies Prior to Admission medications   Medication Sig Start Date End Date Taking? Authorizing Provider  aspirin EC 81 MG tablet Take 81 mg by mouth daily.    [provider]  atorvastatin (LIPITOR) 40 MG tablet TAKE 1 TABLET EVERY DAY (NEED APPOINTMENT FOR FURTHER REFILLS) 08/18/19   Arnoldo Lenis, MD  diclofenac (VOLTAREN) 75 MG EC tablet TAKE (1) TABLET BY MOUTH TWICE DAILY. 10/22/19   Carole Civil, MD  Esomeprazole Magnesium (NEXIUM PO) Take 20 mg by mouth daily.  08/19/14   [provider]  fluorouracil (EFUDEX) 5 % cream Apply topically at bedtime. 05/20/20   [place]ark-Burning, Anderson [place], PA-C  metoprolol (LOPRESSOR) 50 MG tablet Take 1 tablet (50 mg total) by mouth 2 (two) times daily. 09/23/13   Arnoldo Lenis, MD  Multiple Vitamins-Minerals (PRESERVISION AREDS 2 PO) Take 1 tablet by mouth 2 (two) times daily.     [provider]  Omega-3 Fatty Acids (FISH OIL) 1000 MG CPDR Take 1,000 mg by mouth 2 (two) times daily.     [provider]  promethazine (PHENERGAN) 12.5 MG tablet Take 1 tablet (12.5 mg total) by mouth every 6 (six) hours as needed for nausea or vomiting. 07/15/19   Carole Civil, MD  Psyllium (METAMUCIL PO) Take  3 capsules by mouth daily.     [provider]  ramipril (ALTACE) 10 MG capsule Take 10 mg by mouth daily. 03/03/13   [provider]  SYNTHROID 112 MCG tablet Take 112 mcg by mouth daily before breakfast.  08/15/18   [provider]  zolpidem (AMBIEN) 10 MG tablet Take 10 mg by mouth at bedtime as needed for  sleep.    [provider]     Positive ROS: Otherwise negative  All other systems have been reviewed and were otherwise negative with the exception of those mentioned in the HPI and as above.  Physical Exam: Constitutional: Alert, well-appearing, no acute distress Ears: External ears without lesions or tenderness.  Right ear canal and right TM are clear.  Left ear canal reveals a large amount of wax and some desquamated skin within the ear canal that was obstructing the ear canal.  This was removed with forceps and suction.  He has relatively small left ear canal.  After cleaning the wax from the ear canal he has a moderate size inferior TM perforation which is dry with no active drainage.  I applied CSF powder to the left ear. Nasal: External nose without lesions.. Clear nasal passages Oral: Lips and gums without lesions. Tongue and palate mucosa without lesions. Posterior oropharynx clear. Neck: No palpable adenopathy or masses Respiratory: Breathing comfortably  Skin: No facial/neck lesions or rash noted.  Cerumen impaction removal  Date/Time: 12/22/2020 12:53 PM Performed by: Rozetta Nunnery, MD Authorized by: Rozetta Nunnery, MD   Consent:    Consent obtained:  Verbal   Consent given by:  Patient   Risks discussed:  Pain and bleeding Procedure details:    Location:  L ear   Procedure type: suction and forceps   Post-procedure details:    Inspection:  TM intact and canal normal   Hearing quality:  Improved   Patient tolerance of procedure:  Tolerated well, no immediate complications Comments:     Right TM is clear.  Left TM  with moderate size inferior TM perforation with no signs of infection.    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  Plan: Recommend keeping the ear dry. When he has had drainage from his ear in the past TobraDex has done the best for treating this and he has TobraDex at home. He will follow-up as needed.   Radene Journey, MD

## 2020-12-24 DIAGNOSIS — I2510 Atherosclerotic heart disease of native coronary artery without angina pectoris: Secondary | ICD-10-CM | POA: Diagnosis not present

## 2020-12-24 DIAGNOSIS — I10 Essential (primary) hypertension: Secondary | ICD-10-CM | POA: Diagnosis not present

## 2021-01-03 DIAGNOSIS — J019 Acute sinusitis, unspecified: Secondary | ICD-10-CM | POA: Diagnosis not present

## 2021-01-12 ENCOUNTER — Ambulatory Visit: Payer: Medicare HMO | Admitting: Orthopedic Surgery

## 2021-01-13 ENCOUNTER — Telehealth: Payer: Self-pay | Admitting: Radiology

## 2021-01-13 ENCOUNTER — Encounter: Payer: Self-pay | Admitting: Orthopedic Surgery

## 2021-01-13 ENCOUNTER — Other Ambulatory Visit: Payer: Self-pay

## 2021-01-13 ENCOUNTER — Ambulatory Visit (INDEPENDENT_AMBULATORY_CARE_PROVIDER_SITE_OTHER): Payer: Medicare HMO | Admitting: Orthopedic Surgery

## 2021-01-13 VITALS — BP 140/82 | HR 66 | Ht 73.0 in | Wt 210.0 lb

## 2021-01-13 DIAGNOSIS — M25561 Pain in right knee: Secondary | ICD-10-CM

## 2021-01-13 DIAGNOSIS — M25562 Pain in left knee: Secondary | ICD-10-CM | POA: Diagnosis not present

## 2021-01-13 DIAGNOSIS — G8929 Other chronic pain: Secondary | ICD-10-CM

## 2021-01-13 DIAGNOSIS — Z9889 Other specified postprocedural states: Secondary | ICD-10-CM

## 2021-01-13 NOTE — Telephone Encounter (Signed)
[employer] and [facility] insurance I have down [employer] will cover the hyaluronic acid injections Would like to do Orthovisc if possible  I told him we would let him know possible out of pocket cost and do the injections when he comes back to clinic.   Can you check on it and let me know ? I told him I will call him.

## 2021-01-13 NOTE — Progress Notes (Signed)
Chief Complaint  Patient presents with  . Knee Pain    Pt states both knees are bothering him. L>R   Encounter Diagnoses  Name Primary?  . S/P left knee arthroscopy 07/15/19 Yes  . Chronic pain of left knee   . Chronic pain of right knee     80 year old male status post arthroscopy of the left knee in 2020 comes in complaining of bilateral knee pain inquiring about hyaluronic acid injections  Decided to go with cortisone injections today and inquire regarding the hyaluronic acid in terms of which preparation based on his insurance  Will call him when we get that information  We injected the right and left knee today.  Procedure note for bilateral knee injections  Procedure note left knee injection verbal consent was obtained to inject left knee joint  Timeout was completed to confirm the site of injection  The medications used were 40 mg Depo-Medrol with 1% lidocaine anesthesia was provided by ethyl chloride and the skin was prepped with alcohol.  After cleaning the skin with alcohol a 20-gauge needle was used to inject the left knee joint. There were no complications. A sterile bandage was applied.   Procedure note right knee injection verbal consent was obtained to inject right knee joint  Timeout was completed to confirm the site of injection  The medications used were Depo-Medrol 40 with 1% lidocaine anesthesia was provided by ethyl chloride and the skin was prepped with alcohol.  After cleaning the skin with alcohol a 20-gauge needle was used to inject the right knee joint. There were no complications. A sterile bandage was applied.

## 2021-01-13 NOTE — Patient Instructions (Signed)
Patient insurance will be contacted regarding hyaluronic acid injections

## 2021-01-18 NOTE — Telephone Encounter (Signed)
Submitted online for BV. Bil knees Orthovisc. [employer] primary, [employer] secondary.

## 2021-01-19 NOTE — Telephone Encounter (Signed)
Approved Tracking [id number]    Will look up auth # once updates.

## 2021-01-19 NOTE — Telephone Encounter (Signed)
I sent Abigail Butts the papers

## 2021-01-19 NOTE — Telephone Encounter (Signed)
Patient's insurance will pay all costs of injections minus a $20 copay at each date of service.  Buy and bill ok, PA required through [employer], I will submit online.

## 2021-01-20 NOTE — Progress Notes (Signed)
Follow-up Visit   Date: 01/20/21   [name] [name] MRN: [id number] DOB: [birth year]   Interim History: [name] [name] is a 80 y.o. right-handed Caucasian male with CAD s/p CABG, CHF, HTN, HPL, and s/p R CEA returning to the clinic for follow-up of bilateral leg numbness and right hand weakness.  The patient was accompanied to the clinic by self.  History of present illness: He underwent CABG in 2013 and following this. he began having numbness, tingling, in the feet which has slowly extended into his lower legs.  Symptoms are constant with no exacerbating or alleviating factors.  He denies weakness.  He has some imbalance, no falls, and walks unassisted.  He was previously evaluated at [facility] and had NCS/EMG of the legs which was normal.  He was being treated symptomatically with gabapentin, Lyrica, and Horizant, which did not help.  He opted not to take any medication and is here for second opinion.  He denies low back pain, cramps, or radiculopathy pain.  No history of diabetes or alcohol.   He also has tingling in the right hand, which has been long standing.  He notices this more when sleeping or resting on the arm when watching TV.  UPDATE 01/20/2021:  He is here for follow-up visit.  In March 2022, he had MRI lumbar spine which showed moderate spinal canal and severe bilateral neural foraminal narrowing at the L3-4, L4-5 levels.  He also has severe bilateral L1-2, L2-3 and L5-S1 neural foraminal narrowing.  NCS/EMG also showed multilevel lumbosacral radiculopathies, no evidence of neuropathy.    He completed PT and reports no improvement in his leg tingling or pain.  He denies weakness of the legs or fatigue with exertion.  He is interested in seeing a surgeon for his low back.  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   Medications:  Current Outpatient Medications on File Prior to Visit  Medication Sig Dispense  Refill   aspirin EC 81 MG tablet Take 81 mg by mouth daily.     atorvastatin (LIPITOR) 40 MG tablet TAKE 1 TABLET EVERY DAY (NEED APPOINTMENT FOR FURTHER REFILLS) 30 tablet 0   diclofenac (VOLTAREN) 75 MG EC tablet TAKE (1) TABLET BY MOUTH TWICE DAILY. 60 tablet 0   Esomeprazole Magnesium (NEXIUM PO) Take 20 mg by mouth daily.      fluorouracil (EFUDEX) 5 % cream Apply topically at bedtime. 40 g 1   metoprolol (LOPRESSOR) 50 MG tablet Take 1 tablet (50 mg total) by mouth 2 (two) times daily. 180 tablet 1   Multiple Vitamins-Minerals (PRESERVISION AREDS 2 PO) Take 1 tablet by mouth 2 (two) times daily.      Omega-3 Fatty Acids (FISH OIL) 1000 MG CPDR Take 1,000 mg by mouth 2 (two) times daily.      promethazine (PHENERGAN) 12.5 MG tablet Take 1 tablet (12.5 mg total) by mouth every 6 (six) hours as needed for nausea or vomiting. 30 tablet 0   Psyllium (METAMUCIL PO) Take 3 capsules by mouth daily.      ramipril (ALTACE) 10 MG capsule Take 10 mg by mouth daily.     SYNTHROID 112 MCG tablet Take 112 mcg by mouth daily before breakfast.      zolpidem (AMBIEN) 10 MG tablet Take 10 mg by mouth at bedtime as needed for sleep.     No current facility-administered medications on file prior to visit.    Allergies: No Known  Allergies   Neurological Exam: MENTAL STATUS including orientation to time, place, person, recent and remote memory, attention span and concentration, language, and fund of knowledge is normal.  Speech is not dysarthric.  MOTOR:  Motor strength is 5/5 in all extremities, except right finger abductors (4/5).  Severe right FDI and ADM atrophy, no fasciculations or abnormal movements.  No pronator drift.  Tone is normal.    MSRs:  Reflexes are 2+/4 throughout  SENSORY:  Intact to vibration throughout.  COORDINATION/GAIT:  Gait narrow based and stable.   Data: MRI lumbar spine 10/19/2020: Moderate spinal canal and severe bilateral neural foraminal narrowing at the L3-4, L4-5  levels. Moderate to severe bilateral L1-2, L2-3 and L5-S1 neural foraminal narrowing.  NCS/EMG of the arms 11/16/2020: Right ulnar neuropathy with slowing across the elbow, with demyelinating and axonal features.  Overall, these findings are very severe in degree electrically. Right median neuropathy at or distal to the wrist (moderate), consistent with a clinical diagnosis of carpal tunnel syndrome.   Chronic multilevel lumbar radiculopathies affecting the L2-L5 nerve root/segment on the left, moderate. There is no evidence of a sensorimotor polyneuropathy affecting the right upper or lower extremities.  IMPRESSION/PLAN: Bilateral leg paresthesias due to multilevel lumbar radiculopathies (L2-L5), as evidenced by EMG and imaging - Previously tried:  gabapentin, Lyrica, Horizant  - No benefit with PT  - Start Cymbalta 30mg  daily  - He is interested in seeking the opinion of neurosurgery to see if surgery would help alleviate his leg pain/paresthesias.    Right ulnar neuropathy at the elbow, severe  - He prefers conservative therapies, but not opposed to surgical decompression  Bilateral carpal tunnel syndrome, moderate - Continue wrist braces at nighttime  Patient will call the office to set up visit after his neurosurgery consultation.  Thank you for allowing me to participate in patient's care.  If I can answer any additional questions, I would be pleased to do so.    Sincerely,    Jacy Howat K. Posey Pronto, DO

## 2021-01-21 ENCOUNTER — Other Ambulatory Visit: Payer: Self-pay

## 2021-01-21 ENCOUNTER — Ambulatory Visit (INDEPENDENT_AMBULATORY_CARE_PROVIDER_SITE_OTHER): Payer: Medicare HMO | Admitting: Neurology

## 2021-01-21 ENCOUNTER — Encounter: Payer: Self-pay | Admitting: Neurology

## 2021-01-21 VITALS — BP 160/84 | HR 64 | Ht 73.0 in | Wt 208.0 lb

## 2021-01-21 DIAGNOSIS — M5417 Radiculopathy, lumbosacral region: Secondary | ICD-10-CM

## 2021-01-21 DIAGNOSIS — G5621 Lesion of ulnar nerve, right upper limb: Secondary | ICD-10-CM

## 2021-01-21 DIAGNOSIS — M47816 Spondylosis without myelopathy or radiculopathy, lumbar region: Secondary | ICD-10-CM | POA: Diagnosis not present

## 2021-01-21 DIAGNOSIS — G5603 Carpal tunnel syndrome, bilateral upper limbs: Secondary | ICD-10-CM | POA: Diagnosis not present

## 2021-01-21 MED ORDER — DULOXETINE HCL 30 MG PO CPEP: 30.0000 mg | ORAL_CAPSULE | Freq: Every day | ORAL | 3 refills | Status: AC

## 2021-01-21 NOTE — Addendum Note (Signed)
Addended by: Elta Guadeloupe on: 01/21/2021 08:56 AM   Modules accepted: Orders

## 2021-01-21 NOTE — Patient Instructions (Signed)
Start Cymbalta 30mg  daily  We will refer you to neurosurgery

## 2021-02-10 ENCOUNTER — Encounter: Payer: Self-pay | Admitting: Orthopedic Surgery

## 2021-02-10 ENCOUNTER — Ambulatory Visit: Payer: Medicare HMO | Admitting: Orthopedic Surgery

## 2021-02-10 ENCOUNTER — Other Ambulatory Visit: Payer: Self-pay

## 2021-02-10 VITALS — Ht 73.0 in | Wt 205.0 lb

## 2021-02-10 DIAGNOSIS — M170 Bilateral primary osteoarthritis of knee: Secondary | ICD-10-CM

## 2021-02-10 DIAGNOSIS — I10 Essential (primary) hypertension: Secondary | ICD-10-CM | POA: Diagnosis not present

## 2021-02-10 DIAGNOSIS — G8929 Other chronic pain: Secondary | ICD-10-CM

## 2021-02-10 DIAGNOSIS — I2510 Atherosclerotic heart disease of native coronary artery without angina pectoris: Secondary | ICD-10-CM | POA: Diagnosis not present

## 2021-02-10 DIAGNOSIS — M25562 Pain in left knee: Secondary | ICD-10-CM

## 2021-02-10 NOTE — Progress Notes (Signed)
Chief Complaint  Patient presents with   Injections    Bilateral knee injections #1 Orthovisc lot number [id number] exp 03 31 2024   Orthovisc 1 of 3  Encounter Diagnoses  Name Primary?   Chronic pain of left knee Yes   Chronic pain of right knee      Patient came in and agreed to injection right and left knee  Right knee Orthovisc injection #1  Standard alcohol and ethyl chloride prep anterolateral approach 1 vial injected  Left knee Orthovisc injection 1 of 3  Standard alcohol ethyl chloride prep anterolateral approach 1 vial injected

## 2021-02-17 ENCOUNTER — Ambulatory Visit (INDEPENDENT_AMBULATORY_CARE_PROVIDER_SITE_OTHER): Payer: Medicare HMO | Admitting: Orthopedic Surgery

## 2021-02-17 ENCOUNTER — Other Ambulatory Visit: Payer: Self-pay

## 2021-02-17 DIAGNOSIS — M25561 Pain in right knee: Secondary | ICD-10-CM

## 2021-02-17 DIAGNOSIS — M170 Bilateral primary osteoarthritis of knee: Secondary | ICD-10-CM | POA: Diagnosis not present

## 2021-02-17 DIAGNOSIS — G8929 Other chronic pain: Secondary | ICD-10-CM

## 2021-02-17 NOTE — Progress Notes (Signed)
FOLLOW UP   Encounter Diagnoses  Name Primary?   Chronic pain of right knee Yes   Chronic pain of left knee      Chief Complaint  Patient presents with   Injections    ORTHOVISC BOTH KNEES       Inject hyaluronic acid both KNEES  ORTHOVISC 2/3  Procedure note for injection of hyaluronic acid   Diagnosis osteoarthritis of the knee  Verbal consent was obtained to inject the knee with HYALURONIC ACID . Timeout was completed to confirm the injection site as the LEFT    Knee  Ethyl chloride spray was used for anesthesia Alcohol was used to prep the skin. The infrapatellar lateral portal was used as an injection site and 1 vial of hyaluronic acid  was injected into the knee  Specific Co. Preparation: ORTHOVISC  No complications were noted    Procedure note for injection of hyaluronic acid   Diagnosis osteoarthritis of the knee  Verbal consent was obtained to inject the knee with HYALURONIC ACID . Timeout was completed to confirm the injection site as the RIGHT    Knee  Ethyl chloride spray was used for anesthesia Alcohol was used to prep the skin. The infrapatellar lateral portal was used as an injection site and 1 vial of hyaluronic acid  was injected into the knee  Specific Co. Preparation: ORTHOVISC   No complications were noted

## 2021-02-22 ENCOUNTER — Other Ambulatory Visit: Payer: Self-pay

## 2021-02-22 ENCOUNTER — Ambulatory Visit (INDEPENDENT_AMBULATORY_CARE_PROVIDER_SITE_OTHER): Payer: Medicare HMO | Admitting: Otolaryngology

## 2021-02-22 DIAGNOSIS — J310 Chronic rhinitis: Secondary | ICD-10-CM

## 2021-02-22 DIAGNOSIS — H7292 Unspecified perforation of tympanic membrane, left ear: Secondary | ICD-10-CM | POA: Diagnosis not present

## 2021-02-22 DIAGNOSIS — I509 Heart failure, unspecified: Secondary | ICD-10-CM | POA: Diagnosis not present

## 2021-02-22 DIAGNOSIS — H6123 Impacted cerumen, bilateral: Secondary | ICD-10-CM

## 2021-02-22 NOTE — Progress Notes (Signed)
HPI: [name] [name] is a 80 y.o. male who returns today for evaluation of sinuses and ear.  Apparently had a bad sinus infection in May where his head was stopped up.  He was treated with several rounds of antibiotics as well as Mucinex but it took several weeks for him to gradually clear up.  He is doing much better today.  But he wanted to get his ears checked and cleaned..  Past Medical History:  Diagnosis Date   Arthritis    CAD (coronary artery disease)    CAD (coronary artery disease) 12/04/2011   Left Main Disease with 3- vessel CAD    CAD (coronary artery disease)    seeing Dr Harl Bowie   CHF (congestive heart failure) ([place])    Deviated septum    Dyslipidemia    GERD (gastroesophageal reflux disease)    Heart attack ([place])    Hyperlipidemia    Hypertension    Hypothyroidism    MI (myocardial infarction) ([place]) 1997   Neuropathy    Obesity (BMI 30-39.9) 09/02/2018   PONV (postoperative nausea and vomiting)    has in past, no problems after most recent surgery   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   LIMA to LAD, SVG to OM2, SVG to PDA, EVH via right thigh   S/P carotid endarterectomy 02/29/2012   Right CEA for asymptomatic severe right ICA stenosis    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   Left Ear Rim (Cx3,5FU)   Shortness of breath    with exertion   Sleep apnea    CPAP, sleep study at [facility]   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   80-99% stenosis RICA, asymptomatic   Thyroid nodule    Wears glasses    or contacts   Past Surgical History:  Procedure Laterality Date   ADENOIDECTOMY     CARDIAC CATHETERIZATION     02/22/12   CARDIOVASCULAR STRESS TEST  7/13   CAROTID ENDARTERECTOMY     COLONOSCOPY  15-20years and 12/04/11   Done at [place]  12/11/2011   Procedure: COLONOSCOPY;  Surgeon: Rogene Houston, MD;  Location: [facility];  Service: Endoscopy;  Laterality: N/A;  830   CORONARY ARTERY BYPASS GRAFT  02/29/2012   Procedure: CORONARY ARTERY  BYPASS GRAFTING (CABG);  Surgeon: Rexene Alberts, MD;  Location: [facility];  Service: Open Heart Surgery;  Laterality: N/A;  times three using Left Internal Mammary Artery and Right Greater Saphenous Vein Graft Harvested Endoscopically   CORONARY STENT PLACEMENT     ENDARTERECTOMY  02/29/2012   Procedure: ENDARTERECTOMY CAROTID;  Surgeon: Angelia Mould, MD;  Location: [facility];  Service: Vascular;  Laterality: Right;   FOOT SURGERY  06/30/11   artificial joint in big toe right foot    KNEE ARTHROSCOPY WITH MEDIAL MENISECTOMY Left 07/15/2019   Procedure: KNEE ARTHROSCOPY WITH MEDIAL MENISCECTOMY AND LATERAL MENISCECTOMY;  Surgeon: Carole Civil, MD;  Location: [facility];  Service: Orthopedics;  Laterality: Left;   LEFT HEART CATHETERIZATION WITH CORONARY ANGIOGRAM N/A 02/20/2012   Procedure: LEFT HEART CATHETERIZATION WITH CORONARY ANGIOGRAM;  Surgeon: Troy Sine, MD;  Location[facility] C[facility] LAB;  Service: Cardiovascular;  Laterality: N/A;   NASAL SEPTUM SURGERY     Totol thyroidectomy  02/06/2011   TRANSESOPHAGEAL ECHOCARDIOGRAM     UPPER GASTROINTESTINAL ENDOSCOPY  35-40 years ago   [place] History   Socioeconomic History   Marital status: Married    Spouse  name: Velva Harman   Number of children: 1   Years of education: 12   Highest education level: Not on file  Occupational History    Comment: retired  Tobacco Use   Smoking status: Former    Packs/day: 0.50    Years: 15.00    Pack years: 7.50    Types: Cigarettes    Quit date: 09/04/2001    Years since quitting: 19.4   Smokeless tobacco: Never   Tobacco comments:    smoked 40yrs, smoked 1/2 pack a day  Vaping Use   Vaping Use: Never used  Substance and Sexual Activity   Alcohol use: Not Currently    Alcohol/week: 3.0 - 4.0 standard drinks    Types: 3 - 4 Cans of beer per week    Comment: social- beer , not on regular basis   Drug use: No   Sexual activity: Yes  Other Topics Concern   Not on file  Social History  Narrative   Consumes 2-3 cups of caffeine daily   Right handed   Lives in a one story home with a basement    Social Determinants of Health   Financial Resource Strain: Not on file  Food Insecurity: Not on file  Transportation Needs: Not on file  Physical Activity: Not on file  Stress: Not on file  Social Connections: Not on file   Family History  Problem Relation Age of Onset   Hypertension Mother    Healthy Sister    Healthy Brother    Healthy Son    Heart attack Father    Heart disease Father        Before age 20   Hyperlipidemia Father    Hypertension Father    Prostate cancer Father    Colon cancer Neg Hx    No Known Allergies Prior to Admission medications   Medication Sig Start Date End Date Taking? Authorizing Provider  aspirin EC 81 MG tablet Take 81 mg by mouth daily.    [provider]  atorvastatin (LIPITOR) 40 MG tablet TAKE 1 TABLET EVERY DAY (NEED APPOINTMENT FOR FURTHER REFILLS) 08/18/19   Arnoldo Lenis, MD  diclofenac (VOLTAREN) 75 MG EC tablet TAKE (1) TABLET BY MOUTH TWICE DAILY. 10/22/19   Carole Civil, MD  DULoxetine (CYMBALTA) 30 MG capsule Take 1 capsule (30 mg total) by mouth daily. 01/21/21   Narda Amber K, DO  Esomeprazole Magnesium (NEXIUM PO) Take 20 mg by mouth daily.  08/19/14   [provider]  fluorouracil (EFUDEX) 5 % cream Apply topically at bedtime. 05/20/20   Clark-Burning, Anderson Malta, PA-C  metoprolol (LOPRESSOR) 50 MG tablet Take 1 tablet (50 mg total) by mouth 2 (two) times daily. 09/23/13   Arnoldo Lenis, MD  Multiple Vitamins-Minerals (PRESERVISION AREDS 2 PO) Take 1 tablet by mouth 2 (two) times daily.     [provider]  Omega-3 Fatty Acids (FISH OIL) 1000 MG CPDR Take 1,000 mg by mouth 2 (two) times daily.     [provider]  promethazine (PHENERGAN) 12.5 MG tablet Take 1 tablet (12.5 mg total) by mouth every 6 (six) hours as needed for nausea or vomiting. 07/15/19   Carole Civil,  MD  ramipril (ALTACE) 10 MG capsule Take 10 mg by mouth daily. 03/03/13   [provider]  SYNTHROID 112 MCG tablet Take 112 mcg by mouth daily before breakfast.  08/15/18   [provider]  zolpidem (AMBIEN) 10 MG tablet Take 10 mg by mouth  at bedtime as needed for sleep.    [provider]     Positive ROS: Otherwise negative  All other systems have been reviewed and were otherwise negative with the exception of those mentioned in the HPI and as above.  Physical Exam: Constitutional: Alert, well-appearing, no acute distress Ears: External ears without lesions or tenderness.  Right ear canal had minimal cerumen buildup that was removed with suction and curette.  The right TM was intact and clear.  The left ear canal had more cerumen obstructing the ear canal that was removed with suction and forceps.  Of note he has a moderate size left TM perforation but no active drainage noted. Nasal: External nose without lesions. Septum relatively midline.  Both middle meatus regions are easily visualized and clear with no evidence of mucopurulent discharge.  He has mild rhinitis..  Oral: Lips and gums without lesions. Tongue and palate mucosa without lesions. Posterior oropharynx clear. Neck: No palpable adenopathy or masses Respiratory: Breathing comfortably  Skin: No facial/neck lesions or rash noted.  Cerumen impaction removal  Date/Time: 02/22/2021 4:57 PM Performed by: Rozetta Nunnery, MD Authorized by: Rozetta Nunnery, MD   Consent:    Consent obtained:  Verbal   Consent given by:  Patient Procedure details:    Location:  L ear and R ear   Procedure type: curette, suction and forceps   Comments:     Ear canals were cleaned with suction curettes and forceps.  Right TM was clear.  Left TM reveals a moderate size left TM perforation but no signs of infection.  Assessment: Cerumen buildup worse on the left side with left tympanic membrane  perforation. Chronic rhinitis  Plan: Recommended regular use of his nasal steroid spray 2 sprays at night and use of saline irrigation as needed.  No clinical evidence of active infection requiring further antibiotics at this point concerning his sinuses. Recommended keeping water out of the left ear and to use antibiotic eardrops he develops any chronic drainage from the left ear. He will follow-up as needed.   Radene Journey, MD

## 2021-02-24 ENCOUNTER — Encounter: Payer: Self-pay | Admitting: Orthopedic Surgery

## 2021-02-24 ENCOUNTER — Ambulatory Visit (INDEPENDENT_AMBULATORY_CARE_PROVIDER_SITE_OTHER): Payer: Medicare HMO | Admitting: Orthopedic Surgery

## 2021-02-24 ENCOUNTER — Other Ambulatory Visit: Payer: Self-pay

## 2021-02-24 DIAGNOSIS — G8929 Other chronic pain: Secondary | ICD-10-CM

## 2021-02-24 DIAGNOSIS — M25562 Pain in left knee: Secondary | ICD-10-CM

## 2021-02-24 DIAGNOSIS — M170 Bilateral primary osteoarthritis of knee: Secondary | ICD-10-CM

## 2021-02-24 DIAGNOSIS — M1710 Unilateral primary osteoarthritis, unspecified knee: Secondary | ICD-10-CM

## 2021-02-24 DIAGNOSIS — Z9889 Other specified postprocedural states: Secondary | ICD-10-CM

## 2021-02-24 NOTE — Progress Notes (Signed)
FOLLOW UP   Encounter Diagnoses  Name Primary?   Chronic pain of left knee Yes   Chronic pain of right knee    S/P left knee arthroscopy 07/15/19    Primary localized osteoarthritis of knee right and left       Chief Complaint  Patient presents with   Injections    3rd orthovisc bilateral knees   Orthovisc njection #3 left knee  Orthovisc injection #3 right knee  Site confirmation was performed correct site identified right and left knee.  Skin prepped with alcohol and ethyl chloride injection given from superolateral approach left knee first and then right knee  Follow-up 8 weeks

## 2021-03-01 ENCOUNTER — Telehealth: Payer: Self-pay | Admitting: Neurology

## 2021-03-01 NOTE — Telephone Encounter (Signed)
Pt is wanting to check on the status of the referral to the spine center. He states that they have not gotten anything from our office

## 2021-03-02 NOTE — Telephone Encounter (Signed)
Will forward referral again and call patient after 8

## 2021-03-07 DIAGNOSIS — M5136 Other intervertebral disc degeneration, lumbar region: Secondary | ICD-10-CM | POA: Diagnosis not present

## 2021-03-07 DIAGNOSIS — G5601 Carpal tunnel syndrome, right upper limb: Secondary | ICD-10-CM | POA: Diagnosis not present

## 2021-03-07 DIAGNOSIS — M48062 Spinal stenosis, lumbar region with neurogenic claudication: Secondary | ICD-10-CM | POA: Diagnosis not present

## 2021-03-07 DIAGNOSIS — G5621 Lesion of ulnar nerve, right upper limb: Secondary | ICD-10-CM | POA: Diagnosis not present

## 2021-03-23 ENCOUNTER — Ambulatory Visit (INDEPENDENT_AMBULATORY_CARE_PROVIDER_SITE_OTHER): Payer: Medicare HMO | Admitting: Physician Assistant

## 2021-03-23 ENCOUNTER — Other Ambulatory Visit: Payer: Self-pay

## 2021-03-23 DIAGNOSIS — D485 Neoplasm of uncertain behavior of skin: Secondary | ICD-10-CM

## 2021-03-23 DIAGNOSIS — L820 Inflamed seborrheic keratosis: Secondary | ICD-10-CM | POA: Diagnosis not present

## 2021-03-23 DIAGNOSIS — L570 Actinic keratosis: Secondary | ICD-10-CM | POA: Diagnosis not present

## 2021-03-23 DIAGNOSIS — Z85828 Personal history of other malignant neoplasm of skin: Secondary | ICD-10-CM | POA: Diagnosis not present

## 2021-03-23 DIAGNOSIS — Z1283 Encounter for screening for malignant neoplasm of skin: Secondary | ICD-10-CM

## 2021-03-23 NOTE — Patient Instructions (Signed)

## 2021-03-29 ENCOUNTER — Encounter: Payer: Self-pay | Admitting: Physician Assistant

## 2021-03-29 NOTE — Progress Notes (Signed)
Follow-Up Visit   Subjective  [name] [name] is a 80 y.o. male who presents for the following: Annual Exam (Patient here today for skin check, per patient he has a lesion on his left lower leg x 1 year per patient it's growing, no pain, no bleeding. Patient also has a lesion on his left wrist x 1 year that's growing, no bleeding, no pain. Personal history of non mole skin cancer. No personal history of atypical moles, or melanoma.  No family history of atypical moles, melanoma or non mole skin cancer. ).   The following portions of the chart were reviewed this encounter and updated as appropriate:  Tobacco  Allergies  Meds  Problems  Med Hx  Surg Hx  Fam Hx      Objective  Well appearing patient in no apparent distress; mood and affect are within normal limits.  A full examination was performed including scalp, head, eyes, ears, nose, lips, neck, chest, axillae, abdomen, back, buttocks, bilateral upper extremities, bilateral lower extremities, hands, feet, fingers, toes, fingernails, and toenails. All findings within normal limits unless otherwise noted below.  Left Lower Leg - Anterior Hyperkeratotic scale with pink base        Left Wrist - Posterior Hyperkeratotic scale with pink base        Mid Occipital Scalp (2), Mid Parietal Scalp (3) Erythematous patches with gritty scale.  Assessment & Plan  Neoplasm of uncertain behavior of skin (2) Left Lower Leg - Anterior  Skin / nail biopsy Type of biopsy: tangential   Informed consent: discussed and consent obtained   Timeout: patient name, date of birth, surgical site, and procedure verified   Procedure prep:  Patient was prepped and draped in usual sterile fashion (Non sterile) Prep type:  Chlorhexidine Anesthesia: the lesion was anesthetized in a standard fashion   Anesthetic:  1% lidocaine w/ epinephrine 1-100,000 local infiltration Instrument used: flexible razor blade   Hemostasis achieved with: aluminum  chloride   Outcome: patient tolerated procedure well   Post-procedure details: sterile dressing applied and wound care instructions given   Dressing type: pressure dressing and petrolatum    Destruction of lesion Complexity: simple   Destruction method: electrodesiccation and curettage   Informed consent: discussed and consent obtained   Timeout:  patient name, date of birth, surgical site, and procedure verified Anesthesia: the lesion was anesthetized in a standard fashion   Anesthetic:  1% lidocaine w/ epinephrine 1-100,000 local infiltration Curettage performed in three different directions: Yes   Electrodesiccation performed over the curetted area: Yes   Curettage cycles:  1 Margin per side (cm):  0.1 Final wound size (cm):  1.1 Hemostasis achieved with:  aluminum chloride Outcome: patient tolerated procedure well with no complications   Post-procedure details: wound care instructions given    Specimen 1 - Surgical pathology Differential Diagnosis: R/O BCC vs SCC- txpbx  Check Margins: Yes  Left Wrist - Posterior  Skin / nail biopsy Type of biopsy: tangential   Informed consent: discussed and consent obtained   Timeout: patient name, date of birth, surgical site, and procedure verified   Anesthesia: the lesion was anesthetized in a standard fashion   Anesthetic:  1% lidocaine w/ epinephrine 1-100,000 local infiltration Instrument used: flexible razor blade   Hemostasis achieved with: aluminum chloride and electrodesiccation   Outcome: patient tolerated procedure well   Post-procedure details: wound care instructions given    Specimen 2 - Surgical pathology Differential Diagnosis: r/o sk  Check Margins: No  AK (actinic keratosis) (5) Mid Parietal Scalp (3); Mid Occipital Scalp (2)  Destruction of lesion - Mid Occipital Scalp, Mid Parietal Scalp Complexity: simple   Destruction method: cryotherapy   Informed consent: discussed and consent obtained   Timeout:   patient name, date of birth, surgical site, and procedure verified Lesion destroyed using liquid nitrogen: Yes   Cryotherapy cycles:  3 Outcome: patient tolerated procedure well with no complications    Related Medications fluorouracil (EFUDEX) 5 % cream Apply topically at bedtime.   I, Michall Noffke, PA-C, have reviewed all documentation's for this visit.  The documentation on 03/31/21 for the exam, diagnosis, procedures and orders are all accurate and complete.

## 2021-04-05 DIAGNOSIS — M48062 Spinal stenosis, lumbar region with neurogenic claudication: Secondary | ICD-10-CM | POA: Diagnosis not present

## 2021-04-11 DIAGNOSIS — R7303 Prediabetes: Secondary | ICD-10-CM | POA: Diagnosis not present

## 2021-04-11 DIAGNOSIS — I10 Essential (primary) hypertension: Secondary | ICD-10-CM | POA: Diagnosis not present

## 2021-04-12 DIAGNOSIS — E039 Hypothyroidism, unspecified: Secondary | ICD-10-CM | POA: Diagnosis not present

## 2021-04-12 DIAGNOSIS — N182 Chronic kidney disease, stage 2 (mild): Secondary | ICD-10-CM | POA: Diagnosis not present

## 2021-04-12 DIAGNOSIS — I129 Hypertensive chronic kidney disease with stage 1 through stage 4 chronic kidney disease, or unspecified chronic kidney disease: Secondary | ICD-10-CM | POA: Diagnosis not present

## 2021-04-12 DIAGNOSIS — G4700 Insomnia, unspecified: Secondary | ICD-10-CM | POA: Diagnosis not present

## 2021-04-12 DIAGNOSIS — E782 Mixed hyperlipidemia: Secondary | ICD-10-CM | POA: Diagnosis not present

## 2021-04-12 DIAGNOSIS — G4733 Obstructive sleep apnea (adult) (pediatric): Secondary | ICD-10-CM | POA: Diagnosis not present

## 2021-04-12 DIAGNOSIS — I2510 Atherosclerotic heart disease of native coronary artery without angina pectoris: Secondary | ICD-10-CM | POA: Diagnosis not present

## 2021-04-12 DIAGNOSIS — E118 Type 2 diabetes mellitus with unspecified complications: Secondary | ICD-10-CM | POA: Diagnosis not present

## 2021-04-12 DIAGNOSIS — Z0001 Encounter for general adult medical examination with abnormal findings: Secondary | ICD-10-CM | POA: Diagnosis not present

## 2021-04-21 ENCOUNTER — Other Ambulatory Visit: Payer: Self-pay

## 2021-04-21 ENCOUNTER — Ambulatory Visit (INDEPENDENT_AMBULATORY_CARE_PROVIDER_SITE_OTHER): Payer: Medicare HMO | Admitting: Orthopedic Surgery

## 2021-04-21 ENCOUNTER — Encounter: Payer: Self-pay | Admitting: Orthopedic Surgery

## 2021-04-21 VITALS — BP 141/91 | HR 61 | Ht 73.0 in | Wt 205.0 lb

## 2021-04-21 DIAGNOSIS — M48062 Spinal stenosis, lumbar region with neurogenic claudication: Secondary | ICD-10-CM

## 2021-04-21 DIAGNOSIS — G8929 Other chronic pain: Secondary | ICD-10-CM | POA: Diagnosis not present

## 2021-04-21 DIAGNOSIS — M25561 Pain in right knee: Secondary | ICD-10-CM | POA: Diagnosis not present

## 2021-04-21 DIAGNOSIS — M25562 Pain in left knee: Secondary | ICD-10-CM | POA: Diagnosis not present

## 2021-04-21 NOTE — Patient Instructions (Addendum)
If you decide to go for the therapy let us know and we will send order.

## 2021-04-21 NOTE — Progress Notes (Signed)
Chief Complaint  Patient presents with   Knee Pain    Injections bilateral knees did not help    [name] [name] comes in 80 year old male has spinal stenosis lumbar spine received 1 injection  His hyaluronic acid injections did not help however he is having symptoms of spinal stenosis with weakness when getting out of a chair and tiredness and giving way symptoms in his legs after walking which gets better when he sits down  We discussed this at length  I reviewed his MRI he definitely has spinal stenosis  He is being followed by Dr. Posey Pronto  After further discussion I think he would benefit from physical therapy and further injections before proceeding with spinal decompression and I would not do total knee replacements on him with those symptoms until he has more knee pain as the primary cause

## 2021-05-16 ENCOUNTER — Telehealth: Payer: Self-pay | Admitting: Orthopedic Surgery

## 2021-05-16 DIAGNOSIS — M48062 Spinal stenosis, lumbar region with neurogenic claudication: Secondary | ICD-10-CM

## 2021-05-16 NOTE — Telephone Encounter (Signed)
Left message to advise him orders sent.

## 2021-05-16 NOTE — Telephone Encounter (Signed)
He wants to proceed, at office visit I told him to let me know I have put in orders.

## 2021-05-16 NOTE — Telephone Encounter (Signed)
Patient called about a referral for PT on his back.  Please call him back at [phone]

## 2021-05-30 ENCOUNTER — Encounter: Payer: Self-pay | Admitting: Cardiology

## 2021-05-30 ENCOUNTER — Ambulatory Visit (INDEPENDENT_AMBULATORY_CARE_PROVIDER_SITE_OTHER): Payer: Medicare HMO | Admitting: Cardiology

## 2021-05-30 ENCOUNTER — Other Ambulatory Visit: Payer: Self-pay

## 2021-05-30 VITALS — BP 152/82 | HR 81 | Ht 74.0 in | Wt 211.0 lb

## 2021-05-30 DIAGNOSIS — I2510 Atherosclerotic heart disease of native coronary artery without angina pectoris: Secondary | ICD-10-CM | POA: Diagnosis not present

## 2021-05-30 DIAGNOSIS — G4733 Obstructive sleep apnea (adult) (pediatric): Secondary | ICD-10-CM

## 2021-05-30 DIAGNOSIS — I10 Essential (primary) hypertension: Secondary | ICD-10-CM

## 2021-05-30 DIAGNOSIS — I110 Hypertensive heart disease with heart failure: Secondary | ICD-10-CM | POA: Diagnosis not present

## 2021-05-30 DIAGNOSIS — E782 Mixed hyperlipidemia: Secondary | ICD-10-CM | POA: Diagnosis not present

## 2021-05-30 DIAGNOSIS — I509 Heart failure, unspecified: Secondary | ICD-10-CM | POA: Diagnosis not present

## 2021-05-30 MED ORDER — ATORVASTATIN CALCIUM 80 MG PO TABS: 80.0000 mg | ORAL_TABLET | Freq: Every day | ORAL | 3 refills | Status: DC

## 2021-05-30 MED ORDER — AMLODIPINE BESYLATE 5 MG PO TABS: 5.0000 mg | ORAL_TABLET | Freq: Every day | ORAL | 2 refills | Status: DC

## 2021-05-30 NOTE — Progress Notes (Signed)
Clinical Summary Mr. Raburn is a 80 y.o.male seen today for follow up of the following medical problems.    1. CAD   - prior 3 vessel CABG July 2013, LVEF around that time by LV gram was 55%   - has had chronic MSK chest pain since his CABG that is unchanged       - no chest pain, no SOB/DOE - compliant with meds   2. Carotid stenosis   - prior right CEA July 2013   - followed by vascular surgery    - no recent neuro symptoms.  - followed by vascular   3. HTN    - compliant with meds     4. Hyperlipidemia   - compliant with statin, labs followed by pcp  03/2021 TC 143 TG 89 HDL 45 LDL 81   5. OSA   - compliant with cpap, followed by Dr Radford Pax        Past Medical History:  Diagnosis Date   Arthritis    CAD (coronary artery disease)    CAD (coronary artery disease) 12/04/2011   Left Main Disease with 3- vessel CAD    CAD (coronary artery disease)    seeing Dr Harl Bowie   CHF (congestive heart failure) ([place])    Deviated septum    Dyslipidemia    GERD (gastroesophageal reflux disease)    Heart attack ([place])    Hyperlipidemia    Hypertension    Hypothyroidism    MI (myocardial infarction) ([place]) 1997   Neuropathy    Obesity (BMI 30-39.9) 09/02/2018   PONV (postoperative nausea and vomiting)    has in past, no problems after most recent surgery   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   LIMA to LAD, SVG to OM2, SVG to PDA, EVH via right thigh   S/P carotid endarterectomy 02/29/2012   Right CEA for asymptomatic severe right ICA stenosis    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      atorvastatin (LIPITOR) 40 MG tablet TAKE 1 TABLET EVERY DAY (NEED APPOINTMENT FOR FURTHER REFILLS) 30 tablet 0   diclofenac (VOLTAREN) 75 MG EC tablet TAKE (1) TABLET BY MOUTH TWICE DAILY. 60 tablet 0   DULoxetine (CYMBALTA) 30 MG capsule Take 1 capsule (30 mg total) by mouth daily. 30 capsule 3   Esomeprazole Magnesium (NEXIUM PO) Take 20 mg by mouth daily.      fluorouracil (EFUDEX) 5 % cream Apply topically at bedtime. 40 g 1   metoprolol (LOPRESSOR) 50 MG tablet Take 1 tablet (50 mg total) by mouth 2 (two) times daily. 180 tablet 1   Multiple Vitamins-Minerals (PRESERVISION AREDS 2 PO) Take 1 tablet by mouth 2 (two) times daily.      Omega-3 Fatty Acids (FISH OIL) 1000 MG CPDR Take 1,000 mg by mouth 2 (two) times daily.      promethazine (PHENERGAN) 12.5 MG tablet Take 1 tablet (12.5 mg total) by mouth every 6 (six) hours as needed for nausea or vomiting.  30 tablet 0   ramipril (ALTACE) 10 MG capsule Take 10 mg by mouth daily.     SYNTHROID 112 MCG tablet Take 112 mcg by mouth daily before breakfast.      zolpidem (AMBIEN) 10 MG tablet Take 10 mg by mouth at bedtime as needed for sleep.     No current facility-administered medications for this visit.     Past Surgical History:  Procedure Laterality Date   ADENOIDECTOMY     CARDIAC CATHETERIZATION     02/22/12   CARDIOVASCULAR STRESS TEST  7/13   CAROTID ENDARTERECTOMY     COLONOSCOPY  15-20years and 12/04/11   Done at [facility]  12/11/2011   Procedure: COLONOSCOPY;  Surgeon: Rogene Houston, MD;  [facility];  Service: Endoscopy;  Laterality: N/A;  830   CORONARY ARTERY BYPASS GRAFT  02/29/2012   Procedure: CORONARY ARTERY BYPASS GRAFTING (CABG);  Surgeon: Rexene Alberts, MD;  Location: [facility];  Service: Open Heart Surgery;  Laterality: N/A;  times three using Left Internal Mammary Artery and Right Greater Saphenous Vein Graft Harvested Endoscopically   CORONARY STENT PLACEMENT     ENDARTERECTOMY   02/29/2012   Procedure: ENDARTERECTOMY CAROTID;  Surgeon: Angelia Mould, MD;  Location: [facility];  Service: Vascular;  Laterality: Right;   FOOT SURGERY  06/30/11   artificial joint in big toe right foot    KNEE ARTHROSCOPY WITH MEDIAL MENISECTOMY Left 07/15/2019   Procedure: KNEE ARTHROSCOPY WITH MEDIAL MENISCECTOMY AND LATERAL MENISCECTOMY;  Surgeon: Carole Civil, MD;  [facility];  Service: Orthopedics;  Laterality: Left;   LEFT HEART CATHETERIZATION WITH CORONARY ANGIOGRAM N/A 02/20/2012   Procedure: LEFT HEART CATHETERIZATION WITH CORONARY ANGIOGRAM;  Surgeon: [place] Sine, MD;  Location: [facility] C[facility] LAB;  Service: Cardiovascular;  Laterality: N/A;   NASAL SEPTUM SURGERY     Totol thyroidectomy  02/06/2011   TRANSESOPHAGEAL ECHOCARDIOGRAM     UPPER GASTROINTESTINAL ENDOSCOPY  35-40 years ago   [facility]     No Known Allergies    Family History  Problem Relation Age of Onset   Hypertension Mother    Healthy Sister    Healthy Brother    Healthy Son    Heart attack Father    Heart disease Father        Before age 58   Hyperlipidemia Father    Hypertension Father    Prostate cancer Father    Colon cancer Neg Hx      Social History Mr. Dillenbeck reports that he quit smoking about 19 years ago. His smoking use included cigarettes. He has a 7.50 pack-year smoking history. He has never used smokeless tobacco. Mr. Guyton reports that he does not currently use alcohol after a past usage of about 3.0 - 4.0 standard drinks per week.   Review of Systems CONSTITUTIONAL: No weight loss, fever, chills, weakness or fatigue.  HEENT: Eyes: No visual loss, blurred vision, double vision or yellow sclerae.No hearing loss, sneezing, congestion, runny nose or sore throat.  SKIN: No rash or itching.  CARDIOVASCULAR: per hpi RESPIRATORY: No shortness of breath, cough or sputum.  GASTROINTESTINAL: No anorexia, nausea, vomiting or diarrhea. No abdominal pain or blood.   GENITOURINARY: No burning on urination, no polyuria NEUROLOGICAL: No headache, dizziness, syncope, paralysis, ataxia, numbness or tingling in the extremities. No change in bowel or bladder control.  MUSCULOSKELETAL: No muscle, back pain, joint pain or stiffness.  LYMPHATICS: No enlarged nodes. No history  of splenectomy.  PSYCHIATRIC: No history of depression or anxiety.  ENDOCRINOLOGIC: No reports of sweating, cold or heat intolerance. No polyuria or polydipsia.  Marland Kitchen   Physical Examination Today's Vitals   05/30/21 1348  BP: (!) 152/82  Pulse: 81  SpO2: 95%  Weight: 211 lb (95.7 kg)  Height: 6\' 2"  (1.88 m)   Body mass index is 27.09 kg/m.  Gen: resting comfortably, no acute distress HEENT: no scleral icterus, pupils equal round and reactive, no palptable cervical adenopathy,  CV: RRR, no m/r/ gno jvd Resp: Clear to auscultation bilaterally GI: abdomen is soft, non-tender, non-distended, normal bowel sounds, no hepatosplenomegaly MSK: extremities are warm, no edema.  Skin: warm, no rash Neuro:  no focal deficits Psych: appropriate affect   Diagnostic Studies 02/2012 Cath   1. Normal left ventricular function with an ejection fraction of at   least 55% with suggestion of minimal posterobasal inferior   hypocontractility.   2. Extensive coronary calcification involving the left main, left   anterior descending artery, left circumflex, and right coronary   arteries.   3. Probable ostial tapering of the left main to 60-70% with evidence   for AO pressure dampening by 10-15 mm concordant with ostial   stenosis.   4. 40% proximal circumflex with 70% circumflex marginal 1 stenosis.   5. Total occlusion of the right coronary artery at the ostium with   extensive collateralization to the posterior descending artery   vessel via the left coronary circulation.     03/2013 Echo   LVEF 55-60%, no WMAs, grade I diastolic dysfunction, mild MR     02/2013 Carotid US   Patent right  CEA, <40% stenosis on left.     04/2015 Carotid US Patent RICA/CEA, LICA 94-80%    Assessment and Plan  1. CAD   - no symptoms, continue current meds   2. Carotid stenosis   - follow up with vascular     3. HTN   - above goal. Mildly elevated K on last labs, would not increase rampril. Start norvasc 5mg  daily.    4. Hyperlipidemia   - above LDL goal of <70, will incraese atorvastatin to 80mg  daily.    5. OSA - followed with Dr Radford Pax, would like to establish locally, refer to [facility]          Arnoldo Lenis, M.D.

## 2021-05-30 NOTE — Patient Instructions (Signed)
Medication Instructions:  Your physician has recommended you make the following change in your medication:  Start amlodipine 5 mg daily Increase atorvastatin to 80 mg daily Continue other medications the same  Labwork: none  Testing/Procedures: none  Follow-Up: Your physician recommends that you schedule a follow-up appointment in: 1 year. You will receive a reminder letter or call in the mail in about 10 months reminding you to call and schedule your appointment. If you don't receive this letter, please contact our office.;   Any Other Special Instructions Will Be Listed Below (If Applicable). You have been referred to [facility]  If you need a refill on your cardiac medications before your next appointment, please call your pharmacy.

## 2021-05-31 ENCOUNTER — Ambulatory Visit (HOSPITAL_COMMUNITY): Payer: Medicare HMO | Attending: Orthopedic Surgery

## 2021-05-31 DIAGNOSIS — R29898 Other symptoms and signs involving the musculoskeletal system: Secondary | ICD-10-CM | POA: Insufficient documentation

## 2021-05-31 DIAGNOSIS — M6281 Muscle weakness (generalized): Secondary | ICD-10-CM | POA: Diagnosis not present

## 2021-05-31 DIAGNOSIS — R2681 Unsteadiness on feet: Secondary | ICD-10-CM | POA: Insufficient documentation

## 2021-05-31 DIAGNOSIS — M5416 Radiculopathy, lumbar region: Secondary | ICD-10-CM | POA: Diagnosis not present

## 2021-05-31 DIAGNOSIS — R262 Difficulty in walking, not elsewhere classified: Secondary | ICD-10-CM | POA: Diagnosis not present

## 2021-05-31 NOTE — Therapy (Signed)
[place], [place], [address] Phone: [phone]   Fax:  [phone]  Physical Therapy Evaluation  Patient Details  Name: [name] [name] MRN: [id number] Date of Birth: 1941/06/20 Referring Provider (PT): Carole Civil, MD   Encounter Date: 05/31/2021   PT End of Session - 05/31/21 0810     Visit Number 1    Number of Visits 8    Date for PT Re-Evaluation 06/28/21    Authorization Type [employer] Medicare HMO, auth [employer], VL med. necessity    PT Start Time 0815    PT Stop Time 0900    PT Time Calculation (min) 45 min    Activity Tolerance Patient tolerated treatment well    Behavior During Therapy WFL for tasks assessed/performed             Past Medical History:  Diagnosis Date   Arthritis    CAD (coronary artery disease)    CAD (coronary artery disease) 12/04/2011   Left Main Disease with 3- vessel CAD    CAD (coronary artery disease)    seeing Dr Harl Bowie   CHF (congestive heart failure) ([place])    Deviated septum    Dyslipidemia    GERD (gastroesophageal reflux disease)    Heart attack ([place])    Hyperlipidemia    Hypertension    Hypothyroidism    MI (myocardial infarction) ([place]) 1997   Neuropathy    Obesity (BMI 30-39.9) 09/02/2018   PONV (postoperative nausea and vomiting)    has in past, no problems after most recent surgery   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   LIMA to LAD, SVG to OM2, SVG to PDA, EVH via right thigh   S/P carotid endarterectomy 02/29/2012   Right CEA for asymptomatic severe right ICA stenosis    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   Left Ear Rim (Cx3,5FU)   Shortness of breath    with exertion   Sleep apnea    CPAP, sleep [facility] at [facility]   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   80-99% stenosis RICA, asymptomatic   Thyroid nodule    Wears glasses    or contacts    Past Surgical History:  Procedure Laterality Date   ADENOIDECTOMY     CARDIAC CATHETERIZATION      02/22/12   CARDIOVASCULAR STRESS TEST  7/13   CAROTID ENDARTERECTOMY     COLONOSCOPY  15-20years and 12/04/11   Done at [place]  12/11/2011   Procedure: COLONOSCOPY;  Surgeon: Rogene Houston, MD;  [facility];  Service: Endoscopy;  Laterality: N/A;  830   CORONARY ARTERY BYPASS GRAFT  02/29/2012   Procedure: CORONARY ARTERY BYPASS GRAFTING (CABG);  Surgeon: Rexene Alberts, MD;  Location: [place];  Service: Open Heart Surgery;  Laterality: N/A;  times three using Left Internal Mammary Artery and Right Greater Saphenous Vein Graft Harvested Endoscopically   CORONARY STENT PLACEMENT     ENDARTERECTOMY  02/29/2012   Procedure: ENDARTERECTOMY CAROTID;  Surgeon: Angelia Mould, MD;  Location: [facility];  Service: Vascular;  Laterality: Right;   FOOT SURGERY  06/30/11   artificial joint in big toe right foot    KNEE ARTHROSCOPY WITH MEDIAL MENISECTOMY Left 07/15/2019   Procedure: KNEE ARTHROSCOPY WITH MEDIAL MENISCECTOMY AND LATERAL MENISCECTOMY;  Surgeon: Carole Civil, MD;  [facility];  Service: Orthopedics;  Laterality: Left;   LEFT HEART CATHETERIZATION WITH CORONARY ANGIOGRAM N/A 02/20/2012  Procedure: LEFT HEART CATHETERIZATION WITH CORONARY ANGIOGRAM;  Surgeon: [place] Sine, MD;  Location: [facility] CATH LAB;  Service: Cardiovascular;  Laterality: N/A;   NASAL SEPTUM SURGERY     Totol thyroidectomy  02/06/2011   TRANSESOPHAGEAL ECHOCARDIOGRAM     UPPER GASTROINTESTINAL ENDOSCOPY  35-40 years ago   [facility]    There were no vitals filed for this visit.    Subjective Assessment - 05/31/21 0814     Subjective Notes continued burning and feeling of fire in his feet towards the night and notes back pain. Notes his back tends to give out when walking and feels tightness in lower legs when walking. Pt had recent lumbar spinal injection which did not help. Notes limitations in walking and activity and has experiecned a fall since his last PT  sessions    Currently in Pain? Yes    Pain Score 4     Pain Location Back    Pain Orientation Right;Left    Pain Type Chronic pain    Pain Radiating Towards bilateral LE    Pain Onset More than a month ago    Pain Frequency Intermittent    Aggravating Factors  prolonged walking, standing    Pain Relieving Factors sitting                [facility] PT Assessment - 05/31/21 0001       Assessment   Medical Diagnosis Spinal stenosis of lumbar region with neurogenic claudication    Referring Provider (PT) Carole Civil, MD      Balance Screen   Has the patient fallen in the past 6 months Yes    How many times? 1    Has the patient had a decrease in activity level because of a fear of falling?  No    Is the patient reluctant to leave their home because of a fear of falling?  No      Home Ecologist residence    Living Arrangements Spouse/significant other      Prior Function   Level of Independence Independent      Observation/Other Assessments   Focus on Therapeutic Outcomes (FOTO)  58.2% function      Functional Tests   Functional tests Single leg stance      Single Leg Stance   Comments 2-3 sec      ROM / Strength   AROM / PROM / Strength Strength;AROM      AROM   AROM Assessment Site Lumbar    Lumbar Flexion 10% limited    Lumbar Extension 10% limited      Strength   Strength Assessment Site Lumbar    Lumbar Flexion 2+/5    Lumbar Extension 2+/5      Ambulation/Gait   Ambulation/Gait Yes    Ambulation/Gait Assistance 7: Independent    Gait Comments 2.5 minutes with onset of LE symptoms                        Objective measurements completed on examination: See above findings.       [facility] Adult PT Treatment/Exercise - 05/31/21 0001       Exercises   Exercises Lumbar      Lumbar Exercises: Stretches   Single Knee to Chest Stretch Right;Left;1 rep;60 seconds    Double Knee to Chest Stretch 1 rep;60  seconds    Lower Trunk Rotation 2 reps;60 seconds  Lumbar Exercises: Supine   Isometric Hip Flexion 10 reps;2 seconds                     PT Education - 05/31/21 0859     Education Details education regarding lumbar stenosis pathophysiology    Person(s) Educated Patient    Methods Explanation;Handout    Comprehension Verbalized understanding              PT Short Term Goals - 05/31/21 0928       PT SHORT TERM GOAL #1   Title PT to be I in HEP to allow decreased nerve irritation and improve activity tolerance    Time 2    Period Weeks    Status New    Target Date 06/14/21      PT SHORT TERM GOAL #2   Title PT to be abe able to walk for 10 minutes without having to rest.    Baseline 2.5 min at 1.0 mph treadmill    Time 2    Period Weeks    Status New    Target Date 06/14/21      PT SHORT TERM GOAL #3   Title Demo trunk strength 3/5 flexion to improve stability    Baseline 2+/5 trunk flexion    Time 2    Period Weeks    Status New    Target Date 06/14/21               PT Long Term Goals - 05/31/21 0930       PT LONG TERM GOAL #1   Title Patient will improve FOTO score by at least 5 points in order to indicate improved tolerance to activity.    Baseline 58.2% function    Time 4    Period Weeks    Status New    Target Date 06/28/21      PT LONG TERM GOAL #2   Title Demo trunk flexion 3+/5 to improve stability and reduce back pain    Baseline 2+/5    Time 4    Period Weeks    Status New    Target Date 06/28/21      PT LONG TERM GOAL #3   Title PT to be able to single leg stance on both legs for at least 30" to reduce risk of falls    Baseline 2-3 sec    Time 4    Period Weeks    Status New    Target Date 06/28/21                    Plan - 05/31/21 0859     Clinical Impression Statement Patient is a  80 yo male presenting to physical therapy with c/o back and BLE pain. He presents with pain limited deficits in  trunk strength, ROM, endurance, postural impairments, spinal mobility and functional mobility with ADL. He is having to modify and restrict ADL as indicated by FOTO score as well as subjective information and objective measures which is affecting overall participation. Patient will benefit from skilled physical therapy in order to improve function and reduce impairment.    Personal Factors and Comorbidities Age;Time since onset of injury/illness/exacerbation;Past/Current Experience    Examination-Activity Limitations Lift;Stand;Stairs;Squat;Reach Overhead;Locomotion Level    Examination-Participation Restrictions Cleaning;Community Activity;Yard Work    Stability/Clinical Decision Making Stable/Uncomplicated    Designer, jewellery Low    Rehab Potential Fair    PT Frequency 2x / week  PT Duration 4 weeks    PT Treatment/Interventions ADLs/Self Care Home Management;Electrical Stimulation;Traction;Gait training;Stair training;Functional mobility training;Therapeutic activities;Therapeutic exercise;Balance training;Manual techniques;Dry needling;Joint Manipulations;Spinal Manipulations    PT Next Visit Plan Continue with flexion-bias ROM and general strength for HEP    PT Home Exercise Plan LTR, SKTC, DKTC, hip flex iso    Consulted and Agree with Plan of Care Patient             Patient will benefit from skilled therapeutic intervention in order to improve the following deficits and impairments:  Difficulty walking, Decreased activity tolerance, Decreased balance, Decreased strength, Postural dysfunction, Improper body mechanics, Pain  Visit Diagnosis: Difficulty in walking, not elsewhere classified  Muscle weakness (generalized)  Other symptoms and signs involving the musculoskeletal system  Unsteadiness on feet     Problem List Patient Active Problem List   Diagnosis Date Noted   S/P left knee arthroscopy 07/15/19 07/31/2019   Lateral meniscus derangement, left     Derangement of posterior horn of medial meniscus of left knee    OSA (obstructive sleep apnea) 09/02/2018   Obesity (BMI 30-39.9) 09/02/2018   Aftercare following surgery of the circulatory system, NEC 04/15/2014   Occlusion and stenosis of carotid artery without mention of cerebral infarction 03/20/2012   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   S/P carotid endarterectomy 02/29/2012   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   MI (myocardial infarction) ([place])    Heart attack ([place])    HTN (hypertension) 12/04/2011   Hyperlipemia 12/04/2011   GERD (gastroesophageal reflux disease) 12/04/2011   CAD (coronary artery disease) 12/04/2011   Hypothyroidism, postsurgical 07/13/2011   Thyroid nodule, uninodular 02/22/2011    Toniann Fail, PT 05/31/2021, 9:34 AM  [facility] [address] [place], [place], [address] Phone: [phone]   Fax:  [phone]  Name: LYNWOOD KUBISIAK MRN: [id number] Date of Birth: 10-24-1940

## 2021-06-03 ENCOUNTER — Other Ambulatory Visit: Payer: Self-pay

## 2021-06-03 ENCOUNTER — Encounter (HOSPITAL_COMMUNITY): Payer: Self-pay

## 2021-06-03 ENCOUNTER — Ambulatory Visit (HOSPITAL_COMMUNITY): Payer: Medicare HMO

## 2021-06-03 DIAGNOSIS — R2681 Unsteadiness on feet: Secondary | ICD-10-CM

## 2021-06-03 DIAGNOSIS — M5416 Radiculopathy, lumbar region: Secondary | ICD-10-CM

## 2021-06-03 DIAGNOSIS — R262 Difficulty in walking, not elsewhere classified: Secondary | ICD-10-CM

## 2021-06-03 DIAGNOSIS — M6281 Muscle weakness (generalized): Secondary | ICD-10-CM | POA: Diagnosis not present

## 2021-06-03 DIAGNOSIS — R29898 Other symptoms and signs involving the musculoskeletal system: Secondary | ICD-10-CM | POA: Diagnosis not present

## 2021-06-03 NOTE — Therapy (Signed)
[facility], [place], [address] Phone: [phone]   Fax:  [phone]  Physical Therapy Treatment  Patient Details  Name: [name] [name] MRN: [id number] Date of Birth: September 17, 1940 Referring Provider (PT): Carole Civil, MD   Encounter Date: 06/03/2021   PT End of Session - 06/03/21 1041     Visit Number 2    Number of Visits 8    Date for PT Re-Evaluation 06/28/21    Authorization Type [employer] Medicare HMO, auth [employer], VL med. necessity    PT Start Time 1030    PT Stop Time 1115    PT Time Calculation (min) 45 min    Activity Tolerance Patient tolerated treatment well    Behavior During Therapy WFL for tasks assessed/performed             Past Medical History:  Diagnosis Date   Arthritis    CAD (coronary artery disease)    CAD (coronary artery disease) 12/04/2011   Left Main Disease with 3- vessel CAD    CAD (coronary artery disease)    seeing Dr Harl Bowie   CHF (congestive heart failure) ([place])    Deviated septum    Dyslipidemia    GERD (gastroesophageal reflux disease)    Heart attack ([place])    Hyperlipidemia    Hypertension    Hypothyroidism    MI (myocardial infarction) ([place]) 1997   Neuropathy    Obesity (BMI 30-39.9) 09/02/2018   PONV (postoperative nausea and vomiting)    has in past, no problems after most recent surgery   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   LIMA to LAD, SVG to OM2, SVG to PDA, EVH via right thigh   S/P carotid endarterectomy 02/29/2012   Right CEA for asymptomatic severe right ICA stenosis    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   Left Ear Rim (Cx3,5FU)   Shortness of breath    with exertion   Sleep apnea    CPAP, sleep study at [facility]   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   80-99% stenosis RICA, asymptomatic   Thyroid nodule    Wears glasses    or contacts    Past Surgical History:  Procedure Laterality Date   ADENOIDECTOMY     CARDIAC CATHETERIZATION      02/22/12   CARDIOVASCULAR STRESS TEST  7/13   CAROTID ENDARTERECTOMY     COLONOSCOPY  15-20years and 12/04/11   Done at [facility]  12/11/2011   Procedure: COLONOSCOPY;  Surgeon: Rogene Houston, MD;  [facility];  Service: Endoscopy;  Laterality: N/A;  830   CORONARY ARTERY BYPASS GRAFT  02/29/2012   Procedure: CORONARY ARTERY BYPASS GRAFTING (CABG);  Surgeon: Rexene Alberts, MD;  Location: [facility];  Service: Open Heart Surgery;  Laterality: N/A;  times three using Left Internal Mammary Artery and Right Greater Saphenous Vein Graft Harvested Endoscopically   CORONARY STENT PLACEMENT     ENDARTERECTOMY  02/29/2012   Procedure: ENDARTERECTOMY CAROTID;  Surgeon: Angelia Mould, MD;  Location: [facility];  Service: Vascular;  Laterality: Right;   FOOT SURGERY  06/30/11   artificial joint in big toe right foot    KNEE ARTHROSCOPY WITH MEDIAL MENISECTOMY Left 07/15/2019   Procedure: KNEE ARTHROSCOPY WITH MEDIAL MENISCECTOMY AND LATERAL MENISCECTOMY;  Surgeon: Carole Civil, MD;  [facility]: [facility];  Service: Orthopedics;  Laterality: Left;   LEFT HEART CATHETERIZATION WITH CORONARY ANGIOGRAM N/A 02/20/2012  Procedure: LEFT HEART CATHETERIZATION WITH CORONARY ANGIOGRAM;  Surgeon: Troy Sine, MD;  Location[facility] LAB;  Service: Cardiovascular;  Laterality: N/A;   NASAL SEPTUM SURGERY     Totol thyroidectomy  02/06/2011   TRANSESOPHAGEAL ECHOCARDIOGRAM     UPPER GASTROINTESTINAL ENDOSCOPY  35-40 years ago   [facility]    There were no vitals filed for this visit.   Subjective Assessment - 06/03/21 1033     Subjective No change in symptoms, notes discomfort in knees when walking    Currently in Pain? Yes    Pain Score 5     Pain Location Leg    Pain Orientation Right;Left    Pain Descriptors / Indicators Aching;Tightness;Cramping    Pain Type Chronic pain    Pain Onset More than a month ago                                [facility] Adult PT Treatment/Exercise - 06/03/21 0001       Lumbar Exercises: Stretches   Passive Hamstring Stretch Right;Left;2 reps;60 seconds    Passive Hamstring Stretch Limitations long sitting    Single Knee to Chest Stretch Right;Left;3 reps;30 seconds    Double Knee to Chest Stretch 2 reps;60 seconds    Other Lumbar Stretch Exercise seated forward flexion 10x 10 sec      Lumbar Exercises: Aerobic   Tread Mill 2.0 mph x 3 min 42 sec with onset of symptoms at start of session. Additional trial after flexion-biased stretches at 2.0 mph x 3:45 again with onset of BLE symptoms (neurogenic claudication)      Lumbar Exercises: Supine   Isometric Hip Flexion 10 reps;2 seconds                       PT Short Term Goals - 05/31/21 1610       PT SHORT TERM GOAL #1   Title PT to be I in HEP to allow decreased nerve irritation and improve activity tolerance    Time 2    Period Weeks    Status New    Target Date 06/14/21      PT SHORT TERM GOAL #2   Title PT to be abe able to walk for 10 minutes without having to rest.    Baseline 2.5 min at 1.0 mph treadmill    Time 2    Period Weeks    Status New    Target Date 06/14/21      PT SHORT TERM GOAL #3   Title Demo trunk strength 3/5 flexion to improve stability    Baseline 2+/5 trunk flexion    Time 2    Period Weeks    Status New    Target Date 06/14/21               PT Long Term Goals - 05/31/21 0930       PT LONG TERM GOAL #1   Title Patient will improve FOTO score by at least 5 points in order to indicate improved tolerance to activity.    Baseline 58.2% function    Time 4    Period Weeks    Status New    Target Date 06/28/21      PT LONG TERM GOAL #2   Title Demo trunk flexion 3+/5 to improve stability and reduce back pain    Baseline 2+/5    Time 4  Period Weeks    Status New    Target Date 06/28/21      PT LONG TERM GOAL #3   Title PT to be able to single leg stance on both legs for at  least 30" to reduce risk of falls    Baseline 2-3 sec    Time 4    Period Weeks    Status New    Target Date 06/28/21                   Plan - 06/03/21 1119     Clinical Impression Statement Experiencing symptoms of neurogenic claudication in BLE with walking and notable increase in his clumsiness with LE with onset of symptoms/fatigue, e.g. more pronounced foot slap at initial contact and increased lateral deviation in stance phase. No change in symptoms when gait trial was preceeded with flexion-bias stretches.  Continued sessions to develop HEP for hip/core strengthening to improve stabilization and core activation with mobility to assess if symptoms decrease    Personal Factors and Comorbidities Age;Time since onset of injury/illness/exacerbation;Past/Current Experience    Examination-Activity Limitations Lift;Stand;Stairs;Squat;Reach Overhead;Locomotion Level    Examination-Participation Restrictions Cleaning;Community Activity;Yard Work    Stability/Clinical Decision Making Stable/Uncomplicated    Rehab Potential Fair    PT Frequency 2x / week    PT Duration 4 weeks    PT Treatment/Interventions ADLs/Self Care Home Management;Electrical Stimulation;Traction;Gait training;Stair training;Functional mobility training;Therapeutic activities;Therapeutic exercise;Balance training;Manual techniques;Dry needling;Joint Manipulations;Spinal Manipulations    PT Next Visit Plan supine/sidelying hip and core strengthening for HEP    PT Home Exercise Plan LTR, SKTC, DKTC, hip flex iso, seated forward flexion    Consulted and Agree with Plan of Care Patient             Patient will benefit from skilled therapeutic intervention in order to improve the following deficits and impairments:  Difficulty walking, Decreased activity tolerance, Decreased balance, Decreased strength, Postural dysfunction, Improper body mechanics, Pain  Visit Diagnosis: Difficulty in walking, not elsewhere  classified  Muscle weakness (generalized)  Other symptoms and signs involving the musculoskeletal system  Unsteadiness on feet  Radiculopathy, lumbar region     Problem List Patient Active Problem List   Diagnosis Date Noted   S/P left knee arthroscopy 07/15/19 07/31/2019   Lateral meniscus derangement, left    Derangement of posterior horn of medial meniscus of left knee    OSA (obstructive sleep apnea) 09/02/2018   Obesity (BMI 30-39.9) 09/02/2018   Aftercare following surgery of the circulatory system, NEC 04/15/2014   Occlusion and stenosis of carotid artery without mention of cerebral infarction 03/20/2012   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

## 2021-06-06 ENCOUNTER — Other Ambulatory Visit: Payer: Self-pay

## 2021-06-06 ENCOUNTER — Encounter (HOSPITAL_COMMUNITY): Payer: Self-pay | Admitting: Physical Therapy

## 2021-06-06 ENCOUNTER — Ambulatory Visit (HOSPITAL_COMMUNITY): Payer: Medicare HMO | Admitting: Physical Therapy

## 2021-06-06 DIAGNOSIS — M5416 Radiculopathy, lumbar region: Secondary | ICD-10-CM | POA: Diagnosis not present

## 2021-06-06 DIAGNOSIS — R29898 Other symptoms and signs involving the musculoskeletal system: Secondary | ICD-10-CM | POA: Diagnosis not present

## 2021-06-06 DIAGNOSIS — M6281 Muscle weakness (generalized): Secondary | ICD-10-CM | POA: Diagnosis not present

## 2021-06-06 DIAGNOSIS — R2681 Unsteadiness on feet: Secondary | ICD-10-CM

## 2021-06-06 DIAGNOSIS — R262 Difficulty in walking, not elsewhere classified: Secondary | ICD-10-CM | POA: Diagnosis not present

## 2021-06-06 NOTE — Patient Instructions (Signed)
Access Code: [email or web address] URL: [email or web address] Date: 06/06/2021 Prepared by: [place]  Exercises Sidelying Open Book - 1 x daily - 7 x weekly - 10 reps - 5 second hold Sidelying Hip Abduction - 1 x daily - 7 x weekly - 2 sets - 10 reps

## 2021-06-06 NOTE — Therapy (Signed)
[address] [place], [place], [address] Phone: [phone]   Fax:  [phone]  Physical Therapy Treatment  Patient Details  Name: [name] [name] MRN: [id number] Date of Birth: August 12, 1941 Referring Provider (PT): Carole Civil, MD   Encounter Date: 06/06/2021   PT End of Session - 06/06/21 1048     Visit Number 3    Number of Visits 8    Date for PT Re-Evaluation 06/28/21    Authorization Type [employer] Medicare HMO, auth [employer], VL med. necessity    PT Start Time 1048    PT Stop Time 1128    PT Time Calculation (min) 40 min    Activity Tolerance Patient tolerated treatment well    Behavior During Therapy WFL for tasks assessed/performed             Past Medical History:  Diagnosis Date   Arthritis    CAD (coronary artery disease)    CAD (coronary artery disease) 12/04/2011   Left Main Disease with 3- vessel CAD    CAD (coronary artery disease)    seeing Dr Harl Bowie   CHF (congestive heart failure) ([place])    Deviated septum    Dyslipidemia    GERD (gastroesophageal reflux disease)    Heart attack ([place])    Hyperlipidemia    Hypertension    Hypothyroidism    MI (myocardial infarction) ([place]) 1997   Neuropathy    Obesity (BMI 30-39.9) 09/02/2018   PONV (postoperative nausea and vomiting)    has in past, no problems after most recent surgery   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   LIMA to LAD, SVG to OM2, SVG to PDA, EVH via right thigh   S/P carotid endarterectomy 02/29/2012   Right CEA for asymptomatic severe right ICA stenosis    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   Left Ear Rim (Cx3,5FU)   Shortness of breath    with exertion   Sleep apnea    CPAP, sleep study at [facility]   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   80-99% stenosis RICA, asymptomatic   Thyroid nodule    Wears glasses    or contacts    Past Surgical History:  Procedure Laterality Date   ADENOIDECTOMY     CARDIAC CATHETERIZATION      02/22/12   CARDIOVASCULAR STRESS TEST  7/13   CAROTID ENDARTERECTOMY     COLONOSCOPY  15-20years and 12/04/11   Done at [facility]  12/11/2011   Procedure: COLONOSCOPY;  Surgeon: Rogene Houston, MD;  [facility];  Service: Endoscopy;  Laterality: N/A;  830   CORONARY ARTERY BYPASS GRAFT  02/29/2012   Procedure: CORONARY ARTERY BYPASS GRAFTING (CABG);  Surgeon: Rexene Alberts, MD;  Location: [facility];  Service: Open Heart Surgery;  Laterality: N/A;  times three using Left Internal Mammary Artery and Right Greater Saphenous Vein Graft Harvested Endoscopically   CORONARY STENT PLACEMENT     ENDARTERECTOMY  02/29/2012   Procedure: ENDARTERECTOMY CAROTID;  Surgeon: Angelia Mould, MD;  Location: [facility];  Service: Vascular;  Laterality: Right;   FOOT SURGERY  06/30/11   artificial joint in big toe right foot    KNEE ARTHROSCOPY WITH MEDIAL MENISECTOMY Left 07/15/2019   Procedure: KNEE ARTHROSCOPY WITH MEDIAL MENISCECTOMY AND LATERAL MENISCECTOMY;  Surgeon: Carole Civil, MD;  Location: [facility];  Service: Orthopedics;  Laterality: Left;   LEFT HEART CATHETERIZATION WITH CORONARY ANGIOGRAM N/A 02/20/2012  Procedure: LEFT HEART CATHETERIZATION WITH CORONARY ANGIOGRAM;  Surgeon: Troy Sine, MD;  Location[facility] CATH LAB;  Service: Cardiovascular;  Laterality: N/A;   NASAL SEPTUM SURGERY     Totol thyroidectomy  02/06/2011   TRANSESOPHAGEAL ECHOCARDIOGRAM     UPPER GASTROINTESTINAL ENDOSCOPY  35-40 years ago   [facility]    There were no vitals filed for this visit.   Subjective Assessment - 06/06/21 1049     Subjective Stretches going well. Walking is still about the same.    Currently in Pain? Yes    Pain Location Leg    Pain Orientation Right;Left    Pain Descriptors / Indicators Aching;Tightness;Burning    Pain Type Chronic pain    Pain Onset More than a month ago    Pain Frequency Constant                                [facility] Adult PT Treatment/Exercise - 06/06/21 0001       Lumbar Exercises: Stretches   Single Knee to Chest Stretch Right;Left;3 reps;30 seconds    Double Knee to Chest Stretch 2 reps;60 seconds    Other Lumbar Stretch Exercise seated forward flexion 10x 10 sec      Lumbar Exercises: Supine   Bridge 10 reps    Bridge Limitations 2 sets    Isometric Hip Flexion 10 reps;5 seconds      Lumbar Exercises: Sidelying   Hip Abduction Both;10 reps    Hip Abduction Limitations 2 sets    Other Sidelying Lumbar Exercises open book 10x 5 second hold bilateral                     PT Education - 06/06/21 1049     Education Details HEP    Person(s) Educated Patient    Methods Explanation;Demonstration;Handout    Comprehension Verbalized understanding;Returned demonstration              PT Short Term Goals - 05/31/21 0928       PT SHORT TERM GOAL #1   Title PT to be I in HEP to allow decreased nerve irritation and improve activity tolerance    Time 2    Period Weeks    Status New    Target Date 06/14/21      PT SHORT TERM GOAL #2   Title PT to be abe able to walk for 10 minutes without having to rest.    Baseline 2.5 min at 1.0 mph treadmill    Time 2    Period Weeks    Status New    Target Date 06/14/21      PT SHORT TERM GOAL #3   Title Demo trunk strength 3/5 flexion to improve stability    Baseline 2+/5 trunk flexion    Time 2    Period Weeks    Status New    Target Date 06/14/21               PT Long Term Goals - 05/31/21 0930       PT LONG TERM GOAL #1   Title Patient will improve FOTO score by at least 5 points in order to indicate improved tolerance to activity.    Baseline 58.2% function    Time 4    Period Weeks    Status New    Target Date 06/28/21      PT LONG TERM GOAL #  2   Title Demo trunk flexion 3+/5 to improve stability and reduce back pain    Baseline 2+/5    Time 4    Period Weeks    Status New    Target Date 06/28/21       PT LONG TERM GOAL #3   Title PT to be able to single leg stance on both legs for at least 30" to reduce risk of falls    Baseline 2-3 sec    Time 4    Period Weeks    Status New    Target Date 06/28/21                   Plan - 06/06/21 1049     Clinical Impression Statement Continues with lumbar flexion exercises. Patient requires minimal cueing with previously completed exercises. Progressed to additional core strength and lumbar mobility exercises. Patient will continue to benefit from skilled physical therapy in order to reduce impairment and improve function.    Personal Factors and Comorbidities Age;Time since onset of injury/illness/exacerbation;Past/Current Experience    Examination-Activity Limitations Lift;Stand;Stairs;Squat;Reach Overhead;Locomotion Level    Examination-Participation Restrictions Cleaning;Community Activity;Yard Work    Stability/Clinical Decision Making Stable/Uncomplicated    Rehab Potential Fair    PT Frequency 2x / week    PT Duration 4 weeks    PT Treatment/Interventions ADLs/Self Care Home Management;Electrical Stimulation;Traction;Gait training;Stair training;Functional mobility training;Therapeutic activities;Therapeutic exercise;Balance training;Manual techniques;Dry needling;Joint Manipulations;Spinal Manipulations    PT Next Visit Plan supine/sidelying hip and core strengthening for HEP    PT Home Exercise Plan LTR, SKTC, DKTC, hip flex iso, seated forward flexion 10/24 hip abd, open book    Consulted and Agree with Plan of Care Patient             Patient will benefit from skilled therapeutic intervention in order to improve the following deficits and impairments:  Difficulty walking, Decreased activity tolerance, Decreased balance, Decreased strength, Postural dysfunction, Improper body mechanics, Pain  Visit Diagnosis: Difficulty in walking, not elsewhere classified  Muscle weakness (generalized)  Other symptoms and signs  involving the musculoskeletal system  Unsteadiness on feet  Radiculopathy, lumbar region     Problem List Patient Active Problem List   Diagnosis Date Noted   S/P left knee arthroscopy 07/15/19 07/31/2019   Lateral meniscus derangement, left    Derangement of posterior horn of medial meniscus of left knee    OSA (obstructive sleep apnea) 09/02/2018   Obesity (BMI 30-39.9) 09/02/2018   Aftercare following surgery of the circulatory system, NEC 04/15/2014   Occlusion and stenosis of carotid artery without mention of cerebral infarction 03/20/2012   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 [place], [place], [address] Phone: [phone]   Fax:  [phone]  Name: ORLIE CUNDARI MRN: [id number] Date of Birth: [birth year]

## 2021-06-07 ENCOUNTER — Institutional Professional Consult (permissible substitution): Payer: Medicare HMO | Admitting: Pulmonary Disease

## 2021-06-07 DIAGNOSIS — M48062 Spinal stenosis, lumbar region with neurogenic claudication: Secondary | ICD-10-CM | POA: Diagnosis not present

## 2021-06-10 ENCOUNTER — Ambulatory Visit (HOSPITAL_COMMUNITY): Payer: Medicare HMO

## 2021-06-10 ENCOUNTER — Other Ambulatory Visit: Payer: Self-pay

## 2021-06-10 ENCOUNTER — Encounter (HOSPITAL_COMMUNITY): Payer: Self-pay

## 2021-06-10 DIAGNOSIS — R29898 Other symptoms and signs involving the musculoskeletal system: Secondary | ICD-10-CM

## 2021-06-10 DIAGNOSIS — M5416 Radiculopathy, lumbar region: Secondary | ICD-10-CM

## 2021-06-10 DIAGNOSIS — M6281 Muscle weakness (generalized): Secondary | ICD-10-CM | POA: Diagnosis not present

## 2021-06-10 DIAGNOSIS — R262 Difficulty in walking, not elsewhere classified: Secondary | ICD-10-CM

## 2021-06-10 DIAGNOSIS — R2681 Unsteadiness on feet: Secondary | ICD-10-CM

## 2021-06-10 NOTE — Therapy (Signed)
[place], [place], [address] Phone: [phone]   Fax:  [phone]  Physical Therapy Treatment  Patient Details  Name: [name] [name] MRN: [id number] Date of Birth: 11/09/1940 Referring Provider (PT): Carole Civil, MD   Encounter Date: 06/10/2021   PT End of Session - 06/10/21 1042     Visit Number 4    Number of Visits 8    Date for PT Re-Evaluation 06/28/21    Authorization Type [employer] HMO, auth [employer], VL med. necessity    PT Start Time 1030    PT Stop Time 1115    PT Time Calculation (min) 45 min    Activity Tolerance Patient tolerated treatment well    Behavior During Therapy WFL for tasks assessed/performed             Past Medical History:  Diagnosis Date   Arthritis    CAD (coronary artery disease)    CAD (coronary artery disease) 12/04/2011   Left Main Disease with 3- vessel CAD    CAD (coronary artery disease)    seeing Dr Harl Bowie   CHF (congestive heart failure) ([place])    Deviated septum    Dyslipidemia    GERD (gastroesophageal reflux disease)    Heart attack ([place])    Hyperlipidemia    Hypertension    Hypothyroidism    MI (myocardial infarction) ([place]) 1997   Neuropathy    Obesity (BMI 30-39.9) 09/02/2018   PONV (postoperative nausea and vomiting)    has in past, no problems after most recent surgery   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   LIMA to LAD, SVG to OM2, SVG to PDA, EVH via right thigh   S/P carotid endarterectomy 02/29/2012   Right CEA for asymptomatic severe right ICA stenosis    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   Left Ear Rim (Cx3,5FU)   Shortness of breath    with exertion   Sleep apnea    CPAP, sleep study at [facility]   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   80-99% stenosis RICA, asymptomatic   Thyroid nodule    Wears glasses    or contacts    Past Surgical History:  Procedure Laterality Date   ADENOIDECTOMY     CARDIAC CATHETERIZATION      02/22/12   CARDIOVASCULAR STRESS TEST  7/13   CAROTID ENDARTERECTOMY     COLONOSCOPY  15-20years and 12/04/11   Done at [place]  12/11/2011   Procedure: COLONOSCOPY;  Surgeon: Rogene Houston, MD;  [facility];  Service: Endoscopy;  Laterality: N/A;  830   CORONARY ARTERY BYPASS GRAFT  02/29/2012   Procedure: CORONARY ARTERY BYPASS GRAFTING (CABG);  Surgeon: Rexene Alberts, MD;  Location: [place];  Service: Open Heart Surgery;  Laterality: N/A;  times three using Left Internal Mammary Artery and Right Greater Saphenous Vein Graft Harvested Endoscopically   CORONARY STENT PLACEMENT     ENDARTERECTOMY  02/29/2012   Procedure: ENDARTERECTOMY CAROTID;  Surgeon: Angelia Mould, MD;  Location: [place];  Service: Vascular;  Laterality: Right;   FOOT SURGERY  06/30/11   artificial joint in big toe right foot    KNEE ARTHROSCOPY WITH MEDIAL MENISECTOMY Left 07/15/2019   Procedure: KNEE ARTHROSCOPY WITH MEDIAL MENISCECTOMY AND LATERAL MENISCECTOMY;  Surgeon: Carole Civil, MD;  [facility];  Service: Orthopedics;  Laterality: Left;   LEFT HEART CATHETERIZATION WITH CORONARY ANGIOGRAM N/A 02/20/2012  Procedure: LEFT HEART CATHETERIZATION WITH CORONARY ANGIOGRAM;  Surgeon: Troy Sine, MD;  [facility]: [facility] C[facility] LAB;  Service: Cardiovascular;  Laterality: N/A;   NASAL SEPTUM SURGERY     Totol thyroidectomy  02/06/2011   TRANSESOPHAGEAL ECHOCARDIOGRAM     UPPER GASTROINTESTINAL ENDOSCOPY  35-40 years ago   [facility]    There were no vitals filed for this visit.   Subjective Assessment - 06/10/21 1040     Subjective "Not much change, received shot in back on Wednesday without relief"    Currently in Pain? Yes    Pain Score 5     Pain Location Leg    Pain Orientation Right;Left    Pain Descriptors / Indicators Aching;Burning;Tightness    Pain Type Chronic pain    Pain Onset More than a month ago                [facility] PT Assessment -  06/10/21 0001       Assessment   Medical Diagnosis Spinal stenosis of lumbar region with neurogenic claudication    Referring Provider (PT) Carole Civil, MD                           [facility] Adult PT Treatment/Exercise - 06/10/21 0001       Lumbar Exercises: Stretches   Passive Hamstring Stretch Right;Left;3 reps;30 seconds    Passive Hamstring Stretch Limitations standing with 12" box for UE support (forward fold)    Single Knee to Chest Stretch Right;Left;3 reps;30 seconds    Double Knee to Chest Stretch 2 reps;60 seconds      Lumbar Exercises: Aerobic   Recumbent Bike level 3 x 7 min for dynamic warm-up      Lumbar Exercises: Standing   Shoulder Extension Strengthening;Both;Theraband;Other (comment)   3x10   Theraband Level (Shoulder Extension) Level 4 (Blue)    Other Standing Lumbar Exercises paloff press w/ blue tband 3x10      Lumbar Exercises: Supine   Bent Knee Raise 15 reps    Bridge 10 reps    Bridge Limitations 2 sets    Isometric Hip Flexion 10 reps;2 seconds                       PT Short Term Goals - 05/31/21 9326       PT SHORT TERM GOAL #1   Title PT to be I in HEP to allow decreased nerve irritation and improve activity tolerance    Time 2    Period Weeks    Status New    Target Date 06/14/21      PT SHORT TERM GOAL #2   Title PT to be abe able to walk for 10 minutes without having to rest.    Baseline 2.5 min at 1.0 mph treadmill    Time 2    Period Weeks    Status New    Target Date 06/14/21      PT SHORT TERM GOAL #3   Title Demo trunk strength 3/5 flexion to improve stability    Baseline 2+/5 trunk flexion    Time 2    Period Weeks    Status New    Target Date 06/14/21               PT Long Term Goals - 05/31/21 0930       PT LONG TERM GOAL #1   Title Patient will improve  FOTO score by at least 5 points in order to indicate improved tolerance to activity.    Baseline 58.2% function    Time  4    Period Weeks    Status New    Target Date 06/28/21      PT LONG TERM GOAL #2   Title Demo trunk flexion 3+/5 to improve stability and reduce back pain    Baseline 2+/5    Time 4    Period Weeks    Status New    Target Date 06/28/21      PT LONG TERM GOAL #3   Title PT to be able to single leg stance on both legs for at least 30" to reduce risk of falls    Baseline 2-3 sec    Time 4    Period Weeks    Status New    Target Date 06/28/21                   Plan - 06/10/21 1115     Clinical Impression Statement COntinued services to improve trunk strength. No change in symptoms.  Pt notes he will likely be scheduled for back surgery since conservative measures haven't worked well    Personal Factors and Comorbidities Age;Time since onset of injury/illness/exacerbation;Past/Current Experience    Examination-Activity Limitations Lift;Stand;Stairs;Squat;Reach Overhead;Locomotion Level    Examination-Participation Restrictions Cleaning;Community Activity;Yard Work    Stability/Clinical Decision Making Stable/Uncomplicated    Rehab Potential Fair    PT Frequency 2x / week    PT Duration 4 weeks    PT Treatment/Interventions ADLs/Self Care Home Management;Electrical Stimulation;Traction;Gait training;Stair training;Functional mobility training;Therapeutic activities;Therapeutic exercise;Balance training;Manual techniques;Dry needling;Joint Manipulations;Spinal Manipulations    PT Next Visit Plan supine/sidelying hip and core strengthening for HEP    PT Home Exercise Plan LTR, SKTC, DKTC, hip flex iso, seated forward flexion 10/24 hip abd, open book    Consulted and Agree with Plan of Care Patient             Patient will benefit from skilled therapeutic intervention in order to improve the following deficits and impairments:  Difficulty walking, Decreased activity tolerance, Decreased balance, Decreased strength, Postural dysfunction, Improper body mechanics,  Pain  Visit Diagnosis: Difficulty in walking, not elsewhere classified  Muscle weakness (generalized)  Other symptoms and signs involving the musculoskeletal system  Unsteadiness on feet  Radiculopathy, lumbar region     Problem List Patient Active Problem List   Diagnosis Date Noted   S/P left knee arthroscopy 07/15/19 07/31/2019   Lateral meniscus derangement, left    Derangement of posterior horn of medial meniscus of left knee    OSA (obstructive sleep apnea) 09/02/2018   Obesity (BMI 30-39.9) 09/02/2018   Aftercare following surgery of the circulatory system, NEC 04/15/2014   Occlusion and stenosis of carotid artery without mention of cerebral infarction 03/20/2012   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 [place], [place], [address] Phone: [phone]   Fax:  [phone]  Name: [name] [name] MRN: [id number] Date of Birth: 1941-07-02

## 2021-06-13 DIAGNOSIS — I10 Essential (primary) hypertension: Secondary | ICD-10-CM | POA: Diagnosis not present

## 2021-06-13 DIAGNOSIS — I2510 Atherosclerotic heart disease of native coronary artery without angina pectoris: Secondary | ICD-10-CM | POA: Diagnosis not present

## 2021-06-14 ENCOUNTER — Encounter (HOSPITAL_COMMUNITY): Payer: Self-pay | Admitting: Physical Therapy

## 2021-06-14 ENCOUNTER — Other Ambulatory Visit: Payer: Self-pay

## 2021-06-14 ENCOUNTER — Ambulatory Visit (HOSPITAL_COMMUNITY): Payer: Medicare HMO | Attending: Orthopedic Surgery | Admitting: Physical Therapy

## 2021-06-14 DIAGNOSIS — M6281 Muscle weakness (generalized): Secondary | ICD-10-CM | POA: Insufficient documentation

## 2021-06-14 DIAGNOSIS — R262 Difficulty in walking, not elsewhere classified: Secondary | ICD-10-CM | POA: Insufficient documentation

## 2021-06-14 DIAGNOSIS — R2681 Unsteadiness on feet: Secondary | ICD-10-CM | POA: Insufficient documentation

## 2021-06-14 DIAGNOSIS — M5416 Radiculopathy, lumbar region: Secondary | ICD-10-CM | POA: Insufficient documentation

## 2021-06-14 DIAGNOSIS — R29898 Other symptoms and signs involving the musculoskeletal system: Secondary | ICD-10-CM | POA: Diagnosis not present

## 2021-06-14 NOTE — Therapy (Signed)
[place], [place], [address] Phone: [phone]   Fax:  [phone]  Physical Therapy Treatment  Patient Details  Name: [name] [name] MRN: [id number] Date of Birth: 25-Apr-1941 Referring Provider (PT): Carole Civil, MD   Encounter Date: 06/14/2021   PT End of Session - 06/14/21 0828     Visit Number 5    Number of Visits 8    Date for PT Re-Evaluation 06/28/21    Authorization Type [employer] Medicare HMO, auth [employer], VL med. necessity    PT Start Time 0820    PT Stop Time 0900    PT Time Calculation (min) 40 min    Activity Tolerance Patient tolerated treatment well    Behavior During Therapy WFL for tasks assessed/performed             Past Medical History:  Diagnosis Date   Arthritis    CAD (coronary artery disease)    CAD (coronary artery disease) 12/04/2011   Left Main Disease with 3- vessel CAD    CAD (coronary artery disease)    seeing Dr Harl Bowie   CHF (congestive heart failure) ([place])    Deviated septum    Dyslipidemia    GERD (gastroesophageal reflux disease)    Heart attack ([place])    Hyperlipidemia    Hypertension    Hypothyroidism    MI (myocardial infarction) ([place]) 1997   Neuropathy    Obesity (BMI 30-39.9) 09/02/2018   PONV (postoperative nausea and vomiting)    has in past, no problems after most recent surgery   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   LIMA to LAD, SVG to OM2, SVG to PDA, EVH via right thigh   S/P carotid endarterectomy 02/29/2012   Right CEA for asymptomatic severe right ICA stenosis    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   Left Ear Rim (Cx3,5FU)   Shortness of breath    with exertion   Sleep apnea    CPAP, sleep study at [facility]   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   80-99% stenosis RICA, asymptomatic   Thyroid nodule    Wears glasses    or contacts    Past Surgical History:  Procedure Laterality Date   ADENOIDECTOMY     CARDIAC CATHETERIZATION      02/22/12   CARDIOVASCULAR STRESS TEST  7/13   CAROTID ENDARTERECTOMY     COLONOSCOPY  15-20years and 12/04/11   Done at [place]  12/11/2011   Procedure: COLONOSCOPY;  Surgeon: Rogene Houston, MD;  [facility];  Service: Endoscopy;  Laterality: N/A;  830   CORONARY ARTERY BYPASS GRAFT  02/29/2012   Procedure: CORONARY ARTERY BYPASS GRAFTING (CABG);  Surgeon: Rexene Alberts, MD;  Location: [place];  Service: Open Heart Surgery;  Laterality: N/A;  times three using Left Internal Mammary Artery and Right Greater Saphenous Vein Graft Harvested Endoscopically   CORONARY STENT PLACEMENT     ENDARTERECTOMY  02/29/2012   Procedure: ENDARTERECTOMY CAROTID;  Surgeon: Angelia Mould, MD;  Location: [place];  Service: Vascular;  Laterality: Right;   FOOT SURGERY  06/30/11   artificial joint in big toe right foot    KNEE ARTHROSCOPY WITH MEDIAL MENISECTOMY Left 07/15/2019   Procedure: KNEE ARTHROSCOPY WITH MEDIAL MENISCECTOMY AND LATERAL MENISCECTOMY;  Surgeon: Carole Civil, MD;  Location[facility];  Service: Orthopedics;  Laterality: Left;   LEFT HEART CATHETERIZATION WITH CORONARY ANGIOGRAM N/A 02/20/2012  Procedure: LEFT HEART CATHETERIZATION WITH CORONARY ANGIOGRAM;  Surgeon: Troy Sine, MD;  Location: [facility] CATH LAB;  Service: Cardiovascular;  Laterality: N/A;   NASAL SEPTUM SURGERY     Totol thyroidectomy  02/06/2011   TRANSESOPHAGEAL ECHOCARDIOGRAM     UPPER GASTROINTESTINAL ENDOSCOPY  35-40 years ago   [facility]    There were no vitals filed for this visit.   Subjective Assessment - 06/14/21 0828     Subjective Patient says things are the same. No better no worse. Most recent shot wasn't helpful.    Currently in Pain? Yes    Pain Score 5     Pain Location Leg    Pain Orientation Right;Left    Pain Descriptors / Indicators Aching;Burning    Pain Type Chronic pain    Pain Onset More than a month ago                                [facility] Adult PT Treatment/Exercise - 06/14/21 0001       Lumbar Exercises: Supine   Ab Set 10 reps    Bent Knee Raise 10 reps      Modalities   Modalities Traction      Traction   Type of Traction Lumbar    Min (lbs) 0    Max (lbs) 100    Hold Time 2 min    Rest Time 30 sec    Time 8 min                       PT Short Term Goals - 05/31/21 2440       PT SHORT TERM GOAL #1   Title PT to be I in HEP to allow decreased nerve irritation and improve activity tolerance    Time 2    Period Weeks    Status New    Target Date 06/14/21      PT SHORT TERM GOAL #2   Title PT to be abe able to walk for 10 minutes without having to rest.    Baseline 2.5 min at 1.0 mph treadmill    Time 2    Period Weeks    Status New    Target Date 06/14/21      PT SHORT TERM GOAL #3   Title Demo trunk strength 3/5 flexion to improve stability    Baseline 2+/5 trunk flexion    Time 2    Period Weeks    Status New    Target Date 06/14/21               PT Long Term Goals - 05/31/21 0930       PT LONG TERM GOAL #1   Title Patient will improve FOTO score by at least 5 points in order to indicate improved tolerance to activity.    Baseline 58.2% function    Time 4    Period Weeks    Status New    Target Date 06/28/21      PT LONG TERM GOAL #2   Title Demo trunk flexion 3+/5 to improve stability and reduce back pain    Baseline 2+/5    Time 4    Period Weeks    Status New    Target Date 06/28/21      PT LONG TERM GOAL #3   Title PT to be able to single leg stance on both legs  for at least 30" to reduce risk of falls    Baseline 2-3 sec    Time 4    Period Weeks    Status New    Target Date 06/28/21                   Plan - 06/14/21 0854     Clinical Impression Statement Patient notes he was recommended trial of traction by MD. Educated patient on application and benefits of mechanical traction. Reviewed core  strengthening exercises form HEP with overall good return. Will assess symptom response to traction and continue as indicated. Core strength progressions as tolerated for reduced back and leg pain and improved functional ability.    Personal Factors and Comorbidities Age;Time since onset of injury/illness/exacerbation;Past/Current Experience    Examination-Activity Limitations Lift;Stand;Stairs;Squat;Reach Overhead;Locomotion Level    Examination-Participation Restrictions Cleaning;Community Activity;Yard Work    Stability/Clinical Decision Making Stable/Uncomplicated    Rehab Potential Fair    PT Frequency 2x / week    PT Duration 4 weeks    PT Treatment/Interventions ADLs/Self Care Home Management;Electrical Stimulation;Traction;Gait training;Stair training;Functional mobility training;Therapeutic activities;Therapeutic exercise;Balance training;Manual techniques;Dry needling;Joint Manipulations;Spinal Manipulations    PT Next Visit Plan supine/sidelying hip and core strengthening progressions as tolerated. Assess response to mechanical traction    PT Home Exercise Plan LTR, SKTC, DKTC, hip flex iso, seated forward flexion 10/24 hip abd, open book    Consulted and Agree with Plan of Care Patient             Patient will benefit from skilled therapeutic intervention in order to improve the following deficits and impairments:  Difficulty walking, Decreased activity tolerance, Decreased balance, Decreased strength, Postural dysfunction, Improper body mechanics, Pain  Visit Diagnosis: Difficulty in walking, not elsewhere classified  Muscle weakness (generalized)  Other symptoms and signs involving the musculoskeletal system  Unsteadiness on feet  Radiculopathy, lumbar region     Problem List Patient Active Problem List   Diagnosis Date Noted   S/P left knee arthroscopy 07/15/19 07/31/2019   Lateral meniscus derangement, left    Derangement of posterior horn of medial meniscus of  left knee    OSA (obstructive sleep apnea) 09/02/2018   Obesity (BMI 30-39.9) 09/02/2018   Aftercare following surgery of the circulatory system, NEC 04/15/2014   Occlusion and stenosis of carotid artery without mention of cerebral infarction 03/20/2012   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 [place], [place], [address] Phone: [phone]   Fax:  [phone]  Name: [name] [name] MRN: [id number] Date of Birth: 12-30-1940

## 2021-06-16 ENCOUNTER — Other Ambulatory Visit: Payer: Self-pay

## 2021-06-16 ENCOUNTER — Ambulatory Visit (HOSPITAL_COMMUNITY): Payer: Medicare HMO

## 2021-06-16 ENCOUNTER — Encounter (HOSPITAL_COMMUNITY): Payer: Self-pay

## 2021-06-16 DIAGNOSIS — M6281 Muscle weakness (generalized): Secondary | ICD-10-CM | POA: Diagnosis not present

## 2021-06-16 DIAGNOSIS — R29898 Other symptoms and signs involving the musculoskeletal system: Secondary | ICD-10-CM | POA: Diagnosis not present

## 2021-06-16 DIAGNOSIS — M5416 Radiculopathy, lumbar region: Secondary | ICD-10-CM

## 2021-06-16 DIAGNOSIS — R262 Difficulty in walking, not elsewhere classified: Secondary | ICD-10-CM | POA: Diagnosis not present

## 2021-06-16 DIAGNOSIS — R2681 Unsteadiness on feet: Secondary | ICD-10-CM | POA: Diagnosis not present

## 2021-06-16 NOTE — Therapy (Signed)
[facility] [address] [place], [place], [address] Phone: [phone]   Fax:  [phone]  Physical Therapy Treatment  Patient Details  Name: [name] [name] MRN: [id number] Date of Birth: [birth year] Referring Provider (PT): Carole Civil, MD   Encounter Date: 06/16/2021   PT End of Session - 06/16/21 0929     Visit Number 6    Number of Visits 8    Date for PT Re-Evaluation 06/28/21    Authorization Type [employer] Medicare HMO, auth [employer], VL med. necessity    PT Start Time [phone]    PT Stop Time 0930    PT Time Calculation (min) 32 min    Activity Tolerance Patient tolerated treatment well    Behavior During Therapy WFL for tasks assessed/performed             Past Medical History:  Diagnosis Date   Arthritis    CAD (coronary artery disease)    CAD (coronary artery disease) 12/04/2011   Left Main Disease with 3- vessel CAD    CAD (coronary artery disease)    seeing Dr Harl Bowie   CHF (congestive heart failure) ([place])    Deviated septum    Dyslipidemia    GERD (gastroesophageal reflux disease)    Heart attack ([place])    Hyperlipidemia    Hypertension    Hypothyroidism    MI (myocardial infarction) ([place]) 1997   Neuropathy    Obesity (BMI 30-39.9) 09/02/2018   PONV (postoperative nausea and vomiting)    has in past, no problems after most recent surgery   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   LIMA to LAD, SVG to OM2, SVG to PDA, EVH via right thigh   S/P carotid endarterectomy 02/29/2012   Right CEA for asymptomatic severe right ICA stenosis    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   Left Ear Rim (Cx3,5FU)   Shortness of breath    with exertion   Sleep apnea    CPAP, sleep study at [facility]   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   80-99% stenosis RICA, asymptomatic   Thyroid nodule    Wears glasses    or contacts    Past Surgical History:  Procedure Laterality Date   ADENOIDECTOMY     CARDIAC CATHETERIZATION      02/22/12   CARDIOVASCULAR STRESS TEST  7/13   CAROTID ENDARTERECTOMY     COLONOSCOPY  15-20years and 12/04/11   Done at [facility]  12/11/2011   Procedure: COLONOSCOPY;  Surgeon: Rogene Houston, MD;  [facility];  Service: Endoscopy;  Laterality: N/A;  830   CORONARY ARTERY BYPASS GRAFT  02/29/2012   Procedure: CORONARY ARTERY BYPASS GRAFTING (CABG);  Surgeon: Rexene Alberts, MD;  Location: [facility];  Service: Open Heart Surgery;  Laterality: N/A;  times three using Left Internal Mammary Artery and Right Greater Saphenous Vein Graft Harvested Endoscopically   CORONARY STENT PLACEMENT     ENDARTERECTOMY  02/29/2012   Procedure: ENDARTERECTOMY CAROTID;  Surgeon: Angelia Mould, MD;  Location: [facility];  Service: Vascular;  Laterality: Right;   FOOT SURGERY  06/30/11   artificial joint in big toe right foot    KNEE ARTHROSCOPY WITH MEDIAL MENISECTOMY Left 07/15/2019   Procedure: KNEE ARTHROSCOPY WITH MEDIAL MENISCECTOMY AND LATERAL MENISCECTOMY;  Surgeon: Carole Civil, MD;  Location: [facility];  Service: Orthopedics;  Laterality: Left;   LEFT HEART CATHETERIZATION WITH CORONARY ANGIOGRAM N/A 02/20/2012  Procedure: LEFT HEART CATHETERIZATION WITH CORONARY ANGIOGRAM;  Surgeon: Troy Sine, MD;  Location: [facility] CATH LAB;  Service: Cardiovascular;  Laterality: N/A;   NASAL SEPTUM SURGERY     Totol thyroidectomy  02/06/2011   TRANSESOPHAGEAL ECHOCARDIOGRAM     UPPER GASTROINTESTINAL ENDOSCOPY  35-40 years ago   [facility]    There were no vitals filed for this visit.   Subjective Assessment - 06/16/21 0924     Subjective Patient reports lessen burning in bilateral legs after last treatment session. It lasted about 1 day; Patient unsure of any activity that made it go back up.    Currently in Pain? Yes    Pain Score 4     Pain Location Leg    Pain Orientation Right;Left    Pain Descriptors / Indicators Burning    Pain Radiating Towards knees to feet  equally    Pain Onset More than a month ago               [facility] Adult PT Treatment/Exercise - 06/16/21 0001       Lumbar Exercises: Stretches   Single Knee to Chest Stretch Right;Left;3 reps;30 seconds      Lumbar Exercises: Supine   Ab Set 10 reps    Bent Knee Raise 10 reps      Modalities   Modalities Traction      Traction   Type of Traction Lumbar    Min (lbs) 0    Max (lbs) 115    Hold Time 60    Rest Time 10    Time 12   split table, 3  steps up and down                    PT Education - 06/16/21 0928     Education Details Log roll, ab set    Person(s) Educated Patient    Methods Explanation    Comprehension Verbalized understanding              PT Short Term Goals - 06/16/21 0930       PT SHORT TERM GOAL #1   Title PT to be I in HEP to allow decreased nerve irritation and improve activity tolerance    Time 2    Period Weeks    Status On-going    Target Date 06/14/21      PT SHORT TERM GOAL #2   Title PT to be abe able to walk for 10 minutes without having to rest.    Baseline 2.5 min at 1.0 mph treadmill    Time 2    Period Weeks    Status On-going    Target Date 06/14/21      PT SHORT TERM GOAL #3   Title Demo trunk strength 3/5 flexion to improve stability    Baseline 2+/5 trunk flexion    Time 2    Period Weeks    Status On-going    Target Date 06/14/21               PT Long Term Goals - 06/16/21 0931       PT LONG TERM GOAL #1   Title Patient will improve FOTO score by at least 5 points in order to indicate improved tolerance to activity.    Baseline 58.2% function    Time 4    Period Weeks    Status On-going      PT LONG TERM GOAL #2   Title Demo  trunk flexion 3+/5 to improve stability and reduce back pain    Baseline 2+/5    Time 4    Period Weeks    Status On-going      PT LONG TERM GOAL #3   Title PT to be able to single leg stance on both legs for at least 30" to reduce risk of falls     Baseline 2-3 sec    Time 4    Period Weeks    Status On-going                   Plan - 06/16/21 0929     Clinical Impression Statement Session focused on review of proper abdominal set and not holding breathe and lumbar traction due to reports of positive response. Reviewed importance of log roll. Increased overall treatment time to 12 minutes with 60 seconds on and 10 seconds off up to 113 pounds for 54% of patient's reported body weight. Patient will continue to benefit from continued skilled physical therapy to reduce impairment and improve function.    Personal Factors and Comorbidities Age;Time since onset of injury/illness/exacerbation;Past/Current Experience    Examination-Activity Limitations Lift;Stand;Stairs;Squat;Reach Overhead;Locomotion Level    Examination-Participation Restrictions Cleaning;Community Activity;Yard Work    Stability/Clinical Decision Making Stable/Uncomplicated    Rehab Potential Fair    PT Frequency 2x / week    PT Duration 4 weeks    PT Treatment/Interventions ADLs/Self Care Home Management;Electrical Stimulation;Traction;Gait training;Stair training;Functional mobility training;Therapeutic activities;Therapeutic exercise;Balance training;Manual techniques;Dry needling;Joint Manipulations;Spinal Manipulations    PT Next Visit Plan supine/sidelying hip and core strengthening progressions as tolerated. Assess response to mechanical traction    PT Home Exercise Plan LTR, SKTC, DKTC, hip flex iso, seated forward flexion 10/24 hip abd, open book    Consulted and Agree with Plan of Care Patient             Patient will benefit from skilled therapeutic intervention in order to improve the following deficits and impairments:  Difficulty walking, Decreased activity tolerance, Decreased balance, Decreased strength, Postural dysfunction, Improper body mechanics, Pain  Visit Diagnosis: Difficulty in walking, not elsewhere classified  Muscle weakness  (generalized)  Other symptoms and signs involving the musculoskeletal system  Radiculopathy, lumbar region     Problem List Patient Active Problem List   Diagnosis Date Noted   S/P left knee arthroscopy 07/15/19 07/31/2019   Lateral meniscus derangement, left    Derangement of posterior horn of medial meniscus of left knee    OSA (obstructive sleep apnea) 09/02/2018   Obesity (BMI 30-39.9) 09/02/2018   Aftercare following surgery of the circulatory system, NEC 04/15/2014   Occlusion and stenosis of carotid artery without mention of cerebral infarction 03/20/2012   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 Hartnett-Rands, MS, PT Per [place] [phone]  Jeannie Done, PT 06/16/2021, 9:55 AM  [facility] [address] [place], [place], [address] Phone: [phone]   Fax:  [phone]  Name: [name] [name] MRN: [id number] Date of Birth: 01-16-1941

## 2021-06-21 ENCOUNTER — Ambulatory Visit (HOSPITAL_COMMUNITY): Payer: Medicare HMO

## 2021-06-21 ENCOUNTER — Other Ambulatory Visit: Payer: Self-pay

## 2021-06-21 DIAGNOSIS — R2681 Unsteadiness on feet: Secondary | ICD-10-CM

## 2021-06-21 DIAGNOSIS — R29898 Other symptoms and signs involving the musculoskeletal system: Secondary | ICD-10-CM

## 2021-06-21 DIAGNOSIS — M5416 Radiculopathy, lumbar region: Secondary | ICD-10-CM

## 2021-06-21 DIAGNOSIS — M6281 Muscle weakness (generalized): Secondary | ICD-10-CM | POA: Diagnosis not present

## 2021-06-21 DIAGNOSIS — R262 Difficulty in walking, not elsewhere classified: Secondary | ICD-10-CM | POA: Diagnosis not present

## 2021-06-21 NOTE — Therapy (Signed)
[facility] [place], [place], [address] Phone: [phone]   Fax:  [phone]  Physical Therapy Treatment  Patient Details  Name: [name] [name] MRN: [id number] Date of Birth: 15-Jan-1941 Referring Provider (PT): Carole Civil, MD   Encounter Date: 06/21/2021   PT End of Session - 06/21/21 0832     Visit Number 7    Number of Visits 8    Date for PT Re-Evaluation 06/28/21    Authorization Type [employer] Medicare HMO, auth [employer], VL med. necessity    PT Start Time 0815    PT Stop Time 0900    PT Time Calculation (min) 45 min    Activity Tolerance Patient tolerated treatment well    Behavior During Therapy WFL for tasks assessed/performed             Past Medical History:  Diagnosis Date   Arthritis    CAD (coronary artery disease)    CAD (coronary artery disease) 12/04/2011   Left Main Disease with 3- vessel CAD    CAD (coronary artery disease)    seeing Dr Harl Bowie   CHF (congestive heart failure) ([place])    Deviated septum    Dyslipidemia    GERD (gastroesophageal reflux disease)    Heart attack ([place])    Hyperlipidemia    Hypertension    Hypothyroidism    MI (myocardial infarction) ([place]) 1997   Neuropathy    Obesity (BMI 30-39.9) 09/02/2018   PONV (postoperative nausea and vomiting)    has in past, no problems after most recent surgery   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   LIMA to LAD, SVG to OM2, SVG to PDA, EVH via right thigh   S/P carotid endarterectomy 02/29/2012   Right CEA for asymptomatic severe right ICA stenosis    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   Left Ear Rim (Cx3,5FU)   Shortness of breath    with exertion   Sleep apnea    CPAP, sleep [facility] at [facility]   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   80-99% stenosis RICA, asymptomatic   Thyroid nodule    Wears glasses    or contacts    Past Surgical History:  Procedure Laterality Date   ADENOIDECTOMY     CARDIAC CATHETERIZATION      02/22/12   CARDIOVASCULAR STRESS TEST  7/13   CAROTID ENDARTERECTOMY     COLONOSCOPY  15-20years and 12/04/11   Done at [facility]  12/11/2011   Procedure: COLONOSCOPY;  Surgeon: Rogene Houston, MD;  [facility];  Service: Endoscopy;  Laterality: N/A;  830   CORONARY ARTERY BYPASS GRAFT  02/29/2012   Procedure: CORONARY ARTERY BYPASS GRAFTING (CABG);  Surgeon: Rexene Alberts, MD;  Location: [facility];  Service: Open Heart Surgery;  Laterality: N/A;  times three using Left Internal Mammary Artery and Right Greater Saphenous Vein Graft Harvested Endoscopically   CORONARY STENT PLACEMENT     ENDARTERECTOMY  02/29/2012   Procedure: ENDARTERECTOMY CAROTID;  Surgeon: Angelia Mould, MD;  Location: [facility];  Service: Vascular;  Laterality: Right;   FOOT SURGERY  06/30/11   artificial joint in big toe right foot    KNEE ARTHROSCOPY WITH MEDIAL MENISECTOMY Left 07/15/2019   Procedure: KNEE ARTHROSCOPY WITH MEDIAL MENISCECTOMY AND LATERAL MENISCECTOMY;  Surgeon: Carole Civil, MD;  Location: [facility];  Service: Orthopedics;  Laterality: Left;   LEFT HEART CATHETERIZATION WITH CORONARY ANGIOGRAM N/A 02/20/2012  Procedure: LEFT HEART CATHETERIZATION WITH CORONARY ANGIOGRAM;  Surgeon: Troy Sine, MD;  Location: [facility] CATH LAB;  Service: Cardiovascular;  Laterality: N/A;   NASAL SEPTUM SURGERY     Totol thyroidectomy  02/06/2011   TRANSESOPHAGEAL ECHOCARDIOGRAM     UPPER GASTROINTESTINAL ENDOSCOPY  35-40 years ago   [facility]    There were no vitals filed for this visit.   Subjective Assessment - 06/21/21 0829     Subjective Some improvement with lumbar traction with about 2 day relief    Currently in Pain? Yes    Pain Score 4     Pain Location Leg    Pain Orientation Right;Left    Pain Descriptors / Indicators Burning    Pain Type Chronic pain    Pain Onset More than a month ago                               [facility] Adult PT  Treatment/Exercise - 06/21/21 0001       Lumbar Exercises: Supine   Ab Set 10 reps    Bent Knee Raise 10 reps      Modalities   Modalities Traction      Traction   Type of Traction Lumbar    Min (lbs) 0    Max (lbs) 115    Hold Time 60    Rest Time 10    Time 15                       PT Short Term Goals - 06/16/21 0930       PT SHORT TERM GOAL #1   Title PT to be I in HEP to allow decreased nerve irritation and improve activity tolerance    Time 2    Period Weeks    Status On-going    Target Date 06/14/21      PT SHORT TERM GOAL #2   Title PT to be abe able to walk for 10 minutes without having to rest.    Baseline 2.5 min at 1.0 mph treadmill    Time 2    Period Weeks    Status On-going    Target Date 06/14/21      PT SHORT TERM GOAL #3   Title Demo trunk strength 3/5 flexion to improve stability    Baseline 2+/5 trunk flexion    Time 2    Period Weeks    Status On-going    Target Date 06/14/21               PT Long Term Goals - 06/16/21 0931       PT LONG TERM GOAL #1   Title Patient will improve FOTO score by at least 5 points in order to indicate improved tolerance to activity.    Baseline 58.2% function    Time 4    Period Weeks    Status On-going      PT LONG TERM GOAL #2   Title Demo trunk flexion 3+/5 to improve stability and reduce back pain    Baseline 2+/5    Time 4    Period Weeks    Status On-going      PT LONG TERM GOAL #3   Title PT to be able to single leg stance on both legs for at least 30" to reduce risk of falls    Baseline 2-3 sec    Time 4  Period Weeks    Status On-going                   Plan - 06/21/21 0929     Clinical Impression Statement Tolerating tx sesssion well. Notes some symptom improvement with mechanical traction with relief lasting 1-2 days with him performing his typical activities at home.  Continues to experience LE parasthesias with prolonged walking/standing. Continue x 1  visit for D/C assessment    Personal Factors and Comorbidities Age;Time since onset of injury/illness/exacerbation;Past/Current Experience    Examination-Activity Limitations Lift;Stand;Stairs;Squat;Reach Overhead;Locomotion Level    Examination-Participation Restrictions Cleaning;Community Activity;Yard Work    Stability/Clinical Decision Making Stable/Uncomplicated    Rehab Potential Fair    PT Frequency 2x / week    PT Duration 4 weeks    PT Treatment/Interventions ADLs/Self Care Home Management;Electrical Stimulation;Traction;Gait training;Stair training;Functional mobility training;Therapeutic activities;Therapeutic exercise;Balance training;Manual techniques;Dry needling;Joint Manipulations;Spinal Manipulations    PT Next Visit Plan supine/sidelying hip and core strengthening progressions as tolerated. Assess response to mechanical traction    PT Home Exercise Plan LTR, SKTC, DKTC, hip flex iso, seated forward flexion 10/24 hip abd, open book    Consulted and Agree with Plan of Care Patient             Patient will benefit from skilled therapeutic intervention in order to improve the following deficits and impairments:  Difficulty walking, Decreased activity tolerance, Decreased balance, Decreased strength, Postural dysfunction, Improper body mechanics, Pain  Visit Diagnosis: Difficulty in walking, not elsewhere classified  Muscle weakness (generalized)  Other symptoms and signs involving the musculoskeletal system  Radiculopathy, lumbar region  Unsteadiness on feet     Problem List Patient Active Problem List   Diagnosis Date Noted   S/P left knee arthroscopy 07/15/19 07/31/2019   Lateral meniscus derangement, left    Derangement of posterior horn of medial meniscus of left knee    OSA (obstructive sleep apnea) 09/02/2018   Obesity (BMI 30-39.9) 09/02/2018   Aftercare following surgery of the circulatory system, NEC 04/15/2014   Occlusion and stenosis of carotid  artery without mention of cerebral infarction 03/20/2012   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 [place], [place], [address] Phone: [phone]   Fax:  [phone]  Name: [name] [name] MRN: [id number] Date of Birth: Mar 25, 1941

## 2021-06-23 ENCOUNTER — Encounter (HOSPITAL_COMMUNITY): Payer: Medicare HMO

## 2021-06-28 ENCOUNTER — Other Ambulatory Visit: Payer: Self-pay

## 2021-06-28 ENCOUNTER — Ambulatory Visit (HOSPITAL_COMMUNITY): Payer: Medicare HMO

## 2021-06-28 DIAGNOSIS — R262 Difficulty in walking, not elsewhere classified: Secondary | ICD-10-CM

## 2021-06-28 DIAGNOSIS — R2681 Unsteadiness on feet: Secondary | ICD-10-CM | POA: Diagnosis not present

## 2021-06-28 DIAGNOSIS — M6281 Muscle weakness (generalized): Secondary | ICD-10-CM

## 2021-06-28 DIAGNOSIS — R29898 Other symptoms and signs involving the musculoskeletal system: Secondary | ICD-10-CM | POA: Diagnosis not present

## 2021-06-28 DIAGNOSIS — M5416 Radiculopathy, lumbar region: Secondary | ICD-10-CM

## 2021-06-28 NOTE — Therapy (Signed)
[place], [place], [address] Phone: [phone]   Fax:  [phone]  Physical Therapy Treatment and Discharge Summary  Patient Details  Name: [name] [name] MRN: [id number] Date of Birth: 09/08/1940 Referring Provider (PT): Carole Civil, MD  PHYSICAL THERAPY DISCHARGE SUMMARY  Visits from Start of Care: 8  Current functional level related to goals / functional outcomes: See below   Remaining deficits: Continued symptoms of BLE neurogenic claudication   Education / Equipment: HEP   Patient agrees to discharge. Patient goals were partially met. Patient is being discharged due to did not respond to therapy.    Encounter Date: 06/28/2021   PT End of Session - 06/28/21 0814     Visit Number 8    Number of Visits 8    Date for PT Re-Evaluation 06/28/21    Authorization Type [employer] Medicare HMO, auth [employer], VL med. necessity    PT Start Time 0815    PT Stop Time 0900    PT Time Calculation (min) 45 min    Activity Tolerance Patient tolerated treatment well    Behavior During Therapy WFL for tasks assessed/performed             Past Medical History:  Diagnosis Date   Arthritis    CAD (coronary artery disease)    CAD (coronary artery disease) 12/04/2011   Left Main Disease with 3- vessel CAD    CAD (coronary artery disease)    seeing Dr Harl Bowie   CHF (congestive heart failure) ([place])    Deviated septum    Dyslipidemia    GERD (gastroesophageal reflux disease)    Heart attack ([place])    Hyperlipidemia    Hypertension    Hypothyroidism    MI (myocardial infarction) ([place]) 1997   Neuropathy    Obesity (BMI 30-39.9) 09/02/2018   PONV (postoperative nausea and vomiting)    has in past, no problems after most recent surgery   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   LIMA to LAD, SVG to OM2, SVG to PDA, EVH via right thigh   S/P carotid endarterectomy 02/29/2012   Right CEA for asymptomatic severe right ICA  stenosis    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   Left Ear Rim (Cx3,5FU)   Shortness of breath    with exertion   Sleep apnea    CPAP, sleep study at [facility]   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   80-99% stenosis RICA, asymptomatic   Thyroid nodule    Wears glasses    or contacts    Past Surgical History:  Procedure Laterality Date   ADENOIDECTOMY     CARDIAC CATHETERIZATION     02/22/12   CARDIOVASCULAR STRESS TEST  7/13   CAROTID ENDARTERECTOMY     COLONOSCOPY  15-20years and 12/04/11   Done at [place]  12/11/2011   Procedure: COLONOSCOPY;  Surgeon: Rogene Houston, MD;  [facility];  Service: Endoscopy;  Laterality: N/A;  830   CORONARY ARTERY BYPASS GRAFT  02/29/2012   Procedure: CORONARY ARTERY BYPASS GRAFTING (CABG);  Surgeon: Rexene Alberts, MD;  Location: [place];  Service: Open Heart Surgery;  Laterality: N/A;  times three using Left Internal Mammary Artery and Right Greater Saphenous Vein Graft Harvested Endoscopically   CORONARY STENT PLACEMENT     ENDARTERECTOMY  02/29/2012   Procedure: ENDARTERECTOMY CAROTID;  Surgeon: Angelia Mould, MD;  Location: [place];  Service: Vascular;  Laterality: Right;   FOOT SURGERY  06/30/11   artificial joint in big toe right foot    KNEE ARTHROSCOPY WITH MEDIAL MENISECTOMY Left 07/15/2019   Procedure: KNEE ARTHROSCOPY WITH MEDIAL MENISCECTOMY AND LATERAL MENISCECTOMY;  Surgeon: Carole Civil, MD;  Location: [facility];  Service: Orthopedics;  Laterality: Left;   LEFT HEART CATHETERIZATION WITH CORONARY ANGIOGRAM N/A 02/20/2012   Procedure: LEFT HEART CATHETERIZATION WITH CORONARY ANGIOGRAM;  Surgeon: Troy Sine, MD;  Location: [facility] CATH LAB;  Service: Cardiovascular;  Laterality: N/A;   NASAL SEPTUM SURGERY     Totol thyroidectomy  02/06/2011   TRANSESOPHAGEAL ECHOCARDIOGRAM     UPPER GASTROINTESTINAL ENDOSCOPY  35-40 years ago   [facility]    There were no vitals filed for this  visit.   Subjective Assessment - 06/28/21 0816     Subjective Not much change in symptoms in LE when walking. Burning sensation in BLE when walking or standing for period of time    Currently in Pain? Yes    Pain Score 5     Pain Location Leg    Pain Orientation Right;Left    Pain Descriptors / Indicators Burning    Pain Type Chronic pain    Pain Radiating Towards knees to feet equally    Pain Onset More than a month ago                [facility] PT Assessment - 06/28/21 0001       Assessment   Medical Diagnosis Spinal stenosis of lumbar region with neurogenic claudication    Referring Provider (PT) Carole Civil, MD      Observation/Other Assessments   Focus on Therapeutic Outcomes (FOTO)  58.2% function      Strength   Lumbar Flexion 3/5    Lumbar Extension 2+/5                           [facility] Adult PT Treatment/Exercise - 06/28/21 0001       Ambulation/Gait   Ambulation/Gait Yes    Ambulation/Gait Assistance 7: Independent    Gait Comments gait assessment walking at self-selected speed x 10 min level ground with onset of imbalance towards end of session due to fatigue and onset of BLE symptoms      Lumbar Exercises: Stretches   Single Knee to Chest Stretch Right;Left;3 reps;30 seconds    Double Knee to Chest Stretch 2 reps;60 seconds      Lumbar Exercises: Supine   Ab Set 10 reps    Bent Knee Raise 10 reps    Isometric Hip Flexion 10 reps;2 seconds                       PT Short Term Goals - 06/28/21 0109       PT SHORT TERM GOAL #1   Title PT to be I in HEP to allow decreased nerve irritation and improve activity tolerance    Baseline Independent    Time 2    Period Weeks    Status Achieved    Target Date 06/14/21      PT SHORT TERM GOAL #2   Title PT to be abe able to walk for 10 minutes without having to rest.    Baseline level ground x 10 min with minimal discomfort    Time 2    Period Weeks    Status  Achieved    Target Date 06/14/21  PT SHORT TERM GOAL #3   Title Demo trunk strength 3/5 flexion to improve stability    Baseline 3/5 flexion strength    Time 2    Period Weeks    Status Achieved    Target Date 06/14/21               PT Long Term Goals - 06/28/21 0824       PT LONG TERM GOAL #1   Title Patient will improve FOTO score by at least 5 points in order to indicate improved tolerance to activity.    Baseline 58.2% function at [facility] and end of care    Time 4    Period Weeks    Status Not Met      PT LONG TERM GOAL #2   Title Demo trunk flexion 3+/5 to improve stability and reduce back pain    Baseline 3/5 currently    Time 4    Period Weeks    Status Not Met      PT LONG TERM GOAL #3   Title PT to be able to single leg stance on both legs for at least 30" to reduce risk of falls    Baseline 6-7 sec    Time 4    Period Weeks    Status Not Met                   Plan - 06/28/21 0859     Clinical Impression Statement Unfortunately not much change in symptoms in BLE with walking and standing. Demonstrates some improvement in abdominal strength/stabilization and increased walking distance/time without manifestation of LE symptoms but no change noted in [employer]  Pt to D/C to care of MD for further assessment/intervention    Personal Factors and Comorbidities Age;Time since onset of injury/illness/exacerbation;Past/Current Experience    Examination-Activity Limitations Lift;Stand;Stairs;Squat;Reach Overhead;Locomotion Level    Examination-Participation Restrictions Cleaning;Community Activity;Yard Work    Stability/Clinical Decision Making Stable/Uncomplicated    Rehab Potential Fair    PT Frequency 2x / week    PT Duration 4 weeks    PT Treatment/Interventions ADLs/Self Care Home Management;Electrical Stimulation;Traction;Gait training;Stair training;Functional mobility training;Therapeutic activities;Therapeutic exercise;Balance training;Manual  techniques;Dry needling;Joint Manipulations;Spinal Manipulations    PT Next Visit Plan supine/sidelying hip and core strengthening progressions as tolerated. Assess response to mechanical traction    PT Home Exercise Plan LTR, SKTC, DKTC, hip flex iso, seated forward flexion 10/24 hip abd, open book    Consulted and Agree with Plan of Care Patient             Patient will benefit from skilled therapeutic intervention in order to improve the following deficits and impairments:  Difficulty walking, Decreased activity tolerance, Decreased balance, Decreased strength, Postural dysfunction, Improper body mechanics, Pain  Visit Diagnosis: Difficulty in walking, not elsewhere classified  Muscle weakness (generalized)  Other symptoms and signs involving the musculoskeletal system  Radiculopathy, lumbar region  Unsteadiness on feet     Problem List Patient Active Problem List   Diagnosis Date Noted   S/P left knee arthroscopy 07/15/19 07/31/2019   Lateral meniscus derangement, left    Derangement of posterior horn of medial meniscus of left knee    OSA (obstructive sleep apnea) 09/02/2018   Obesity (BMI 30-39.9) 09/02/2018   Aftercare following surgery of the circulatory system, NEC 04/15/2014   Occlusion and stenosis of carotid artery without mention of cerebral infarction 03/20/2012   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 [place], [place], [address] Phone: [phone]   Fax:  [phone]  Name:  [name] [name] MRN: [id number] Date of Birth: Mar 12, 1941

## 2021-06-30 ENCOUNTER — Ambulatory Visit (HOSPITAL_COMMUNITY): Payer: Medicare HMO

## 2021-07-05 ENCOUNTER — Encounter (HOSPITAL_COMMUNITY): Payer: Medicare HMO

## 2021-07-06 ENCOUNTER — Encounter (HOSPITAL_COMMUNITY): Payer: Medicare HMO | Admitting: Physical Therapy

## 2021-07-11 DIAGNOSIS — R21 Rash and other nonspecific skin eruption: Secondary | ICD-10-CM | POA: Diagnosis not present

## 2021-07-19 DIAGNOSIS — R21 Rash and other nonspecific skin eruption: Secondary | ICD-10-CM | POA: Diagnosis not present

## 2021-08-03 ENCOUNTER — Institutional Professional Consult (permissible substitution): Payer: Medicare HMO | Admitting: Pulmonary Disease

## 2021-08-12 DIAGNOSIS — I10 Essential (primary) hypertension: Secondary | ICD-10-CM | POA: Diagnosis not present

## 2021-08-12 DIAGNOSIS — E782 Mixed hyperlipidemia: Secondary | ICD-10-CM | POA: Diagnosis not present

## 2021-08-16 ENCOUNTER — Encounter: Payer: Self-pay | Admitting: Physician Assistant

## 2021-08-16 ENCOUNTER — Ambulatory Visit (INDEPENDENT_AMBULATORY_CARE_PROVIDER_SITE_OTHER): Payer: Medicare HMO | Admitting: Physician Assistant

## 2021-08-16 ENCOUNTER — Other Ambulatory Visit: Payer: Self-pay

## 2021-08-16 DIAGNOSIS — E118 Type 2 diabetes mellitus with unspecified complications: Secondary | ICD-10-CM | POA: Diagnosis not present

## 2021-08-16 DIAGNOSIS — E782 Mixed hyperlipidemia: Secondary | ICD-10-CM | POA: Diagnosis not present

## 2021-08-16 DIAGNOSIS — B354 Tinea corporis: Secondary | ICD-10-CM | POA: Diagnosis not present

## 2021-08-16 DIAGNOSIS — E039 Hypothyroidism, unspecified: Secondary | ICD-10-CM | POA: Diagnosis not present

## 2021-08-16 LAB — POCT SKIN KOH: Skin KOH, POC: POSITIVE — AB

## 2021-08-16 MED ORDER — KETOCONAZOLE 2 % EX CREA: 1.0000 "application " | TOPICAL_CREAM | Freq: Two times a day (BID) | CUTANEOUS | 3 refills | Status: AC

## 2021-08-17 NOTE — Progress Notes (Signed)
° °  Follow-Up Visit   Subjective  [name] [name] is a 81 y.o. male who presents for the following: Rash (Pt states he has had a rash on the back x2-3 mo, itches and burns. Has been to pcp twice in the last 2 mo. Pt was instructed to used otc selsum blue and it subsequently spread to the buttocks. Pt was then given trazadone 50mg , TAC 0.1% cr and Xyzal. Pt states they have not helped much and areas still itch and burn ).    The following portions of the chart were reviewed this encounter and updated as appropriate:  Tobacco   Allergies   Meds   Problems   Med Hx   Surg Hx   Fam Hx       Objective  Well appearing patient in no apparent distress; mood and affect are within normal limits.  A focused examination was performed including waist up, buttocks, legs and feet.. Relevant physical exam findings are noted in the Assessment and Plan.  Buttocks, Torso - Posterior (Back) Large patches of xerosis on a pink base with an advancing border.  Toenails are yellow and thick.   Assessment & Plan  Tinea corporis (2) Torso - Posterior (Back); Buttocks  ketoconazole (NIZORAL) 2 % cream - Buttocks, Torso - Posterior (Back) Apply 1 application topically 2 (two) times daily.  POCT Skin KOH - Buttocks, Torso - Posterior (Back)    I, Josslyn Ciolek, PA-C, have reviewed all documentation's for this visit.  The documentation on 08/17/21 for the exam, diagnosis, procedures and orders are all accurate and complete.

## 2021-08-19 ENCOUNTER — Encounter: Payer: Self-pay | Admitting: Pulmonary Disease

## 2021-08-19 ENCOUNTER — Ambulatory Visit (INDEPENDENT_AMBULATORY_CARE_PROVIDER_SITE_OTHER): Payer: Medicare HMO | Admitting: Pulmonary Disease

## 2021-08-19 ENCOUNTER — Other Ambulatory Visit: Payer: Self-pay

## 2021-08-19 VITALS — BP 134/78 | HR 71 | Temp 98.4°F | Ht 73.0 in | Wt 209.0 lb

## 2021-08-19 DIAGNOSIS — N182 Chronic kidney disease, stage 2 (mild): Secondary | ICD-10-CM | POA: Diagnosis not present

## 2021-08-19 DIAGNOSIS — E782 Mixed hyperlipidemia: Secondary | ICD-10-CM | POA: Diagnosis not present

## 2021-08-19 DIAGNOSIS — G4733 Obstructive sleep apnea (adult) (pediatric): Secondary | ICD-10-CM

## 2021-08-19 DIAGNOSIS — G4700 Insomnia, unspecified: Secondary | ICD-10-CM | POA: Diagnosis not present

## 2021-08-19 DIAGNOSIS — F5101 Primary insomnia: Secondary | ICD-10-CM | POA: Diagnosis not present

## 2021-08-19 DIAGNOSIS — E875 Hyperkalemia: Secondary | ICD-10-CM | POA: Diagnosis not present

## 2021-08-19 DIAGNOSIS — I129 Hypertensive chronic kidney disease with stage 1 through stage 4 chronic kidney disease, or unspecified chronic kidney disease: Secondary | ICD-10-CM | POA: Diagnosis not present

## 2021-08-19 DIAGNOSIS — E118 Type 2 diabetes mellitus with unspecified complications: Secondary | ICD-10-CM | POA: Diagnosis not present

## 2021-08-19 DIAGNOSIS — I2510 Atherosclerotic heart disease of native coronary artery without angina pectoris: Secondary | ICD-10-CM | POA: Diagnosis not present

## 2021-08-19 DIAGNOSIS — Z23 Encounter for immunization: Secondary | ICD-10-CM

## 2021-08-19 DIAGNOSIS — E039 Hypothyroidism, unspecified: Secondary | ICD-10-CM | POA: Diagnosis not present

## 2021-08-19 MED ORDER — BELSOMRA 10 MG PO TABS: 1.0000 | ORAL_TABLET | Freq: Every day | ORAL | 3 refills | Status: DC

## 2021-08-19 NOTE — Addendum Note (Signed)
Addended by: Fritzi Mandes D on: 08/19/2021 12:25 PM   Modules accepted: Orders

## 2021-08-19 NOTE — Progress Notes (Signed)
[facility]  Chief Complaint  Patient presents with   Consult    Ref by Dr. Harl Bowie for OSA. Currently using CPAP machine. Pt here to establish pulmonary care in [place]     Past Surgical History:  He  has a past surgical history that includes Coronary stent placement; Nasal septum surgery; Totol thyroidectomy (02/06/2011); Foot surgery (06/30/11); Colonoscopy (15-20years and 12/04/11); Upper gastrointestinal endoscopy (35-40 years ago); Colonoscopy (12/11/2011); Cardiac catheterization; Adenoidectomy; Cardiovascular stress test (7/13); transesophageal echocardiogram; Coronary artery bypass graft (02/29/2012); Endarterectomy (02/29/2012); Carotid endarterectomy; left heart catheterization with coronary angiogram (N/A, 02/20/2012); and Knee arthroscopy with medial menisectomy (Left, 07/15/2019).  Past Medical History:  OA, CAD, CHF, HLD, GERD, HTN, Hypothyroidism, Lumbar radiculopathy with b/l leg parasthesias, Carpal tunnel syndrome  Constitutional:  BP 134/78 (BP Location: Left Arm, Patient Position: Sitting)    Pulse 71    Temp 98.4 F (36.9 C) (Temporal)    Ht 6\' 1"  (1.854 m)    Wt 209 lb (94.8 kg)    SpO2 97% Comment: ra   BMI 27.57 kg/m   Brief Summary:  [name] [name] is a 81 y.o. male former smoker with obstructive sleep apnea.       Subjective:   He was diagnosed with sleep apnea years ago.  He got his newest CPAP machine in 2020.  Uses [employer] for his DME.  Pressure setting is good.  He has full face mask.  Tried nasal pillow, but wasn't comfortable.  He thinks he is a mouth breather.  He goes to sleep at 9 pm.  He falls asleep after 30 minutes.  He doesn't wake up to use the bathroom.  He gets out of bed at 6 am.  He usually wakes up early than this, but then can't go back to sleep and so just lays in bed.  He feels okay in the morning.  He denies morning headache.  He does not use anything to help him stay awake.  He has been using [place]  for years.  Doesn't feel like it is working as well as before.  He feels he needs at least 6 hours sleep per day.  He gets sleepy in the evening and then falls asleep for a brief period while watching TV.  He denies sleep walking, sleep talking, bruxism, or nightmares.  There is no history of restless legs.  He denies sleep hallucinations, sleep paralysis, or cataplexy.  The Epworth score is 6 out of 24.   Physical Exam:   Appearance - well kempt   ENMT - no sinus tenderness, no oral exudate, no LAN, Mallampati 4 airway, no stridor  Respiratory - equal breath sounds bilaterally, no wheezing or rales  CV - s1s2 regular rate and rhythm, no murmurs  Ext - no clubbing, no edema  Skin - no rashes  Psych - normal mood and affect   Pulmonary testing:  Spirometry 02/27/12 >>  FEV1 3.11 (84%), FEV1% 75  Sleep Tests:  PSG 08/22/12 >> AHI 8.3, SpO2 low 88% CPAP titration 10/01/12 >> CPAP 12 cm H2O PSG 12/06/14 >> AHI 3.7, SpO2 low 86% CPAP titration 09/24/18 >> CPAP 10 cm H2O CPAP 07/20/21 to 08/18/21 >> used on 30 of 30 nights with average 9 hrs 18 min.  Average AHI 1 with CPAP 10 cm H2O  Cardiac Tests:  Echo 03/26/13 >> EF 55 to 60%, grade 1 DD, mild MR, mild LA dilation  Social History:  He  reports that he  quit smoking about 19 years ago. His smoking use included cigarettes. He has a 7.50 pack-year smoking history. He has never used smokeless tobacco. He reports that he does not currently use alcohol after a past usage of about 3.0 - 4.0 standard drinks per week. He reports that he does not use drugs.  Family History:  His family history includes Healthy in his brother, sister, and son; Heart attack in his father; Heart disease in his father; Hyperlipidemia in his father; Hypertension in his father and mother; Prostate cancer in his father.     Assessment/Plan:   Obstructive sleep apnea. - he is compliant with CPAP and reports benefit from therapy - he uses [employer] for his DME;  explained the difficulties that can happen when trying to switch DMEs - advised him to contact his insurance providers to determine what coverage he has for supplies, and what he is responsible for  - continue CPAP 10 cm H2O - discussed different mask options - discussed different treatment options, including Inspire device - reviewed his sleep study results - discussed how untreated sleep apnea can impact his function and his health  Insomnia. - discussed sleep restriction, stimulus control and relaxation techniques - continue ambien 10 mg qhs; he has been on this for years and explained it would be a very slow process to gradually wean him off this once his sleep status is improved - will add belsomra 10 mg qhs; he can increase to 20 mg as needed in 4 weeks if he is still having trouble falling sleep and staying asleep  Vaccinations. - PCV 20 and influenza vaccines given today  Time Spent Involved in Patient Care on Day of Examination:  48 minutes  Follow up:   Patient Instructions  Influenza and Pneumonia 20 vaccines today.  Set your sleep cycle to go to bed at midnight and wake up at 6 am, and don't sleep outside this time period.  Belsomra 10 mg pill 30 minutes before bedtime.  You can double dose after 4 weeks if you are still having trouble with your sleep.  Follow up in 3 months  Medication List:   Allergies as of 08/19/2021   No Known Allergies      Medication List        Accurate as of August 19, 2021 11:57 AM. If you have any questions, ask your nurse or doctor.          amLODipine 5 MG tablet Commonly known as: Norvasc Take 1 tablet (5 mg total) by mouth daily.   aspirin EC 81 MG tablet Take 81 mg by mouth daily.   atorvastatin 80 MG tablet Commonly known as: LIPITOR Take 1 tablet (80 mg total) by mouth daily.   Belsomra 10 MG Tabs Generic drug: Suvorexant Take 1 tablet by mouth at bedtime. Started by: Chesley Mires, MD   diclofenac 75 MG EC  tablet Commonly known as: VOLTAREN TAKE (1) TABLET BY MOUTH TWICE DAILY.   DULoxetine 30 MG capsule Commonly known as: Cymbalta Take 1 capsule (30 mg total) by mouth daily.   Fish Oil 1000 MG Cpdr Take 1,000 mg by mouth 2 (two) times daily.   fluorouracil 5 % cream Commonly known as: EFUDEX Apply topically at bedtime.   ketoconazole 2 % cream Commonly known as: NIZORAL Apply 1 application topically 2 (two) times daily.   metoprolol tartrate 50 MG tablet Commonly known as: LOPRESSOR Take 1 tablet (50 mg total) by mouth 2 (two) times daily.   NEXIUM  PO Take 20 mg by mouth daily.   PRESERVISION AREDS 2 PO Take 1 tablet by mouth 2 (two) times daily.   promethazine 12.5 MG tablet Commonly known as: PHENERGAN Take 1 tablet (12.5 mg total) by mouth every 6 (six) hours as needed for nausea or vomiting.   ramipril 10 MG capsule Commonly known as: ALTACE Take 10 mg by mouth daily.   Synthroid 112 MCG tablet Generic drug: levothyroxine Take 112 mcg by mouth daily before breakfast.   zolpidem 10 MG tablet Commonly known as: AMBIEN Take 10 mg by mouth at bedtime as needed for sleep.        Signature:  Chesley Mires, MD [place] Pager - [phone] 08/19/2021, 11:57 AM

## 2021-08-19 NOTE — Patient Instructions (Signed)
Influenza and Pneumonia 20 vaccines today.  Set your sleep cycle to go to bed at midnight and wake up at 6 am, and don't sleep outside this time period.  Belsomra 10 mg pill 30 minutes before bedtime.  You can double dose after 4 weeks if you are still having trouble with your sleep.  Follow up in 3 months

## 2021-08-23 ENCOUNTER — Telehealth: Payer: Self-pay | Admitting: Cardiology

## 2021-08-23 MED ORDER — ATORVASTATIN CALCIUM 80 MG PO TABS: 80.0000 mg | ORAL_TABLET | Freq: Every day | ORAL | 3 refills | Status: DC

## 2021-08-23 NOTE — Telephone Encounter (Signed)
Completed.

## 2021-08-23 NOTE — Telephone Encounter (Signed)
Needing Rx for Atorvastatin 80mg  sent to [facility], Mail order

## 2021-08-24 ENCOUNTER — Telehealth: Payer: Self-pay | Admitting: Pulmonary Disease

## 2021-08-24 NOTE — Telephone Encounter (Signed)
Pt states Belsomra isn't working.   Patient has been trying belsomra since 08-19-2021 but would like to try a different medication.    Dr. Halford Chessman please advise.

## 2021-08-24 NOTE — Telephone Encounter (Signed)
Please have him try increasing the dose from 10 to 20 mg nightly, and do this for 1 week.  If he is still having trouble with his sleep, then he can message back and we can try switching to something different.

## 2021-08-25 NOTE — Telephone Encounter (Signed)
Called and spoke to patient. He voiced understanding of recommendations and will call back in one week and let us know if the 20 mg is working for him or not.

## 2021-08-31 ENCOUNTER — Telehealth: Payer: Self-pay | Admitting: Pulmonary Disease

## 2021-08-31 MED ORDER — DOXEPIN HCL 3 MG PO TABS: 1.0000 | ORAL_TABLET | Freq: Every day | ORAL | 3 refills | Status: DC

## 2021-08-31 NOTE — Telephone Encounter (Signed)
Pt states 20 mg of Belsomra isn't working as well.  Dr. Halford Chessman please advise

## 2021-08-31 NOTE — Telephone Encounter (Signed)
Please have him continue belsomra 20 mg nightly and ambien 10 mg nightly for now.  Will add doxepin 3 mg nightly - I have send this prescription.  Please also schedule follow up visit with me in [place] office the week of February 27.  Okay to double book visit.

## 2021-09-01 ENCOUNTER — Other Ambulatory Visit (HOSPITAL_COMMUNITY): Payer: Self-pay

## 2021-09-01 ENCOUNTER — Telehealth: Payer: Self-pay | Admitting: Pharmacy Technician

## 2021-09-01 NOTE — Telephone Encounter (Signed)
Patient Advocate Encounter  Prior Authorization for Doxepin 3mg  has been approved.    PA#  PA Case ID: [id number] Effective dates: 08/14/21 through 08/13/22  Per Test Claim Patients co-pay is $95.   Spoke with Pharmacy to Process.  Patient Advocate Fax:  [phone]

## 2021-09-01 NOTE — Telephone Encounter (Signed)
Patient Advocate Encounter   Received notification from CoverMyMeds that prior authorization for Doxepin 3mg  is required by his/her insurance [employer].  Per Test Claim: Non-formulary   PA submitted on 09/01/21 Key [id number] Status is pending    [facility] will continue to follow:  Patient Advocate Fax:  [phone]

## 2021-09-01 NOTE — Telephone Encounter (Signed)
ATC patient to notify of copay cost. Left detailed message on cell phone VM (ok per dpr), advised pt to return call to discuss.

## 2021-09-01 NOTE — Telephone Encounter (Signed)
Called and spoke to patient. Informed him to take belsomra 20 mg + ambien 10 mg + doxepin 3 mg. He voiced understanding. Made patient an appt in [place] office for 10/12/21 at 12:00 doublebooked. Will mail pt an appt reminder. Nothing further needed.  Routing to Dr. Halford Chessman to make him aware of appt scheduled.

## 2021-09-13 DIAGNOSIS — I10 Essential (primary) hypertension: Secondary | ICD-10-CM | POA: Diagnosis not present

## 2021-09-13 DIAGNOSIS — E782 Mixed hyperlipidemia: Secondary | ICD-10-CM | POA: Diagnosis not present

## 2021-10-06 DIAGNOSIS — H04123 Dry eye syndrome of bilateral lacrimal glands: Secondary | ICD-10-CM | POA: Diagnosis not present

## 2021-10-06 DIAGNOSIS — H35373 Puckering of macula, bilateral: Secondary | ICD-10-CM | POA: Diagnosis not present

## 2021-10-06 DIAGNOSIS — H524 Presbyopia: Secondary | ICD-10-CM | POA: Diagnosis not present

## 2021-10-06 DIAGNOSIS — H353132 Nonexudative age-related macular degeneration, bilateral, intermediate dry stage: Secondary | ICD-10-CM | POA: Diagnosis not present

## 2021-10-06 DIAGNOSIS — H40013 Open angle with borderline findings, low risk, bilateral: Secondary | ICD-10-CM | POA: Diagnosis not present

## 2021-10-11 DIAGNOSIS — K649 Unspecified hemorrhoids: Secondary | ICD-10-CM | POA: Diagnosis not present

## 2021-10-12 ENCOUNTER — Other Ambulatory Visit: Payer: Self-pay

## 2021-10-12 ENCOUNTER — Ambulatory Visit (INDEPENDENT_AMBULATORY_CARE_PROVIDER_SITE_OTHER): Payer: Medicare HMO | Admitting: Pulmonary Disease

## 2021-10-12 ENCOUNTER — Encounter: Payer: Self-pay | Admitting: Pulmonary Disease

## 2021-10-12 VITALS — BP 134/76 | HR 74 | Temp 98.2°F | Ht 73.0 in | Wt 204.6 lb

## 2021-10-12 DIAGNOSIS — G4733 Obstructive sleep apnea (adult) (pediatric): Secondary | ICD-10-CM

## 2021-10-12 DIAGNOSIS — F5101 Primary insomnia: Secondary | ICD-10-CM | POA: Diagnosis not present

## 2021-10-12 MED ORDER — ZOLPIDEM TARTRATE ER 12.5 MG PO TBCR: 12.5000 mg | EXTENDED_RELEASE_TABLET | Freq: Every evening | ORAL | 5 refills | Status: DC | PRN

## 2021-10-12 NOTE — Progress Notes (Signed)
? ?[facility] ? ?Chief Complaint  ?Patient presents with  ? Follow-up  ?  Not taking belsamra, ambien, or doxepin. Still struggling with insomnia   ? ? ?Past Surgical History:  ?He  has a past surgical history that includes Coronary stent placement; Nasal septum surgery; Totol thyroidectomy (02/06/2011); Foot surgery (06/30/11); Colonoscopy (15-20years and 12/04/11); Upper gastrointestinal endoscopy (35-40 years ago); Colonoscopy (12/11/2011); Cardiac catheterization; Adenoidectomy; Cardiovascular stress test (7/13); transesophageal echocardiogram; Coronary artery bypass graft (02/29/2012); Endarterectomy (02/29/2012); Carotid endarterectomy; left heart catheterization with coronary angiogram (N/A, 02/20/2012); and Knee arthroscopy with medial menisectomy (Left, 07/15/2019). ? ?Past Medical History:  ?OA, CAD, CHF, HLD, GERD, HTN, Hypothyroidism, Lumbar radiculopathy with b/l leg parasthesias, Carpal tunnel syndrome ? ?Constitutional:  ?BP 134/76 (BP Location: Left Arm, Patient Position: Sitting)   Pulse 74   Temp 98.2 ?F (36.8 ?C) (Temporal)   Ht 6\' 1"  (1.854 m)   Wt 204 lb 9.6 oz (92.8 kg)   SpO2 98% Comment: ra  BMI 26.99 kg/m?  ? ?Brief Summary:  ?[name] [name] is a 81 y.o. male former smoker with obstructive sleep apnea and insomnia. ?  ? ? ? ?Subjective:  ? ?He is using CPAP nightly.  No issues with mask fit or pressure setting. ? ?He goes to bed at 9 pm.  He can fall asleep quickly.  He gets about 5 or 6 hours of sleep and then wakes up.  He would prefer to get out of bed at 7 am, but has trouble falling back to sleep after he wakes up at 2 or 3 in the morning.  He feels fine during the day.  He tried belsomra and doxepin.  These didn't make any difference.  Doxepin was also cost prohibitive.  He doesn't feel like he is depressed or under too much stress. ? ?Physical Exam:  ? ?Appearance - well kempt  ? ?ENMT - no sinus tenderness, no oral exudate, no LAN,  Mallampati 4 airway, no stridor ? ?Respiratory - equal breath sounds bilaterally, no wheezing or rales ? ?CV - s1s2 regular rate and rhythm, no murmurs ? ?Ext - no clubbing, no edema ? ?Skin - no rashes ? ?Psych - normal mood and affect ?  ?Pulmonary testing:  ?Spirometry 02/27/12 >>  FEV1 3.11 (84%), FEV1% 75 ? ?Sleep Tests:  ?PSG 08/22/12 >> AHI 8.3, SpO2 low 88% ?CPAP titration 10/01/12 >> CPAP 12 cm H2O ?PSG 12/06/14 >> AHI 3.7, SpO2 low 86% ?CPAP titration 09/24/18 >> CPAP 10 cm H2O ?CPAP 07/20/21 to 08/18/21 >> used on 30 of 30 nights with average 9 hrs 18 min.  Average AHI 1 with CPAP 10 cm H2O ? ?Cardiac Tests:  ?Echo 03/26/13 >> EF 55 to 60%, grade 1 DD, mild MR, mild LA dilation ? ?Social History:  ?He  reports that he quit smoking about 20 years ago. His smoking use included cigarettes. He has a 7.50 pack-year smoking history. He has never used smokeless tobacco. He reports that he does not currently use alcohol after a past usage of about 3.0 - 4.0 standard drinks per week. He reports that he does not use drugs. ? ?Family History:  ?His family history includes Healthy in his brother, sister, and son; Heart attack in his father; Heart disease in his father; Hyperlipidemia in his father; Hypertension in his father and mother; Prostate cancer in his father. ?  ? ? ?Assessment/Plan:  ? ?Obstructive sleep apnea. ?- he is compliant with CPAP and reports benefit  from therapy ?- he uses [employer] for his DME ?- continue CPAP 10 cm H2O ? ?Insomnia. ?- some of this likely is related to advanced sleep phase ?- discussed techniques to delay his sleep phase ?- discussed setting realistic expectations from sleep ?- discussed sleep restriction, stimulus control and relaxation techniques ?- belsomra and doxepin were ineffective ?- he is having more trouble with sleep maintenance ?- will change him to zolpidem CR 12.5 mg nigthly ? ?Time Spent Involved in Patient Care on Day of Examination:  ?27 minutes ? ?Follow up:  ? ?Patient  Instructions  ?Will change you to zolpidem CR 12.5 mg nightly - this is the extend release form of zolpidem ? ?Follow up in 6 months ? ?Medication List:  ? ?Allergies as of 10/12/2021   ?No Known Allergies ?  ? ?  ?Medication List  ?  ? ?  ? Accurate as of October 12, 2021 12:27 PM. If you have any questions, ask your nurse or doctor.  ?  ?  ? ?  ? ?STOP taking these medications   ? ?Belsomra 10 MG Tabs ?Generic drug: Suvorexant ?Stopped by: Chesley Mires, MD ?  ?Doxepin HCl 3 MG Tabs ?Stopped by: Chesley Mires, MD ?  ?zolpidem 10 MG tablet ?Commonly known as: AMBIEN ?Replaced by: zolpidem 12.5 MG CR tablet ?Stopped by: Chesley Mires, MD ?  ? ?  ? ?TAKE these medications   ? ?amLODipine 5 MG tablet ?Commonly known as: Norvasc ?Take 1 tablet (5 mg total) by mouth daily. ?  ?aspirin EC 81 MG tablet ?Take 81 mg by mouth daily. ?  ?atorvastatin 80 MG tablet ?Commonly known as: LIPITOR ?Take 1 tablet (80 mg total) by mouth daily. ?  ?diclofenac 75 MG EC tablet ?Commonly known as: VOLTAREN ?TAKE (1) TABLET BY MOUTH TWICE DAILY. ?  ?DULoxetine 30 MG capsule ?Commonly known as: Cymbalta ?Take 1 capsule (30 mg total) by mouth daily. ?  ?Fish Oil 1000 MG Cpdr ?Take 1,000 mg by mouth 2 (two) times daily. ?  ?fluorouracil 5 % cream ?Commonly known as: EFUDEX ?Apply topically at bedtime. ?  ?metoprolol tartrate 50 MG tablet ?Commonly known as: LOPRESSOR ?Take 1 tablet (50 mg total) by mouth 2 (two) times daily. ?  ?NEXIUM PO ?Take 20 mg by mouth daily. ?  ?PRESERVISION AREDS 2 PO ?Take 1 tablet by mouth 2 (two) times daily. ?  ?promethazine 12.5 MG tablet ?Commonly known as: PHENERGAN ?Take 1 tablet (12.5 mg total) by mouth every 6 (six) hours as needed for nausea or vomiting. ?  ?ramipril 10 MG capsule ?Commonly known as: ALTACE ?Take 10 mg by mouth daily. ?  ?Synthroid 112 MCG tablet ?Generic drug: levothyroxine ?Take 112 mcg by mouth daily before breakfast. ?  ?zolpidem 12.5 MG CR tablet ?Commonly known as: AMBIEN CR ?Take 1 tablet  (12.5 mg total) by mouth at bedtime as needed for sleep. ?Replaces: zolpidem 10 MG tablet ?Started by: Chesley Mires, MD ?  ? ?  ? ? ?Signature:  ?Chesley Mires, MD ?[place] ?Pager - [phone] ?10/12/2021, 12:27 PM ?  ? ? ? ? ? ? ?  ?

## 2021-10-12 NOTE — Patient Instructions (Signed)
Will change you to zolpidem CR 12.5 mg nightly - this is the extend release form of zolpidem ? ?Follow up in 6 months ?

## 2021-10-25 DIAGNOSIS — K649 Unspecified hemorrhoids: Secondary | ICD-10-CM | POA: Diagnosis not present

## 2021-10-25 DIAGNOSIS — H6122 Impacted cerumen, left ear: Secondary | ICD-10-CM | POA: Diagnosis not present

## 2021-11-01 ENCOUNTER — Encounter (INDEPENDENT_AMBULATORY_CARE_PROVIDER_SITE_OTHER): Payer: Self-pay | Admitting: *Deleted

## 2021-11-03 ENCOUNTER — Telehealth: Payer: Self-pay | Admitting: Pulmonary Disease

## 2021-11-03 ENCOUNTER — Other Ambulatory Visit: Payer: Self-pay

## 2021-11-03 DIAGNOSIS — I6523 Occlusion and stenosis of bilateral carotid arteries: Secondary | ICD-10-CM

## 2021-11-03 NOTE — Telephone Encounter (Signed)
Called Velva Harman (wife) and she states patient took more [place] than he was prescribed a couple of nights ago and he had altered mental status after taking it. Has been taking it for more than 10 years. Patient does not want to come off of ambien but Patients wife feels patient is addicted to medication and wants him to be taken off of it. She states he has 5 pills left.  ? ?Offered an appt for patient and wife to come to office and discuss and she states she cannot do that because she has no transportation and cannot drive.  ? ?Would like Dr. Halford Chessman to call her at [phone] and discuss if possible.   ? ?Dr. Halford Chessman please advise  ?

## 2021-11-03 NOTE — Telephone Encounter (Signed)
Spoke with pt's wife. ? ?She doesn't feel like [name] [name] helps, she is concerned about side effects, and wants to get him off the medicine.  I explained that I agree with her concerns, but that [name] [name] has to be slowly tapered off.  It seems like he issue with ambien CR only occurred when he accidentally took two pills.  I explained that I can not formulate a treatment plan for [name] [name] without his involvement in the discussion.  As such I have requested that we schedule an office visit next week in the [place] office to discuss treatment for his insomnia in more detail.  I asked that she come to the appointment also. ?

## 2021-11-03 NOTE — Telephone Encounter (Signed)
Called patients wife and scheduled appt for 3:00 pm on Thursday 3/30 in [facility] office ok per VS. Nothing further needed at this time ?

## 2021-11-08 ENCOUNTER — Encounter (INDEPENDENT_AMBULATORY_CARE_PROVIDER_SITE_OTHER): Payer: Self-pay | Admitting: Gastroenterology

## 2021-11-08 ENCOUNTER — Ambulatory Visit (INDEPENDENT_AMBULATORY_CARE_PROVIDER_SITE_OTHER): Payer: Medicare HMO | Admitting: Gastroenterology

## 2021-11-08 ENCOUNTER — Other Ambulatory Visit (INDEPENDENT_AMBULATORY_CARE_PROVIDER_SITE_OTHER): Payer: Self-pay

## 2021-11-08 ENCOUNTER — Other Ambulatory Visit: Payer: Self-pay

## 2021-11-08 VITALS — BP 163/81 | HR 41 | Temp 98.2°F | Ht 73.0 in | Wt 210.4 lb

## 2021-11-08 DIAGNOSIS — K5900 Constipation, unspecified: Secondary | ICD-10-CM | POA: Diagnosis not present

## 2021-11-08 DIAGNOSIS — K649 Unspecified hemorrhoids: Secondary | ICD-10-CM | POA: Insufficient documentation

## 2021-11-08 DIAGNOSIS — Z1211 Encounter for screening for malignant neoplasm of colon: Secondary | ICD-10-CM

## 2021-11-08 DIAGNOSIS — R110 Nausea: Secondary | ICD-10-CM

## 2021-11-08 MED ORDER — HYDROCORTISONE (PERIANAL) 2.5 % EX CREA: 1.0000 "application " | TOPICAL_CREAM | Freq: Three times a day (TID) | CUTANEOUS | 1 refills | Status: DC

## 2021-11-08 NOTE — Patient Instructions (Addendum)
We will get you set up for repeat colonoscopy as last one was 10 years ago ?It is important to make sure you are drinking plenty of water, aim for eight 8oz glasses per day. Goal is to have a soft stool atleast every other day and avoid straining as this will worsen hemorrhoids ?Start taking Miralax (over the counter)1 capful every day for one week. If bowel movements do not improve, increase to 1 capful every 12 hours. If after two weeks there is no improvement, increase to 1 capful every 8 hours ?I am sending anusol cream for you to try as well to see if this provides relief of your hemorrhoids ?You can do sitz baths where you sit in a warm bath a few times per day and use tucks pads to wipe up after BMs. ?After colonoscopy we can further discuss hemorrhoid banding if you have larger internal hemorrhoids ?Please let me know if nausea becomes worse or you develop any new symptoms as we may need to do upper endoscopy for further evaluation of this. ? ?Follow up 4 months ?

## 2021-11-08 NOTE — H&P (View-Only) (Signed)
? ?Referring Provider: Celene Squibb, MD ?Primary Care Physician:  Celene Squibb, MD ?Primary GI Physician: new ? ?Chief Complaint  ?Patient presents with  ? Hemorrhoids  ?  Patient here today due to issues with hemorrhoids. He denies any dark or bloody stools. He says he seen Dr. Casimer Leek two weeks ago, and he gave patient a compound from [place] which did not seem to be of any help..  ? ?HPI:   ?[name] [name] is a 81 y.o. male with past medical history of RLS, thyroid disease, HLD, OSA, HTN, CKD, prediabetes,  ? ?Patient presenting today as new patient for hemorrhoids. ? ?He reports that he had rectal exam earlier this month with PCP and was told he had hemorrhoids. Previously using preparation H, PCP sent a topical cream from [employer], however, has not noticed any improvement from this. States that he is having burning, itching and some discomfort in his rectal area after having a BM. Denies any rectal bleeding. Denies any severe rectal pain.  ? ?Endorses some occasional nausea, usually noticed more after eating certain foods. No vomiting. States that he really has only noticed it since having issues with his hemorrhoids. Is having some issues with constipation, BMs are very sporadic. May have one for a few days in a row and then may go 2-3 days without one. Is using magnesium citrate, just a few oz once every few days.  Is not sure if he has tried anything else other than fiber pills for his constipation in the past, states he is drinking 2-3 bottles of water per day. Is taking some fruit/veggies pills x2 months because he was not sure if he was getting enough of these in his diet. Denies any issues with dysphagia or odynophagia. GERD well controlled on Nexium once daily without breakthrough symptoms. Feels that appetite has decreased some over the past few years as he notices he tends to eat a little less than he used to. Denies early satiety or bloating. Denies any weight loss.   ? ?NSAID use: ibuprofen only occasionally  ?Social hx:no etoh or tobacco ?Fam hx: no CRC or liver disease ? ?Last Colonoscopy:4/29/13Examination performed to cecum. ?Two erosions possibly residual from recent bout of colitis. ?Mild sigmoid colon diverticulosis and external hemorrhoids. ?Last Endoscopy:never ? ?Recommendations:  ?Repeat colonoscopy April 2023 ? ?Past Medical History:  ?Diagnosis Date  ? Arthritis   ? CAD (coronary artery disease)   ? CAD (coronary artery disease) 12/04/2011  ? Left Main Disease with 3- vessel CAD   ? CAD (coronary artery disease)   ? seeing Dr Harl Bowie  ? CHF (congestive heart failure) ([place])   ? Deviated septum   ? Dyslipidemia   ? GERD (gastroesophageal reflux disease)   ? Heart attack ([place])   ? Hyperlipidemia   ? Hypertension   ? Hypothyroidism   ? MI (myocardial infarction) ([place]) 1997  ? Neuropathy   ? Obesity (BMI 30-39.9) 09/02/2018  ? PONV (postoperative nausea and vomiting)   ? has in past, no problems after most recent surgery  ? 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 LIMA to LAD, SVG to OM2, SVG to PDA, EVH via right thigh  ? S/P carotid endarterectomy 02/29/2012  ? Right CEA for asymptomatic severe right ICA stenosis   ? 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 Left Ear Rim (Cx3,5FU)  ? Shortness of breath   ? with exertion  ? Sleep apnea   ? CPAP, sleep study at [facility]  ?  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 80-99% stenosis RICA, asymptomatic  ? Thyroid nodule   ? Wears glasses   ? or contacts  ? ? ?Past Surgical History:  ?Procedure Laterality Date  ? ADENOIDECTOMY    ? CARDIAC CATHETERIZATION    ? 02/22/12  ? CARDIOVASCULAR STRESS TEST  7/13  ? CAROTID ENDARTERECTOMY    ? COLONOSCOPY  15-20years and 12/04/11  ? Done at [facility]  ? COLONOSCOPY  12/11/2011  ? Procedure: COLONOSCOPY;  Surgeon: Rogene Houston, MD;  Location: [facility];  Service: Endoscopy;  Laterality: N/A;  830  ? CORONARY ARTERY BYPASS GRAFT  02/29/2012  ? Procedure: CORONARY ARTERY BYPASS GRAFTING  (CABG);  Surgeon: Rexene Alberts, MD;  Location: [place];  Service: Open Heart Surgery;  Laterality: N/A;  times three using Left Internal Mammary Artery and Right Greater Saphenous Vein Graft Harvested Endoscopically  ? CORONARY STENT PLACEMENT    ? ENDARTERECTOMY  02/29/2012  ? Procedure: ENDARTERECTOMY CAROTID;  Surgeon: Angelia Mould, MD;  Location: [place];  Service: Vascular;  Laterality: Right;  ? FOOT SURGERY  06/30/11  ? artificial joint in big toe right foot   ? KNEE ARTHROSCOPY WITH MEDIAL MENISECTOMY Left 07/15/2019  ? Procedure: KNEE ARTHROSCOPY WITH MEDIAL MENISCECTOMY AND LATERAL MENISCECTOMY;  Surgeon: Carole Civil, MD;  Location: [facility];  Service: Orthopedics;  Laterality: Left;  ? LEFT HEART CATHETERIZATION WITH CORONARY ANGIOGRAM N/A 02/20/2012  ? Procedure: LEFT HEART CATHETERIZATION WITH CORONARY ANGIOGRAM;  Surgeon: Troy Sine, MD;  [facility] LAB;  Service: Cardiovascular;  Laterality: N/A;  ? NASAL SEPTUM SURGERY    ? Totol thyroidectomy  02/06/2011  ? TRANSESOPHAGEAL ECHOCARDIOGRAM    ? UPPER GASTROINTESTINAL ENDOSCOPY  35-40 years ago  ? [facility]  ? ? ?Current Outpatient Medications  ?Medication Sig Dispense Refill  ? amLODipine (NORVASC) 5 MG tablet Take 1 tablet (5 mg total) by mouth daily. 90 tablet 2  ? aspirin EC 81 MG tablet Take 81 mg by mouth daily.    ? atorvastatin (LIPITOR) 80 MG tablet Take 1 tablet (80 mg total) by mouth daily. 90 tablet 3  ? diclofenac (VOLTAREN) 75 MG EC tablet TAKE (1) TABLET BY MOUTH TWICE DAILY. 60 tablet 0  ? DULoxetine (CYMBALTA) 30 MG capsule Take 1 capsule (30 mg total) by mouth daily. 30 capsule 3  ? Esomeprazole Magnesium (NEXIUM PO) Take 20 mg by mouth daily.     ? metoprolol (LOPRESSOR) 50 MG tablet Take 1 tablet (50 mg total) by mouth 2 (two) times daily. 180 tablet 1  ? Multiple Vitamins-Minerals (PRESERVISION AREDS 2 PO) Take 1 tablet by mouth 2 (two) times daily.     ? Omega-3 Fatty Acids (FISH OIL) 1000 MG CPDR Take  1,000 mg by mouth 2 (two) times daily.     ? promethazine (PHENERGAN) 12.5 MG tablet Take 1 tablet (12.5 mg total) by mouth every 6 (six) hours as needed for nausea or vomiting. 30 tablet 0  ? ramipril (ALTACE) 10 MG capsule Take 10 mg by mouth daily.    ? SYNTHROID 112 MCG tablet Take 112 mcg by mouth daily before breakfast.     ? zolpidem (AMBIEN) 5 MG tablet Take 10 mg by mouth at bedtime as needed for sleep.    ? zolpidem (AMBIEN CR) 12.5 MG CR tablet Take 1 tablet (12.5 mg total) by mouth at bedtime as needed for sleep. (Patient not taking: Reported on 11/08/2021) 30 tablet  5  ? ?No current facility-administered medications for this visit.  ? ? ?Allergies as of 11/08/2021  ? (No Known Allergies)  ? ? ?Family History  ?Problem Relation Age of Onset  ? Hypertension Mother   ? Healthy Sister   ? Healthy Brother   ? Healthy Son   ? Heart attack Father   ? Heart disease Father   ?     Before age 51  ? Hyperlipidemia Father   ? Hypertension Father   ? Prostate cancer Father   ? Colon cancer Neg Hx   ? ? ?Social History  ? ?Socioeconomic History  ? Marital status: Married  ?  Spouse name: Velva Harman  ? Number of children: 1  ? Years of education: 93  ? Highest education level: Not on file  ?Occupational History  ?  Comment: retired  ?Tobacco Use  ? Smoking status: Former  ?  Packs/day: 0.50  ?  Years: 15.00  ?  Pack years: 7.50  ?  Types: Cigarettes  ?  Quit date: 09/04/2001  ?  Years since quitting: 20.1  ? Smokeless tobacco: Never  ? Tobacco comments:  ?  smoked 81yr, smoked 1/2 pack a day  ?Vaping Use  ? Vaping Use: Never used  ?Substance and Sexual Activity  ? Alcohol use: Not Currently  ?  Alcohol/week: 3.0 - 4.0 standard drinks  ?  Types: 3 - 4 Cans of beer per week  ?  Comment: social- beer , not on regular basis  ? Drug use: No  ? Sexual activity: Yes  ?Other Topics Concern  ? Not on file  ?Social History Narrative  ? Consumes 2-3 cups of caffeine daily  ? Right handed  ? Lives in a one story home with a basement    ? ?Social Determinants of Health  ? ?Financial Resource Strain: Not on file  ?Food Insecurity: Not on file  ?Transportation Needs: Not on file  ?Physical Activity: Not on file  ?Stress: Not on file  ?Social [employer]

## 2021-11-08 NOTE — Progress Notes (Signed)
? ?Referring Provider: Celene Squibb, MD ?Primary Care Physician:  Celene Squibb, MD ?Primary GI Physician: new ? ?Chief Complaint  ?Patient presents with  ? Hemorrhoids  ?  Patient here today due to issues with hemorrhoids. He denies any dark or bloody stools. He says he seen Dr. Casimer Leek two weeks ago, and he gave patient a compound from [place] which did not seem to be of any help..  ? ?HPI:   ?[name] [name] is a 81 y.o. male with past medical history of RLS, thyroid disease, HLD, OSA, HTN, CKD, prediabetes,  ? ?Patient presenting today as new patient for hemorrhoids. ? ?He reports that he had rectal exam earlier this month with PCP and was told he had hemorrhoids. Previously using preparation H, PCP sent a topical cream from [employer], however, has not noticed any improvement from this. States that he is having burning, itching and some discomfort in his rectal area after having a BM. Denies any rectal bleeding. Denies any severe rectal pain.  ? ?Endorses some occasional nausea, usually noticed more after eating certain foods. No vomiting. States that he really has only noticed it since having issues with his hemorrhoids. Is having some issues with constipation, BMs are very sporadic. May have one for a few days in a row and then may go 2-3 days without one. Is using magnesium citrate, just a few oz once every few days.  Is not sure if he has tried anything else other than fiber pills for his constipation in the past, states he is drinking 2-3 bottles of water per day. Is taking some fruit/veggies pills x2 months because he was not sure if he was getting enough of these in his diet. Denies any issues with dysphagia or odynophagia. GERD well controlled on Nexium once daily without breakthrough symptoms. Feels that appetite has decreased some over the past few years as he notices he tends to eat a little less than he used to. Denies early satiety or bloating. Denies any weight loss.   ? ?NSAID use: ibuprofen only occasionally  ?Social hx:no etoh or tobacco ?Fam hx: no CRC or liver disease ? ?Last Colonoscopy:4/29/13Examination performed to cecum. ?Two erosions possibly residual from recent bout of colitis. ?Mild sigmoid colon diverticulosis and external hemorrhoids. ?Last Endoscopy:never ? ?Recommendations:  ?Repeat colonoscopy April 2023 ? ?Past Medical History:  ?Diagnosis Date  ? Arthritis   ? CAD (coronary artery disease)   ? CAD (coronary artery disease) 12/04/2011  ? Left Main Disease with 3- vessel CAD   ? CAD (coronary artery disease)   ? seeing Dr Harl Bowie  ? CHF (congestive heart failure) ([place])   ? Deviated septum   ? Dyslipidemia   ? GERD (gastroesophageal reflux disease)   ? Heart attack ([place])   ? Hyperlipidemia   ? Hypertension   ? Hypothyroidism   ? MI (myocardial infarction) ([place]) 1997  ? Neuropathy   ? Obesity (BMI 30-39.9) 09/02/2018  ? PONV (postoperative nausea and vomiting)   ? has in past, no problems after most recent surgery  ? 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 LIMA to LAD, SVG to OM2, SVG to PDA, EVH via right thigh  ? S/P carotid endarterectomy 02/29/2012  ? Right CEA for asymptomatic severe right ICA stenosis   ? 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 Left Ear Rim (Cx3,5FU)  ? Shortness of breath   ? with exertion  ? Sleep apnea   ? CPAP, sleep study at [facility]  ?  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 80-99% stenosis RICA, asymptomatic  ? Thyroid nodule   ? Wears glasses   ? or contacts  ? ? ?Past Surgical History:  ?Procedure Laterality Date  ? ADENOIDECTOMY    ? CARDIAC CATHETERIZATION    ? 02/22/12  ? CARDIOVASCULAR STRESS TEST  7/13  ? CAROTID ENDARTERECTOMY    ? COLONOSCOPY  15-20years and 12/04/11  ? Done at [facility]  ? COLONOSCOPY  12/11/2011  ? Procedure: COLONOSCOPY;  Surgeon: Rogene Houston, MD;  [facility];  Service: Endoscopy;  Laterality: N/A;  830  ? CORONARY ARTERY BYPASS GRAFT  02/29/2012  ? Procedure: CORONARY ARTERY BYPASS GRAFTING  (CABG);  Surgeon: Rexene Alberts, MD;  Location: [place];  Service: Open Heart Surgery;  Laterality: N/A;  times three using Left Internal Mammary Artery and Right Greater Saphenous Vein Graft Harvested Endoscopically  ? CORONARY STENT PLACEMENT    ? ENDARTERECTOMY  02/29/2012  ? Procedure: ENDARTERECTOMY CAROTID;  Surgeon: Angelia Mould, MD;  Location: [place];  Service: Vascular;  Laterality: Right;  ? FOOT SURGERY  06/30/11  ? artificial joint in big toe right foot   ? KNEE ARTHROSCOPY WITH MEDIAL MENISECTOMY Left 07/15/2019  ? Procedure: KNEE ARTHROSCOPY WITH MEDIAL MENISCECTOMY AND LATERAL MENISCECTOMY;  Surgeon: Carole Civil, MD;  Location: [facility];  Service: Orthopedics;  Laterality: Left;  ? LEFT HEART CATHETERIZATION WITH CORONARY ANGIOGRAM N/A 02/20/2012  ? Procedure: LEFT HEART CATHETERIZATION WITH CORONARY ANGIOGRAM;  Surgeon: [place] Sine, MD;  Location: [facility] CATH LAB;  Service: Cardiovascular;  Laterality: N/A;  ? NASAL SEPTUM SURGERY    ? Totol thyroidectomy  02/06/2011  ? TRANSESOPHAGEAL ECHOCARDIOGRAM    ? UPPER GASTROINTESTINAL ENDOSCOPY  35-40 years ago  ? [facility]  ? ? ?Current Outpatient Medications  ?Medication Sig Dispense Refill  ? amLODipine (NORVASC) 5 MG tablet Take 1 tablet (5 mg total) by mouth daily. 90 tablet 2  ? aspirin EC 81 MG tablet Take 81 mg by mouth daily.    ? atorvastatin (LIPITOR) 80 MG tablet Take 1 tablet (80 mg total) by mouth daily. 90 tablet 3  ? diclofenac (VOLTAREN) 75 MG EC tablet TAKE (1) TABLET BY MOUTH TWICE DAILY. 60 tablet 0  ? DULoxetine (CYMBALTA) 30 MG capsule Take 1 capsule (30 mg total) by mouth daily. 30 capsule 3  ? Esomeprazole Magnesium (NEXIUM PO) Take 20 mg by mouth daily.     ? metoprolol (LOPRESSOR) 50 MG tablet Take 1 tablet (50 mg total) by mouth 2 (two) times daily. 180 tablet 1  ? Multiple Vitamins-Minerals (PRESERVISION AREDS 2 PO) Take 1 tablet by mouth 2 (two) times daily.     ? Omega-3 Fatty Acids (FISH OIL) 1000 MG CPDR Take  1,000 mg by mouth 2 (two) times daily.     ? promethazine (PHENERGAN) 12.5 MG tablet Take 1 tablet (12.5 mg total) by mouth every 6 (six) hours as needed for nausea or vomiting. 30 tablet 0  ? ramipril (ALTACE) 10 MG capsule Take 10 mg by mouth daily.    ? SYNTHROID 112 MCG tablet Take 112 mcg by mouth daily before breakfast.     ? zolpidem (AMBIEN) 5 MG tablet Take 10 mg by mouth at bedtime as needed for sleep.    ? zolpidem (AMBIEN CR) 12.5 MG CR tablet Take 1 tablet (12.5 mg total) by mouth at bedtime as needed for sleep. (Patient not taking: Reported on 11/08/2021) 30 tablet  5  ? ?No current facility-administered medications for this visit.  ? ? ?Allergies as of 11/08/2021  ? (No Known Allergies)  ? ? ?Family History  ?Problem Relation Age of Onset  ? Hypertension Mother   ? Healthy Sister   ? Healthy Brother   ? Healthy Son   ? Heart attack Father   ? Heart disease Father   ?     Before age 1  ? Hyperlipidemia Father   ? Hypertension Father   ? Prostate cancer Father   ? Colon cancer Neg Hx   ? ? ?Social History  ? ?Socioeconomic History  ? Marital status: Married  ?  Spouse name: Velva Harman  ? Number of children: 1  ? Years of education: 78  ? Highest education level: Not on file  ?Occupational History  ?  Comment: retired  ?Tobacco Use  ? Smoking status: Former  ?  Packs/day: 0.50  ?  Years: 15.00  ?  Pack years: 7.50  ?  Types: Cigarettes  ?  Quit date: 09/04/2001  ?  Years since quitting: 20.1  ? Smokeless tobacco: Never  ? Tobacco comments:  ?  smoked 89yr, smoked 1/2 pack a day  ?Vaping Use  ? Vaping Use: Never used  ?Substance and Sexual Activity  ? Alcohol use: Not Currently  ?  Alcohol/week: 3.0 - 4.0 standard drinks  ?  Types: 3 - 4 Cans of beer per week  ?  Comment: social- beer , not on regular basis  ? Drug use: No  ? Sexual activity: Yes  ?Other Topics Concern  ? Not on file  ?Social History Narrative  ? Consumes 2-3 cups of caffeine daily  ? Right handed  ? Lives in a one story home with a basement    ? ?Social Determinants of Health  ? ?Financial Resource Strain: Not on file  ?Food Insecurity: Not on file  ?Transportation Needs: Not on file  ?Physical Activity: Not on file  ?Stress: Not on file  ?Social [employer]

## 2021-11-09 ENCOUNTER — Encounter (INDEPENDENT_AMBULATORY_CARE_PROVIDER_SITE_OTHER): Payer: Self-pay

## 2021-11-10 ENCOUNTER — Ambulatory Visit (INDEPENDENT_AMBULATORY_CARE_PROVIDER_SITE_OTHER): Payer: Medicare HMO | Admitting: Pulmonary Disease

## 2021-11-10 ENCOUNTER — Encounter: Payer: Self-pay | Admitting: Pulmonary Disease

## 2021-11-10 ENCOUNTER — Ambulatory Visit: Payer: Medicare HMO

## 2021-11-10 ENCOUNTER — Encounter (HOSPITAL_COMMUNITY): Payer: Medicare HMO

## 2021-11-10 VITALS — BP 136/82 | HR 62 | Temp 98.1°F | Ht 73.0 in | Wt 209.0 lb

## 2021-11-10 DIAGNOSIS — G4733 Obstructive sleep apnea (adult) (pediatric): Secondary | ICD-10-CM

## 2021-11-10 DIAGNOSIS — F5101 Primary insomnia: Secondary | ICD-10-CM

## 2021-11-10 MED ORDER — ZOLPIDEM TARTRATE 10 MG PO TABS: 10.0000 mg | ORAL_TABLET | Freq: Every day | ORAL | 5 refills | Status: DC

## 2021-11-10 MED ORDER — ZOLPIDEM TARTRATE 10 MG PO TABS: 10.0000 mg | ORAL_TABLET | Freq: Every evening | ORAL | 5 refills | Status: DC | PRN

## 2021-11-10 MED ORDER — TRAZODONE HCL 50 MG PO TABS: 50.0000 mg | ORAL_TABLET | Freq: Every day | ORAL | 2 refills | Status: DC

## 2021-11-10 NOTE — Progress Notes (Signed)
? ?[facility] ? ?Chief Complaint  ?Patient presents with  ? Follow-up  ?  Appt to discuss ambien   ? ? ?Past Surgical History:  ?He  has a past surgical history that includes Coronary stent placement; Nasal septum surgery; Totol thyroidectomy (02/06/2011); Foot surgery (06/30/11); Colonoscopy (15-20years and 12/04/11); Upper gastrointestinal endoscopy (35-40 years ago); Colonoscopy (12/11/2011); Cardiac catheterization; Adenoidectomy; Cardiovascular stress test (7/13); transesophageal echocardiogram; Coronary artery bypass graft (02/29/2012); Endarterectomy (02/29/2012); Carotid endarterectomy; left heart catheterization with coronary angiogram (N/A, 02/20/2012); and Knee arthroscopy with medial menisectomy (Left, 07/15/2019). ? ?Past Medical History:  ?OA, CAD, CHF, HLD, GERD, HTN, Hypothyroidism, Lumbar radiculopathy with b/l leg parasthesias, Carpal tunnel syndrome ? ?Constitutional:  ?BP 136/82 (BP Location: Left Arm, Patient Position: Sitting)   Pulse 62   Temp 98.1 ?F (36.7 ?C) (Temporal)   Ht '6\' 1"'$  (1.854 m)   Wt 209 lb (94.8 kg)   SpO2 98% Comment: ra  BMI 27.57 kg/m?  ? ?Brief Summary:  ?[name] [name] is a 81 y.o. male former smoker with obstructive sleep apnea and insomnia. ?  ? ? ? ?Subjective:  ? ?He is here with his wife. ? ?She has been concerned about his reliance on sleep medications.  She had a friend who was previously on [place] and then fell and broke her neck.   ? ?He was watching TV and playing on computer before going to bed.  He stopped doing this recently.  He has trouble shutting his mind down in the evening.  He has two clocks in his bedroom, and keeps an eye on them during the night when he can't stay asleep.  He tried [place] CR.  Didn't make any difference and this isn't covered by his insurance.  His wife says he is a "type A" personality.  He feels he is still able to keep up with his activity during the day without feeling too sleepy.  He was  going to bed at 9 pm, but pushed this back to 10 pm. ? ?Physical Exam:  ? ?Appearance - well kempt  ? ?ENMT - no sinus tenderness, no oral exudate, no LAN, Mallampati 4 airway, no stridor ? ?Respiratory - equal breath sounds bilaterally, no wheezing or rales ? ?CV - s1s2 regular rate and rhythm, no murmurs ? ?Ext - no clubbing, no edema ? ?Skin - no rashes ? ?Psych - normal mood and affect ?  ?Pulmonary testing:  ?Spirometry 02/27/12 >>  FEV1 3.11 (84%), FEV1% 75 ? ?Sleep Tests:  ?PSG 08/22/12 >> AHI 8.3, SpO2 low 88% ?CPAP titration 10/01/12 >> CPAP 12 cm H2O ?PSG 12/06/14 >> AHI 3.7, SpO2 low 86% ?CPAP titration 09/24/18 >> CPAP 10 cm H2O ?CPAP 07/20/21 to 08/18/21 >> used on 30 of 30 nights with average 9 hrs 18 min.  Average AHI 1 with CPAP 10 cm H2O ? ?Cardiac Tests:  ?Echo 03/26/13 >> EF 55 to 60%, grade 1 DD, mild MR, mild LA dilation ? ?Social History:  ?He  reports that he quit smoking about 20 years ago. His smoking use included cigarettes. He has a 7.50 pack-year smoking history. He has never used smokeless tobacco. He reports that he does not currently use alcohol after a past usage of about 3.0 - 4.0 standard drinks per week. He reports that he does not use drugs. ? ?Family History:  ?His family history includes Healthy in his brother, sister, and son; Heart attack in his father; Heart disease in his father; Hyperlipidemia  in his father; Hypertension in his father and mother; Prostate cancer in his father. ?  ? ? ?Assessment/Plan:  ? ?Obstructive sleep apnea. ?- he is compliant with CPAP and reports benefit from therapy ?- he uses [employer] for his DME ?- continue CPAP 10 cm H2O ? ?Insomnia. ?- could have a component of advanced sleep phase also ?- he has changed his bedtime from 9 to 10 pm ?- discussed stimulus control, relaxation techniques, and sleep restriction ?- discussed realistic expectations from sleep ?- belsomra and doxepin were ineffective ?- ambien CR didn't offer any improvement, and was not covered  by insurance ?- will change him back to ambien 10 mg nightly for now ?- will start trazodone 50 mg nightly; he can titrate this up as needed every two weeks to a dose of 150 mg nightly ?- if he finds that trazodone helps, will then consider slowly reducing the dose of ambien ? ?Time Spent Involved in Patient Care on Day of Examination:  ?36 minutes ? ?Follow up:  ? ?Patient Instructions  ?Will change you back to ambien (zolpidem) 10 mg pill nightly. ? ?Start trazodone 50 mg nightly.  You can increase the dose by 50 mg every 2 weeks if needed up to 150 mg nightly. ? ?Follow up in 3 months. ? ?Medication List:  ? ?Allergies as of 11/10/2021   ?No Known Allergies ?  ? ?  ?Medication List  ?  ? ?  ? Accurate as of November 10, 2021  3:47 PM. If you have any questions, ask your nurse or doctor.  ?  ?  ? ?  ? ?amLODipine 5 MG tablet ?Commonly known as: Norvasc ?Take 1 tablet (5 mg total) by mouth daily. ?  ?aspirin EC 81 MG tablet ?Take 81 mg by mouth daily. ?  ?atorvastatin 80 MG tablet ?Commonly known as: LIPITOR ?Take 1 tablet (80 mg total) by mouth daily. ?  ?diclofenac 75 MG EC tablet ?Commonly known as: VOLTAREN ?TAKE (1) TABLET BY MOUTH TWICE DAILY. ?  ?DULoxetine 30 MG capsule ?Commonly known as: Cymbalta ?Take 1 capsule (30 mg total) by mouth daily. ?  ?Fish Oil 1000 MG Cpdr ?Take 1,000 mg by mouth 2 (two) times daily. ?  ?hydrocortisone 2.5 % rectal cream ?Commonly known as: ANUSOL-HC ?Place 1 application. rectally 3 (three) times daily. Use three times a day for 10 days then as needed thereafter. ?  ?metoprolol tartrate 50 MG tablet ?Commonly known as: LOPRESSOR ?Take 1 tablet (50 mg total) by mouth 2 (two) times daily. ?  ?NEXIUM PO ?Take 20 mg by mouth daily. ?  ?PRESERVISION AREDS 2 PO ?Take 1 tablet by mouth 2 (two) times daily. ?  ?promethazine 12.5 MG tablet ?Commonly known as: PHENERGAN ?Take 1 tablet (12.5 mg total) by mouth every 6 (six) hours as needed for nausea or vomiting. ?  ?ramipril 10 MG  capsule ?Commonly known as: ALTACE ?Take 10 mg by mouth daily. ?  ?Synthroid 112 MCG tablet ?Generic drug: levothyroxine ?Take 112 mcg by mouth daily before breakfast. ?  ?traZODone 50 MG tablet ?Commonly known as: DESYREL ?Take 1 tablet (50 mg total) by mouth at bedtime. ?Started by: Chesley Mires, MD ?  ?zolpidem 10 MG tablet ?Commonly known as: AMBIEN ?Take 1 tablet (10 mg total) by mouth at bedtime. ?What changed:  ?medication strength ?when to take this ?reasons to take this ?Another medication with the same name was removed. Continue taking this medication, and follow the directions you see here. ?Changed by:  Chesley Mires, MD ?  ? ?  ? ? ?Signature:  ?Chesley Mires, MD ?[place] ?Pager - [phone] - [phone] ?11/10/2021, 3:47 PM ?  ? ? ? ? ? ? ?  ?

## 2021-11-10 NOTE — Patient Instructions (Signed)
Will change you back to ambien (zolpidem) 10 mg pill nightly. ? ?Start trazodone 50 mg nightly.  You can increase the dose by 50 mg every 2 weeks if needed up to 150 mg nightly. ? ?Follow up in 3 months. ?

## 2021-11-11 ENCOUNTER — Other Ambulatory Visit: Payer: Self-pay | Admitting: *Deleted

## 2021-11-11 DIAGNOSIS — I6523 Occlusion and stenosis of bilateral carotid arteries: Secondary | ICD-10-CM

## 2021-11-17 ENCOUNTER — Encounter: Payer: Self-pay | Admitting: Vascular Surgery

## 2021-11-17 ENCOUNTER — Ambulatory Visit (INDEPENDENT_AMBULATORY_CARE_PROVIDER_SITE_OTHER): Payer: Medicare HMO | Admitting: Vascular Surgery

## 2021-11-17 ENCOUNTER — Ambulatory Visit (HOSPITAL_COMMUNITY)
Admission: RE | Admit: 2021-11-17 | Discharge: 2021-11-17 | Disposition: A | Discharge status: Home / Self Care | Payer: Medicare HMO | Source: Ambulatory Visit | Attending: Vascular Surgery | Admitting: Vascular Surgery

## 2021-11-17 VITALS — BP 186/92 | HR 65 | Temp 98.1°F | Resp 20 | Ht 73.0 in | Wt 210.0 lb

## 2021-11-17 DIAGNOSIS — I6523 Occlusion and stenosis of bilateral carotid arteries: Secondary | ICD-10-CM | POA: Insufficient documentation

## 2021-11-17 DIAGNOSIS — I6521 Occlusion and stenosis of right carotid artery: Secondary | ICD-10-CM

## 2021-11-17 NOTE — Patient Instructions (Signed)
? ? ? ? ? ? Bartley D Bord ? 11/17/2021  ?  ? '@PREFPERIOPPHARMACY'$ @ ? ? Your procedure is scheduled on  11/29/2021. ? ? Report to [facility] at  0930  A.M. ? ? Call this number if you have problems the morning of surgery: ? [phone] ? ? Remember: ? Follow the diet and prep instructions given to you by the office. ?  ? Take these medicines the morning of surgery with A SIP OF WATER  ? ?amlodipine, cymbalta, nexium, metoprolol, synthroid. ?  ? Do not wear jewelry, make-up or nail polish. ? Do not wear lotions, powders, or perfumes, or deodorant. ? Do not shave 48 hours prior to surgery.  Men may shave face and neck. ? Do not bring valuables to the hospital. ? [facility] is not responsible for any belongings or valuables. ? ?Contacts, dentures or bridgework may not be worn into surgery.  Leave your suitcase in the car.  After surgery it may be brought to your room. ? ?For patients admitted to the hospital, discharge time will be determined by your treatment team. ? ?Patients discharged the day of surgery will not be allowed to drive home and must have someone with them for 24 hours.  ? ? ?Special instructions:   DO NOT smoke tobacco or vape for 24 hors before your procedure. ? ?Please read over the following fact sheets that you were given. ?Anesthesia Post-op Instructions and Care and Recovery After Surgery ?  ? ? ? Colonoscopy, Adult, Care After ?This sheet gives you information about how to care for yourself after your procedure. Your health care provider may also give you more specific instructions. If you have problems or questions, contact your health care provider. ?What can I expect after the procedure? ?After the procedure, it is common to have: ?A small amount of blood in your stool for 24 hours after the procedure. ?Some gas. ?Mild cramping or bloating of your abdomen. ?Follow these instructions at home: ?Eating and drinking ? ?Drink enough fluid to keep your urine pale yellow. ?Follow instructions from  your health care provider about eating or drinking restrictions. ?Resume your normal diet as instructed by your health care provider. Avoid heavy or fried foods that are hard to digest. ?Activity ?Rest as told by your health care provider. ?Avoid sitting for a long time without moving. Get up to take short walks every 1-2 hours. This is important to improve blood flow and breathing. Ask for help if you feel weak or unsteady. ?Return to your normal activities as told by your health care provider. Ask your health care provider what activities are safe for you. ?Managing cramping and bloating ? ?Try walking around when you have cramps or feel bloated. ?Apply heat to your abdomen as told by your health care provider. Use the heat source that your health care provider recommends, such as a moist heat pack or a heating pad. ?Place a towel between your skin and the heat source. ?Leave the heat on for 20-30 minutes. ?Remove the heat if your skin turns bright red. This is especially important if you are unable to feel pain, heat, or cold. You may have a greater risk of getting burned. ?General instructions ?If you were given a sedative during the procedure, it can affect you for several hours. Do not drive or operate machinery until your health care provider says that it is safe. ?For the first 24 hours after the procedure: ?Do not sign important documents. ?Do not drink  alcohol. ?Do your regular daily activities at a slower pace than normal. ?Eat soft foods that are easy to digest. ?Take over-the-counter and prescription medicines only as told by your health care provider. ?Keep all follow-up visits as told by your health care provider. This is important. ?Contact a health care provider if: ?You have blood in your stool 2-3 days after the procedure. ?Get help right away if you have: ?More than a small spotting of blood in your stool. ?Large blood clots in your stool. ?Swelling of your abdomen. ?Nausea or vomiting. ?A  fever. ?Increasing pain in your abdomen that is not relieved with medicine. ?Summary ?After the procedure, it is common to have a small amount of blood in your stool. You may also have mild cramping and bloating of your abdomen. ?If you were given a sedative during the procedure, it can affect you for several hours. Do not drive or operate machinery until your health care provider says that it is safe. ?Get help right away if you have a lot of blood in your stool, nausea or vomiting, a fever, or increased pain in your abdomen. ?This information is not intended to replace advice given to you by your health care provider. Make sure you discuss any questions you have with your health care provider. ?Document Revised: 06/06/2019 Document Reviewed: 02/24/2019 ?Elsevier Patient Education ? [place]. ?Monitored Anesthesia Care, Care After ?This sheet gives you information about how to care for yourself after your procedure. Your health care provider may also give you more specific instructions. If you have problems or questions, contact your health care provider. ?What can I expect after the procedure? ?After the procedure, it is common to have: ?Tiredness. ?Forgetfulness about what happened after the procedure. ?Impaired judgment for important decisions. ?Nausea or vomiting. ?Some difficulty with balance. ?Follow these instructions at home: ?For the time period you were told by your health care provider: ?  ?Rest as needed. ?Do not participate in activities where you could fall or become injured. ?Do not drive or use machinery. ?Do not drink alcohol. ?Do not take sleeping pills or medicines that cause drowsiness. ?Do not make important decisions or sign legal documents. ?Do not take care of children on your own. ?Eating and drinking ?Follow the diet that is recommended by your health care provider. ?Drink enough fluid to keep your urine pale yellow. ?If you vomit: ?Drink water, juice, or soup when you can drink  without vomiting. ?Make sure you have little or no nausea before eating solid foods. ?General instructions ?Have a responsible adult stay with you for the time you are told. It is important to have someone help care for you until you are awake and alert. ?Take over-the-counter and prescription medicines only as told by your health care provider. ?If you have sleep apnea, surgery and certain medicines can increase your risk for breathing problems. Follow instructions from your health care provider about wearing your sleep device: ?Anytime you are sleeping, including during daytime naps. ?While taking prescription pain medicines, sleeping medicines, or medicines that make you drowsy. ?Avoid smoking. ?Keep all follow-up visits as told by your health care provider. This is important. ?Contact a health care provider if: ?You keep feeling nauseous or you keep vomiting. ?You feel light-headed. ?You are still sleepy or having trouble with balance after 24 hours. ?You develop a rash. ?You have a fever. ?You have redness or swelling around the IV site. ?Get help right away if: ?You have trouble breathing. ?You have new-onset  confusion at home. ?Summary ?For several hours after your procedure, you may feel tired. You may also be forgetful and have poor judgment. ?Have a responsible adult stay with you for the time you are told. It is important to have someone help care for you until you are awake and alert. ?Rest as told. Do not drive or operate machinery. Do not drink alcohol or take sleeping pills. ?Get help right away if you have trouble breathing, or if you suddenly become confused. ?This information is not intended to replace advice given to you by your health care provider. Make sure you discuss any questions you have with your health care provider. ?Document Revised: 04/15/2020 Document Reviewed: 07/03/2019 ?Elsevier Patient Education ? [place]. ? ?

## 2021-11-17 NOTE — Progress Notes (Signed)
? ? ?REASON FOR VISIT:  ? ?Follow-up of carotid disease. ? ?MEDICAL ISSUES:  ? ?S/P RIGHT CAROTID ENDARTERECTOMY: This patient underwent a right carotid endarterectomy in 2013.  This was a combined right carotid endarterectomy at the same time as coronary revascularization by Dr. Ihor Gully.  He comes in for his yearly study.  There is no evidence of recurrent carotid stenosis on the right and he has no stenosis on the left.  Both vertebral arteries are patent with antegrade flow.  He is on aspirin and is on a statin.  I have ordered a follow-up carotid duplex scan in 1 year and he will be seen back at that time. ? ? ?HPI:  ? ?[name] [name] is a pleasant 81 y.o. male who underwent a combined right carotid endarterectomy at the time of coronary revascularization back in 2013.  He comes in for routine follow-up visit.  Since I saw him last, he denies any history of stroke, TIAs, expressive or receptive aphasia, or amaurosis fugax. ? ?He does have a pinched nerve in his back and is having significant symptoms from that.  He is in the process of considering surgery.  He has had injection therapy which has not helped too much. ? ?He quit smoking in 2003.  He is on aspirin and is on a statin. ? ?Past Medical History:  ?Diagnosis Date  ? Arthritis   ? CAD (coronary artery disease)   ? CAD (coronary artery disease) 12/04/2011  ? Left Main Disease with 3- vessel CAD   ? CAD (coronary artery disease)   ? seeing Dr Harl Bowie  ? CHF (congestive heart failure) ([place])   ? Deviated septum   ? Dyslipidemia   ? GERD (gastroesophageal reflux disease)   ? Heart attack ([place])   ? Hyperlipidemia   ? Hypertension   ? Hypothyroidism   ? MI (myocardial infarction) ([place]) 1997  ? Neuropathy   ? Obesity (BMI 30-39.9) 09/02/2018  ? PONV (postoperative nausea and vomiting)   ? has in past, no problems after most recent surgery  ? 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 LIMA to LAD, SVG to OM2, SVG to PDA, EVH via right thigh  ? S/P carotid endarterectomy 02/29/2012  ?  Right CEA for asymptomatic severe right ICA stenosis   ? 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 Left Ear Rim (Cx3,5FU)  ? Shortness of breath   ? with exertion  ? Sleep apnea   ? CPAP, sleep study at [facility]  ? 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 80-99% stenosis RICA, asymptomatic  ? Thyroid nodule   ? Wears glasses   ? or contacts  ? ? ?Family History  ?Problem Relation Age of Onset  ? Hypertension Mother   ? Healthy Sister   ? Healthy Brother   ? Healthy Son   ? Heart attack Father   ? Heart disease Father   ?     Before age 24  ? Hyperlipidemia Father   ? Hypertension Father   ? Prostate cancer Father   ? Colon cancer Neg Hx   ? ? ?SOCIAL HISTORY: ?Social History  ? ?Tobacco Use  ? Smoking status: Former  ?  Packs/day: 0.50  ?  Years: 15.00  ?  Pack years: 7.50  ?  Types: Cigarettes  ?  Quit date: 09/04/2001  ?  Years since quitting: 20.2  ? Smokeless tobacco: Never  ? Tobacco comments:  ?  smoked 67yr, smoked 1/2 pack a day  ?  Substance Use Topics  ? Alcohol use: Not Currently  ?  Alcohol/week: 3.0 - 4.0 standard drinks  ?  Types: 3 - 4 Cans of beer per week  ?  Comment: social- beer , not on regular basis  ? ? ?No Known Allergies ? ?Current Outpatient Medications  ?Medication Sig Dispense Refill  ? amLODipine (NORVASC) 5 MG tablet Take 1 tablet (5 mg total) by mouth daily. 90 tablet 2  ? aspirin EC 81 MG tablet Take 81 mg by mouth daily.    ? atorvastatin (LIPITOR) 80 MG tablet Take 1 tablet (80 mg total) by mouth daily. 90 tablet 3  ? diclofenac (VOLTAREN) 75 MG EC tablet TAKE (1) TABLET BY MOUTH TWICE DAILY. 60 tablet 0  ? DULoxetine (CYMBALTA) 30 MG capsule Take 1 capsule (30 mg total) by mouth daily. 30 capsule 3  ? Esomeprazole Magnesium (NEXIUM PO) Take 20 mg by mouth daily.     ? hydrocortisone (ANUSOL-HC) 2.5 % rectal cream Place 1 application. rectally 3 (three) times daily. Use three times a day for 10 days then as needed thereafter. 60 g 1  ? metoprolol (LOPRESSOR) 50 MG tablet Take 1  tablet (50 mg total) by mouth 2 (two) times daily. 180 tablet 1  ? Multiple Vitamins-Minerals (PRESERVISION AREDS 2 PO) Take 1 tablet by mouth 2 (two) times daily.     ? Omega-3 Fatty Acids (FISH OIL) 1000 MG CPDR Take 1,000 mg by mouth 2 (two) times daily.     ? promethazine (PHENERGAN) 12.5 MG tablet Take 1 tablet (12.5 mg total) by mouth every 6 (six) hours as needed for nausea or vomiting. 30 tablet 0  ? ramipril (ALTACE) 10 MG capsule Take 10 mg by mouth daily.    ? SYNTHROID 112 MCG tablet Take 112 mcg by mouth daily before breakfast.     ? traZODone (DESYREL) 50 MG tablet Take 1 tablet (50 mg total) by mouth at bedtime. 30 tablet 2  ? zolpidem (AMBIEN) 10 MG tablet Take 1 tablet (10 mg total) by mouth at bedtime. 30 tablet 5  ? ?No current facility-administered medications for this visit.  ? ? ?REVIEW OF SYSTEMS:  ?'[X]'$  denotes positive finding, '[ ]'$  denotes negative finding ?Cardiac  Comments:  ?Chest pain or chest pressure:    ?Shortness of breath upon exertion:    ?Short of breath when lying flat:    ?Irregular heart rhythm:    ?    ?Vascular    ?Pain in calf, thigh, or hip brought on by ambulation:    ?Pain in feet at night that wakes you up from your sleep:     ?Blood clot in your veins:    ?Leg swelling:     ?    ?Pulmonary    ?Oxygen at home:    ?Productive cough:     ?Wheezing:     ?    ?Neurologic    ?Sudden weakness in arms or legs:     ?Sudden numbness in arms or legs:     ?Sudden onset of difficulty speaking or slurred speech:    ?Temporary loss of vision in one eye:     ?Problems with dizziness:     ?    ?Gastrointestinal    ?Blood in stool:     ?Vomited blood:     ?    ?Genitourinary    ?Burning when urinating:     ?Blood in urine:    ?    ?Psychiatric    ?  Major depression:     ?    ?Hematologic    ?Bleeding problems:    ?Problems with blood clotting too easily:    ?    ?Skin    ?Rashes or ulcers:    ?    ?Constitutional    ?Fever or chills:    ? ?PHYSICAL EXAM:  ? ?Vitals:  ? 11/17/21 0841  11/17/21 0845  ?BP: (!) 171/83 (!) 186/92  ?Pulse: 65   ?Resp: 20   ?Temp: 98.1 ?F (36.7 ?C)   ?SpO2: 93%   ?Weight: 210 lb (95.3 kg)   ?Height: '6\' 1"'$  (1.854 m)   ? ? ?GENERAL: The patient is a well-nourished male, in no acute distress. The vital signs are documented above. ?CARDIAC: There is a regular rate and rhythm.  ?VASCULAR: I do not detect carotid bruits. ?He has palpable femoral and pedal pulses bilaterally. ?He has no significant lower extremity swelling. ?PULMONARY: There is good air exchange bilaterally without wheezing or rales. ?ABDOMEN: Soft and non-tender with normal pitched bowel sounds.  I do not palpate an aneurysm. ?MUSCULOSKELETAL: There are no major deformities or cyanosis. ?NEUROLOGIC: No focal weakness or paresthesias are detected. ?SKIN: There are no ulcers or rashes noted. ?PSYCHIATRIC: The patient has a normal affect. ? ?DATA:   ? ?CAROTID DUPLEX: I have independently interpreted his carotid duplex scan today. ? ?On the right side there is no evidence of recurrent stenosis.  The right vertebral artery is patent with antegrade flow. ? ?On the left side there is a less than 39% stenosis.  The left vertebral artery is patent with antegrade flow. ? ? ?Deitra Mayo ?[facility] ?Office [phone] ?

## 2021-11-23 ENCOUNTER — Encounter (HOSPITAL_COMMUNITY)
Admission: RE | Admit: 2021-11-23 | Discharge: 2021-11-23 | Disposition: A | Discharge status: Home / Self Care | Payer: Medicare HMO | Source: Ambulatory Visit | Attending: Gastroenterology | Admitting: Gastroenterology

## 2021-11-23 ENCOUNTER — Other Ambulatory Visit: Payer: Self-pay

## 2021-11-23 ENCOUNTER — Encounter (HOSPITAL_COMMUNITY): Payer: Self-pay

## 2021-11-29 ENCOUNTER — Encounter (HOSPITAL_COMMUNITY): Payer: Self-pay | Admitting: Gastroenterology

## 2021-11-29 ENCOUNTER — Ambulatory Visit (HOSPITAL_COMMUNITY)
Admission: RE | Admit: 2021-11-29 | Discharge: 2021-11-29 | Disposition: A | Discharge status: Home / Self Care | Payer: Medicare HMO | Attending: Gastroenterology | Admitting: Gastroenterology

## 2021-11-29 ENCOUNTER — Ambulatory Visit (HOSPITAL_BASED_OUTPATIENT_CLINIC_OR_DEPARTMENT_OTHER): Payer: Medicare HMO | Admitting: Anesthesiology

## 2021-11-29 ENCOUNTER — Encounter (HOSPITAL_COMMUNITY)
Admission: RE | Disposition: A | Discharge status: Home / Self Care | Payer: Self-pay | Source: Home / Self Care | Attending: Gastroenterology

## 2021-11-29 ENCOUNTER — Ambulatory Visit (HOSPITAL_COMMUNITY): Payer: Medicare HMO | Admitting: Anesthesiology

## 2021-11-29 DIAGNOSIS — K648 Other hemorrhoids: Secondary | ICD-10-CM | POA: Insufficient documentation

## 2021-11-29 DIAGNOSIS — I2510 Atherosclerotic heart disease of native coronary artery without angina pectoris: Secondary | ICD-10-CM | POA: Insufficient documentation

## 2021-11-29 DIAGNOSIS — K5730 Diverticulosis of large intestine without perforation or abscess without bleeding: Secondary | ICD-10-CM

## 2021-11-29 DIAGNOSIS — I130 Hypertensive heart and chronic kidney disease with heart failure and stage 1 through stage 4 chronic kidney disease, or unspecified chronic kidney disease: Secondary | ICD-10-CM | POA: Diagnosis not present

## 2021-11-29 DIAGNOSIS — Z951 Presence of aortocoronary bypass graft: Secondary | ICD-10-CM | POA: Diagnosis not present

## 2021-11-29 DIAGNOSIS — R7303 Prediabetes: Secondary | ICD-10-CM | POA: Insufficient documentation

## 2021-11-29 DIAGNOSIS — E785 Hyperlipidemia, unspecified: Secondary | ICD-10-CM | POA: Insufficient documentation

## 2021-11-29 DIAGNOSIS — Z8249 Family history of ischemic heart disease and other diseases of the circulatory system: Secondary | ICD-10-CM | POA: Insufficient documentation

## 2021-11-29 DIAGNOSIS — N189 Chronic kidney disease, unspecified: Secondary | ICD-10-CM | POA: Insufficient documentation

## 2021-11-29 DIAGNOSIS — I252 Old myocardial infarction: Secondary | ICD-10-CM | POA: Insufficient documentation

## 2021-11-29 DIAGNOSIS — G4733 Obstructive sleep apnea (adult) (pediatric): Secondary | ICD-10-CM | POA: Insufficient documentation

## 2021-11-29 DIAGNOSIS — I509 Heart failure, unspecified: Secondary | ICD-10-CM | POA: Diagnosis not present

## 2021-11-29 DIAGNOSIS — K219 Gastro-esophageal reflux disease without esophagitis: Secondary | ICD-10-CM | POA: Insufficient documentation

## 2021-11-29 DIAGNOSIS — Z87891 Personal history of nicotine dependence: Secondary | ICD-10-CM | POA: Insufficient documentation

## 2021-11-29 DIAGNOSIS — Z1211 Encounter for screening for malignant neoplasm of colon: Secondary | ICD-10-CM

## 2021-11-29 DIAGNOSIS — K5900 Constipation, unspecified: Secondary | ICD-10-CM | POA: Insufficient documentation

## 2021-11-29 DIAGNOSIS — E039 Hypothyroidism, unspecified: Secondary | ICD-10-CM | POA: Diagnosis not present

## 2021-11-29 DIAGNOSIS — I10 Essential (primary) hypertension: Secondary | ICD-10-CM | POA: Diagnosis not present

## 2021-11-29 HISTORY — PX: COLONOSCOPY WITH PROPOFOL: SHX5780

## 2021-11-29 LAB — HM COLONOSCOPY

## 2021-11-29 SURGERY — COLONOSCOPY WITH PROPOFOL
Anesthesia: General

## 2021-11-29 MED ORDER — LACTATED RINGERS IV SOLN
INTRAVENOUS | Status: DC
  Administered 2021-11-29: 1000 mL via INTRAVENOUS

## 2021-11-29 MED ORDER — PROPOFOL 10 MG/ML IV BOLUS
INTRAVENOUS | Status: DC | PRN
  Administered 2021-11-29: 100 mg via INTRAVENOUS

## 2021-11-29 MED ORDER — PROPOFOL 500 MG/50ML IV EMUL
INTRAVENOUS | Status: DC | PRN
Start: 2021-11-29 — End: 2021-11-29
  Administered 2021-11-29: 150 ug/kg/min via INTRAVENOUS

## 2021-11-29 NOTE — Op Note (Signed)
[facility] ?Patient Name: [name] [name] ?Procedure Date: 11/29/2021 11:09 AM ?MRN: [id number] ?Date of Birth: [birth year] ?Attending MD: Maylon Peppers ,  ?CSN: [id number] ?Age: 81 ?Admit Type: Outpatient ?Procedure:                Colonoscopy ?Indications:              Screening for colorectal malignant neoplasm ?Providers:                Maylon Peppers, Janeece Riggers, RN, Suzan Garibaldi.  ?                          Risa Grill, Technician ?Referring MD:              ?Medicines:                Monitored Anesthesia Care ?Complications:            No immediate complications. ?Estimated Blood Loss:     Estimated blood loss: none. ?Procedure:                Pre-Anesthesia Assessment: ?                          - Prior to the procedure, a History and Physical  ?                          was performed, and patient medications, allergies  ?                          and sensitivities were reviewed. The patient's  ?                          tolerance of previous anesthesia was reviewed. ?                          - The risks and benefits of the procedure and the  ?                          sedation options and risks were discussed with the  ?                          patient. All questions were answered and informed  ?                          consent was obtained. ?                          - ASA Grade Assessment: II - A patient with mild  ?                          systemic disease. ?                          After obtaining informed consent, the colonoscope  ?                          was passed under direct vision. Throughout the  ?  procedure, the patient's blood pressure, pulse, and  ?                          oxygen saturations were monitored continuously. The  ?                          PCF-HQ190L ([id number]) scope was introduced through  ?                          the anus and advanced to the the cecum, identified  ?                          by appendiceal orifice and ileocecal valve. The   ?                          quality of the bowel preparation was excellent. The  ?                          colonoscopy was performed without difficulty. The  ?                          patient tolerated the procedure well. ?Scope In: 11:24:01 AM ?Scope Out: 11:47:38 AM ?Scope Withdrawal Time: 0 hours 18 minutes 39 seconds  ?Total Procedure Duration: 0 hours 23 minutes 37 seconds  ?Findings: ?     The perianal and digital rectal examinations were normal. ?     A few medium-mouthed diverticula were found in the sigmoid colon. ?     Non-bleeding internal hemorrhoids were found during retroflexion. The  ?     hemorrhoids were small. ?Impression:               - Diverticulosis in the sigmoid colon. ?                          - Non-bleeding internal hemorrhoids. ?                          - No specimens collected. ?Moderate Sedation: ?     Per Anesthesia Care ?Recommendation:           - Discharge patient to home (ambulatory). ?                          - Resume previous diet. ?                          - Repeat colonoscopy is not recommended due to  ?                          current age (70 years or older) for screening  ?                          purposes. ?                          - Will refer to Roseanne Kaufman, RN for hemorrhoidal  ?  banding. ?Procedure Code(s):        --- Professional --- ?                          X1062, Colorectal cancer screening; colonoscopy on  ?                          individual not meeting criteria for high risk ?Diagnosis Code(s):        --- Professional --- ?                          Z12.11, Encounter for screening for malignant  ?                          neoplasm of colon ?                          K64.8, Other hemorrhoids ?                          K57.30, Diverticulosis of large intestine without  ?                          perforation or abscess without bleeding ?CPT copyright 2019 American Medical Association. All rights reserved. ?The codes documented in this  report are preliminary and upon coder review may  ?be revised to meet current compliance requirements. ?Maylon Peppers, MD ?Maylon Peppers,  ?11/29/2021 11:55:15 AM ?This report has been signed electronically. ?Number of Addenda: 0 ?

## 2021-11-29 NOTE — Transfer of Care (Signed)
Immediate Anesthesia Transfer of Care Note ? ?Patient: Sigismund Cross Jaco ? ?Procedure(s) Performed: COLONOSCOPY WITH PROPOFOL ? ?Patient Location: PACU ? ?Anesthesia Type:General ? ?Level of Consciousness: awake, alert , oriented and patient cooperative ? ?Airway & Oxygen Therapy: Patient Spontanous Breathing and Patient connected to nasal cannula oxygen ? ?Post-op Assessment: Report given to RN, Post -op Vital signs reviewed and stable and Patient moving all extremities X 4 ? ?Post vital signs: Reviewed and stable ? ?Last Vitals:  ?Vitals Value Taken Time  ?BP 103/50 11/29/21 1151  ?Temp    ?Pulse 55 11/29/21 1151  ?Resp 14 11/29/21 1151  ?SpO2 95 % 11/29/21 1151  ? ? ?Last Pain:  ?Vitals:  ? 11/29/21 1151  ?TempSrc: Oral  ?PainSc: 0-No pain  ?   ? ?Patients Stated Pain Goal: 8 (11/29/21 1000) ? ?Complications: No notable events documented. ?

## 2021-11-29 NOTE — Interval H&P Note (Signed)
History and Physical Interval Note: ? ?11/29/2021 ?9:37 AM ? ?[name] [name]  has presented today for surgery, with the diagnosis of Screening Colonoscopy.  The various methods of treatment have been discussed with the patient and family. After consideration of risks, benefits and other options for treatment, the patient has consented to  Procedure(s) with comments: ?COLONOSCOPY WITH PROPOFOL (N/A) - 1105 ASA 2 as a surgical intervention.  The patient's history has been reviewed, patient examined, no change in status, stable for surgery.  I have reviewed the patient's chart and labs.  Questions were answered to the patient's satisfaction.   ? ? ?[name] ? ? ?

## 2021-11-29 NOTE — Anesthesia Postprocedure Evaluation (Signed)
Anesthesia Post Note ? ?Patient: [name] [name] ? ?Procedure(s) Performed: COLONOSCOPY WITH PROPOFOL ? ?Patient location during evaluation: Phase II ?Anesthesia Type: General ?Level of consciousness: awake ?Pain management: pain level controlled ?Vital Signs Assessment: post-procedure vital signs reviewed and stable ?Respiratory status: spontaneous breathing and respiratory function stable ?Cardiovascular status: blood pressure returned to baseline and stable ?Postop Assessment: no headache and no apparent nausea or vomiting ?Anesthetic complications: no ?Comments: Late entry ? ? ?No notable events documented. ? ? ?Last Vitals:  ?Vitals:  ? 11/29/21 1011 11/29/21 1151  ?BP: (!) 169/82 (!) 103/50  ?Pulse:  (!) 55  ?Resp:  14  ?Temp:    ?SpO2:  95%  ?  ?Last Pain:  ?Vitals:  ? 11/29/21 1151  ?TempSrc: Oral  ?PainSc: 0-No pain  ? ? ?  ?  ?  ?  ?  ?  ? ?Louann Sjogren ? ? ? ? ?

## 2021-11-29 NOTE — Anesthesia Preprocedure Evaluation (Signed)
Anesthesia Evaluation  ?Patient identified by MRN, date of birth, ID band ?Patient awake ? ? ? ?Reviewed: ?Allergy & Precautions, H&P , NPO status , Patient's Chart, lab work & pertinent test results, reviewed documented beta blocker date and time  ? ?Airway ?Mallampati: II ? ?TM Distance: >3 FB ?Neck ROM: full ? ? ? Dental ?no notable dental hx. ? ?  ?Pulmonary ?neg pulmonary ROS, former smoker,  ?  ?Pulmonary exam normal ?breath sounds clear to auscultation ? ? ? ? ? ? Cardiovascular ?Exercise Tolerance: Good ?hypertension, + CAD, + Past MI and + CABG  ? ?Rhythm:regular Rate:Normal ? ? ?  ?Neuro/Psych ?negative neurological ROS ? negative psych ROS  ? GI/Hepatic ?Neg liver ROS, GERD  Medicated,  ?Endo/Other  ?Hypothyroidism  ? Renal/GU ?negative Renal ROS  ?negative genitourinary ?  ?Musculoskeletal ? ? Abdominal ?  ?Peds ? Hematology ?negative hematology ROS ?(+)   ?Anesthesia Other Findings ? ? Reproductive/Obstetrics ?negative OB ROS ? ?  ? ? ? ? ? ? ? ? ? ? ? ? ? ?  ?  ? ? ? ? ? ? ? ? ?Anesthesia Physical ?Anesthesia Plan ? ?ASA: 3 ? ?Anesthesia Plan: General  ? ?Post-op Pain Management:   ? ?Induction:  ? ?PONV Risk Score and Plan: Propofol infusion ? ?Airway Management Planned:  ? ?Additional Equipment:  ? ?Intra-op Plan:  ? ?Post-operative Plan:  ? ?Informed Consent: I have reviewed the patients History and Physical, chart, labs and discussed the procedure including the risks, benefits and alternatives for the proposed anesthesia with the patient or authorized representative who has indicated his/her understanding and acceptance.  ? ? ? ?Dental Advisory Given ? ?Plan Discussed with: CRNA ? ?Anesthesia Plan Comments:   ? ? ? ? ? ? ?Anesthesia Quick Evaluation ? ?

## 2021-11-29 NOTE — Discharge Instructions (Signed)
You are being discharged to home.  ?Resume your previous diet.  ?Your physician has indicated that a repeat colonoscopy is not recommended due to your current age (65 years or older) for screening purposes.  ?Will refer to Roseanne Kaufman, RN for hemorrhoidal banding. ?

## 2021-11-30 ENCOUNTER — Encounter (INDEPENDENT_AMBULATORY_CARE_PROVIDER_SITE_OTHER): Payer: Self-pay | Admitting: *Deleted

## 2021-12-01 ENCOUNTER — Ambulatory Visit (INDEPENDENT_AMBULATORY_CARE_PROVIDER_SITE_OTHER): Payer: Medicare HMO | Admitting: Gastroenterology

## 2021-12-01 ENCOUNTER — Encounter (HOSPITAL_COMMUNITY): Payer: Self-pay | Admitting: Gastroenterology

## 2021-12-15 ENCOUNTER — Encounter: Payer: Self-pay | Admitting: Gastroenterology

## 2021-12-15 ENCOUNTER — Ambulatory Visit (INDEPENDENT_AMBULATORY_CARE_PROVIDER_SITE_OTHER): Payer: Medicare HMO | Admitting: Gastroenterology

## 2021-12-15 VITALS — BP 165/80 | HR 58 | Temp 98.0°F | Ht 73.0 in | Wt 212.2 lb

## 2021-12-15 DIAGNOSIS — K640 First degree hemorrhoids: Secondary | ICD-10-CM | POA: Diagnosis not present

## 2021-12-15 NOTE — Progress Notes (Signed)
? ? ? ? ? ?  [place] BANDING PROCEDURE NOTE ? ?[name] [name] is a 81 y.o. male presenting today for consideration of hemorrhoid banding. Last colonoscopy by Dr. Jenetta Downer with sigmoid diverticulosis and hemorrhoids. Main complaint is of itching. No bleeding. He has used numerous creams.  ? ? ?The patient presents with symptomatic grade 1 hemorrhoids, unresponsive to maximal medical therapy, requesting rubber band ligation of his hemorrhoidal disease. All risks, benefits, and alternative forms of therapy were described and informed consent was obtained. ? ?The decision was made to band the left lateral internal hemorrhoid, and the [place] was used to perform band ligation without complication. Digital anorectal examination was then performed to assure proper positioning of the band, and to adjust the banded tissue as required. The patient was discharged home without pain or other issues. Dietary and behavioral recommendations were given, along with follow-up instructions. The patient will return in several weeks for followup and possible additional banding as required. ? ?No complications were encountered and the patient tolerated the procedure well.  ? ?Annitta Needs, PhD, ANP-BC ?[facility]  ? ?

## 2021-12-15 NOTE — Patient Instructions (Signed)
We will see you back in a few weeks for further banding! ? ?Continue to avoid straining and constipation. ? ?It was a pleasure to see you today. I want to create trusting relationships with patients to provide genuine, compassionate, and quality care. I value your feedback. If you receive a survey regarding your visit,  I greatly appreciate you taking time to fill this out.  ? ?Annitta Needs, PhD, ANP-BC ?[facility]  ? ?

## 2021-12-29 ENCOUNTER — Ambulatory Visit (INDEPENDENT_AMBULATORY_CARE_PROVIDER_SITE_OTHER): Payer: Medicare HMO | Admitting: Gastroenterology

## 2021-12-29 ENCOUNTER — Encounter: Payer: Self-pay | Admitting: Gastroenterology

## 2021-12-29 VITALS — BP 158/90 | HR 72 | Temp 97.6°F | Ht 73.0 in | Wt 208.6 lb

## 2021-12-29 DIAGNOSIS — K640 First degree hemorrhoids: Secondary | ICD-10-CM | POA: Diagnosis not present

## 2021-12-29 NOTE — Patient Instructions (Signed)
Please continue to avoid straining.  You should limit your toilet time to 2-3 minutes at the most.   Continue to avoid constipation.  Please call me with any concerns or issues!  I will see you in follow-up for additional banding in several weeks.    I enjoyed seeing you again today! As you know, I value our relationship and want to provide genuine, compassionate, and quality care. I welcome your feedback. If you receive a survey regarding your visit,  I greatly appreciate you taking time to fill this out. See you next time!  Katanya Schlie W. Gloristine Turrubiates, PhD, ANP-BC [facility]       

## 2021-12-29 NOTE — Progress Notes (Signed)
       [place] BANDING PROCEDURE NOTE  [name] [name] is a 81 y.o. male presenting today for consideration of hemorrhoid banding. Last colonoscopy  by Dr. Jenetta Downer with sigmoid diverticulosis and hemorrhoids. Main complaint is of itching. No bleeding. He has used numerous creams. We have banded left lateral at initial visit.    The patient presents with symptomatic grade 1 hemorrhoids, unresponsive to maximal medical therapy, requesting rubber band ligation of his/her hemorrhoidal disease. All risks, benefits, and alternative forms of therapy were described and informed consent was obtained.  The decision was made to band the right posterior internal hemorrhoid, and the [place] was used to perform band ligation without complication. Digital anorectal examination was then performed to assure proper positioning of the band, and to adjust the banded tissue as required. The patient was discharged home without pain or other issues. Dietary and behavioral recommendations were given, along with follow-up instructions. The patient will return in several weeks for followup and possible additional banding as required.  No complications were encountered and the patient tolerated the procedure well.   Annitta Needs, PhD, ANP-BC [facility]

## 2022-01-05 ENCOUNTER — Encounter: Payer: Medicare HMO | Admitting: Gastroenterology

## 2022-01-12 ENCOUNTER — Ambulatory Visit (INDEPENDENT_AMBULATORY_CARE_PROVIDER_SITE_OTHER): Payer: Medicare HMO | Admitting: Gastroenterology

## 2022-01-12 ENCOUNTER — Encounter: Payer: Self-pay | Admitting: Gastroenterology

## 2022-01-12 VITALS — BP 140/90 | HR 68 | Temp 97.8°F | Ht 73.0 in | Wt 211.4 lb

## 2022-01-12 DIAGNOSIS — K640 First degree hemorrhoids: Secondary | ICD-10-CM

## 2022-01-12 NOTE — Patient Instructions (Signed)
Please continue to avoid straining.  You should limit your toilet time to 2-3 minutes at the most.   Continue to avoid constipation.  Please call me with any concerns or issues!  We will see you back as needed!  I enjoyed seeing you again today! As you know, I value our relationship and want to provide genuine, compassionate, and quality care. I welcome your feedback. If you receive a survey regarding your visit,  I greatly appreciate you taking time to fill this out. See you next time!  Prescilla Monger W. Romeo Zielinski, PhD, ANP-BC [facility]           

## 2022-01-12 NOTE — Progress Notes (Signed)
    [place] BANDING PROCEDURE NOTE  [name] [name] is a 81 y.o. male presenting today for consideration of hemorrhoid banding. Last colonoscopy by Dr. Jenetta Downer with sigmoid diverticulosis and hemorrhoids. Main complaints of itching. We have banded left lateral and right posterior.    The patient presents with symptomatic grade 1 hemorrhoids, unresponsive to maximal medical therapy, requesting rubber band ligation of his hemorrhoidal disease. All risks, benefits, and alternative forms of therapy were described and informed consent was obtained.  The decision was made to band the right anterior internal hemorrhoid but there was insufficient tissue; therefore, I banded neutrally, and the [place] was used to perform band ligation without complication. Digital anorectal examination was then performed to assure proper positioning of the band, and to adjust the banded tissue as required. The patient was discharged home without pain or other issues. Dietary and behavioral recommendations were given, along with follow-up instructions. The patient will return as needed.   No complications were encountered and the patient tolerated the procedure well.   Annitta Needs, PhD, ANP-BC [facility]

## 2022-01-23 ENCOUNTER — Ambulatory Visit (INDEPENDENT_AMBULATORY_CARE_PROVIDER_SITE_OTHER): Payer: Medicare HMO | Admitting: Pulmonary Disease

## 2022-01-23 ENCOUNTER — Encounter: Payer: Self-pay | Admitting: Pulmonary Disease

## 2022-01-23 VITALS — BP 150/88 | HR 61 | Temp 96.5°F | Ht 73.0 in | Wt 214.2 lb

## 2022-01-23 DIAGNOSIS — F5101 Primary insomnia: Secondary | ICD-10-CM

## 2022-01-23 DIAGNOSIS — G4733 Obstructive sleep apnea (adult) (pediatric): Secondary | ICD-10-CM

## 2022-01-23 MED ORDER — TRAZODONE HCL 100 MG PO TABS: 100.0000 mg | ORAL_TABLET | Freq: Every day | ORAL | 5 refills | Status: DC

## 2022-01-23 NOTE — Patient Instructions (Signed)
Trazodone 100 mg nighlty  Zolpidem 10 mg nightly  Follow up in 6 months

## 2022-01-23 NOTE — Progress Notes (Signed)
[facility]  Chief Complaint  Patient presents with   Follow-up    Patient states that he is no better since last visit. States he has good nights and bad nights.     Past Surgical History:  He  has a past surgical history that includes Coronary stent placement; Nasal septum surgery; Totol thyroidectomy (02/06/2011); Foot surgery (06/30/11); Colonoscopy (15-20years and 12/04/11); Upper gastrointestinal endoscopy (35-40 years ago); Colonoscopy (12/11/2011); Cardiac catheterization; Adenoidectomy; Cardiovascular stress test (7/13); transesophageal echocardiogram; Coronary artery bypass graft (02/29/2012); Endarterectomy (02/29/2012); Carotid endarterectomy; left heart catheterization with coronary angiogram (N/A, 02/20/2012); Knee arthroscopy with medial menisectomy (Left, 07/15/2019); and Colonoscopy with propofol (N/A, 11/29/2021).  Past Medical History:  OA, CAD, CHF, HLD, GERD, HTN, Hypothyroidism, Lumbar radiculopathy with b/l leg parasthesias, Carpal tunnel syndrome  Constitutional:  BP (!) 150/88 (BP Location: Left Arm, Patient Position: Sitting, Cuff Size: Normal)   Pulse 61   Temp (!) 96.5 F (35.8 C) (Oral)   Ht '6\' 1"'$  (1.854 m)   Wt 214 lb 3.2 oz (97.2 kg)   SpO2 96%   BMI 28.26 kg/m   Brief Summary:  [name] [name] is a 81 y.o. male former smoker with obstructive sleep apnea and insomnia.      Subjective:   He goes to bed at 10 pm and gets out of bed at 6 am.  Zolpidem helps him fall asleep.  He still has trouble staying asleep.  He tried trazodone, but didn't seem to make a difference.  However, he never tried a dose higher than 50 mg.  Using CPAP nightly without difficulty.  Physical Exam:   Appearance - well kempt   ENMT - no sinus tenderness, no oral exudate, no LAN, Mallampati 4 airway, no stridor  Respiratory - equal breath sounds bilaterally, no wheezing or rales  CV - s1s2 regular rate and rhythm, no murmurs  Ext - no  clubbing, no edema  Skin - no rashes  Psych - normal mood and affect   Pulmonary testing:  Spirometry 02/27/12 >>  FEV1 3.11 (84%), FEV1% 75  Sleep Tests:  PSG 08/22/12 >> AHI 8.3, SpO2 low 88% CPAP titration 10/01/12 >> CPAP 12 cm H2O PSG 12/06/14 >> AHI 3.7, SpO2 low 86% CPAP titration 09/24/18 >> CPAP 10 cm H2O CPAP 12/21/21 to 01/19/22 >> used on 30 of 30 nights with average 8 hrs 34 min.  Average AHI 0.8 with CPAP 10 cm H2O  Cardiac Tests:  Echo 03/26/13 >> EF 55 to 60%, grade 1 DD, mild MR, mild LA dilation  Social History:  He  reports that he quit smoking about 20 years ago. His smoking use included cigarettes. He has a 7.50 pack-year smoking history. He has never used smokeless tobacco. He reports that he does not currently use alcohol after a past usage of about 3.0 - 4.0 standard drinks of alcohol per week. He reports that he does not use drugs.  Family History:  His family history includes Healthy in his brother, sister, and son; Heart attack in his father; Heart disease in his father; Hyperlipidemia in his father; Hypertension in his father and mother; Prostate cancer in his father.     Assessment/Plan:   Obstructive sleep apnea. - he is compliant with CPAP and reports benefit from therapy - he uses [employer] for his DME - continue CPAP 10 cm H2O  Insomnia. - continue sleep restriction, relaxation techniques and stimulus control - belsomra and doxepin were ineffective - continue zolpidem  10 mg nightly - will try trazodone 100 mg nightly with zolpidem  Time Spent Involved in Patient Care on Day of Examination:  28 minutes  Follow up:   Patient Instructions  Trazodone 100 mg nighlty  Zolpidem 10 mg nightly  Follow up in 6 months  Medication List:   Allergies as of 01/23/2022   No Known Allergies      Medication List        Accurate as of January 23, 2022 12:08 PM. If you have any questions, ask your nurse or doctor.          amLODipine 5 MG  tablet Commonly known as: Norvasc Take 1 tablet (5 mg total) by mouth daily.   aspirin EC 81 MG tablet Take 81 mg by mouth daily.   atorvastatin 80 MG tablet Commonly known as: LIPITOR Take 1 tablet (80 mg total) by mouth daily.   diclofenac 75 MG EC tablet Commonly known as: VOLTAREN TAKE (1) TABLET BY MOUTH TWICE DAILY.   DULoxetine 30 MG capsule Commonly known as: Cymbalta Take 1 capsule (30 mg total) by mouth daily.   esomeprazole 20 MG capsule Commonly known as: NEXIUM Take 20 mg by mouth daily.   Fish Oil 1000 MG Cpdr Take 1,000 mg by mouth 2 (two) times daily.   hydrocortisone 2.5 % rectal cream Commonly known as: ANUSOL-HC Place 1 application. rectally 3 (three) times daily. Use three times a day for 10 days then as needed thereafter.   metoprolol tartrate 50 MG tablet Commonly known as: LOPRESSOR Take 1 tablet (50 mg total) by mouth 2 (two) times daily.   OVER THE COUNTER MEDICATION Take 3 capsules by mouth daily. Balance of Nature (Fruits and Vegetables)   PRESERVISION AREDS 2 PO Take 1 tablet by mouth 2 (two) times daily.   promethazine 12.5 MG tablet Commonly known as: PHENERGAN Take 1 tablet (12.5 mg total) by mouth every 6 (six) hours as needed for nausea or vomiting.   ramipril 10 MG capsule Commonly known as: ALTACE Take 10 mg by mouth daily.   Synthroid 112 MCG tablet Generic drug: levothyroxine Take 112 mcg by mouth daily before breakfast.   traZODone 100 MG tablet Commonly known as: DESYREL Take 1 tablet (100 mg total) by mouth at bedtime. What changed:  medication strength how much to take Changed by: Chesley Mires, MD   zolpidem 10 MG tablet Commonly known as: AMBIEN Take 1 tablet (10 mg total) by mouth at bedtime.        Signature:  Chesley Mires, MD [place] Pager - [phone] 01/23/2022, 12:08 PM

## 2022-02-06 ENCOUNTER — Telehealth: Payer: Self-pay | Admitting: Gastroenterology

## 2022-02-06 NOTE — Telephone Encounter (Signed)
Patient showed up at front desk wanting an ASAP appt with Lewie Loron, NP for another banding. I scheduled him for next Wednesday at 130pm and he is aware of which office to go to and he is also on the wait list, but he still wants a call back from the nurse to see what Tobi Bastos can recommend in the meantime.

## 2022-02-15 ENCOUNTER — Encounter: Payer: Self-pay | Admitting: Gastroenterology

## 2022-02-15 ENCOUNTER — Ambulatory Visit (INDEPENDENT_AMBULATORY_CARE_PROVIDER_SITE_OTHER): Payer: Medicare HMO | Admitting: Gastroenterology

## 2022-02-15 VITALS — BP 136/92 | HR 62 | Temp 98.2°F | Ht 73.0 in | Wt 213.8 lb

## 2022-02-15 DIAGNOSIS — K640 First degree hemorrhoids: Secondary | ICD-10-CM | POA: Diagnosis not present

## 2022-02-15 MED ORDER — KETOCONAZOLE 2 % EX CREA: 1.0000 | TOPICAL_CREAM | Freq: Two times a day (BID) | CUTANEOUS | 0 refills | Status: DC

## 2022-02-15 MED ORDER — HYDROCORTISONE (PERIANAL) 2.5 % EX CREA: 1.0000 | TOPICAL_CREAM | Freq: Two times a day (BID) | CUTANEOUS | 1 refills | Status: DC

## 2022-02-15 NOTE — Patient Instructions (Signed)
Please continue to avoid straining.  You should limit your toilet time to 2-3 minutes at the most.   Continue to avoid constipation.  Please call me with any concerns or issues!  I have sent in a cream to [facility] called ketoconazole. You can use this twice a day per rectum for burning/itching. Let me know if not helpful!  I enjoyed seeing you again today! As you know, I value our relationship and want to provide genuine, compassionate, and quality care. I welcome your feedback. If you receive a survey regarding your visit,  I greatly appreciate you taking time to fill this out. See you next time!  Annitta Needs, PhD, ANP-BC [facility] \

## 2022-02-15 NOTE — Progress Notes (Signed)
    [place] BANDING PROCEDURE NOTE  [name] [name] is a 81 y.o. male presenting today for consideration of hemorrhoid banding. Last colonoscopy by Dr. Jenetta Downer with sigmoid diverticulosis and hemorrhoids. Main complaints of itching and burning. We have banded left lateral, right posterior, and neutrally.   The patient presents with symptomatic grade 1 hemorrhoids, unresponsive to maximal medical therapy, requesting rubber band ligation of his hemorrhoidal disease. All risks, benefits, and alternative forms of therapy were described and informed consent was obtained.  In the left lateral decubitus position, anoscopic examination revealed grade 1 hemorrhoids in the right posterior position.  The decision was made to band the right posterior internal hemorrhoid, and the [place] was used to perform band ligation without complication. Digital anorectal examination was then performed to assure proper positioning of the band, and to adjust the banded tissue as required. The patient was discharged home without pain or other issues. Dietary and behavioral recommendations were given, along with follow-up instructions. The patient will return as needed for follow-up. I query if he may have a yeast-type etiology for itching. I have sent ketoconazole to use twice a day to his pharmacy along with anusol for symptomatic treatment.   No complications were encountered and the patient tolerated the procedure well.   Annitta Needs, PhD, ANP-BC [facility]

## 2022-02-20 DIAGNOSIS — E118 Type 2 diabetes mellitus with unspecified complications: Secondary | ICD-10-CM | POA: Diagnosis not present

## 2022-02-20 DIAGNOSIS — E782 Mixed hyperlipidemia: Secondary | ICD-10-CM | POA: Diagnosis not present

## 2022-02-20 DIAGNOSIS — F5104 Psychophysiologic insomnia: Secondary | ICD-10-CM | POA: Diagnosis not present

## 2022-02-20 DIAGNOSIS — E039 Hypothyroidism, unspecified: Secondary | ICD-10-CM | POA: Diagnosis not present

## 2022-02-20 DIAGNOSIS — H6122 Impacted cerumen, left ear: Secondary | ICD-10-CM | POA: Diagnosis not present

## 2022-02-27 ENCOUNTER — Encounter: Payer: Self-pay | Admitting: Internal Medicine

## 2022-02-27 DIAGNOSIS — E118 Type 2 diabetes mellitus with unspecified complications: Secondary | ICD-10-CM | POA: Diagnosis not present

## 2022-02-27 DIAGNOSIS — G4700 Insomnia, unspecified: Secondary | ICD-10-CM | POA: Diagnosis not present

## 2022-02-27 DIAGNOSIS — I129 Hypertensive chronic kidney disease with stage 1 through stage 4 chronic kidney disease, or unspecified chronic kidney disease: Secondary | ICD-10-CM | POA: Diagnosis not present

## 2022-02-27 DIAGNOSIS — E039 Hypothyroidism, unspecified: Secondary | ICD-10-CM | POA: Diagnosis not present

## 2022-02-27 DIAGNOSIS — H6122 Impacted cerumen, left ear: Secondary | ICD-10-CM | POA: Diagnosis not present

## 2022-02-27 DIAGNOSIS — G4733 Obstructive sleep apnea (adult) (pediatric): Secondary | ICD-10-CM | POA: Diagnosis not present

## 2022-02-27 DIAGNOSIS — N182 Chronic kidney disease, stage 2 (mild): Secondary | ICD-10-CM | POA: Diagnosis not present

## 2022-02-27 DIAGNOSIS — E782 Mixed hyperlipidemia: Secondary | ICD-10-CM | POA: Diagnosis not present

## 2022-02-27 DIAGNOSIS — I2510 Atherosclerotic heart disease of native coronary artery without angina pectoris: Secondary | ICD-10-CM | POA: Diagnosis not present

## 2022-03-09 ENCOUNTER — Ambulatory Visit (INDEPENDENT_AMBULATORY_CARE_PROVIDER_SITE_OTHER): Payer: Medicare HMO | Admitting: Gastroenterology

## 2022-03-09 ENCOUNTER — Encounter (INDEPENDENT_AMBULATORY_CARE_PROVIDER_SITE_OTHER): Payer: Self-pay | Admitting: Gastroenterology

## 2022-03-09 VITALS — BP 144/87 | HR 69 | Temp 97.6°F | Ht 73.0 in | Wt 215.4 lb

## 2022-03-09 DIAGNOSIS — K640 First degree hemorrhoids: Secondary | ICD-10-CM | POA: Diagnosis not present

## 2022-03-09 DIAGNOSIS — R208 Other disturbances of skin sensation: Secondary | ICD-10-CM | POA: Diagnosis not present

## 2022-03-09 MED ORDER — ZINC OXIDE 30 % EX OINT: 1.0000 | TOPICAL_OINTMENT | Freq: Four times a day (QID) | CUTANEOUS | 1 refills | Status: DC

## 2022-03-09 NOTE — Patient Instructions (Signed)
I have sent some cream for you to apply to rectal area 4 times per day Keep the area clean with only water and dry, do not use any soaps or wet wipes on the area Make sure to keep stools nice and soft to avoid straining    Follow up 1 month

## 2022-03-09 NOTE — Progress Notes (Signed)
Referring Provider: Celene Squibb, MD Primary Care Physician:  Celene Squibb, MD Primary GI Physician:   Chief Complaint  Patient presents with   Hemorrhoids    Follow up on hemorrhoids. States he has been banded 4 times and has not helped. Having burning pain. Has not seen any bleeding.    HPI:   [name] [name] is a 81 y.o. male with past medical history of  RLS, thyroid disease, HLD, OSA, HTN, CKD, prediabetes,   Patient presenting today for follow up of hemorrhoids.   Last seen 11/08/21 at that time,reported previous ectal exam with PCP and was told he had hemorrhoids. Previously using preparation H, PCP sent a topical cream from [employer], however, had not noticed any improvement from this.  having burning, itching and some discomfort in his rectal area after having a BM. Denies any rectal bleeding. Denies any severe rectal pain. Also Endorsed some occasional nausea, usually noticed more after eating certain foods. No vomiting. BMs are very sporadic. May have one for a few days in a row and then may go 2-3 days without one.  using magnesium citrate, just a few oz once every few days.  not GERD well controlled on Nexium once daily without breakthrough symptoms. scheduled for colonoscopy, Rx anusol, recommended miralax, sitz baths, tux pads, increased water intake.   Referred for hemorrhoid banding after colonoscopy. Has had banding x4 for grade I hemorrhoids with Roseanne Kaufman, NP. Was given Nizoral previously as well by NP Cyndi Bender for considerations of fungal infection as cause of rectal burning.   Today, States that he is having a BM every other day that are softer, no straining, he reports that he is taking miralax every other day. He states that he has used nizoral and anusol cream without any relief. Denies any rectal pain or bleeding but has rectal burning worse after his BMs. Pain has been present x4 months now. Even had anuscope at last hemorrhoid banding visit without any  findings of other causes of his symptoms. He is using tux pads after he has a BM. He denies any other the counter wet wipes, he does endorse that he uses TP, then a wash cloth with hot water and then tux pads. He does not notice worsening symptoms with certain foods.   He also endorses quite a bit of flatulence, he is able to pass it without issue. He does endorse diet high in veggies and beans.    Last Colonoscopy:11/29/21- Diverticulosis in the sigmoid colon. - Non-bleeding internal hemorrhoids. - No specimens collected. Last Endoscopy:never    Past Medical History:  Diagnosis Date   Arthritis    CAD (coronary artery disease)    CAD (coronary artery disease) 12/04/2011   Left Main Disease with 3- vessel CAD    CAD (coronary artery disease)    seeing Dr Harl Bowie   CHF (congestive heart failure) ([place])    Deviated septum    Dyslipidemia    GERD (gastroesophageal reflux disease)    Heart attack ([place])    Hyperlipidemia    Hypertension    Hypothyroidism    MI (myocardial infarction) ([place]) 1997   Neuropathy    Obesity (BMI 30-39.9) 09/02/2018   PONV (postoperative nausea and vomiting)    has in past, no problems after most recent surgery   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   LIMA to LAD, SVG to OM2, SVG to PDA, EVH via right thigh   S/P carotid endarterectomy 02/29/2012   Right CEA  for asymptomatic severe right ICA stenosis    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   Left Ear Rim (Cx3,5FU)   Shortness of breath    with exertion   Sleep apnea    CPAP, sleep [facility] at [facility]   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   80-99% stenosis RICA, asymptomatic   Thyroid nodule    Wears glasses    or contacts    Past Surgical History:  Procedure Laterality Date   ADENOIDECTOMY     CARDIAC CATHETERIZATION     02/22/12   CARDIOVASCULAR STRESS TEST  7/13   CAROTID ENDARTERECTOMY     COLONOSCOPY  15-20years and 12/04/11   Done at [facility]  12/11/2011   Procedure:  COLONOSCOPY;  Surgeon: Rogene Houston, MD;  Location[facility];  Service: Endoscopy;  Laterality: N/A;  830   COLONOSCOPY WITH PROPOFOL N/A 11/29/2021   Procedure: COLONOSCOPY WITH PROPOFOL;  Surgeon: Harvel Quale, MD;  Location: [facility];  Service: Gastroenterology;  Laterality: N/A;  1105 ASA 2   CORONARY ARTERY BYPASS GRAFT  02/29/2012   Procedure: CORONARY ARTERY BYPASS GRAFTING (CABG);  Surgeon: Rexene Alberts, MD;  Location: [facility];  Service: Open Heart Surgery;  Laterality: N/A;  times three using Left Internal Mammary Artery and Right Greater Saphenous Vein Graft Harvested Endoscopically   CORONARY STENT PLACEMENT     ENDARTERECTOMY  02/29/2012   Procedure: ENDARTERECTOMY CAROTID;  Surgeon: Angelia Mould, MD;  Location: [facility];  Service: Vascular;  Laterality: Right;   FOOT SURGERY  06/30/11   artificial joint in big toe right foot    KNEE ARTHROSCOPY WITH MEDIAL MENISECTOMY Left 07/15/2019   Procedure: KNEE ARTHROSCOPY WITH MEDIAL MENISCECTOMY AND LATERAL MENISCECTOMY;  Surgeon: Carole Civil, MD;  Location: [facility];  Service: Orthopedics;  Laterality: Left;   LEFT HEART CATHETERIZATION WITH CORONARY ANGIOGRAM N/A 02/20/2012   Procedure: LEFT HEART CATHETERIZATION WITH CORONARY ANGIOGRAM;  Surgeon: Troy Sine, MD;  [facility]: [facility] C[facility] LAB;  Service: Cardiovascular;  Laterality: N/A;   NASAL SEPTUM SURGERY     Totol thyroidectomy  02/06/2011   TRANSESOPHAGEAL ECHOCARDIOGRAM     UPPER GASTROINTESTINAL ENDOSCOPY  35-40 years ago   [facility]    Current Outpatient Medications  Medication Sig Dispense Refill   amLODipine (NORVASC) 5 MG tablet Take 1 tablet (5 mg total) by mouth daily. 90 tablet 2   aspirin EC 81 MG tablet Take 81 mg by mouth daily.     atorvastatin (LIPITOR) 80 MG tablet Take 1 tablet (80 mg total) by mouth daily. 90 tablet 3   diclofenac (VOLTAREN) 75 MG EC tablet TAKE (1) TABLET BY MOUTH TWICE DAILY. 60 tablet 0    diphenhydrAMINE-APAP, sleep, (TYLENOL PM EXTRA STRENGTH PO) Take by mouth. One qhs prn     DULoxetine (CYMBALTA) 30 MG capsule Take 1 capsule (30 mg total) by mouth daily. 30 capsule 3   esomeprazole (NEXIUM) 20 MG capsule Take 20 mg by mouth daily.      metoprolol (LOPRESSOR) 50 MG tablet Take 1 tablet (50 mg total) by mouth 2 (two) times daily. 180 tablet 1   Multiple Vitamins-Minerals (PRESERVISION AREDS 2 PO) Take 1 tablet by mouth 2 (two) times daily.      Omega-3 Fatty Acids (FISH OIL) 1000 MG CPDR Take 1,000 mg by mouth 2 (two) times daily.      OVER THE COUNTER MEDICATION Take 3 capsules by mouth daily. Balance of [place]ra [place] (  Fruits and Vegetables)     ramipril (ALTACE) 10 MG capsule Take 10 mg by mouth daily.     SYNTHROID 112 MCG tablet Take 112 mcg by mouth daily before breakfast.      zolpidem (AMBIEN) 10 MG tablet Take 1 tablet (10 mg total) by mouth at bedtime. 30 tablet 5   hydrocortisone (ANUSOL-HC) 2.5 % rectal cream Place 1 application. rectally 3 (three) times daily. Use three times a day for 10 days then as needed thereafter. (Patient not taking: Reported on 03/09/2022) 60 g 1   hydrocortisone (ANUSOL-HC) 2.5 % rectal cream Place 1 Application rectally 2 (two) times daily. (Patient not taking: Reported on 03/09/2022) 30 g 1   ketoconazole (NIZORAL) 2 % cream Apply 1 Application topically 2 (two) times daily. To rectum (Patient not taking: Reported on 03/09/2022) 30 g 0   traZODone (DESYREL) 100 MG tablet Take 1 tablet (100 mg total) by mouth at bedtime. (Patient not taking: Reported on 02/15/2022) 30 tablet 5   No current facility-administered medications for this visit.    Allergies as of 03/09/2022   (No Known Allergies)    Family History  Problem Relation Age of Onset   Hypertension Mother    Healthy Sister    Healthy Brother    Healthy Son    Heart attack Father    Heart disease Father        Before age 57   Hyperlipidemia Father    Hypertension Father    Prostate  cancer Father    Colon cancer Neg Hx     Social History   Socioeconomic History   Marital status: Married    Spouse name: Velva Harman   Number of children: 1   Years of education: 12   Highest education level: Not on file  Occupational History    Comment: retired  Tobacco Use   Smoking status: Former    Packs/day: 0.50    Years: 15.00    Total pack years: 7.50    Types: Cigarettes    Quit date: 09/04/2001    Years since quitting: 20.5    Passive exposure: Current   Smokeless tobacco: Never   Tobacco comments:    smoked 64yr, smoked 1/2 pack a day  Vaping Use   Vaping Use: Never used  Substance and Sexual Activity   Alcohol use: Not Currently    Alcohol/week: 3.0 - 4.0 standard drinks of alcohol    Types: 3 - 4 Cans of beer per week    Comment: social- beer , not on regular basis   Drug use: No   Sexual activity: Yes  Other Topics Concern   Not on file  Social History Narrative   Consumes 2-3 cups of caffeine daily   Right handed   Lives in a one story home with a basement    Social Determinants of Health   Financial Resource Strain: Not on file  Food Insecurity: Not on file  Transportation Needs: Not on file  Physical Activity: Not on file  Stress: Not on file  Social Connections: Not on file   Review of systems General: negative for malaise, night sweats, fever, chills, weight loss Neck: Negative for lumps, goiter, pain and significant neck swelling Resp: Negative for cough, wheezing, dyspnea at rest CV: Negative for chest pain, leg swelling, palpitations, orthopnea GI: denies melena, hematochezia, nausea, vomiting, diarrhea, constipation, dysphagia, odyonophagia, early satiety or unintentional weight loss. +rectal burning MSK: Negative for joint pain or swelling, back pain, and muscle pain.  Derm: Negative for itching or rash Psych: Denies depression, anxiety, memory loss, confusion. No homicidal or suicidal ideation.  Heme: Negative for prolonged bleeding,  bruising easily, and swollen nodes. Endocrine: Negative for cold or heat intolerance, polyuria, polydipsia and goiter. Neuro: negative for tremor, gait imbalance, syncope and seizures. The remainder of the review of systems is noncontributory.  Physical Exam: BP (!) 144/87 (BP Location: Right Arm, Patient Position: Sitting, Cuff Size: Large)   Pulse 69   Temp 97.6 F (36.4 C) (Oral)   Ht '6\' 1"'$  (1.854 m)   Wt 215 lb 6.4 oz (97.7 kg)   BMI 28.42 kg/m  General:   Alert and oriented. No distress noted. Pleasant and cooperative.  Head:  Normocephalic and atraumatic. Eyes:  Conjuctiva clear without scleral icterus. Mouth:  Oral mucosa pink and moist. Good dentition. No lesions. Heart: Normal rate and rhythm, s1 and s2 heart sounds present.  Lungs: Clear lung sounds in all lobes. Respirations equal and unlabored. Abdomen:  +BS, soft, non-tender and non-distended. No rebound or guarding. No HSM or masses noted. Derm: No palmar erythema or jaundice Msk:  Symmetrical without gross deformities. Normal posture. Extremities:  Without edema. Neurologic:  Alert and  oriented x4 Psych:  Alert and cooperative. Normal mood and affect.  Invalid input(s): "6 MONTHS"   ASSESSMENT: Kenji D Glendening is a 81 y.o. male presenting today for ongoing rectal burning.  Previously treated with hemorrhoid banding x4, anusol and nizoral for rectal burning without pain or bleeding, however, he notes no relief in his symptoms. Recent colonoscopy in April 2023 without any findings to suggest reason for his symptoms other than internal hemorrhoids. Multiple previous rectal exams without lesions, fissures or obvious excoriation, however, query if area is irritated by over cleansing at this point. Advised him to avoid soaps in rectal area and use only warm water to clean the area. He should continue to keep stools soft and avoid straining and can use topic zinc oxide ointment QID. Will re evaluate in about a month, if no  improvement, may consider dermatology referral for further evaluation of his symptoms as it's possible this is more of a skin issue than GI.   PLAN:  1.zinc oxide oitnment QID 2. Avoid soap in rectal area, clean only with warm water 3. Keep stools soft, avoid straining and limit toilet time to <5  All questions were answered, patient verbalized understanding and is in agreement with plan as outlined above.   Follow Up: 1 month  Tamyka Bezio L. Alver Sorrow, MSN, APRN, AGNP-C Adult-Gerontology Nurse Practitioner [facility]

## 2022-03-10 DIAGNOSIS — R208 Other disturbances of skin sensation: Secondary | ICD-10-CM | POA: Insufficient documentation

## 2022-03-23 ENCOUNTER — Ambulatory Visit: Payer: Medicare HMO | Admitting: Physician Assistant

## 2022-04-06 ENCOUNTER — Encounter (INDEPENDENT_AMBULATORY_CARE_PROVIDER_SITE_OTHER): Payer: Self-pay | Admitting: Gastroenterology

## 2022-04-06 ENCOUNTER — Ambulatory Visit (INDEPENDENT_AMBULATORY_CARE_PROVIDER_SITE_OTHER): Payer: Medicare HMO | Admitting: Gastroenterology

## 2022-04-06 VITALS — BP 157/91 | HR 68 | Temp 98.3°F | Ht 72.0 in | Wt 214.4 lb

## 2022-04-06 DIAGNOSIS — K640 First degree hemorrhoids: Secondary | ICD-10-CM

## 2022-04-06 DIAGNOSIS — R208 Other disturbances of skin sensation: Secondary | ICD-10-CM | POA: Diagnosis not present

## 2022-04-06 NOTE — Patient Instructions (Signed)
Please continue to avoid straining, keep rectal area clean and dry I will discuss your symptoms further with Dr. Jenetta Downer, however, as discussed, we may try you on a low dose anti depressant as these can sometimes help with "functional" pain that no cause can be determined.  Follow up 3 months

## 2022-04-06 NOTE — Progress Notes (Signed)
Referring Provider: Celene Squibb, MD Primary Care Physician:  Celene Squibb, MD Primary GI Physician:   Chief Complaint  Patient presents with   Hemorrhoids    Patient here today due to issues with Hemorrhoids. He says he has had four bandings in the past and they all have failed. He says [facility] preformed the bandings. He is having a burning sensation in rectum for the last six months. He has not seen any sight of blood or dark stools.    HPI:   [name] [name] is a 81 y.o. male with past medical history of RLS, thyroid disease, HLD, OSA, HTN, CKD, prediabetes,   Patient here for follow up of hemorrhoids/rectal burning.  Last seen 03/09/22   At that time, having a BM every other day that are softer, no straining, he reports that he is taking miralax every other day. He states that he has used nizoral and anusol cream without any relief. Denies any rectal pain or bleeding but has rectal burning worse after his BMs. Pain has been present x4 months now. Even had anuscope at last hemorrhoid banding visit without any findings of other causes of his symptoms. He is using tux pads after he has a BM. He denies any other the counter wet wipes, he does endorse that he uses TP, then a wash cloth with hot water and then tux pads. He does not notice worsening symptoms with certain foods. Previous hemorrhoid banding x4 for grade I hemorrhoids. Recommended to use zinc oxide ointment QID, keep rectal area clean and dry using warm water only, and continue to keep stools soft, avoid straining.   Today,  Continues to have rectal burning that is constant, states that it gets worse after he has a BM. He is having a BM every other day. No straining required. Denies rectal bleeding or melena. He is not using any wet wipes now, using warm water and keeping area clean and dry. Using zinc oxide TID without good relief. Has tried nizoral as well without relief. He does report he has a pinched nerve in his  back. Denies any fecal incontinence, abdominal pain, weight loss or changes in appetite. No saddle paresthesias.   Last Colonoscopy:11/29/21- Diverticulosis in the sigmoid colon. - Non-bleeding internal hemorrhoids. - No specimens collected. Last Endoscopy:never    Past Medical History:  Diagnosis Date   Arthritis    CAD (coronary artery disease)    CAD (coronary artery disease) 12/04/2011   Left Main Disease with 3- vessel CAD    CAD (coronary artery disease)    seeing Dr Harl Bowie   CHF (congestive heart failure) ([place])    Deviated septum    Dyslipidemia    GERD (gastroesophageal reflux disease)    Heart attack ([place])    Hyperlipidemia    Hypertension    Hypothyroidism    MI (myocardial infarction) ([place]) 1997   Neuropathy    Obesity (BMI 30-39.9) 09/02/2018   PONV (postoperative nausea and vomiting)    has in past, no problems after most recent surgery   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   80-99% stenosis RICA, asymptomatic   Thyroid nodule    Wears glasses    or contacts    Past Surgical History:  Procedure Laterality Date   ADENOIDECTOMY     CARDIAC CATHETERIZATION     02/22/12   CARDIOVASCULAR STRESS TEST  7/13   CAROTID ENDARTERECTOMY     COLONOSCOPY  15-20years and 12/04/11   Done at [facility]  12/11/2011   Procedure: COLONOSCOPY;  Surgeon: Rogene Houston, MD;  [facility];  Service: Endoscopy;  Laterality: N/A;  830   COLONOSCOPY WITH PROPOFOL N/A 11/29/2021   Procedure: COLONOSCOPY WITH PROPOFOL;  Surgeon: Harvel Quale, MD;  Location: [facility];  Service: Gastroenterology;   Laterality: N/A;  1105 ASA 2   CORONARY ARTERY BYPASS GRAFT  02/29/2012   Procedure: CORONARY ARTERY BYPASS GRAFTING (CABG);  Surgeon: Rexene Alberts, MD;  Location: [facility];  Service: Open Heart Surgery;  Laterality: N/A;  times three using Left Internal Mammary Artery and Right Greater Saphenous Vein Graft Harvested Endoscopically   CORONARY STENT PLACEMENT     ENDARTERECTOMY  02/29/2012   Procedure: ENDARTERECTOMY CAROTID;  Surgeon: Angelia Mould, MD;  Location: [facility];  Service: Vascular;  Laterality: Right;   FOOT SURGERY  06/30/11   artificial joint in big toe right foot    KNEE ARTHROSCOPY WITH MEDIAL MENISECTOMY Left 07/15/2019   Procedure: KNEE ARTHROSCOPY WITH MEDIAL MENISCECTOMY AND LATERAL MENISCECTOMY;  Surgeon: Carole Civil, MD;  Location: [facility];  Service: Orthopedics;  Laterality: Left;   LEFT HEART CATHETERIZATION WITH CORONARY ANGIOGRAM N/A 02/20/2012   Procedure: LEFT HEART CATHETERIZATION WITH CORONARY ANGIOGRAM;  Surgeon: Troy Sine, MD;  Location: [facility] CATH LAB;  Service: Cardiovascular;  Laterality: N/A;   NASAL SEPTUM SURGERY     Totol thyroidectomy  02/06/2011   TRANSESOPHAGEAL ECHOCARDIOGRAM     UPPER GASTROINTESTINAL ENDOSCOPY  35-40 years ago   [facility]    Current Outpatient Medications  Medication Sig Dispense Refill   amLODipine (NORVASC) 5 MG tablet Take 1 tablet (5 mg total) by mouth daily. 90 tablet 2   aspirin EC 81 MG tablet Take 81 mg by mouth daily.     atorvastatin (LIPITOR) 80 MG tablet Take 1 tablet (80 mg total) by mouth daily. 90 tablet 3   diclofenac (VOLTAREN) 75 MG EC tablet TAKE (1) TABLET BY MOUTH TWICE DAILY. 60 tablet 0   diphenhydrAMINE-APAP, sleep, (TYLENOL PM EXTRA STRENGTH PO) Take by mouth. One qhs prn     DULoxetine (CYMBALTA) 30 MG capsule Take 1 capsule (30 mg total) by mouth daily. 30 capsule 3   esomeprazole (NEXIUM) 20 MG capsule Take 20 mg by mouth daily.      ketoconazole (NIZORAL) 2 % cream Apply 1 Application  topically 2 (two) times daily. To rectum 30 g 0   metoprolol (LOPRESSOR) 50 MG tablet Take 1 tablet (50 mg total) by mouth 2 (two) times daily. 180 tablet 1   Multiple Vitamins-Minerals (PRESERVISION AREDS 2 PO) Take 1 tablet by mouth 2 (two) times daily.      Omega-3 Fatty Acids (FISH OIL) 1000 MG CPDR Take 1,000 mg by mouth 2 (two) times daily.      OVER THE COUNTER MEDICATION Take 3 capsules by mouth daily. Balance of Nature (Fruits and Vegetables)     ramipril (ALTACE) 10 MG capsule Take 10 mg by mouth daily.     SYNTHROID 112  MCG tablet Take 112 mcg by mouth daily before breakfast.      Zinc Oxide 30 % OINT Apply 1 Application topically 4 (four) times daily. (Patient taking differently: Apply 1 Application topically 3 (three) times daily.) 57 g 1   zolpidem (AMBIEN) 10 MG tablet Take 1 tablet (10 mg total) by mouth at bedtime. 30 tablet 5   hydrocortisone (ANUSOL-HC) 2.5 % rectal cream Place 1 application. rectally 3 (three) times daily. Use three times a day for 10 days then as needed thereafter. (Patient not taking: Reported on 03/09/2022) 60 g 1   hydrocortisone (ANUSOL-HC) 2.5 % rectal cream Place 1 Application rectally 2 (two) times daily. (Patient not taking: Reported on 03/09/2022) 30 g 1   No current facility-administered medications for this visit.    Allergies as of 04/06/2022   (No Known Allergies)    Family History  Problem Relation Age of Onset   Hypertension Mother    Healthy Sister    Healthy Brother    Healthy Son    Heart attack Father    Heart disease Father        Before age 32   Hyperlipidemia Father    Hypertension Father    Prostate cancer Father    Colon cancer Neg Hx     Social History   Socioeconomic History   Marital status: Married    Spouse name: Velva Harman   Number of children: 1   Years of education: 12   Highest education level: Not on file  Occupational History    Comment: retired  Tobacco Use   Smoking status: Former    Packs/day: 0.50     Years: 15.00    Total pack years: 7.50    Types: Cigarettes    Quit date: 09/04/2001    Years since quitting: 20.6    Passive exposure: Current   Smokeless tobacco: Never   Tobacco comments:    smoked 40yr, smoked 1/2 pack a day  Vaping Use   Vaping Use: Never used  Substance and Sexual Activity   Alcohol use: Not Currently    Alcohol/week: 3.0 - 4.0 standard drinks of alcohol    Types: 3 - 4 Cans of beer per week    Comment: social- beer , not on regular basis   Drug use: No   Sexual activity: Yes  Other Topics Concern   Not on file  Social History Narrative   Consumes 2-3 cups of caffeine daily   Right handed   Lives in a one story home with a basement    Social Determinants of Health   Financial Resource Strain: Not on file  Food Insecurity: Not on file  Transportation Needs: Not on file  Physical Activity: Not on file  Stress: Not on file  Social Connections: Not on file   Review of systems General: negative for malaise, night sweats, fever, chills, weight loss Neck: Negative for lumps, goiter, pain and significant neck swelling Resp: Negative for cough, wheezing, dyspnea at rest CV: Negative for chest pain, leg swelling, palpitations, orthopnea GI: denies melena, hematochezia, nausea, vomiting, diarrhea, constipation, dysphagia, odyonophagia, early satiety or unintentional weight loss. +burning in rectum MSK: Negative for joint pain or swelling, back pain, and muscle pain. Derm: Negative for itching or rash Psych: Denies depression, anxiety, memory loss, confusion. No homicidal or suicidal ideation.  Heme: Negative for prolonged bleeding, bruising easily, and swollen nodes. Endocrine: Negative for cold or heat intolerance, polyuria, polydipsia and goiter. Neuro: negative for tremor, gait imbalance,  syncope and seizures. The remainder of the review of systems is noncontributory.  Physical Exam: BP (!) 157/91 (BP Location: Left Arm, Patient Position: Sitting, Cuff  Size: Large)   Pulse 68   Temp 98.3 F (36.8 C) (Oral)   Ht 6' (1.829 m)   Wt 214 lb 6.4 oz (97.3 kg)   BMI 29.08 kg/m  General:   Alert and oriented. No distress noted. Pleasant and cooperative.  Head:  Normocephalic and atraumatic. Eyes:  Conjuctiva clear without scleral icterus. Mouth:  Oral mucosa pink and moist. Good dentition. No lesions. Heart: Normal rate and rhythm, s1 and s2 heart sounds present.  Lungs: Clear lung sounds in all lobes. Respirations equal and unlabored. Abdomen:  +BS, soft, non-tender and non-distended. No rebound or guarding. No HSM or masses noted. Derm: No palmar erythema or jaundice Msk:  Symmetrical without gross deformities. Normal posture. Extremities:  Without edema. Neurologic:  Alert and  oriented x4 Psych:  Alert and cooperative. Normal mood and affect.  Invalid input(s): "6 MONTHS"   ASSESSMENT: [name] [name] is a 81 y.o. male presenting today for ongoing rectal burning.  Patient with ongoing rectal burning, constant, worse with BMs. Denies rectal bleeding, melena, abdominal pain, weight loss or changes in appetite, recent TCS in April 2023 with internal hemorrhoids. Has had hemorrhoid banding x4 as well as treatment with antifungal and topical barrier without any change. Does report history of pinched nerve in his back, this could possibly be contributing though he has no fecal incontinence or any type of saddle parasthesia. Will discuss case further with Dr. Jenetta Downer, could potentially try a low dose TCA to see if this is functional pain that my be improved with anti depressant therapy.  Patient declined rectal exam today.   No red flag symptoms. Patient denies melena, hematochezia, nausea, vomiting, diarrhea, constipation, dysphagia, odyonophagia, early satiety or weight loss.    PLAN:  Continue to avoid straining  2. Keep stools soft  3. Keep rectal area clean and dry 4. Consider low dose TCA for rectal burning  All questions were  answered, patient verbalized understanding and is in agreement with plan as outlined above.    Follow Up: 3 months   Miho Monda L. Alver Sorrow, MSN, APRN, AGNP-C Adult-Gerontology Nurse Practitioner [facility]

## 2022-04-12 ENCOUNTER — Other Ambulatory Visit (INDEPENDENT_AMBULATORY_CARE_PROVIDER_SITE_OTHER): Payer: Self-pay | Admitting: Gastroenterology

## 2022-04-12 ENCOUNTER — Telehealth (INDEPENDENT_AMBULATORY_CARE_PROVIDER_SITE_OTHER): Payer: Self-pay | Admitting: Gastroenterology

## 2022-04-12 NOTE — Telephone Encounter (Signed)
I called and left a message asked that the patient please return call to the office to discuss the Tricyclic Antidepressant.

## 2022-04-13 NOTE — Telephone Encounter (Signed)
Patient made aware of all.  

## 2022-04-13 NOTE — Telephone Encounter (Signed)
Patient left message on voicemail that he was returning call and left number [phone] to call him back on. I tried to call patient back and left him another message to return call

## 2022-04-13 NOTE — Telephone Encounter (Signed)
When I talked with patient he wanted me to ask you if you talked with the doctor here in the office about his case.

## 2022-04-13 NOTE — Telephone Encounter (Signed)
Discussed with patient per [facility] - the patient is actually already on cymbalta, given his age, it would be best not to combine these medications, can we just let the patient know that since he is already on cymbalta it may not be safe for [place] to start him on a TCA type anti depressant for his rectal pain due to interactions, he could potentially discuss a change in therapy with the provider who manages his cymbalta to see if they thought a TCA could be used instead to help with his depression and rectal pain.

## 2022-04-25 DIAGNOSIS — Z1283 Encounter for screening for malignant neoplasm of skin: Secondary | ICD-10-CM | POA: Diagnosis not present

## 2022-04-25 DIAGNOSIS — D225 Melanocytic nevi of trunk: Secondary | ICD-10-CM | POA: Diagnosis not present

## 2022-04-25 DIAGNOSIS — X32XXXA Exposure to sunlight, initial encounter: Secondary | ICD-10-CM | POA: Diagnosis not present

## 2022-04-25 DIAGNOSIS — L570 Actinic keratosis: Secondary | ICD-10-CM | POA: Diagnosis not present

## 2022-05-18 ENCOUNTER — Telehealth: Payer: Self-pay | Admitting: Pulmonary Disease

## 2022-05-18 NOTE — Telephone Encounter (Signed)
Called and spoke to patient. He is agreeable to come in tomorrow afternoon. Scheduled for 2:00. Nothing further needed

## 2022-05-18 NOTE — Telephone Encounter (Signed)
Pt came in the office requesting an appt to discuss changing medications.  No available appt in [place] or [place].  Please advise.      Dr. Halford Chessman please advise patient wants to be seen sooner than November

## 2022-05-18 NOTE — Telephone Encounter (Signed)
Can he come to [place] office in the afternoon on 05/19/22?

## 2022-05-19 ENCOUNTER — Ambulatory Visit (INDEPENDENT_AMBULATORY_CARE_PROVIDER_SITE_OTHER): Payer: Medicare HMO | Admitting: Pulmonary Disease

## 2022-05-19 ENCOUNTER — Encounter: Payer: Self-pay | Admitting: Pulmonary Disease

## 2022-05-19 VITALS — BP 132/84 | HR 58 | Temp 98.7°F | Ht 72.0 in | Wt 215.2 lb

## 2022-05-19 DIAGNOSIS — G4733 Obstructive sleep apnea (adult) (pediatric): Secondary | ICD-10-CM | POA: Diagnosis not present

## 2022-05-19 DIAGNOSIS — F5101 Primary insomnia: Secondary | ICD-10-CM

## 2022-05-19 MED ORDER — ZOLPIDEM TARTRATE 10 MG PO TABS: 10.0000 mg | ORAL_TABLET | Freq: Every day | ORAL | 5 refills | Status: DC

## 2022-05-19 MED ORDER — RAMELTEON 8 MG PO TABS: 8.0000 mg | ORAL_TABLET | Freq: Every day | ORAL | 5 refills | Status: DC

## 2022-05-19 NOTE — Progress Notes (Signed)
[name] [name], [name] [name], [name] [name]  Chief Complaint  Patient presents with   Follow-up    Wants to discuss sleeping medications     Past Surgical History:  He  has a past surgical history that includes Coronary stent placement; Nasal septum surgery; Totol thyroidectomy (02/06/2011); Foot surgery (06/30/11); [name] (15-20years [name] 12/04/11); Upper gastrointestinal endoscopy (35-40 years ago); [name] (12/11/2011); Cardiac catheterization; Adenoidectomy; Cardiovascular stress test (7/13); transesophageal echocardiogram; Coronary artery bypass graft (02/29/2012); Endarterectomy (02/29/2012); Carotid endarterectomy; left heart catheterization with coronary angiogram (N/A, 02/20/2012); Knee arthroscopy with medial menisectomy (Left, 07/15/2019); [name] [name] with propofol (N/A, 11/29/2021).  Past Medical History:  OA, CAD, CHF, HLD, GERD, HTN, Hypothyroidism, Lumbar radiculopathy with b/l leg parasthesias, Carpal tunnel syndrome  Constitutional:  BP 132/84 (BP Location: Left Arm, Patient Position: Sitting)   Pulse (!) 58   Temp 98.7 F (37.1 C) (Temporal)   Ht 6' (1.829 m)   Wt 215 lb 3.2 oz (97.6 kg)   SpO2 96% Comment: ra  BMI 29.19 kg/m   Brief Summary:  [name] [name] is a 81 y.o. male former smoker with obstructive sleep apnea [name] insomnia.      Subjective:   He tried trazodone, but didn't help.  He tried taking two zolpidem.  This helped him sleep.  He was told by his wife that was crawling on the floor.  He doesn't remember doing this.  He is getting about 4 hours sleep at night.  No issues with using CPAP.  Physical Exam:   Appearance - well kempt   ENMT - no sinus tenderness, no oral exudate, no LAN, Mallampati 4 airway, no stridor  Respiratory - equal breath sounds bilaterally, no wheezing or rales  CV - s1s2 regular rate [name] rhythm, no murmurs  Ext - no clubbing, no edema  Skin - no rashes  Psych - normal mood [name] affect    [name] testing:  Spirometry 02/27/12 >>  FEV1 3.11 (84%), FEV1% 75  Sleep Tests:  PSG 08/22/12 >> AHI 8.3, SpO2 low 88% CPAP titration 10/01/12 >> CPAP 12 cm H2O PSG 12/06/14 >> AHI 3.7, SpO2 low 86% CPAP titration 09/24/18 >> CPAP 10 cm H2O CPAP 12/21/21 to 01/19/22 >> used on 30 of 30 nights with average 8 hrs 34 min.  Average AHI 0.8 with CPAP 10 cm H2O  Cardiac Tests:  Echo 03/26/13 >> EF 55 to 60%, grade 1 DD, mild MR, mild LA dilation  Social History:  He  reports that he quit smoking about 20 years ago. His smoking use included cigarettes. He has a 7.50 pack-year smoking history. He has been exposed to tobacco smoke. He has never used smokeless tobacco. He reports that he does not currently use alcohol after a past usage of about 3.0 - 4.0 standard drinks of alcohol per week. He reports that he does not use drugs.  Family History:  His family history includes Healthy in his brother, sister, [name] son; Heart attack in his father; Heart disease in his father; Hyperlipidemia in his father; Hypertension in his father [name] mother; Prostate cancer in his father.     Assessment/Plan:   Obstructive sleep apnea. - he is compliant with CPAP [name] reports benefit from therapy - he uses [employer] for his DME - continue CPAP 10 cm H2O  Insomnia. - continue sleep restriction, relaxation techniques [name] stimulus control - belsomra, trazodone [name] doxepin were ineffective - continue zolpidem 10 mg nightly - will try adding rozerem 8 mg nightly  Time  Spent Involved in Patient [name] on Day of Examination:  26 minutes  Follow up:   Patient Instructions  Rozerem 8 mg pill nightly. Okay to start using this with zolpidem.  Follow up in 4 months.  Medication List:   Allergies as of 05/19/2022   No Known Allergies      Medication List        Accurate as of May 19, 2022  2:25 PM. If you have any questions, ask your nurse or doctor.          amLODipine 5 MG tablet Commonly known as:  Norvasc Take 1 tablet (5 mg total) by mouth daily.   aspirin EC 81 MG tablet Take 81 mg by mouth daily.   atorvastatin 80 MG tablet Commonly known as: LIPITOR Take 1 tablet (80 mg total) by mouth daily.   diclofenac 75 MG EC tablet Commonly known as: VOLTAREN TAKE (1) TABLET BY MOUTH TWICE DAILY.   DULoxetine 30 MG capsule Commonly known as: Cymbalta Take 1 capsule (30 mg total) by mouth daily.   esomeprazole 20 MG capsule Commonly known as: NEXIUM Take 20 mg by mouth daily.   Fish Oil 1000 MG Cpdr Take 1,000 mg by mouth 2 (two) times daily.   hydrocortisone 2.5 % rectal cream Commonly known as: ANUSOL-HC Place 1 application. rectally 3 (three) times daily. Use three times a day for 10 days then as needed thereafter.   hydrocortisone 2.5 % rectal cream Commonly known as: ANUSOL-HC Place 1 Application rectally 2 (two) times daily.   ketoconazole 2 % cream Commonly known as: NIZORAL Apply 1 Application topically 2 (two) times daily. To rectum   metoprolol tartrate 50 MG tablet Commonly known as: LOPRESSOR Take 1 tablet (50 mg total) by mouth 2 (two) times daily.   OVER THE COUNTER MEDICATION Take 3 capsules by mouth daily. Balance of Nature (Fruits [name] Vegetables)   PRESERVISION AREDS 2 PO Take 1 tablet by mouth 2 (two) times daily.   ramelteon 8 MG tablet Commonly known as: Rozerem Take 1 tablet (8 mg total) by mouth at bedtime. Started by: Chesley Mires, MD   ramipril 10 MG capsule Commonly known as: ALTACE Take 10 mg by mouth daily.   Synthroid 112 MCG tablet Generic drug: levothyroxine Take 112 mcg by mouth daily before breakfast.   TYLENOL PM EXTRA STRENGTH PO Take by mouth. One qhs prn   Zinc Oxide 30 % Oint Apply 1 Application topically 4 (four) times daily. What changed: when to take this   zolpidem 10 MG tablet Commonly known as: AMBIEN Take 1 tablet (10 mg total) by mouth at bedtime.        Signature:  Chesley Mires, MD [place] Pager - [phone] 05/19/2022, 2:25 PM

## 2022-05-19 NOTE — Patient Instructions (Signed)
Rozerem 8 mg pill nightly. Okay to start using this with zolpidem.  Follow up in 4 months.

## 2022-05-22 ENCOUNTER — Other Ambulatory Visit (HOSPITAL_COMMUNITY): Payer: Self-pay

## 2022-05-22 ENCOUNTER — Telehealth: Payer: Self-pay | Admitting: Pulmonary Disease

## 2022-05-22 NOTE — Telephone Encounter (Signed)
Called and notified patient that we are waiting on a PA for his medication. He voiced understanding that we will call back once we find out if medication is approved or denied.

## 2022-05-22 NOTE — Telephone Encounter (Signed)
Rozerem 8 mg pill nightly  Needs a PA  Prior auth team please advise. Thanks!

## 2022-05-22 NOTE — Telephone Encounter (Signed)
Patient Advocate Encounter   Received notification that prior authorization for Ramelteon '8mg'$  is required/requested.   PA submitted on 10/09/023 to [facility] via CoverMyMeds Key: [id number]  Status is pending   Selinda Orion CPhT Rx Patient Advocate

## 2022-05-23 ENCOUNTER — Other Ambulatory Visit (HOSPITAL_COMMUNITY): Payer: Self-pay

## 2022-05-23 NOTE — Telephone Encounter (Signed)
ATC patient to notify. Left a detailed vm on cell phone (ok per dpr) letting patient know that medication was approved with a $30.85 copay. Advised patient to call back for questions or concerns. Nothing further needed at this time.

## 2022-05-23 NOTE — Telephone Encounter (Signed)
Received notification from [facility] regarding a prior authorization for RAMELTEON '8MG'$  TAB.   Authorization has been APPROVED from 05/23/2022 to 08/14/2023.   Per test claim, copay for 30 days supply is $30.85  Key: [id number] - PA Case ID: [id number]

## 2022-05-30 ENCOUNTER — Telehealth: Payer: Self-pay | Admitting: Gastroenterology

## 2022-05-30 NOTE — Telephone Encounter (Signed)
Pt came to the [facility] asking for his records be sent to [facility] and provided the fax number to forward them to. I had him sign a release that he was giving [place] permission to transfer his care/records to [facility] and he agreed. Records were faxed to [phone]

## 2022-05-30 NOTE — Telephone Encounter (Signed)
Thanks

## 2022-06-07 DIAGNOSIS — I10 Essential (primary) hypertension: Secondary | ICD-10-CM | POA: Diagnosis not present

## 2022-06-07 DIAGNOSIS — H6122 Impacted cerumen, left ear: Secondary | ICD-10-CM | POA: Diagnosis not present

## 2022-06-09 ENCOUNTER — Telehealth: Payer: Self-pay | Admitting: Gastroenterology

## 2022-06-09 NOTE — Telephone Encounter (Signed)
Good Afternoon Dr. Loletha Carrow,  Supervising MD 10/26 PM  We received records for this patient to be seen for hemorrhoids. He is a former patient at [facility]. He is requesting this transfer because he feels they have done all they could for him.  We have records for review. Please advise on scheduling. Thank you.

## 2022-06-14 NOTE — Telephone Encounter (Signed)
Spoke to patient and advised of [place]'s decision, patient hung up before I could finish

## 2022-06-14 NOTE — Telephone Encounter (Signed)
Thanks for the update

## 2022-06-14 NOTE — Telephone Encounter (Signed)
Lauren, Request received to transfer GI care from outside practice to [place].  I appreciate the interest in our practice, however due to high demand from patients without established GI providers, I cannot accommodate this transfer.   - H. Danis

## 2022-06-14 NOTE — Telephone Encounter (Signed)
Thanks for the update Dr. Loletha Carrow. Ann, please see the update below and update the patient. Thanks

## 2022-06-20 DIAGNOSIS — E782 Mixed hyperlipidemia: Secondary | ICD-10-CM | POA: Diagnosis not present

## 2022-06-20 DIAGNOSIS — E039 Hypothyroidism, unspecified: Secondary | ICD-10-CM | POA: Diagnosis not present

## 2022-06-20 DIAGNOSIS — E118 Type 2 diabetes mellitus with unspecified complications: Secondary | ICD-10-CM | POA: Diagnosis not present

## 2022-07-01 DIAGNOSIS — E782 Mixed hyperlipidemia: Secondary | ICD-10-CM | POA: Diagnosis not present

## 2022-07-01 DIAGNOSIS — I2510 Atherosclerotic heart disease of native coronary artery without angina pectoris: Secondary | ICD-10-CM | POA: Diagnosis not present

## 2022-07-01 DIAGNOSIS — G4700 Insomnia, unspecified: Secondary | ICD-10-CM | POA: Diagnosis not present

## 2022-07-01 DIAGNOSIS — N182 Chronic kidney disease, stage 2 (mild): Secondary | ICD-10-CM | POA: Diagnosis not present

## 2022-07-01 DIAGNOSIS — G4733 Obstructive sleep apnea (adult) (pediatric): Secondary | ICD-10-CM | POA: Diagnosis not present

## 2022-07-01 DIAGNOSIS — E118 Type 2 diabetes mellitus with unspecified complications: Secondary | ICD-10-CM | POA: Diagnosis not present

## 2022-07-01 DIAGNOSIS — Z23 Encounter for immunization: Secondary | ICD-10-CM | POA: Diagnosis not present

## 2022-07-01 DIAGNOSIS — E039 Hypothyroidism, unspecified: Secondary | ICD-10-CM | POA: Diagnosis not present

## 2022-07-01 DIAGNOSIS — I129 Hypertensive chronic kidney disease with stage 1 through stage 4 chronic kidney disease, or unspecified chronic kidney disease: Secondary | ICD-10-CM | POA: Diagnosis not present

## 2022-07-03 ENCOUNTER — Encounter: Payer: Self-pay | Admitting: Internal Medicine

## 2022-07-11 ENCOUNTER — Ambulatory Visit (INDEPENDENT_AMBULATORY_CARE_PROVIDER_SITE_OTHER): Payer: Medicare HMO | Admitting: Gastroenterology

## 2022-07-26 DIAGNOSIS — H6502 Acute serous otitis media, left ear: Secondary | ICD-10-CM | POA: Diagnosis not present

## 2022-09-07 ENCOUNTER — Ambulatory Visit: Payer: Medicare HMO | Attending: Cardiology | Admitting: Cardiology

## 2022-09-07 ENCOUNTER — Encounter: Payer: Self-pay | Admitting: Cardiology

## 2022-09-07 VITALS — BP 150/80 | HR 82 | Ht 73.0 in | Wt 215.0 lb

## 2022-09-07 DIAGNOSIS — I2510 Atherosclerotic heart disease of native coronary artery without angina pectoris: Secondary | ICD-10-CM | POA: Diagnosis not present

## 2022-09-07 DIAGNOSIS — I10 Essential (primary) hypertension: Secondary | ICD-10-CM

## 2022-09-07 DIAGNOSIS — I6523 Occlusion and stenosis of bilateral carotid arteries: Secondary | ICD-10-CM | POA: Diagnosis not present

## 2022-09-07 DIAGNOSIS — E782 Mixed hyperlipidemia: Secondary | ICD-10-CM

## 2022-09-07 MED ORDER — AMLODIPINE BESYLATE 10 MG PO TABS: 10.0000 mg | ORAL_TABLET | Freq: Every day | ORAL | 3 refills | Status: AC

## 2022-09-07 NOTE — Progress Notes (Signed)
Clinical Summary Mr. Kohlbeck is a 82 y.o.male seen today for follow up of the following medical problems.    1. CAD   - prior 3 vessel CABG July 2013, LVEF around that time by LV gram was 55%   - has had chronic MSK chest pain since his CABG that is unchanged       - no chest pains, no SOB/DOE - mixed compliance with meds   2. Carotid stenosis   - prior right CEA July 2013   - followed by vascular surgery    - no recent neuro symptoms.  - followed by vascular   3. HTN    - mixed compliance with meds       4. Hyperlipidemia   - compliant with statin, labs followed by pcp  03/2021 TC 143 TG 89 HDL 45 LDL 81 06/2022 TC 112 TG 104 HDL 39 LDL 54   5. OSA   -followed by Dr Halford Chessman   Wife recent hip fracture, completed surgery. Discharged to rehab, now home.    Past Medical History:  Diagnosis Date   Arthritis    CAD (coronary artery disease)    CAD (coronary artery disease) 12/04/2011   Left Main Disease with 3- vessel CAD    CAD (coronary artery disease)    seeing Dr Harl Bowie   CHF (congestive heart failure) ([place])    Deviated septum    Dyslipidemia    GERD (gastroesophageal reflux disease)    Heart attack ([place])    Hyperlipidemia    Hypertension    Hypothyroidism    MI (myocardial infarction) ([place]) 1997   Neuropathy    Obesity (BMI 30-39.9) 09/02/2018   PONV (postoperative nausea and vomiting)    has in past, no problems after most recent surgery   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   LIMA to LAD, SVG to OM2, SVG to PDA, EVH via right thigh   S/P carotid endarterectomy 02/29/2012   Right CEA for asymptomatic severe right ICA stenosis    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 90 tablet 2   aspirin EC 81 MG tablet Take 81 mg by mouth daily.     atorvastatin (LIPITOR) 80 MG tablet Take 1 tablet (80 mg total) by mouth daily. 90 tablet 3   diclofenac (VOLTAREN) 75 MG EC tablet TAKE (1) TABLET BY MOUTH TWICE DAILY. 60 tablet 0   diphenhydrAMINE-APAP, sleep, (TYLENOL PM EXTRA STRENGTH PO) Take by mouth. One qhs prn     DULoxetine (CYMBALTA) 30 MG capsule Take 1 capsule (30 mg total) by mouth daily. 30 capsule 3   esomeprazole (NEXIUM) 20 MG capsule Take 20 mg by mouth daily.      hydrocortisone (ANUSOL-HC) 2.5 % rectal cream Place 1 application. rectally 3 (three) times daily. Use three times a day for 10 days then as needed thereafter. 60 g 1   hydrocortisone (ANUSOL-HC) 2.5 % rectal cream Place 1 Application rectally 2 (two) times daily. [place]  g 1   ketoconazole (NIZORAL) 2 % cream Apply 1 Application topically 2 (two) times daily. To rectum 30 g 0   metoprolol (LOPRESSOR) 50 MG tablet Take 1 tablet (50 mg total) by mouth 2 (two) times daily. 180 tablet 1   Multiple Vitamins-Minerals (PRESERVISION AREDS 2 PO) Take 1 tablet by mouth 2 (two) times daily.      Omega-3 Fatty Acids (FISH OIL) 1000 MG CPDR Take 1,000 mg by mouth 2 (two) times daily.      OVER THE COUNTER MEDICATION Take 3 capsules by mouth daily. Balance of Nature (Fruits and Vegetables)     ramelteon (ROZEREM) 8 MG tablet Take 1 tablet (8 mg total) by mouth at bedtime. 30 tablet 5   ramipril (ALTACE) 10 MG capsule Take 10 mg by mouth daily.     SYNTHROID 112 MCG tablet Take 112 mcg by mouth daily before breakfast.      Zinc Oxide 30 % OINT Apply 1 Application topically 4 (four) times daily. (Patient taking differently: Apply 1 Application topically 3 (three) times daily.) 57 g 1   zolpidem (AMBIEN) 10 MG tablet Take 1 tablet (10 mg total) by mouth at bedtime. 30 tablet 5   No current  facility-administered medications for this visit.     Past Surgical History:  Procedure Laterality Date   ADENOIDECTOMY     CARDIAC CATHETERIZATION     02/22/12   CARDIOVASCULAR STRESS TEST  7/13   CAROTID ENDARTERECTOMY     COLONOSCOPY  15-20years and 12/04/11   Done at [place]  12/11/2011   Procedure: COLONOSCOPY;  Surgeon: Rogene Houston, MD;  [facility];  Service: Endoscopy;  Laterality: N/A;  830   COLONOSCOPY WITH PROPOFOL N/A 11/29/2021   Procedure: COLONOSCOPY WITH PROPOFOL;  Surgeon: Harvel Quale, MD;  [facility];  Service: Gastroenterology;  Laterality: N/A;  1105 ASA 2   CORONARY ARTERY BYPASS GRAFT  02/29/2012   Procedure: CORONARY ARTERY BYPASS GRAFTING (CABG);  Surgeon: Rexene Alberts, MD;  Location: [facility];  Service: Open Heart Surgery;  Laterality: N/A;  times three using Left Internal Mammary Artery and Right Greater Saphenous Vein Graft Harvested Endoscopically   CORONARY STENT PLACEMENT     ENDARTERECTOMY  02/29/2012   Procedure: ENDARTERECTOMY CAROTID;  Surgeon: Angelia Mould, MD;  Location: [place];  Service: Vascular;  Laterality: Right;   FOOT SURGERY  06/30/11   artificial joint in big toe right foot    KNEE ARTHROSCOPY WITH MEDIAL MENISECTOMY Left 07/15/2019   Procedure: KNEE ARTHROSCOPY WITH MEDIAL MENISCECTOMY AND LATERAL MENISCECTOMY;  Surgeon: Carole Civil, MD;  Location: [facility];  Service: Orthopedics;  Laterality: Left;   LEFT HEART CATHETERIZATION WITH CORONARY ANGIOGRAM N/A 02/20/2012   Procedure: LEFT HEART CATHETERIZATION WITH CORONARY ANGIOGRAM;  Surgeon: Troy Sine, MD;  Location: [facility] CATH LAB;  Service: Cardiovascular;  Laterality: N/A;   NASAL SEPTUM SURGERY     Totol thyroidectomy  02/06/2011   TRANSESOPHAGEAL ECHOCARDIOGRAM     UPPER GASTROINTESTINAL ENDOSCOPY  35-40 years ago   [facility]     No Known Allergies    Family History  Problem Relation Age of Onset    Hypertension Mother    Healthy Sister    Healthy Brother    Healthy Son    Heart attack Father    Heart disease Father        Before age 93   Hyperlipidemia Father  Hypertension Father    Prostate cancer Father    Colon cancer Neg Hx      Social History Mr. Seoane reports that he quit smoking about 21 years ago. His smoking use included cigarettes. He has a 7.50 pack-year smoking history. He has been exposed to tobacco smoke. He has never used smokeless tobacco. Mr. Beltre reports that he does not currently use alcohol after a past usage of about 3.0 - 4.0 standard drinks of alcohol per week.   Review of Systems CONSTITUTIONAL: No weight loss, fever, chills, weakness or fatigue.  HEENT: Eyes: No visual loss, blurred vision, double vision or yellow sclerae.No hearing loss, sneezing, congestion, runny nose or sore throat.  SKIN: No rash or itching.  CARDIOVASCULAR: per hpi RESPIRATORY: No shortness of breath, cough or sputum.  GASTROINTESTINAL: No anorexia, nausea, vomiting or diarrhea. No abdominal pain or blood.  GENITOURINARY: No burning on urination, no polyuria NEUROLOGICAL: No headache, dizziness, syncope, paralysis, ataxia, numbness or tingling in the extremities. No change in bowel or bladder control.  MUSCULOSKELETAL: No muscle, back pain, joint pain or stiffness.  LYMPHATICS: No enlarged nodes. No history of splenectomy.  PSYCHIATRIC: No history of depression or anxiety.  ENDOCRINOLOGIC: No reports of sweating, cold or heat intolerance. No polyuria or polydipsia.  Marland Kitchen   Physical Examination Today's Vitals   09/07/22 1021  BP: (!) 158/90  Pulse: 82  SpO2: 96%  Weight: 215 lb (97.5 kg)  Height: '6\' 1"'$  (1.854 m)   Body mass index is 28.37 kg/m.  Gen: resting comfortably, no acute distress HEENT: no scleral icterus, pupils equal round and reactive, no palptable cervical adenopathy,  CV: RRR, no m/r/g no jvd Resp: Clear to auscultation bilaterally GI: abdomen is  soft, non-tender, non-distended, normal bowel sounds, no hepatosplenomegaly MSK: extremities are warm, no edema.  Skin: warm, no rash Neuro:  no focal deficits Psych: appropriate affect   Diagnostic Studies 02/2012 Cath   1. Normal left ventricular function with an ejection fraction of at   least 55% with suggestion of minimal posterobasal inferior   hypocontractility.   2. Extensive coronary calcification involving the left main, left   anterior descending artery, left circumflex, and right coronary   arteries.   3. Probable ostial tapering of the left main to 60-70% with evidence   for AO pressure dampening by 10-15 mm concordant with ostial   stenosis.   4. 40% proximal circumflex with 70% circumflex marginal 1 stenosis.   5. Total occlusion of the right coronary artery at the ostium with   extensive collateralization to the posterior descending artery   vessel via the left coronary circulation.     03/2013 Echo   LVEF 55-60%, no WMAs, grade I diastolic dysfunction, mild MR     02/2013 Carotid US   Patent right CEA, <40% stenosis on left.     04/2015 Carotid US Patent RICA/CEA, LICA 94-85%    Assessment and Plan   1. CAD   - no recent symptoms, continue current meds   2. Carotid stenosis   -continue to follow with vascular - continue medical therapy     3. HTN   - he is above goal, increase norvasc to '10mg'$  daily.    4. Hyperlipidemia   - at goal continue atorvastatin       Arnoldo Lenis, M.D.

## 2022-09-07 NOTE — Patient Instructions (Signed)
Medication Instructions:  Your physician has recommended you make the following change in your medication:  -Increase Norvasc (Amlodipine) to 10 mg tablets daily.   Labwork: None  Testing/Procedures: None  Follow-Up: Follow up with Dr. Harl Bowie in 6 months.   Any Other Special Instructions Will Be Listed Below (If Applicable).     If you need a refill on your cardiac medications before your next appointment, please call your pharmacy.

## 2022-09-07 NOTE — Addendum Note (Signed)
Addended by: Levonne Hubert on: 09/07/2022 01:52 PM   Modules accepted: Orders

## 2022-09-26 DIAGNOSIS — H6692 Otitis media, unspecified, left ear: Secondary | ICD-10-CM | POA: Diagnosis not present

## 2022-09-26 DIAGNOSIS — H6502 Acute serous otitis media, left ear: Secondary | ICD-10-CM | POA: Diagnosis not present

## 2022-10-04 ENCOUNTER — Ambulatory Visit: Payer: Medicare HMO | Admitting: Cardiology

## 2022-10-05 ENCOUNTER — Ambulatory Visit: Payer: Medicare HMO | Admitting: Pulmonary Disease

## 2022-10-06 DIAGNOSIS — H40013 Open angle with borderline findings, low risk, bilateral: Secondary | ICD-10-CM | POA: Diagnosis not present

## 2022-10-06 DIAGNOSIS — H04123 Dry eye syndrome of bilateral lacrimal glands: Secondary | ICD-10-CM | POA: Diagnosis not present

## 2022-10-06 DIAGNOSIS — H35373 Puckering of macula, bilateral: Secondary | ICD-10-CM | POA: Diagnosis not present

## 2022-10-06 DIAGNOSIS — H33103 Unspecified retinoschisis, bilateral: Secondary | ICD-10-CM | POA: Diagnosis not present

## 2022-10-16 ENCOUNTER — Other Ambulatory Visit: Payer: Self-pay | Admitting: Cardiology

## 2022-10-24 DIAGNOSIS — L570 Actinic keratosis: Secondary | ICD-10-CM | POA: Diagnosis not present

## 2022-10-24 DIAGNOSIS — X32XXXD Exposure to sunlight, subsequent encounter: Secondary | ICD-10-CM | POA: Diagnosis not present

## 2022-10-31 DIAGNOSIS — H43813 Vitreous degeneration, bilateral: Secondary | ICD-10-CM | POA: Diagnosis not present

## 2022-10-31 DIAGNOSIS — H353221 Exudative age-related macular degeneration, left eye, with active choroidal neovascularization: Secondary | ICD-10-CM | POA: Diagnosis not present

## 2022-10-31 DIAGNOSIS — H35373 Puckering of macula, bilateral: Secondary | ICD-10-CM | POA: Diagnosis not present

## 2022-10-31 DIAGNOSIS — H353112 Nonexudative age-related macular degeneration, right eye, intermediate dry stage: Secondary | ICD-10-CM | POA: Diagnosis not present

## 2022-11-14 DIAGNOSIS — H353221 Exudative age-related macular degeneration, left eye, with active choroidal neovascularization: Secondary | ICD-10-CM | POA: Diagnosis not present

## 2022-11-15 ENCOUNTER — Other Ambulatory Visit: Payer: Self-pay | Admitting: *Deleted

## 2022-11-15 DIAGNOSIS — I6523 Occlusion and stenosis of bilateral carotid arteries: Secondary | ICD-10-CM

## 2022-11-22 ENCOUNTER — Telehealth: Payer: Self-pay | Admitting: *Deleted

## 2022-11-22 NOTE — Telephone Encounter (Signed)
Patient came by the desk and would like a refill of zolpidem tartrate (ambien 10mg  tablet) sent over [employer]. Patient would like a call back when order is in [phone]

## 2022-11-23 ENCOUNTER — Other Ambulatory Visit: Payer: Self-pay | Admitting: Pulmonary Disease

## 2022-11-23 ENCOUNTER — Ambulatory Visit (INDEPENDENT_AMBULATORY_CARE_PROVIDER_SITE_OTHER): Payer: Medicare HMO | Admitting: Physician Assistant

## 2022-11-23 ENCOUNTER — Encounter: Payer: Self-pay | Admitting: Physician Assistant

## 2022-11-23 ENCOUNTER — Ambulatory Visit (HOSPITAL_COMMUNITY)
Admission: RE | Admit: 2022-11-23 | Discharge: 2022-11-23 | Disposition: A | Discharge status: Home / Self Care | Payer: Medicare HMO | Source: Ambulatory Visit | Attending: Vascular Surgery | Admitting: Vascular Surgery

## 2022-11-23 DIAGNOSIS — I6523 Occlusion and stenosis of bilateral carotid arteries: Secondary | ICD-10-CM | POA: Insufficient documentation

## 2022-11-23 DIAGNOSIS — I6521 Occlusion and stenosis of right carotid artery: Secondary | ICD-10-CM

## 2022-11-23 NOTE — Telephone Encounter (Signed)
Please advise on refill request

## 2022-11-23 NOTE — Telephone Encounter (Signed)
Refill sent.

## 2022-11-23 NOTE — Progress Notes (Signed)
Office Note     CC:  follow up Requesting Provider:  Benita Stabile, MD  HPI: Jaquon Onate Travaglini is a 82 y.o. ([birth year]) male who presents for surveillance of carotid artery stenosis.  He underwent combined CABG and right carotid endarterectomy in 2013 with Dr. Edilia Bo.  CABG was performed by Dr. Cornelius Moras.  He denies any diagnosis of CVA or TIA since last office visit.  He continues to take his aspirin and statin daily.  He denies tobacco use however is a former smoker.  He tells me that his wife fell and broke her hip the day before Thanksgiving last year and continues to have limited mobility despite total hip replacement.   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   LIMA to LAD, SVG to OM2, SVG to PDA, EVH via right thigh   S/P carotid endarterectomy 02/29/2012   Right CEA for asymptomatic severe right ICA stenosis    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   Left Ear Rim (Cx3,5FU)   Shortness of breath    with exertion   Sleep apnea    CPAP, sleep study at [facility]   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   80-99% stenosis RICA, asymptomatic   Thyroid nodule    Wears glasses    or contacts    Past Surgical History:  Procedure Laterality Date   ADENOIDECTOMY     CARDIAC CATHETERIZATION     02/22/12   CARDIOVASCULAR STRESS TEST  7/13   CAROTID ENDARTERECTOMY     COLONOSCOPY  15-20years and 12/04/11   Done at  [facility]   COLONOSCOPY  12/11/2011   Procedure: COLONOSCOPY;  Surgeon: Malissa Hippo, MD;  [facility];  Service: Endoscopy;  Laterality: N/A;  830   COLONOSCOPY WITH PROPOFOL N/A 11/29/2021   Procedure: COLONOSCOPY WITH PROPOFOL;  Surgeon: Dolores Frame, MD;  [facility];  Service: Gastroenterology;  Laterality: N/A;  1105 ASA 2   CORONARY ARTERY BYPASS GRAFT  02/29/2012   Procedure: CORONARY ARTERY BYPASS GRAFTING (CABG);  Surgeon: Purcell Nails, MD;  Location: [facility] OR;  Service: Open Heart Surgery;  Laterality: N/A;  times three using Left Internal Mammary Artery and Right Greater Saphenous Vein Graft Harvested Endoscopically   CORONARY STENT PLACEMENT     ENDARTERECTOMY  02/29/2012   Procedure: ENDARTERECTOMY CAROTID;  Surgeon: Chuck Hint, MD;  Location: [facility] OR;  Service: Vascular;  Laterality: Right;   FOOT SURGERY  06/30/11   artificial joint in big toe right foot    KNEE ARTHROSCOPY WITH MEDIAL MENISECTOMY Left 07/15/2019   Procedure: KNEE ARTHROSCOPY WITH MEDIAL MENISCECTOMY AND LATERAL MENISCECTOMY;  Surgeon: Vickki Hearing, MD;  Location: [facility];  Service: Orthopedics;  Laterality: Left;   LEFT HEART CATHETERIZATION WITH CORONARY ANGIOGRAM N/A  02/20/2012   Procedure: LEFT HEART CATHETERIZATION WITH CORONARY ANGIOGRAM;  Surgeon: Lennette Bihari, MD;  Location: [facility] CATH LAB;  Service: Cardiovascular;  Laterality: N/A;   NASAL SEPTUM SURGERY     Totol thyroidectomy  02/06/2011   TRANSESOPHAGEAL ECHOCARDIOGRAM     UPPER GASTROINTESTINAL ENDOSCOPY  35-40 years ago   [facility]    Social History   Socioeconomic History   Marital status: Married    Spouse name: Woodruff Sink   Number of children: 1   Years of education: 12   Highest education level: Not on file  Occupational History    Comment: retired  Tobacco Use   Smoking status: Former    Packs/day: 0.50    Years: 15.00    Additional pack years: 0.00    Total pack years: 7.50     Types: Cigarettes    Quit date: 09/04/2001    Years since quitting: 21.2    Passive exposure: Current   Smokeless tobacco: Never   Tobacco comments:    smoked 26yrs, smoked 1/2 pack a day  Vaping Use   Vaping Use: Never used  Substance and Sexual Activity   Alcohol use: Not Currently    Alcohol/week: 3.0 - 4.0 standard drinks of alcohol    Types: 3 - 4 Cans of beer per week    Comment: social- beer , not on regular basis   Drug use: No   Sexual activity: Yes  Other Topics Concern   Not on file  Social History Narrative   Consumes 2-3 cups of caffeine daily   Right handed   Lives in a one story home with a basement    Social Determinants of Health   Financial Resource Strain: Not on file  Food Insecurity: Not on file  Transportation Needs: Not on file  Physical Activity: Not on file  Stress: Not on file  Social Connections: Not on file  Intimate Partner Violence: Not on file    Family History  Problem Relation Age of Onset   Hypertension Mother    Healthy Sister    Healthy Brother    Healthy Son    Heart attack Father    Heart disease Father        Before age 22   Hyperlipidemia Father    Hypertension Father    Prostate cancer Father    Colon cancer Neg Hx     Current Outpatient Medications  Medication Sig Dispense Refill   amitriptyline (ELAVIL) 25 MG tablet Take 25 mg by mouth at bedtime.     amLODipine (NORVASC) 10 MG tablet Take 1 tablet (10 mg total) by mouth daily. 90 tablet 3   aspirin EC 81 MG tablet Take 81 mg by mouth daily.     atorvastatin (LIPITOR) 80 MG tablet TAKE 1 TABLET EVERY DAY 90 tablet 0   diclofenac (VOLTAREN) 75 MG EC tablet TAKE (1) TABLET BY MOUTH TWICE DAILY. 60 tablet 0   diphenhydrAMINE-APAP, sleep, (TYLENOL PM EXTRA STRENGTH PO) Take by mouth. One qhs prn     DULoxetine (CYMBALTA) 30 MG capsule Take 1 capsule (30 mg total) by mouth daily. 30 capsule 3   esomeprazole (NEXIUM) 20 MG capsule Take 20 mg by mouth daily.       hydrocortisone (ANUSOL-HC) 2.5 % rectal cream Place 1 application. rectally 3 (three) times daily. Use three times a day for 10 days then as needed thereafter. 60 g 1   hydrocortisone (ANUSOL-HC) 2.5 % rectal cream Place 1 Application rectally 2 (  two) times daily. 30 g 1   ketoconazole (NIZORAL) 2 % cream Apply 1 Application topically 2 (two) times daily. To rectum 30 g 0   metoprolol (LOPRESSOR) 50 MG tablet Take 1 tablet (50 mg total) by mouth 2 (two) times daily. 180 tablet 1   Multiple Vitamins-Minerals (PRESERVISION AREDS 2 PO) Take 1 tablet by mouth 2 (two) times daily.      Omega-3 Fatty Acids (FISH OIL) 1000 MG CPDR Take 1,000 mg by mouth 2 (two) times daily.      OVER THE COUNTER MEDICATION Take 3 capsules by mouth daily. Balance of Nature (Fruits and Vegetables)     ramelteon (ROZEREM) 8 MG tablet Take 1 tablet (8 mg total) by mouth at bedtime. 30 tablet 5   ramipril (ALTACE) 10 MG capsule Take 10 mg by mouth daily.     SYNTHROID 112 MCG tablet Take 112 mcg by mouth daily before breakfast.      Zinc Oxide 30 % OINT Apply 1 Application topically 4 (four) times daily. (Patient not taking: Reported on 09/07/2022) 57 g 1   zolpidem (AMBIEN) 10 MG tablet TAKE 1 TABLET BY MOUTH ONCE AT BEDTIME. 30 tablet 5   No current facility-administered medications for this visit.    No Known Allergies   REVIEW OF SYSTEMS:   [X]  denotes positive finding, [ ]  denotes negative finding Cardiac  Comments:  Chest pain or chest pressure:    Shortness of breath upon exertion:    Short of breath when lying flat:    Irregular heart rhythm:        Vascular    Pain in calf, thigh, or hip brought on by ambulation:    Pain in feet at night that wakes you up from your sleep:     Blood clot in your veins:    Leg swelling:         Pulmonary    Oxygen at home:    Productive cough:     Wheezing:         Neurologic    Sudden weakness in arms or legs:     Sudden numbness in arms or legs:     Sudden onset  of difficulty speaking or slurred speech:    Temporary loss of vision in one eye:     Problems with dizziness:         Gastrointestinal    Blood in stool:     Vomited blood:         Genitourinary    Burning when urinating:     Blood in urine:        Psychiatric    Major depression:         Hematologic    Bleeding problems:    Problems with blood clotting too easily:        Skin    Rashes or ulcers:        Constitutional    Fever or chills:      PHYSICAL EXAMINATION:  There were no vitals filed for this visit.  General:  WDWN in NAD; vital signs documented above Gait: Not observed HENT: WNL, normocephalic Pulmonary: normal non-labored breathing , without Rales, rhonchi,  wheezing Cardiac: regular HR Abdomen: soft, NT, no masses Skin: without rashes Vascular Exam/Pulses: Symmetrical radial pulses Extremities: without ischemic changes, without Gangrene , without cellulitis; without open wounds;  Musculoskeletal: no muscle wasting or atrophy  Neurologic: A&O X 3; CN grossly intact Psychiatric:  The pt has Normal affect.  Non-Invasive Vascular Imaging:   B ICA 1-39% stenosis   ASSESSMENT/PLAN:: 82 y.o. male here for follow up for surveillance of carotid artery stenosis  -Subjectively has not had any neurological symptoms since last office visit.  Has not been diagnosed with TIA or CVA since last office visit.  Carotid duplex unchanged.  Bilateral internal carotid arteries with stenosis between 1 and 39%.  Continue aspirin and statin daily.  Repeat carotid duplex in 1 year per protocol.   Emilie Rutter, PA-C [facility] [phone]  Clinic MD:   Edilia Bo

## 2022-12-04 ENCOUNTER — Ambulatory Visit (INDEPENDENT_AMBULATORY_CARE_PROVIDER_SITE_OTHER): Payer: Medicare HMO | Admitting: Gastroenterology

## 2022-12-04 ENCOUNTER — Encounter (INDEPENDENT_AMBULATORY_CARE_PROVIDER_SITE_OTHER): Payer: Self-pay

## 2022-12-04 ENCOUNTER — Encounter (INDEPENDENT_AMBULATORY_CARE_PROVIDER_SITE_OTHER): Payer: Self-pay | Admitting: Gastroenterology

## 2022-12-04 VITALS — BP 138/83 | HR 70 | Temp 97.8°F | Ht 73.0 in | Wt 222.3 lb

## 2022-12-04 DIAGNOSIS — K6289 Other specified diseases of anus and rectum: Secondary | ICD-10-CM | POA: Diagnosis not present

## 2022-12-04 DIAGNOSIS — G8929 Other chronic pain: Secondary | ICD-10-CM | POA: Diagnosis not present

## 2022-12-04 MED ORDER — GABAPENTIN 300 MG PO CAPS: 300.0000 mg | ORAL_CAPSULE | Freq: Two times a day (BID) | ORAL | 1 refills | Status: DC

## 2022-12-04 NOTE — Patient Instructions (Addendum)
Schedule flexible sigmoidoscopy Start gabapentin 300 mg BID If persistent symptoms and normal flexible sigmoidoscopy, consider anorectal manometry. Patient should follow-up with spine surgeon regarding severe spinal foraminal narrowing

## 2022-12-04 NOTE — Progress Notes (Signed)
Katrinka Blazing, M.D. Gastroenterology & Hepatology [facility] [address] [place], [place] [address]  Primary Care Physician: Benita Stabile, MD [address] [place] [address]  I will communicate my assessment and recommendations to the referring MD via EMR.  Problems: Chronic rectal burning/pain.  History of Present Illness: [name] [name] is a 82 y.o. male with past medical history of RLS, thyroid disease, HLD, OSA, HTN, CKD, prediabetes,   who presents for follow up of rectal burning/pain and hemorrhoids.  The patient was last seen on 04/06/2022. At that time, the patient was advised to continue keeping area clean and dry, as well as to avoid straining.  Patient reports that he has been presenting constant and actually worsening pain in his rectum. Does not notice any difference when he defecates. Reports having these symptoms even before he had his colonoscopy. He reports Miralax every third day which keeps his stools soft. Has a BM every other day, but does not strain.   He reports having some streaks of stools in his underwear sometimes - this has been present intermittently for the last 3 months.  Notably, the patient has his last hemorrhoidal banding on 02/15/2022.  Patient reported he was supposed to have treatment of his spinal foraminal narrowing in the past but he had to postpone this due to his wife having medical issues.  Per most recent MRI from 2022 he had severe narrowing of L3-L4 and L4-L5, as well as L5-S1.  He reports that he tried taking Elavil 25 mg daily for few months but he did not have any improvement so he decided to stop this medication.  The patient denies having any nausea, vomiting, fever, chills, hematochezia, melena, hematemesis, abdominal distention, abdominal pain, diarrhea, jaundice, pruritus or weight loss.  Last Colonoscopy:11/29/21- Diverticulosis in the sigmoid colon. - Non-bleeding internal  hemorrhoids. - No specimens collected. Last Endoscopy:never  Past Medical History: 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   LIMA to LAD, SVG to OM2, SVG to PDA, EVH via right thigh   S/P carotid endarterectomy 02/29/2012   Right CEA for asymptomatic severe right ICA stenosis    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   Left Ear Rim (Cx3,5FU)   Shortness of breath    with exertion   Sleep apnea    CPAP, sleep study at [facility]   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   80-99% stenosis RICA, asymptomatic   Thyroid nodule    Wears glasses    or contacts    Past Surgical History: Past Surgical History:  Procedure Laterality Date   ADENOIDECTOMY     CARDIAC CATHETERIZATION     02/22/12   CARDIOVASCULAR STRESS TEST  7/13   CAROTID ENDARTERECTOMY     COLONOSCOPY  15-20years and 12/04/11   Done at [facility]   COLONOSCOPY  12/11/2011   Procedure: COLONOSCOPY;  Surgeon: Malissa Hippo, MD;  [facility];  Service: Endoscopy;  Laterality: N/A;  830   COLONOSCOPY WITH PROPOFOL N/A 11/29/2021   Procedure: COLONOSCOPY WITH PROPOFOL;  Surgeon: Dolores Frame, MD;  Location: [facility];  Service: Gastroenterology;  Laterality: N/A;  1105 ASA 2   CORONARY ARTERY BYPASS GRAFT  02/29/2012   Procedure: CORONARY ARTERY BYPASS GRAFTING (CABG);  Surgeon: Purcell Nails, MD;  Location: [facility] OR;   Service: Open Heart Surgery;  Laterality: N/A;  times three using Left Internal Mammary Artery and Right Greater Saphenous Vein Graft Harvested Endoscopically   CORONARY STENT PLACEMENT     ENDARTERECTOMY  02/29/2012   Procedure: ENDARTERECTOMY CAROTID;  Surgeon: Chuck Hint, MD;  Location: [facility] OR;  Service: Vascular;  Laterality: Right;   FOOT SURGERY  06/30/11   artificial joint in big toe right foot    KNEE ARTHROSCOPY WITH MEDIAL MENISECTOMY Left 07/15/2019   Procedure: KNEE ARTHROSCOPY WITH MEDIAL MENISCECTOMY AND LATERAL MENISCECTOMY;  Surgeon: Vickki Hearing, MD;  Location: [facility] ORS;  Service: Orthopedics;  Laterality: Left;   LEFT HEART CATHETERIZATION WITH CORONARY ANGIOGRAM N/A 02/20/2012   Procedure: LEFT HEART CATHETERIZATION WITH CORONARY ANGIOGRAM;  Surgeon: Lennette Bihari, MD;  Location: [facility] CATH LAB;  Service: Cardiovascular;  Laterality: N/A;   NASAL SEPTUM SURGERY     Totol thyroidectomy  02/06/2011   TRANSESOPHAGEAL ECHOCARDIOGRAM     UPPER GASTROINTESTINAL ENDOSCOPY  35-40 years ago   [facility]    Family History: Family History  Problem Relation Age of Onset   Hypertension Mother    Healthy Sister    Healthy Brother    Healthy Son    Heart attack Father    Heart disease Father        Before age 20   Hyperlipidemia Father    Hypertension Father    Prostate cancer Father    Colon cancer Neg Hx     Social History: Social History   Tobacco Use  Smoking Status Former   Packs/day: 0.50   Years: 15.00   Additional pack years: 0.00   Total pack years: 7.50   Types: Cigarettes   Quit date: 09/04/2001   Years since quitting: 21.2   Passive exposure: Current  Smokeless Tobacco Never  Tobacco Comments   smoked 83yrs, smoked 1/2 pack a day   Social History   Substance and Sexual Activity  Alcohol Use Not Currently   Alcohol/week: 3.0 - 4.0 standard drinks of alcohol   Types: 3 - 4 Cans of beer per week   Comment: social- beer , not on regular  basis   Social History   Substance and Sexual Activity  Drug Use No    Allergies: No Known Allergies  Medications: Current Outpatient Medications  Medication Sig Dispense Refill   amitriptyline (ELAVIL) 25 MG tablet Take 25 mg by mouth at bedtime.     amLODipine (NORVASC) 10 MG tablet Take 1 tablet (10 mg total) by mouth daily. 90 tablet 3   aspirin EC 81 MG tablet Take 81 mg by mouth daily.     atorvastatin (LIPITOR) 80 MG tablet TAKE 1 TABLET EVERY DAY 90 tablet 0   diclofenac (VOLTAREN) 75 MG EC tablet TAKE (1) TABLET BY MOUTH TWICE DAILY. 60 tablet 0   diphenhydrAMINE-APAP, sleep, (TYLENOL PM EXTRA STRENGTH PO) Take by mouth. One qhs prn  DULoxetine (CYMBALTA) 30 MG capsule Take 1 capsule (30 mg total) by mouth daily. 30 capsule 3   esomeprazole (NEXIUM) 20 MG capsule Take 20 mg by mouth daily.      metoprolol (LOPRESSOR) 50 MG tablet Take 1 tablet (50 mg total) by mouth 2 (two) times daily. 180 tablet 1   Multiple Vitamins-Minerals (PRESERVISION AREDS 2 PO) Take 1 tablet by mouth 2 (two) times daily.      Omega-3 Fatty Acids (FISH OIL) 1000 MG CPDR Take 1,000 mg by mouth 2 (two) times daily.      ramelteon (ROZEREM) 8 MG tablet Take 1 tablet (8 mg total) by mouth at bedtime. 30 tablet 5   ramipril (ALTACE) 10 MG capsule Take 10 mg by mouth daily.     SYNTHROID 112 MCG tablet Take 112 mcg by mouth daily before breakfast.      zolpidem (AMBIEN) 10 MG tablet Take 10 mg by mouth at bedtime as needed for sleep.     No current facility-administered medications for this visit.    Review of Systems: GENERAL: negative for malaise, night sweats HEENT: No changes in hearing or vision, no nose bleeds or other nasal problems. NECK: Negative for lumps, goiter, pain and significant neck swelling RESPIRATORY: Negative for cough, wheezing CARDIOVASCULAR: Negative for chest pain, leg swelling, palpitations, orthopnea GI: SEE HPI MUSCULOSKELETAL: Negative for joint pain or swelling, back  pain, and muscle pain. SKIN: Negative for lesions, rash PSYCH: Negative for sleep disturbance, mood disorder and recent psychosocial stressors. HEMATOLOGY Negative for prolonged bleeding, bruising easily, and swollen nodes. ENDOCRINE: Negative for cold or heat intolerance, polyuria, polydipsia and goiter. NEURO: negative for tremor, gait imbalance, syncope and seizures. The remainder of the review of systems is noncontributory.   Physical Exam: BP 138/83 (BP Location: Left Arm, Patient Position: Sitting, Cuff Size: Large)   Pulse 70   Temp 97.8 F (36.6 C) (Temporal)   Ht  (1.854 m)   Wt 222 lb 4.8 oz (100.8 kg)   BMI 29.33 kg/m  GENERAL: The patient is AO x3, in no acute distress. HEENT: Head is normocephalic and atraumatic. EOMI are intact. Mouth is well hydrated and without lesions. NECK: Supple. No masses LUNGS: Clear to auscultation. No presence of rhonchi/wheezing/rales. Adequate chest expansion HEART: RRR, normal s1 and s2. ABDOMEN: Soft, nontender, no guarding, no peritoneal signs, and nondistended. BS +. No masses. RECTAL EXAM: no external lesions but tneder in the L gluteal area without induration, normal tone, there is presence of tenderness with digital rectal exam in the inside of the rectum but no masses, brown stool without blood. Chaperone: Joanette Gula, CMA EXTREMITIES: Without any cyanosis, clubbing, rash, lesions or edema. NEUROLOGIC: AOx3, no focal motor deficit. SKIN: no jaundice, no rashes  Imaging/Labs: as above  I personally reviewed and interpreted the available labs, imaging and endoscopic files.  Impression and Plan: [name] [name] is a 82 y.o. male with past medical history of RLS, thyroid disease, HLD, OSA, HTN, CKD, prediabetes,   who presents for follow up of rectal burning/pain and hemorrhoids.  The patient has presented persistent pain in his rectum without improvement with use of TCAs or topical anal medication.  He did not have any  improvement with banding and actually his symptoms worsened.  He is very tender on rectal exam today.  Due to this we will proceed with a flexible sigmoidoscopy to evaluate his symptoms further, but I highly suspect his symptoms are related to possible nerve compression from foraminal  spinal narrowing.  Will start him on gabapentin for symptom relief as he did not have any improvement with the use of low-dose TCAs.  Due to this I advised the patient to follow-up with his spine surgeon as soon as possible.  If negative investigations and persistent symptoms, we will consider an anorectal manometry.  -Schedule flexible sigmoidoscopy Start gabapentin 300 mg BID -If persistent symptoms and normal flexible sigmoidoscopy, consider anorectal manometry. -Patient should follow-up with spine surgeon regarding severe spinal foraminal narrowing  All questions were answered.      Katrinka Blazing, MD Gastroenterology and Hepatology [facility]astroenterology

## 2022-12-04 NOTE — H&P (View-Only) (Signed)
[name] [name], M.D. Gastroenterology & Hepatology [facility] [address] [place], [place] [address]  Primary Care Physician: Hall, John Z, MD [address] [place]  [address]  I will communicate my assessment and recommendations to the referring MD via EMR.  Problems: Chronic rectal burning/pain.  History of Present Illness: [name] [name] is a 81 y.o. male with past medical history of RLS, thyroid disease, HLD, OSA, HTN, CKD, prediabetes,   who presents for follow up of rectal burning/pain and hemorrhoids.  The patient was last seen on 04/06/2022. At that time, the patient was advised to continue keeping area clean and dry, as well as to avoid straining.  Patient reports that he has been presenting constant and actually worsening pain in his rectum. Does not notice any difference when he defecates. Reports having these symptoms even before he had his colonoscopy. He reports Miralax every third day which keeps his stools soft. Has a BM every other day, but does not strain.   He reports having some streaks of stools in his underwear sometimes - this has been present intermittently for the last 3 months.  Notably, the patient has his last hemorrhoidal banding on 02/15/2022.  Patient reported he was supposed to have treatment of his spinal foraminal narrowing in the past but he had to postpone this due to his wife having medical issues.  Per most recent MRI from 2022 he had severe narrowing of L3-L4 and L4-L5, as well as L5-S1.  He reports that he tried taking Elavil 25 mg daily for few months but he did not have any improvement so he decided to stop this medication.  The patient denies having any nausea, vomiting, fever, chills, hematochezia, melena, hematemesis, abdominal distention, abdominal pain, diarrhea, jaundice, pruritus or weight loss.  Last Colonoscopy:11/29/21- Diverticulosis in the sigmoid colon. - Non-bleeding internal  hemorrhoids. - No specimens collected. Last Endoscopy:never  Past Medical History: Past Medical History:  Diagnosis Date   Arthritis    CAD (coronary artery disease)    CAD (coronary artery disease) 12/04/2011   Left Main Disease with 3- vessel CAD    CAD (coronary artery disease)    seeing Dr Branch   CHF (congestive heart failure)    Deviated septum    Dyslipidemia    GERD (gastroesophageal reflux disease)    Heart attack    Hyperlipidemia    Hypertension    Hypothyroidism    MI (myocardial infarction) 1997   Neuropathy    Obesity (BMI 30-39.9) 09/02/2018   PONV (postoperative nausea and vomiting)    has in past, no problems after most recent surgery   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   LIMA to LAD, SVG to OM2, SVG to PDA, EVH via right thigh   S/P carotid endarterectomy 02/29/2012   Right CEA for asymptomatic severe right ICA stenosis    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   Left Ear Rim (Cx3,5FU)   Shortness of breath    with exertion   Sleep apnea    CPAP, sleep study at [facility]   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   80-99% stenosis RICA, asymptomatic   Thyroid nodule    Wears glasses    or contacts    Past Surgical History: Past Surgical History:  Procedure Laterality Date   ADENOIDECTOMY     CARDIAC CATHETERIZATION     02/22/12   CARDIOVASCULAR STRESS TEST  7/13   CAROTID ENDARTERECTOMY     COLONOSCOPY    15-20years and 12/04/11   Done at [facility]   COLONOSCOPY  12/11/2011   Procedure: COLONOSCOPY;  Surgeon: Najeeb U Rehman, MD;  [facility];  Service: Endoscopy;  Laterality: N/A;  830   COLONOSCOPY WITH PROPOFOL N/A 11/29/2021   Procedure: COLONOSCOPY WITH PROPOFOL;  Surgeon: [name] Mayorga, Kailan Carmen, MD;  Location: [facility];  Service: Gastroenterology;  Laterality: N/A;  1105 ASA 2   CORONARY ARTERY BYPASS GRAFT  02/29/2012   Procedure: CORONARY ARTERY BYPASS GRAFTING (CABG);  Surgeon: Clarence H Owen, MD;  Location: [facility] OR;   Service: Open Heart Surgery;  Laterality: N/A;  times three using Left Internal Mammary Artery and Right Greater Saphenous Vein Graft Harvested Endoscopically   CORONARY STENT PLACEMENT     ENDARTERECTOMY  02/29/2012   Procedure: ENDARTERECTOMY CAROTID;  Surgeon: Christopher S Dickson, MD;  Location: [facility] OR;  Service: Vascular;  Laterality: Right;   FOOT SURGERY  06/30/11   artificial joint in big toe right foot    KNEE ARTHROSCOPY WITH MEDIAL MENISECTOMY Left 07/15/2019   Procedure: KNEE ARTHROSCOPY WITH MEDIAL MENISCECTOMY AND LATERAL MENISCECTOMY;  Surgeon: Harrison, Stanley E, MD;  Location: [facility] ORS;  Service: Orthopedics;  Laterality: Left;   LEFT HEART CATHETERIZATION WITH CORONARY ANGIOGRAM N/A 02/20/2012   Procedure: LEFT HEART CATHETERIZATION WITH CORONARY ANGIOGRAM;  Surgeon: Thomas A Kelly, MD;  Location: [facility] CATH LAB;  Service: Cardiovascular;  Laterality: N/A;   NASAL SEPTUM SURGERY     Totol thyroidectomy  02/06/2011   TRANSESOPHAGEAL ECHOCARDIOGRAM     UPPER GASTROINTESTINAL ENDOSCOPY  35-40 years ago   [place]    Family History: Family History  Problem Relation Age of Onset   Hypertension Mother    Healthy Sister    Healthy Brother    Healthy Son    Heart attack Father    Heart disease Father        Before age 60   Hyperlipidemia Father    Hypertension Father    Prostate cancer Father    Colon cancer Neg Hx     Social History: Social History   Tobacco Use  Smoking Status Former   Packs/day: 0.50   Years: 15.00   Additional pack years: 0.00   Total pack years: 7.50   Types: Cigarettes   Quit date: 09/04/2001   Years since quitting: 21.2   Passive exposure: Current  Smokeless Tobacco Never  Tobacco Comments   smoked 15yrs, smoked 1/2 pack a day   Social History   Substance and Sexual Activity  Alcohol Use Not Currently   Alcohol/week: 3.0 - 4.0 standard drinks of alcohol   Types: 3 - 4 Cans of beer per week   Comment: social- beer , not on regular  basis   Social History   Substance and Sexual Activity  Drug Use No    Allergies: No Known Allergies  Medications: Current Outpatient Medications  Medication Sig Dispense Refill   amitriptyline (ELAVIL) 25 MG tablet Take 25 mg by mouth at bedtime.     amLODipine (NORVASC) 10 MG tablet Take 1 tablet (10 mg total) by mouth daily. 90 tablet 3   aspirin EC 81 MG tablet Take 81 mg by mouth daily.     atorvastatin (LIPITOR) 80 MG tablet TAKE 1 TABLET EVERY DAY 90 tablet 0   diclofenac (VOLTAREN) 75 MG EC tablet TAKE (1) TABLET BY MOUTH TWICE DAILY. 60 tablet 0   diphenhydrAMINE-APAP, sleep, (TYLENOL PM EXTRA STRENGTH PO) Take by mouth. One qhs prn       DULoxetine (CYMBALTA) 30 MG capsule Take 1 capsule (30 mg total) by mouth daily. 30 capsule 3   esomeprazole (NEXIUM) 20 MG capsule Take 20 mg by mouth daily.      metoprolol (LOPRESSOR) 50 MG tablet Take 1 tablet (50 mg total) by mouth 2 (two) times daily. 180 tablet 1   Multiple Vitamins-Minerals (PRESERVISION AREDS 2 PO) Take 1 tablet by mouth 2 (two) times daily.      Omega-3 Fatty Acids (FISH OIL) 1000 MG CPDR Take 1,000 mg by mouth 2 (two) times daily.      ramelteon (ROZEREM) 8 MG tablet Take 1 tablet (8 mg total) by mouth at bedtime. 30 tablet 5   ramipril (ALTACE) 10 MG capsule Take 10 mg by mouth daily.     SYNTHROID 112 MCG tablet Take 112 mcg by mouth daily before breakfast.      zolpidem (AMBIEN) 10 MG tablet Take 10 mg by mouth at bedtime as needed for sleep.     No current facility-administered medications for this visit.    Review of Systems: GENERAL: negative for malaise, night sweats HEENT: No changes in hearing or vision, no nose bleeds or other nasal problems. NECK: Negative for lumps, goiter, pain and significant neck swelling RESPIRATORY: Negative for cough, wheezing CARDIOVASCULAR: Negative for chest pain, leg swelling, palpitations, orthopnea GI: SEE HPI MUSCULOSKELETAL: Negative for joint pain or swelling, back  pain, and muscle pain. SKIN: Negative for lesions, rash PSYCH: Negative for sleep disturbance, mood disorder and recent psychosocial stressors. HEMATOLOGY Negative for prolonged bleeding, bruising easily, and swollen nodes. ENDOCRINE: Negative for cold or heat intolerance, polyuria, polydipsia and goiter. NEURO: negative for tremor, gait imbalance, syncope and seizures. The remainder of the review of systems is noncontributory.   Physical Exam: BP 138/83 (BP Location: Left Arm, Patient Position: Sitting, Cuff Size: Large)   Pulse 70   Temp 97.8 F (36.6 C) (Temporal)   Ht 6' 1" (1.854 m)   Wt 222 lb 4.8 oz (100.8 kg)   BMI 29.33 kg/m  GENERAL: The patient is AO x3, in no acute distress. HEENT: Head is normocephalic and atraumatic. EOMI are intact. Mouth is well hydrated and without lesions. NECK: Supple. No masses LUNGS: Clear to auscultation. No presence of rhonchi/wheezing/rales. Adequate chest expansion HEART: RRR, normal s1 and s2. ABDOMEN: Soft, nontender, no guarding, no peritoneal signs, and nondistended. BS +. No masses. RECTAL EXAM: no external lesions but tneder in the L gluteal area without induration, normal tone, there is presence of tenderness with digital rectal exam in the inside of the rectum but no masses, brown stool without blood. Chaperone: Crystal Sutton, CMA EXTREMITIES: Without any cyanosis, clubbing, rash, lesions or edema. NEUROLOGIC: AOx3, no focal motor deficit. SKIN: no jaundice, no rashes  Imaging/Labs: as above  I personally reviewed and interpreted the available labs, imaging and endoscopic files.  Impression and Plan: [name] [name] is a 81 y.o. male with past medical history of RLS, thyroid disease, HLD, OSA, HTN, CKD, prediabetes,   who presents for follow up of rectal burning/pain and hemorrhoids.  The patient has presented persistent pain in his rectum without improvement with use of TCAs or topical anal medication.  He did not have any  improvement with banding and actually his symptoms worsened.  He is very tender on rectal exam today.  Due to this we will proceed with a flexible sigmoidoscopy to evaluate his symptoms further, but I highly suspect his symptoms are related to possible nerve compression from foraminal   spinal narrowing.  Will start him on gabapentin for symptom relief as he did not have any improvement with the use of low-dose TCAs.  Due to this I advised the patient to follow-up with his spine surgeon as soon as possible.  If negative investigations and persistent symptoms, we will consider an anorectal manometry.  -Schedule flexible sigmoidoscopy Start gabapentin 300 mg BID -If persistent symptoms and normal flexible sigmoidoscopy, consider anorectal manometry. -Patient should follow-up with spine surgeon regarding severe spinal foraminal narrowing  All questions were answered.      Darnise Montag Castaneda, MD Gastroenterology and Hepatology [facility]  

## 2022-12-06 ENCOUNTER — Telehealth (INDEPENDENT_AMBULATORY_CARE_PROVIDER_SITE_OTHER): Payer: Self-pay | Admitting: *Deleted

## 2022-12-06 NOTE — Telephone Encounter (Signed)
Fax from [place]. Gabapentin  was approved through 08/13/2020. Letter of approval faxed to [place] [facility].

## 2022-12-12 ENCOUNTER — Telehealth (INDEPENDENT_AMBULATORY_CARE_PROVIDER_SITE_OTHER): Payer: Self-pay | Admitting: Gastroenterology

## 2022-12-12 NOTE — Telephone Encounter (Signed)
Pt left voicemail stating that he lost his instructions in regard to flex sig scheduled for tomorrow. Pt states he knows he is suppose to give himself enemas but can not remember when. Contacted pt and went over instructions with him. Pt verbalized understanding.

## 2022-12-13 ENCOUNTER — Ambulatory Visit (HOSPITAL_COMMUNITY): Payer: Medicare HMO | Admitting: Anesthesiology

## 2022-12-13 ENCOUNTER — Encounter (HOSPITAL_COMMUNITY)
Admission: RE | Disposition: A | Discharge status: Home / Self Care | Payer: Self-pay | Source: Home / Self Care | Attending: Gastroenterology

## 2022-12-13 ENCOUNTER — Other Ambulatory Visit: Payer: Self-pay

## 2022-12-13 ENCOUNTER — Encounter (HOSPITAL_COMMUNITY): Payer: Self-pay | Admitting: Gastroenterology

## 2022-12-13 ENCOUNTER — Ambulatory Visit (HOSPITAL_COMMUNITY)
Admission: RE | Admit: 2022-12-13 | Discharge: 2022-12-13 | Disposition: A | Discharge status: Home / Self Care | Payer: Medicare HMO | Attending: Gastroenterology | Admitting: Gastroenterology

## 2022-12-13 DIAGNOSIS — I130 Hypertensive heart and chronic kidney disease with heart failure and stage 1 through stage 4 chronic kidney disease, or unspecified chronic kidney disease: Secondary | ICD-10-CM | POA: Diagnosis not present

## 2022-12-13 DIAGNOSIS — E785 Hyperlipidemia, unspecified: Secondary | ICD-10-CM | POA: Insufficient documentation

## 2022-12-13 DIAGNOSIS — K6289 Other specified diseases of anus and rectum: Secondary | ICD-10-CM | POA: Diagnosis not present

## 2022-12-13 DIAGNOSIS — I2510 Atherosclerotic heart disease of native coronary artery without angina pectoris: Secondary | ICD-10-CM | POA: Insufficient documentation

## 2022-12-13 DIAGNOSIS — K648 Other hemorrhoids: Secondary | ICD-10-CM | POA: Diagnosis not present

## 2022-12-13 DIAGNOSIS — Z87891 Personal history of nicotine dependence: Secondary | ICD-10-CM | POA: Diagnosis not present

## 2022-12-13 DIAGNOSIS — I252 Old myocardial infarction: Secondary | ICD-10-CM | POA: Diagnosis not present

## 2022-12-13 DIAGNOSIS — G2581 Restless legs syndrome: Secondary | ICD-10-CM | POA: Insufficient documentation

## 2022-12-13 DIAGNOSIS — N189 Chronic kidney disease, unspecified: Secondary | ICD-10-CM | POA: Insufficient documentation

## 2022-12-13 DIAGNOSIS — R7303 Prediabetes: Secondary | ICD-10-CM | POA: Insufficient documentation

## 2022-12-13 DIAGNOSIS — E039 Hypothyroidism, unspecified: Secondary | ICD-10-CM | POA: Diagnosis not present

## 2022-12-13 DIAGNOSIS — R0602 Shortness of breath: Secondary | ICD-10-CM | POA: Insufficient documentation

## 2022-12-13 DIAGNOSIS — K219 Gastro-esophageal reflux disease without esophagitis: Secondary | ICD-10-CM | POA: Insufficient documentation

## 2022-12-13 DIAGNOSIS — I509 Heart failure, unspecified: Secondary | ICD-10-CM | POA: Diagnosis not present

## 2022-12-13 DIAGNOSIS — Z5309 Procedure and treatment not carried out because of other contraindication: Secondary | ICD-10-CM | POA: Diagnosis not present

## 2022-12-13 DIAGNOSIS — G4730 Sleep apnea, unspecified: Secondary | ICD-10-CM | POA: Diagnosis not present

## 2022-12-13 SURGERY — SIGMOIDOSCOPY, FLEXIBLE
Anesthesia: General

## 2022-12-13 MED ORDER — LACTATED RINGERS IV SOLN: INTRAVENOUS | Status: AC | PRN

## 2022-12-13 NOTE — Progress Notes (Addendum)
Procedure was canceled as the patient did not have a ride back to his home.  He will give [place] a call back to reschedule his flexible sigmoidoscopy once he has a driver. I offered an unsedated flexible sigmoidoscopy but he declined.

## 2022-12-13 NOTE — Anesthesia Preprocedure Evaluation (Signed)
Anesthesia Evaluation  Patient identified by MRN, date of birth, ID band Patient awake    Reviewed: Allergy & Precautions, H&P , NPO status , Patient's Chart, lab work & pertinent test results, reviewed documented beta blocker date and time   History of Anesthesia Complications (+) PONV and history of anesthetic complications  Airway Mallampati: II  TM Distance: >3 FB Neck ROM: full    Dental no notable dental hx.    Pulmonary neg pulmonary ROS, shortness of breath, sleep apnea , former smoker   Pulmonary exam normal breath sounds clear to auscultation       Cardiovascular Exercise Tolerance: Good hypertension, + CAD, + Past MI and +CHF  negative cardio ROS  Rhythm:regular Rate:Normal     Neuro/Psych negative neurological ROS  negative psych ROS   GI/Hepatic negative GI ROS, Neg liver ROS,GERD  ,,  Endo/Other  negative endocrine ROSHypothyroidism    Renal/GU negative Renal ROS  negative genitourinary   Musculoskeletal   Abdominal   Peds  Hematology negative hematology ROS (+)   Anesthesia Other Findings   Reproductive/Obstetrics negative OB ROS                             Anesthesia Physical Anesthesia Plan  ASA: 3  Anesthesia Plan: General   Post-op Pain Management:    Induction:   PONV Risk Score and Plan: Propofol infusion  Airway Management Planned:   Additional Equipment:   Intra-op Plan:   Post-operative Plan:   Informed Consent: I have reviewed the patients History and Physical, chart, labs and discussed the procedure including the risks, benefits and alternatives for the proposed anesthesia with the patient or authorized representative who has indicated his/her understanding and acceptance.     Dental Advisory Given  Plan Discussed with: CRNA  Anesthesia Plan Comments:        Anesthesia Quick Evaluation

## 2022-12-13 NOTE — Interval H&P Note (Signed)
History and Physical Interval Note:  12/13/2022 11:25 AM  [name] [name]  has presented today for surgery, with the diagnosis of rectal pain.  The various methods of treatment have been discussed with the patient and family. After consideration of risks, benefits and other options for treatment, the patient has consented to  Procedure(s) with comments: FLEXIBLE SIGMOIDOSCOPY (N/A) - 1:00pm;asa 1-2 as a surgical intervention.  The patient's history has been reviewed, patient examined, no change in status, stable for surgery.  I have reviewed the patient's chart and labs.  Questions were answered to the patient's satisfaction.     [name]

## 2022-12-23 ENCOUNTER — Other Ambulatory Visit: Payer: Self-pay | Admitting: Cardiology

## 2022-12-29 DIAGNOSIS — H353112 Nonexudative age-related macular degeneration, right eye, intermediate dry stage: Secondary | ICD-10-CM | POA: Diagnosis not present

## 2022-12-29 DIAGNOSIS — H353221 Exudative age-related macular degeneration, left eye, with active choroidal neovascularization: Secondary | ICD-10-CM | POA: Diagnosis not present

## 2022-12-29 DIAGNOSIS — H35373 Puckering of macula, bilateral: Secondary | ICD-10-CM | POA: Diagnosis not present

## 2022-12-29 DIAGNOSIS — H43813 Vitreous degeneration, bilateral: Secondary | ICD-10-CM | POA: Diagnosis not present

## 2023-02-05 ENCOUNTER — Ambulatory Visit (INDEPENDENT_AMBULATORY_CARE_PROVIDER_SITE_OTHER): Payer: Medicare HMO | Admitting: Gastroenterology

## 2023-02-07 DIAGNOSIS — H353112 Nonexudative age-related macular degeneration, right eye, intermediate dry stage: Secondary | ICD-10-CM | POA: Diagnosis not present

## 2023-02-07 DIAGNOSIS — H43813 Vitreous degeneration, bilateral: Secondary | ICD-10-CM | POA: Diagnosis not present

## 2023-02-07 DIAGNOSIS — H353221 Exudative age-related macular degeneration, left eye, with active choroidal neovascularization: Secondary | ICD-10-CM | POA: Diagnosis not present

## 2023-02-07 DIAGNOSIS — H35373 Puckering of macula, bilateral: Secondary | ICD-10-CM | POA: Diagnosis not present

## 2023-03-07 DIAGNOSIS — E782 Mixed hyperlipidemia: Secondary | ICD-10-CM | POA: Diagnosis not present

## 2023-03-07 DIAGNOSIS — I2510 Atherosclerotic heart disease of native coronary artery without angina pectoris: Secondary | ICD-10-CM | POA: Diagnosis not present

## 2023-03-07 DIAGNOSIS — E118 Type 2 diabetes mellitus with unspecified complications: Secondary | ICD-10-CM | POA: Diagnosis not present

## 2023-03-12 DIAGNOSIS — N182 Chronic kidney disease, stage 2 (mild): Secondary | ICD-10-CM | POA: Diagnosis not present

## 2023-03-12 DIAGNOSIS — E782 Mixed hyperlipidemia: Secondary | ICD-10-CM | POA: Diagnosis not present

## 2023-03-12 DIAGNOSIS — H9202 Otalgia, left ear: Secondary | ICD-10-CM | POA: Diagnosis not present

## 2023-03-12 DIAGNOSIS — Z0001 Encounter for general adult medical examination with abnormal findings: Secondary | ICD-10-CM | POA: Diagnosis not present

## 2023-03-12 DIAGNOSIS — I2510 Atherosclerotic heart disease of native coronary artery without angina pectoris: Secondary | ICD-10-CM | POA: Diagnosis not present

## 2023-03-12 DIAGNOSIS — I129 Hypertensive chronic kidney disease with stage 1 through stage 4 chronic kidney disease, or unspecified chronic kidney disease: Secondary | ICD-10-CM | POA: Diagnosis not present

## 2023-03-12 DIAGNOSIS — E039 Hypothyroidism, unspecified: Secondary | ICD-10-CM | POA: Diagnosis not present

## 2023-03-12 DIAGNOSIS — E1122 Type 2 diabetes mellitus with diabetic chronic kidney disease: Secondary | ICD-10-CM | POA: Diagnosis not present

## 2023-03-12 DIAGNOSIS — G4700 Insomnia, unspecified: Secondary | ICD-10-CM | POA: Diagnosis not present

## 2023-03-21 DIAGNOSIS — H35373 Puckering of macula, bilateral: Secondary | ICD-10-CM | POA: Diagnosis not present

## 2023-03-21 DIAGNOSIS — H43813 Vitreous degeneration, bilateral: Secondary | ICD-10-CM | POA: Diagnosis not present

## 2023-03-21 DIAGNOSIS — H353112 Nonexudative age-related macular degeneration, right eye, intermediate dry stage: Secondary | ICD-10-CM | POA: Diagnosis not present

## 2023-03-21 DIAGNOSIS — H353221 Exudative age-related macular degeneration, left eye, with active choroidal neovascularization: Secondary | ICD-10-CM | POA: Diagnosis not present

## 2023-04-04 DIAGNOSIS — R42 Dizziness and giddiness: Secondary | ICD-10-CM | POA: Diagnosis not present

## 2023-04-04 DIAGNOSIS — R55 Syncope and collapse: Secondary | ICD-10-CM | POA: Diagnosis not present

## 2023-04-04 DIAGNOSIS — R918 Other nonspecific abnormal finding of lung field: Secondary | ICD-10-CM | POA: Diagnosis not present

## 2023-04-04 DIAGNOSIS — I510 Cardiac septal defect, acquired: Secondary | ICD-10-CM | POA: Diagnosis not present

## 2023-04-04 DIAGNOSIS — R9431 Abnormal electrocardiogram [ECG] [EKG]: Secondary | ICD-10-CM | POA: Diagnosis not present

## 2023-04-04 DIAGNOSIS — R911 Solitary pulmonary nodule: Secondary | ICD-10-CM | POA: Diagnosis not present

## 2023-04-04 DIAGNOSIS — I951 Orthostatic hypotension: Secondary | ICD-10-CM | POA: Diagnosis not present

## 2023-04-04 DIAGNOSIS — R001 Bradycardia, unspecified: Secondary | ICD-10-CM | POA: Diagnosis not present

## 2023-04-04 LAB — LAB REPORT - SCANNED: EGFR: 58

## 2023-04-05 NOTE — Progress Notes (Signed)
Clinical Summary [name] [name] is a 82 y.o.male seen today for follow up of the following medical problems.    1. CAD   - prior 3 vessel CABG July 2013, LVEF around that time by LV gram was 55%   - has had chronic MSK chest pain since his CABG that is unchanged   -no chest pains, no SOB/DOE - compliant with meds   2. Carotid stenosis   - prior right CEA July 2013   - followed by vascular surgery, last visit 11/2022    3. HTN    - he is compliant with meds       4. Hyperlipidemia   - compliant with statin, labs followed by pcp  - up 20 lbs since Jan 2024 - 02/2023 TC 136 TG 137 HDL 35 LDL 77   5. OSA   -followed by Dr Craige Cotta  6. Syncope - episode while waiting on a trump rally - limited oral intake that tday, warm humid day. Waiting about 1 hour outside to get in. - felt lightheaded all of sudden, no other symptoms - reports when EMS evlauated him was told his bp was low - seen at [facility] ER, we are requesting records. He reports extensive testing, told he was dehydrated.      Wife recent hip fracture, completed surgery. Discharged to rehab, now home. Wife dealing with some "fluid on the brain", extended stay at [facility]  Past Medical History:  Diagnosis Date   Arthritis    CAD (coronary artery disease)    CAD (coronary artery disease) 12/04/2011   Left Main Disease with 3- vessel CAD    CAD (coronary artery disease)    seeing Dr Wyline Mood   CHF (congestive heart failure) (HCC)    Deviated septum    Dyslipidemia    GERD (gastroesophageal reflux disease)    Heart attack (HCC)    Hyperlipidemia    Hypertension    Hypothyroidism    MI (myocardial infarction) (HCC) 1997   Neuropathy    Obesity (BMI 30-39.9) 09/02/2018   PONV (postoperative nausea and vomiting)    has in past, no problems after most recent surgery   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   LIMA to LAD, SVG to OM2, SVG to PDA, EVH via right thigh   S/P carotid endarterectomy 02/29/2012   Right CEA for  asymptomatic severe right ICA stenosis    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 90 tablet 3   aspirin EC 81 MG tablet Take 81 mg by mouth in the morning.     atorvastatin (LIPITOR) 80 MG tablet TAKE 1 TABLET EVERY DAY 90 tablet 3   DULoxetine (CYMBALTA) 30 MG capsule Take 1 capsule (30 mg total) by mouth daily. 30 capsule 3   esomeprazole (NEXIUM) 20 MG capsule Take 20 mg by mouth daily before breakfast.     gabapentin (NEURONTIN) 300 MG capsule Take 1 capsule (300 mg total) by mouth 2 (two) times daily.  180 capsule 1   metoprolol (LOPRESSOR) 50 MG tablet Take 1 tablet (50 mg total) by mouth 2 (two) times daily. 180 tablet 1   Multiple Vitamins-Minerals (PRESERVISION AREDS 2 PO) Take 1 tablet by mouth 2 (two) times daily.      Omega-3 Fatty Acids (FISH OIL) 1000 MG CPDR Take 1,000-2,000 mg by mouth See admin instructions. Take 2 capsules (2000 mg) by mouth once daily in the morning & take 1 capsule (1000 mg) by mouth in the evening.     ramelteon (ROZEREM) 8 MG tablet Take 1 tablet (8 mg total) by mouth at bedtime. (Patient not taking: Reported on 12/08/2022) 30 tablet 5   ramipril (ALTACE) 10 MG capsule Take 10 mg by mouth in the morning.     SYNTHROID 112 MCG tablet Take 112 mcg by mouth daily before breakfast.      zolpidem (AMBIEN) 10 MG tablet Take 10 mg by mouth at bedtime.     No current facility-administered medications for this visit.   Facility-Administered Medications Ordered in Other Visits  Medication Dose Route Frequency Provider Last Rate Last Admin   lactated  ringers infusion   Intravenous Continuous PRN Julian Reil, CRNA   New Bag at 12/13/22 1142     Past Surgical History:  Procedure Laterality Date   ADENOIDECTOMY     CARDIAC CATHETERIZATION     02/22/12   CARDIOVASCULAR STRESS TEST  7/13   CAROTID ENDARTERECTOMY     COLONOSCOPY  15-20years and 12/04/11   Done at [facility]   COLONOSCOPY  12/11/2011   Procedure: COLONOSCOPY;  Surgeon: Malissa Hippo, MD;  Location: [facility];  Service: Endoscopy;  Laterality: N/A;  830   COLONOSCOPY WITH PROPOFOL N/A 11/29/2021   Procedure: COLONOSCOPY WITH PROPOFOL;  Surgeon: Dolores Frame, MD;  Location: [facility];  Service: Gastroenterology;  Laterality: N/A;  1105 ASA 2   CORONARY ARTERY BYPASS GRAFT  02/29/2012   Procedure: CORONARY ARTERY BYPASS GRAFTING (CABG);  Surgeon: Purcell Nails, MD;  Location[facility] OR;  Service: Open Heart Surgery;  Laterality: N/A;  times three using Left Internal Mammary Artery and Right Greater Saphenous Vein Graft Harvested Endoscopically   CORONARY STENT PLACEMENT     ENDARTERECTOMY  02/29/2012   Procedure: ENDARTERECTOMY CAROTID;  Surgeon: Chuck Hint, MD;  Location: [facility] OR;  Service: Vascular;  Laterality: Right;   FOOT SURGERY  06/30/11   artificial joint in big toe right foot    KNEE ARTHROSCOPY WITH MEDIAL MENISECTOMY Left 07/15/2019   Procedure: KNEE ARTHROSCOPY WITH MEDIAL MENISCECTOMY AND LATERAL MENISCECTOMY;  Surgeon: Vickki Hearing, MD;  Location: [facility] ORS;  Service: Orthopedics;  Laterality: Left;   LEFT HEART CATHETERIZATION WITH CORONARY ANGIOGRAM N/A 02/20/2012   Procedure: LEFT HEART CATHETERIZATION WITH CORONARY ANGIOGRAM;  Surgeon: Lennette Bihari, MD;  Location: [facility] CATH LAB;  Service: Cardiovascular;  Laterality: N/A;   NASAL SEPTUM SURGERY     Totol thyroidectomy  02/06/2011   TRANSESOPHAGEAL ECHOCARDIOGRAM     UPPER GASTROINTESTINAL ENDOSCOPY  35-40 years ago   [facility]     No Known Allergies    Family  History  Problem Relation Age of Onset   Hypertension Mother    Healthy Sister    Healthy Brother    Healthy Son    Heart attack Father    Heart disease Father        Before age 58   Hyperlipidemia Father    Hypertension Father  Prostate cancer Father    Colon cancer Neg Hx      Social History [name] [name] reports that he quit smoking about 21 years ago. His smoking use included cigarettes. He started smoking about 36 years ago. He has a 7.5 pack-year smoking history. He has been exposed to tobacco smoke. He has never used smokeless tobacco. [name] [name] reports that he does not currently use alcohol after a past usage of about 3.0 - 4.0 standard drinks of alcohol per week.   Review of Systems CONSTITUTIONAL: No weight loss, fever, chills, weakness or fatigue.  HEENT: Eyes: No visual loss, blurred vision, double vision or yellow sclerae.No hearing loss, sneezing, congestion, runny nose or sore throat.  SKIN: No rash or itching.  CARDIOVASCULAR: per hpi RESPIRATORY: No shortness of breath, cough or sputum.  GASTROINTESTINAL: No anorexia, nausea, vomiting or diarrhea. No abdominal pain or blood.  GENITOURINARY: No burning on urination, no polyuria NEUROLOGICAL: per hpi MUSCULOSKELETAL: No muscle, back pain, joint pain or stiffness.  LYMPHATICS: No enlarged nodes. No history of splenectomy.  PSYCHIATRIC: No history of depression or anxiety.  ENDOCRINOLOGIC: No reports of sweating, cold or heat intolerance. No polyuria or polydipsia.  Marland Kitchen   Physical Examination Today's Vitals   04/06/23 1026  BP: 126/82  Pulse: 80  SpO2: 95%  Weight: 234 lb (106.1 kg)  Height: 6\' 1"  (1.854 m)   Body mass index is 30.87 kg/m.  Gen: resting comfortably, no acute distress HEENT: no scleral icterus, pupils equal round and reactive, no palptable cervical adenopathy,  CV: RRR, no m/rg, no jvd Resp: Clear to auscultation bilaterally GI: abdomen is soft, non-tender, non-distended, normal bowel  sounds, no hepatosplenomegaly MSK: extremities are warm, no edema.  Skin: warm, no rash Neuro:  no focal deficits Psych: appropriate affect   Diagnostic Studies  02/2012 Cath   1. Normal left ventricular function with an ejection fraction of at   least 55% with suggestion of minimal posterobasal inferior   hypocontractility.   2. Extensive coronary calcification involving the left main, left   anterior descending artery, left circumflex, and right coronary   arteries.   3. Probable ostial tapering of the left main to 60-70% with evidence   for AO pressure dampening by 10-15 mm concordant with ostial   stenosis.   4. 40% proximal circumflex with 70% circumflex marginal 1 stenosis.   5. Total occlusion of the right coronary artery at the ostium with   extensive collateralization to the posterior descending artery   vessel via the left coronary circulation.     03/2013 Echo   LVEF 55-60%, no WMAs, grade I diastolic dysfunction, mild MR     02/2013 Carotid US   Patent right CEA, <40% stenosis on left.     04/2015 Carotid US Patent RICA/CEA, LICA 40-59%   Assessment and Plan  1. CAD   -denies symptoms, continue current meds       2. HTN   - at goal, continue current meds   3. Hyperlipidemia   - LDL above goal. With prior CABG, CEA, age, would aim for LDL <55 for high risk patient.  - d/c atorvastatin, start crestor 40mg  daily      Antoine Poche, M.D

## 2023-04-06 ENCOUNTER — Ambulatory Visit: Payer: Medicare HMO | Attending: Cardiology | Admitting: Cardiology

## 2023-04-06 ENCOUNTER — Encounter: Payer: Self-pay | Admitting: Cardiology

## 2023-04-06 VITALS — BP 126/82 | HR 80 | Ht 73.0 in | Wt 234.0 lb

## 2023-04-06 DIAGNOSIS — I2510 Atherosclerotic heart disease of native coronary artery without angina pectoris: Secondary | ICD-10-CM | POA: Diagnosis not present

## 2023-04-06 DIAGNOSIS — E782 Mixed hyperlipidemia: Secondary | ICD-10-CM | POA: Diagnosis not present

## 2023-04-06 DIAGNOSIS — I10 Essential (primary) hypertension: Secondary | ICD-10-CM

## 2023-04-06 MED ORDER — ROSUVASTATIN CALCIUM 40 MG PO TABS
40.0000 mg | ORAL_TABLET | Freq: Every day | ORAL | 3 refills | Status: DC
Start: 2023-04-06 — End: 2024-03-19

## 2023-04-06 NOTE — Patient Instructions (Signed)
Medication Instructions:  Your physician has recommended you make the following change in your medication:   -Stop Atorvastatin  -Start Rosuvastatin 40 mg tablet once daily.   *If you need a refill on your cardiac medications before your next appointment, please call your pharmacy*   Lab Work: None If you have labs (blood work) drawn today and your tests are completely normal, you will receive your results only by: MyChart Message (if you have MyChart) OR A paper copy in the mail If you have any lab test that is abnormal or we need to change your treatment, we will call you to review the results.   Testing/Procedures: None   Follow-Up: At [facility], you and your health needs are our priority.  As part of our continuing mission to provide you with exceptional heart care, we have created designated Provider Care Teams.  These Care Teams include your primary Cardiologist (physician) and Advanced Practice Providers (APPs -  Physician Assistants and Nurse Practitioners) who all work together to provide you with the care you need, when you need it.  We recommend signing up for the patient portal called "MyChart".  Sign up information is provided on this After Visit Summary.  MyChart is used to connect with patients for Virtual Visits (Telemedicine).  Patients are able to view lab/test results, encounter notes, upcoming appointments, etc.  Non-urgent messages can be sent to your provider as well.   To learn more about what you can do with MyChart, go to [email or web address].    Your next appointment:   6 month(s)  Provider:   Dina Rich, MD    Other Instructions

## 2023-04-10 DIAGNOSIS — H6123 Impacted cerumen, bilateral: Secondary | ICD-10-CM | POA: Diagnosis not present

## 2023-04-10 DIAGNOSIS — H9212 Otorrhea, left ear: Secondary | ICD-10-CM | POA: Diagnosis not present

## 2023-04-10 DIAGNOSIS — H7292 Unspecified perforation of tympanic membrane, left ear: Secondary | ICD-10-CM | POA: Diagnosis not present

## 2023-04-24 DIAGNOSIS — X32XXXD Exposure to sunlight, subsequent encounter: Secondary | ICD-10-CM | POA: Diagnosis not present

## 2023-04-24 DIAGNOSIS — L570 Actinic keratosis: Secondary | ICD-10-CM | POA: Diagnosis not present

## 2023-04-24 DIAGNOSIS — Z1283 Encounter for screening for malignant neoplasm of skin: Secondary | ICD-10-CM | POA: Diagnosis not present

## 2023-04-24 DIAGNOSIS — D225 Melanocytic nevi of trunk: Secondary | ICD-10-CM | POA: Diagnosis not present

## 2023-05-16 DIAGNOSIS — H353221 Exudative age-related macular degeneration, left eye, with active choroidal neovascularization: Secondary | ICD-10-CM | POA: Diagnosis not present

## 2023-05-16 DIAGNOSIS — H353112 Nonexudative age-related macular degeneration, right eye, intermediate dry stage: Secondary | ICD-10-CM | POA: Diagnosis not present

## 2023-05-16 DIAGNOSIS — H43813 Vitreous degeneration, bilateral: Secondary | ICD-10-CM | POA: Diagnosis not present

## 2023-05-16 DIAGNOSIS — H35373 Puckering of macula, bilateral: Secondary | ICD-10-CM | POA: Diagnosis not present

## 2023-05-25 ENCOUNTER — Other Ambulatory Visit: Payer: Self-pay

## 2023-05-25 ENCOUNTER — Telehealth: Payer: Self-pay | Admitting: Family Medicine

## 2023-05-25 MED ORDER — ZOLPIDEM TARTRATE 10 MG PO TABS: 10.0000 mg | ORAL_TABLET | Freq: Every day | ORAL | 0 refills | Status: DC

## 2023-05-25 NOTE — Telephone Encounter (Signed)
Pt has an appt in no with aap is this okay to fill

## 2023-05-25 NOTE — Telephone Encounter (Signed)
Patient was a walk in today requesting a refill on Zolpidem 10 mg---this was given by Dr. Craige Cotta.  Patient has not been seen in the office since October 2023. Marland Kitchen  Patient was informed he would need to schedule an appointment ( 07/09/24 at  3:30 pm with Ames Dura, NP) and be sure to keep it.  Clinical staff here in  the [place] office will ask Dr. Sherene Sires if he will refill this medication.at [facility] [phone] phone # [phone]

## 2023-05-25 NOTE — Telephone Encounter (Signed)
Reminded pt to keep his follow up appt on 07/09/23

## 2023-05-25 NOTE — Telephone Encounter (Signed)
Sent in refill to pharmacy ,called  to let patient know that medication has been sent.

## 2023-06-07 ENCOUNTER — Telehealth: Payer: Self-pay | Admitting: Cardiology

## 2023-06-07 ENCOUNTER — Other Ambulatory Visit: Payer: Self-pay | Admitting: Cardiology

## 2023-06-07 NOTE — Telephone Encounter (Signed)
Per OV on 04/06/23:  3. Hyperlipidemia   - LDL above goal. With prior CABG, CEA, age, would aim for LDL <55 for high risk patient.  - d/c atorvastatin, start crestor 40mg  daily   Spoke to [place] and verbalized correct medication that pt is supposed to be taking. Melanie voiced understanding.

## 2023-06-07 NOTE — Telephone Encounter (Signed)
Pt c/o medication issue:  1. Name of Medication: Atorvastatin and Rosuvastatin  2. How are you currently taking this medication (dosage and times per day)?   3. Are you having a reaction (difficulty breathing--STAT)?   4. What is your medication issue? The pharmacist wants to know which medicine is this patient supposed to be taking?

## 2023-06-14 ENCOUNTER — Encounter: Payer: Self-pay | Admitting: Cardiology

## 2023-06-14 NOTE — Telephone Encounter (Signed)
error 

## 2023-07-04 DIAGNOSIS — H43813 Vitreous degeneration, bilateral: Secondary | ICD-10-CM | POA: Diagnosis not present

## 2023-07-04 DIAGNOSIS — H353112 Nonexudative age-related macular degeneration, right eye, intermediate dry stage: Secondary | ICD-10-CM | POA: Diagnosis not present

## 2023-07-04 DIAGNOSIS — H353221 Exudative age-related macular degeneration, left eye, with active choroidal neovascularization: Secondary | ICD-10-CM | POA: Diagnosis not present

## 2023-07-04 DIAGNOSIS — H35373 Puckering of macula, bilateral: Secondary | ICD-10-CM | POA: Diagnosis not present

## 2023-07-09 ENCOUNTER — Encounter: Payer: Self-pay | Admitting: Primary Care

## 2023-07-09 ENCOUNTER — Ambulatory Visit: Payer: Medicare HMO | Admitting: Primary Care

## 2023-07-09 VITALS — BP 132/70 | HR 74 | Ht 73.0 in | Wt 224.0 lb

## 2023-07-09 DIAGNOSIS — F5101 Primary insomnia: Secondary | ICD-10-CM

## 2023-07-09 DIAGNOSIS — G4733 Obstructive sleep apnea (adult) (pediatric): Secondary | ICD-10-CM | POA: Diagnosis not present

## 2023-07-09 MED ORDER — ZOLPIDEM TARTRATE 10 MG PO TABS: 10.0000 mg | ORAL_TABLET | Freq: Every day | ORAL | 0 refills | Status: DC

## 2023-07-09 NOTE — Patient Instructions (Signed)
Sleep apnea is well-controlled on current pressure settings, no changes needed today Continue to wear CPAP nightly 4 to 6 hours or longer Continue Ambien and gabapentin at bedtime  Rx Ambien refilled for 90 days  Follow-up 6 months with [facility] NP or sooner if needed for OSA/insomnia

## 2023-07-09 NOTE — Progress Notes (Signed)
@Patient  ID: [name] [name], male    DOB: 1941/03/28, 82 y.o.   MRN: [id number]  Chief Complaint  Patient presents with   Sleep Apnea    Referring provider: Benita Stabile, MD  HPI: 82 year old male, former smoker.  Past medical history significant for obstructive sleep apnea, insomnia, coronary artery disease, congestive heart failure, hyperlipidemia, GERD, hypertension, hypothyroidism, lumbar radiculopathy.  Former patient of Dr. Craige Cotta.   PSG 08/22/12 >> AHI 8.3, SpO2 low 88   07/09/2023 Discussed the use of AI scribe software for clinical note transcription with the patient, who gave verbal consent to proceed.  History of Present Illness   The patient, with a history of sleep apnea and insomnia, has been using a CPAP machine consistently for a long time, reporting that he cannot sleep without it. He has been using it for approximately nine hours per night with a pressure set at 10cm h20. The patient reports significant benefit from the CPAP machine.  For insomnia, the patient has been taking Ambien and Gabapentin, the latter initially prescribed for pain but now taken at bedtime to aid sleep. The patient reports that the Gabapentin does not significantly help with leg pain. He has been diagnosed with a pinched nerve in the lower back and elbow, which he plans to address surgically. The patient reports that his hand sometimes becomes numb at night, waking him up.  The patient's insomnia medication reportedly help him fall asleep. He experiences no residual grogginess the next day. He is aware not to take the medication if he will be driving and not to combine it with alcohol.  Airview download 06/09/2023 - 07/08/2023 Usage days 30/30 days (100%) greater than 4 hours Average usage 9 hours 39 minutes Pressure 10 cm H2O Air leaks 14.4 L/min (95%) AHI 1.0  No Known Allergies  Immunization History  Administered Date(s) Administered   Fluad Quad(high Dose 65+) 08/19/2021    Influenza, High Dose Seasonal PF 06/17/2018, 07/09/2019   PNEUMOCOCCAL CONJUGATE-20 08/19/2021   Tdap 10/10/2013    Past Medical History:  Diagnosis Date   Arthritis    CAD (coronary artery disease)    CAD (coronary artery disease) 12/04/2011   Left Main Disease with 3- vessel CAD    CAD (coronary artery disease)    seeing Dr Wyline Mood   CHF (congestive heart failure) (HCC)    Deviated septum    Dyslipidemia    GERD (gastroesophageal reflux disease)    Heart attack (HCC)    Hyperlipidemia    Hypertension    Hypothyroidism    MI (myocardial infarction) (HCC) 1997   Neuropathy    Obesity (BMI 30-39.9) 09/02/2018   PONV (postoperative nausea and vomiting)    has in past, no problems after most recent surgery   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   LIMA to LAD, SVG to OM2, SVG to PDA, EVH via right thigh   S/P carotid endarterectomy 02/29/2012   Right CEA for asymptomatic severe right ICA stenosis    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   Left Ear Rim (Cx3,5FU)   Shortness of breath    with exertion   Sleep apnea    CPAP, sleep study at [facility]   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   80-99% stenosis RICA, asymptomatic   Thyroid nodule    Wears glasses    or contacts    Tobacco History: Social History   Tobacco Use  Smoking Status Former   Current packs/day: 0.00   Average packs/day: 0.5  packs/day for 15.0 years (7.5 ttl pk-yrs)   Types: Cigarettes   Start date: 09/04/1986   Quit date: 09/04/2001   Years since quitting: 21.8   Passive exposure: Current  Smokeless Tobacco Never  Tobacco Comments   smoked 87yrs, smoked 1/2 pack a day   Counseling given: Not Answered Tobacco comments: smoked 40yrs, smoked 1/2 pack a day   Outpatient Medications Prior to Visit  Medication Sig Dispense Refill   amitriptyline (ELAVIL) 25 MG tablet Take 25 mg by mouth at bedtime.     amLODipine (NORVASC) 10 MG tablet Take 1 tablet (10 mg total) by mouth daily. 90 tablet 3   aspirin EC 81  MG tablet Take 81 mg by mouth in the morning.     DULoxetine (CYMBALTA) 30 MG capsule Take 1 capsule (30 mg total) by mouth daily. 30 capsule 3   esomeprazole (NEXIUM) 20 MG capsule Take 20 mg by mouth daily before breakfast.     gabapentin (NEURONTIN) 300 MG capsule Take 1 capsule (300 mg total) by mouth 2 (two) times daily. 180 capsule 1   metoprolol (LOPRESSOR) 50 MG tablet Take 1 tablet (50 mg total) by mouth 2 (two) times daily. 180 tablet 1   Multiple Vitamins-Minerals (PRESERVISION AREDS 2 PO) Take 1 tablet by mouth 2 (two) times daily.      Omega-3 Fatty Acids (FISH OIL) 1000 MG CPDR Take 1,000-2,000 mg by mouth See admin instructions. Take 2 capsules (2000 mg) by mouth once daily in the morning & take 1 capsule (1000 mg) by mouth in the evening.     ramelteon (ROZEREM) 8 MG tablet Take 1 tablet (8 mg total) by mouth at bedtime. 30 tablet 5   ramipril (ALTACE) 10 MG capsule Take 10 mg by mouth in the morning.     rosuvastatin (CRESTOR) 40 MG tablet Take 1 tablet (40 mg total) by mouth daily. 90 tablet 3   SYNTHROID 112 MCG tablet Take 112 mcg by mouth daily before breakfast.      zolpidem (AMBIEN) 10 MG tablet Take 1 tablet (10 mg total) by mouth at bedtime. 30 tablet 0   Facility-Administered Medications Prior to Visit  Medication Dose Route Frequency Provider Last Rate Last Admin   lactated ringers infusion   Intravenous Continuous PRN Julian Reil, CRNA   New Bag at 12/13/22 1142    Review of Systems  Review of Systems  Constitutional: Negative.  Negative for fatigue.  Respiratory: Negative.  Negative for apnea.   Musculoskeletal:  Positive for back pain.  Psychiatric/Behavioral:  Negative for sleep disturbance.      Physical Exam  BP 132/70   Pulse 74   Ht 6\' 1"  (1.854 m)   Wt 224 lb (101.6 kg)   SpO2 97%   BMI 29.55 kg/m  Physical Exam Constitutional:      General: He is not in acute distress.    Appearance: Normal appearance. He is not ill-appearing.  HENT:      Head: Normocephalic and atraumatic.     Mouth/Throat:     Mouth: Mucous membranes are moist.     Pharynx: Oropharynx is clear.  Cardiovascular:     Rate and Rhythm: Normal rate and regular rhythm.  Pulmonary:     Effort: Pulmonary effort is normal.     Breath sounds: Normal breath sounds.  Skin:    General: Skin is warm and dry.  Neurological:     General: No focal deficit present.     Mental Status: He  is alert and oriented to person, place, and time. Mental status is at baseline.  Psychiatric:        Mood and Affect: Mood normal.        Behavior: Behavior normal.        Thought Content: Thought content normal.        Judgment: Judgment normal.      Lab Results:  CBC    Component Value Date/Time   WBC 5.5 07/09/2019 0847   RBC 4.44 07/09/2019 0847   HGB 14.4 07/09/2019 0847   HGB 15.4 01/20/2015 1432   HCT 42.6 07/09/2019 0847   HCT 45.2 01/20/2015 1432   PLT 181 07/09/2019 0847   PLT 195 01/20/2015 1432   MCV 95.9 07/09/2019 0847   MCV 91 01/20/2015 1432   MCH 32.4 07/09/2019 0847   MCHC 33.8 07/09/2019 0847   RDW 12.4 07/09/2019 0847   RDW 13.4 01/20/2015 1432   LYMPHSABS 1.4 07/09/2019 0847   LYMPHSABS 2.1 01/20/2015 1432   MONOABS 0.5 07/09/2019 0847   EOSABS 0.2 07/09/2019 0847   EOSABS 0.4 01/20/2015 1432   BASOSABS 0.0 07/09/2019 0847   BASOSABS 0.0 01/20/2015 1432    BMET    Component Value Date/Time   NA 136 07/09/2019 0847   NA 138 09/24/2014 1016   K 4.5 07/09/2019 0847   CL 100 07/09/2019 0847   CO2 25 07/09/2019 0847   GLUCOSE 128 (H) 07/09/2019 0847   BUN 20 07/09/2019 0847   BUN 13 09/24/2014 1016   CREATININE 1.05 07/09/2019 0847   CREATININE 1.27 03/19/2013 0902   CALCIUM 9.1 07/09/2019 0847   GFRNONAA >60 07/09/2019 0847   GFRAA >60 07/09/2019 0847    BNP No results found for: "BNP"  ProBNP No results found for: "PROBNP"  Imaging: No results found.   Assessment & Plan:   1. OSA (obstructive sleep apnea)  2. Primary  insomnia      Obstructive Sleep Apnea Well controlled with CPAP use. Compliance download shows consistent use with an average of 9 hours per night and a residual apnea score of 1 event per hour. -Continue CPAP use at current settings.  Insomnia Reports benefit from current regimen of Ambien 10mg  and Gabapentin 300mg  at bedtime. No reported daytime grogginess. -Refill Ambien for 90 days. If pharmacy does not allow, will refill every 30 days.  Chronic Pain Reports Gabapentin does not significantly help with leg pain but does help with sleep. Pinched nerve in lower back and elbow identified as potential sources of pain. Plans for surgical intervention for back pain. -Continue Gabapentin 300mg  at bedtime, refilled by primary care provider.  Follow up  - 6 months or sooner if needed.         Glenford Bayley, NP 07/09/2023

## 2023-08-13 ENCOUNTER — Other Ambulatory Visit (INDEPENDENT_AMBULATORY_CARE_PROVIDER_SITE_OTHER): Payer: Self-pay | Admitting: Gastroenterology

## 2023-08-13 DIAGNOSIS — G8929 Other chronic pain: Secondary | ICD-10-CM

## 2023-08-13 DIAGNOSIS — Z23 Encounter for immunization: Secondary | ICD-10-CM | POA: Diagnosis not present

## 2023-08-28 DIAGNOSIS — H353221 Exudative age-related macular degeneration, left eye, with active choroidal neovascularization: Secondary | ICD-10-CM | POA: Diagnosis not present

## 2023-08-28 DIAGNOSIS — H43813 Vitreous degeneration, bilateral: Secondary | ICD-10-CM | POA: Diagnosis not present

## 2023-08-28 DIAGNOSIS — H35373 Puckering of macula, bilateral: Secondary | ICD-10-CM | POA: Diagnosis not present

## 2023-08-28 DIAGNOSIS — H353112 Nonexudative age-related macular degeneration, right eye, intermediate dry stage: Secondary | ICD-10-CM | POA: Diagnosis not present

## 2023-09-06 DIAGNOSIS — E782 Mixed hyperlipidemia: Secondary | ICD-10-CM | POA: Diagnosis not present

## 2023-09-06 DIAGNOSIS — E118 Type 2 diabetes mellitus with unspecified complications: Secondary | ICD-10-CM | POA: Diagnosis not present

## 2023-09-06 DIAGNOSIS — I2510 Atherosclerotic heart disease of native coronary artery without angina pectoris: Secondary | ICD-10-CM | POA: Diagnosis not present

## 2023-09-12 DIAGNOSIS — N182 Chronic kidney disease, stage 2 (mild): Secondary | ICD-10-CM | POA: Diagnosis not present

## 2023-09-12 DIAGNOSIS — G4700 Insomnia, unspecified: Secondary | ICD-10-CM | POA: Diagnosis not present

## 2023-09-12 DIAGNOSIS — E039 Hypothyroidism, unspecified: Secondary | ICD-10-CM | POA: Diagnosis not present

## 2023-09-12 DIAGNOSIS — I2510 Atherosclerotic heart disease of native coronary artery without angina pectoris: Secondary | ICD-10-CM | POA: Diagnosis not present

## 2023-09-12 DIAGNOSIS — E782 Mixed hyperlipidemia: Secondary | ICD-10-CM | POA: Diagnosis not present

## 2023-09-12 DIAGNOSIS — E875 Hyperkalemia: Secondary | ICD-10-CM | POA: Diagnosis not present

## 2023-09-12 DIAGNOSIS — I129 Hypertensive chronic kidney disease with stage 1 through stage 4 chronic kidney disease, or unspecified chronic kidney disease: Secondary | ICD-10-CM | POA: Diagnosis not present

## 2023-09-12 DIAGNOSIS — G4733 Obstructive sleep apnea (adult) (pediatric): Secondary | ICD-10-CM | POA: Diagnosis not present

## 2023-09-12 DIAGNOSIS — G990 Autonomic neuropathy in diseases classified elsewhere: Secondary | ICD-10-CM | POA: Diagnosis not present

## 2023-10-08 ENCOUNTER — Telehealth: Payer: Self-pay | Admitting: Primary Care

## 2023-10-08 NOTE — Telephone Encounter (Signed)
 Patient was a walk in today requesting a refill for Zolpidem 10 mg--would like another 3 month supply---[facility]--[phone]---patient call back---[phone]

## 2023-10-09 ENCOUNTER — Ambulatory Visit: Payer: Medicare HMO | Admitting: Cardiology

## 2023-10-09 DIAGNOSIS — G5621 Lesion of ulnar nerve, right upper limb: Secondary | ICD-10-CM | POA: Diagnosis not present

## 2023-10-09 DIAGNOSIS — R200 Anesthesia of skin: Secondary | ICD-10-CM | POA: Diagnosis not present

## 2023-10-09 DIAGNOSIS — R202 Paresthesia of skin: Secondary | ICD-10-CM | POA: Diagnosis not present

## 2023-10-10 DIAGNOSIS — H353221 Exudative age-related macular degeneration, left eye, with active choroidal neovascularization: Secondary | ICD-10-CM | POA: Diagnosis not present

## 2023-10-10 DIAGNOSIS — H35373 Puckering of macula, bilateral: Secondary | ICD-10-CM | POA: Diagnosis not present

## 2023-10-10 DIAGNOSIS — H43813 Vitreous degeneration, bilateral: Secondary | ICD-10-CM | POA: Diagnosis not present

## 2023-10-10 DIAGNOSIS — H353112 Nonexudative age-related macular degeneration, right eye, intermediate dry stage: Secondary | ICD-10-CM | POA: Diagnosis not present

## 2023-10-10 MED ORDER — ZOLPIDEM TARTRATE 10 MG PO TABS: 10.0000 mg | ORAL_TABLET | Freq: Every day | ORAL | 0 refills | Status: DC

## 2023-10-10 NOTE — Telephone Encounter (Signed)
 Sent!

## 2023-10-10 NOTE — Telephone Encounter (Signed)
 LVM informing patient medication had been called to [facility]---requested call back with questions or concerns

## 2023-10-11 ENCOUNTER — Other Ambulatory Visit: Payer: Self-pay

## 2023-10-11 ENCOUNTER — Encounter: Payer: Self-pay | Admitting: Neurology

## 2023-10-11 DIAGNOSIS — R202 Paresthesia of skin: Secondary | ICD-10-CM

## 2023-10-26 ENCOUNTER — Ambulatory Visit: Payer: Medicare HMO | Admitting: Neurology

## 2023-10-26 DIAGNOSIS — R202 Paresthesia of skin: Secondary | ICD-10-CM | POA: Diagnosis not present

## 2023-10-26 DIAGNOSIS — M5417 Radiculopathy, lumbosacral region: Secondary | ICD-10-CM

## 2023-10-26 NOTE — Procedures (Signed)
 [facility]  [address] [place], [address]  [place], [place] [address] Tel: [phone] Fax: [phone] Test Date:  10/26/2023  Patient: [name] [name] DOB: [birth year] Physician: Nita Sickle, DO  Sex: Male Height: 6\' 1"  Ref Phys: Marikay Alar, MD  ID#: [id number]   Technician:    History: This is a 83 year old man referred for evaluation of bilateral feet paresthesias and pain.  NCV & EMG Findings: Extensive electrodiagnostic testing of the right lower extremity and additional studies of the left shows:  Bilateral sural and superficial peroneal sensory responses are within normal limits. Left peroneal motor response is reduced, normal at the tibialis anterior.  Right peroneal and bilateral tibial motor responses are within normal limits. Tibial H reflex study on the right is absent, and normal on the left. Chronic motor axon loss changes are seen affecting all the tested muscles involving the L3, L4, L5, and S1 myotomes bilaterally, without accompanying active denervation.   Impression: Chronic multilevel lumbosacral radiculopathies affecting bilateral L3, L4, L5, and S1 nerve roots/segments, moderate. There is no evidence of a large fiber sensorimotor polyneuropathy affecting the lower extremities.   ___________________________ Nita Sickle, DO    Nerve Conduction Studies   Stim Site NR Peak (ms) Norm Peak (ms) O-P Amp (V) Norm O-P Amp  Left Sup Peroneal Anti Sensory (Ant Lat Mall)  32 C  12 cm    3.0 <4.6 7.9 >3  Right Sup Peroneal Anti Sensory (Ant Lat Mall)  32 C  12 cm    2.6 <4.6 6.8 >3  Left Sural Anti Sensory (Lat Mall)  32 C  Calf    3.2 <4.6 8.7 >3  Right Sural Anti Sensory (Lat Mall)  32 C  Calf    2.3 <4.6 8.4 >3     Stim Site NR Onset (ms) Norm Onset (ms) O-P Amp (mV) Norm O-P Amp Site1 Site2 Delta-0 (ms) Dist (cm) Vel (m/s) Norm Vel (m/s)  Left Peroneal Motor (Ext Dig Brev)  32 C  Ankle    4.1 <6.0 *1.8 >2.5 B Fib Ankle 9.2 40.0 43 >40   B Fib    13.3  1.4  Poplt B Fib 1.9 9.0 47 >40  Poplt    15.2  1.3         Right Peroneal Motor (Ext Dig Brev)  32 C  Ankle    3.5 <6.0 2.7 >2.5 B Fib Ankle 8.3 39.0 47 >40  B Fib    11.8  2.6  Poplt B Fib 1.7 9.0 53 >40  Poplt    13.5  2.6         Left Peroneal TA Motor (Tib Ant)  32 C  Fib Head    2.9 <4.5 4.3 >3 Poplit Fib Head 1.6 9.0 56 >40  Poplit    4.5 <5.7 4.2         Left Tibial Motor (Abd Hall Brev)  32 C  Ankle    4.3 <6.0 7.4 >4 Knee Ankle 9.5 42.0 44 >40  Knee    13.8  5.8         Right Tibial Motor (Abd Hall Brev)  32 C  Ankle    3.4 <6.0 5.7 >4 Knee Ankle 9.5 41.0 43 >40  Knee    12.9  3.2          Electromyography   Side Muscle Ins.Act Fibs Fasc Recrt Amp Dur Poly Activation Comment  Right AntTibialis Nml Nml Nml *1- *1+ *1+ *1+ Nml N/A  Right Gastroc Nml Nml Nml *1- *1+ *1+ *1+ Nml N/A  Right Flex Dig Long Nml Nml Nml *2- *1+ *1+ *1+ Nml N/A  Right AdductorLong Nml Nml Nml *1- *1+ *1+ *1+ Nml N/A  Right RectFemoris Nml Nml Nml *1- *1+ *1+ *1+ Nml N/A  Right BicepsFemS Nml Nml Nml *1- *1+ *1+ *1+ Nml N/A  Right GluteusMed Nml Nml Nml *1- *1+ *1+ *1+ Nml N/A  Left AntTibialis Nml Nml Nml *1- *1+ *1+ *1+ Nml N/A  Left Gastroc Nml Nml Nml *1- *1+ *1+ *1+ Nml N/A  Left Flex Dig Long Nml Nml Nml *2- *1+ *1+ *1+ Nml N/A  Left RectFemoris Nml Nml Nml *1- *1+ *1+ *1+ Nml N/A  Left GluteusMed Nml Nml Nml *1- *1+ *1+ *1+ Nml N/A      Waveforms:

## 2023-11-02 ENCOUNTER — Encounter: Payer: Medicare HMO | Admitting: Neurology

## 2023-11-21 DIAGNOSIS — H353112 Nonexudative age-related macular degeneration, right eye, intermediate dry stage: Secondary | ICD-10-CM | POA: Diagnosis not present

## 2023-11-21 DIAGNOSIS — H35373 Puckering of macula, bilateral: Secondary | ICD-10-CM | POA: Diagnosis not present

## 2023-11-21 DIAGNOSIS — H353221 Exudative age-related macular degeneration, left eye, with active choroidal neovascularization: Secondary | ICD-10-CM | POA: Diagnosis not present

## 2023-11-21 DIAGNOSIS — H43813 Vitreous degeneration, bilateral: Secondary | ICD-10-CM | POA: Diagnosis not present

## 2023-12-13 ENCOUNTER — Ambulatory Visit: Admitting: Neurology

## 2023-12-13 DIAGNOSIS — R202 Paresthesia of skin: Secondary | ICD-10-CM | POA: Diagnosis not present

## 2023-12-13 DIAGNOSIS — G5621 Lesion of ulnar nerve, right upper limb: Secondary | ICD-10-CM

## 2023-12-13 NOTE — Procedures (Signed)
  [facility]  [address] [place], [address]  [place], [place] [address] Tel: [phone] Fax: [phone] Test Date:  12/13/2023  Patient: [name] [name] DOB: Apr 09, 1941 Physician: Reyna Cava, DO  Sex: Male Height: 6\' 1"  Ref Phys: Waymond Hailey, MD  ID#: [id number]   Technician:    History: This is a 83 year old man referred for evaluation of right hand weakness.  NCV & EMG Findings: Extensive electrodiagnostic testing of the right upper extremity shows:  The right median and median palmar sensory responses are within normal limits.  Right ulnar sensory responses are absent. Right median motor response is within normal limits.  Right ulnar motor response shows prolonged latency (3.6 ms), markedly reduced amplitude (1.4 mV), and decreased conduction velocity across the elbow (A Elbow-B Elbow, 29 m/s).   Severe chronic motor axonal loss changes are seen affecting the ulnar innervated muscles, without accompanying active denervation.  Impression: Right ulnar neuropathy with slowing across the elbow, with demyelinating and axonal features, very severe. There is no evidence of a cervical radiculopathy or carpal tunnel syndrome affecting the right upper extremity.   ___________________________ Reyna Cava, DO    Nerve Conduction Studies   Stim Site NR Peak (ms) Norm Peak (ms) O-P Amp (V) Norm O-P Amp  Right Median Anti Sensory (2nd Digit)  32 C  Wrist    3.7 <3.8 13.8 >10  Right Ulnar Anti Sensory (5th Digit)  32 C  Wrist *NR  <3.2  >5     Stim Site NR Onset (ms) Norm Onset (ms) O-P Amp (mV) Norm O-P Amp Site1 Site2 Delta-0 (ms) Dist (cm) Vel (m/s) Norm Vel (m/s)  Right Median Motor (Abd Poll Brev)  32 C  Wrist    3.8 <4.0 6.6 >5 Elbow Wrist 5.3 32.0 60 >50  Elbow    9.1  5.9         Right Ulnar Motor (Abd Dig Minimi)  32 C  Wrist    *3.6 <3.1 *1.4 >7 B Elbow Wrist 4.6 23.0 50 >50  B Elbow    8.2  1.1  A Elbow B Elbow 3.5 10.0 *29 >50  A Elbow    11.7  0.9             Stim Site NR Peak (ms) Norm Peak (ms) P-T Amp (V) Site1 Site2 Delta-P (ms) Norm Delta (ms)  Right Median/Ulnar Palm Comparison (Wrist - 8cm)  32 C  Median Palm    2.1 <2.2 21.9 Median Palm Ulnar Palm    Ulnar Palm *NR  <2.2        Electromyography   Side Muscle Ins.Act Fibs Fasc Recrt Amp Dur Poly Activation Comment  Right 1stDorInt Nml Nml Nml *SMU *1+ *1+ *1+ Nml *ATR  Right PronatorTeres Nml Nml Nml Nml Nml Nml Nml Nml N/A  Right Biceps Nml Nml Nml Nml Nml Nml Nml Nml N/A  Right Triceps Nml Nml Nml Nml Nml Nml Nml Nml N/A  Right Deltoid Nml Nml Nml Nml Nml Nml Nml Nml N/A  Right Abd Dig Min Nml Nml Nml *SMU *1+ *1+ *1+ Nml *ATR  Right FlexCarpiUln Nml Nml Nml *SMU *1+ *1+ *1+ Nml N/A      Waveforms:

## 2023-12-20 DIAGNOSIS — G5621 Lesion of ulnar nerve, right upper limb: Secondary | ICD-10-CM | POA: Diagnosis not present

## 2023-12-20 DIAGNOSIS — Z6827 Body mass index (BMI) 27.0-27.9, adult: Secondary | ICD-10-CM | POA: Diagnosis not present

## 2023-12-20 DIAGNOSIS — M48062 Spinal stenosis, lumbar region with neurogenic claudication: Secondary | ICD-10-CM | POA: Diagnosis not present

## 2023-12-31 ENCOUNTER — Other Ambulatory Visit (HOSPITAL_COMMUNITY): Payer: Self-pay | Admitting: Neurological Surgery

## 2023-12-31 DIAGNOSIS — M48062 Spinal stenosis, lumbar region with neurogenic claudication: Secondary | ICD-10-CM

## 2023-12-31 DIAGNOSIS — G5621 Lesion of ulnar nerve, right upper limb: Secondary | ICD-10-CM | POA: Diagnosis not present

## 2024-01-09 ENCOUNTER — Ambulatory Visit (HOSPITAL_COMMUNITY)
Admission: RE | Admit: 2024-01-09 | Discharge: 2024-01-09 | Disposition: A | Discharge status: Home / Self Care | Source: Ambulatory Visit | Attending: Neurological Surgery | Admitting: Neurological Surgery

## 2024-01-09 DIAGNOSIS — M48062 Spinal stenosis, lumbar region with neurogenic claudication: Secondary | ICD-10-CM | POA: Insufficient documentation

## 2024-01-09 DIAGNOSIS — M5135 Other intervertebral disc degeneration, thoracolumbar region: Secondary | ICD-10-CM | POA: Diagnosis not present

## 2024-01-09 DIAGNOSIS — M47816 Spondylosis without myelopathy or radiculopathy, lumbar region: Secondary | ICD-10-CM | POA: Diagnosis not present

## 2024-01-09 DIAGNOSIS — M5127 Other intervertebral disc displacement, lumbosacral region: Secondary | ICD-10-CM | POA: Diagnosis not present

## 2024-01-09 DIAGNOSIS — M5126 Other intervertebral disc displacement, lumbar region: Secondary | ICD-10-CM | POA: Diagnosis not present

## 2024-01-22 ENCOUNTER — Ambulatory Visit: Payer: Medicare HMO | Admitting: Cardiology

## 2024-01-22 DIAGNOSIS — L821 Other seborrheic keratosis: Secondary | ICD-10-CM | POA: Diagnosis not present

## 2024-01-22 DIAGNOSIS — X32XXXD Exposure to sunlight, subsequent encounter: Secondary | ICD-10-CM | POA: Diagnosis not present

## 2024-01-22 DIAGNOSIS — L728 Other follicular cysts of the skin and subcutaneous tissue: Secondary | ICD-10-CM | POA: Diagnosis not present

## 2024-01-22 DIAGNOSIS — L570 Actinic keratosis: Secondary | ICD-10-CM | POA: Diagnosis not present

## 2024-02-04 ENCOUNTER — Ambulatory Visit (INDEPENDENT_AMBULATORY_CARE_PROVIDER_SITE_OTHER): Admitting: Primary Care

## 2024-02-04 ENCOUNTER — Encounter: Payer: Self-pay | Admitting: Primary Care

## 2024-02-04 VITALS — BP 134/65 | HR 98 | Ht 73.0 in | Wt 209.0 lb

## 2024-02-04 DIAGNOSIS — G4733 Obstructive sleep apnea (adult) (pediatric): Secondary | ICD-10-CM | POA: Diagnosis not present

## 2024-02-04 DIAGNOSIS — F5101 Primary insomnia: Secondary | ICD-10-CM

## 2024-02-04 DIAGNOSIS — G4700 Insomnia, unspecified: Secondary | ICD-10-CM

## 2024-02-04 MED ORDER — ZOLPIDEM TARTRATE 10 MG PO TABS: 10.0000 mg | ORAL_TABLET | Freq: Every day | ORAL | 0 refills | Status: DC

## 2024-02-04 NOTE — Patient Instructions (Addendum)
  VISIT SUMMARY: You came in today for a follow-up appointment and to refill your medication. We discussed your history of mild sleep apnea and your current use of a CPAP machine, which you have been using for 10 to 15 years. You also mentioned that you have been taking Ambien  for sleep but have not used it for a couple of months. You have been using chewable melatonin instead, which you find helpful. We also addressed the issue with your current medical supply store, [facility], no longer covering your CPAP supplies due to insurance issues with [employer].  YOUR PLAN: -OBSTRUCTIVE SLEEP APNEA: Obstructive sleep apnea is a condition where your breathing stops and starts repeatedly during sleep. It is currently managed with your CPAP machine, which you use consistently for 8-9 hours per night. We will send an order for CPAP supplies to a new medical supply company that accepts [facility] [employer]. Additionally, we have prescribed a 90-day supply of generic zolpidem  10 mg for sleep, to be taken at bedtime. Please call a few days before your zolpidem  prescription is due for renewal. The pharmacy will send the electronic prescription for zolpidem  to [facility].  INSTRUCTIONS: Please call a few days before your zolpidem  prescription is due for renewal. The pharmacy will send the electronic prescription for zolpidem  to [facility].  Follow-up 1 year with APP in [place]

## 2024-02-04 NOTE — Progress Notes (Signed)
 @Patient  ID: [name] [name], male    DOB: 1940/09/05, 83 y.o.   MRN: [id number]  Chief Complaint  Patient presents with   Follow-up    Out of sleep meds - needs new cpap supply from different dme    Referring provider: Shona Norleen PEDLAR, MD  HPI:  83 year old male, former smoker.  Past medical history significant for obstructive sleep apnea, insomnia, coronary artery disease, congestive heart failure, hyperlipidemia, GERD, hypertension, hypothyroidism, lumbar radiculopathy.  Former patient of Dr. Shellia.   PSG 08/22/12 >> AHI 8.3, SpO2 low 88   07/09/2023 Discussed the use of AI scribe software for clinical note transcription with the patient, who gave verbal consent to proceed.  History of Present Illness   The patient, with a history of sleep apnea and insomnia, has been using a CPAP machine consistently for a long time, reporting that he cannot sleep without it. He has been using it for approximately nine hours per night with a pressure set at 10cm h20. The patient reports significant benefit from the CPAP machine.  For insomnia, the patient has been taking Ambien  and Gabapentin , the latter initially prescribed for pain but now taken at bedtime to aid sleep. The patient reports that the Gabapentin  does not significantly help with leg pain. He has been diagnosed with a pinched nerve in the lower back and elbow, which he plans to address surgically. The patient reports that his hand sometimes becomes numb at night, waking him up.  The patient's insomnia medication reportedly help him fall asleep. He experiences no residual grogginess the next day. He is aware not to take the medication if he will be driving and not to combine it with alcohol.  Airview download 06/09/2023 - 07/08/2023 Usage days 30/30 days (100%) greater than 4 hours Average usage 9 hours 39 minutes Pressure 10 cm H2O Air leaks 14.4 L/min (95%) AHI 1.0  02/04/2024- interim hx  Discussed the use of AI scribe software  for clinical note transcription with the patient, who gave verbal consent to proceed.  History of Present Illness   [name] [name] is an 83 year old male who presents for follow-up and medication refill.  He has a history of mild sleep apnea diagnosed in January 2014, with a sleep study showing an average apnea index of 8.3 apneic events per hour and a lowest oxygen saturation of 88%. He has been using a CPAP machine for 10 to 15 years, set at a pressure of 10 cm H2O, and uses it for approximately 8 to 9 hours per night. He notes a significant benefit from the CPAP, stating he 'can't sleep without it'.  He has been taking Ambien  (zolpidem ) 10 mg at bedtime for sleep but has not had any for a couple of months. When he takes Ambien , it usually helps him fall asleep, sometimes immediately and other times within an hour. Without Ambien , he might sleep for only an hour. He has also been using chewable melatonin, taking two or three tablets, which he finds helpful. No grogginess the next day after taking Ambien .  [employer], his medical supply store, is no longer covering his CPAP supplies due to issues with [employer] not paying. He requires a new medical supply store that accepts [facility].  Airview download 01/05/24-02/03/24 Usage days 30/30 days (100%) > 4 hours Average usage days used 8 hours 25 mins Pressure 10cm h20 Airleaks 26.5L/min (95%) AHI 0.7     Not on File  Immunization History  Administered Date(s)  Administered   Fluad Quad(high Dose 65+) 08/19/2021   Influenza, High Dose Seasonal PF 06/17/2018, 07/09/2019   PNEUMOCOCCAL CONJUGATE-20 08/19/2021   Tdap 10/10/2013    Past Medical History:  Diagnosis Date   Arthritis    CAD (coronary artery disease)    CAD (coronary artery disease) 12/04/2011   Left Main Disease with 3- vessel CAD    CAD (coronary artery disease)    seeing Dr Alvan   CHF (congestive heart failure) (HCC)    Deviated septum    Dyslipidemia     GERD (gastroesophageal reflux disease)    Heart attack (HCC)    Hyperlipidemia    Hypertension    Hypothyroidism    MI (myocardial infarction) (HCC) 1997   Neuropathy    Obesity (BMI 30-39.9) 09/02/2018   PONV (postoperative nausea and vomiting)    has in past, no problems after most recent surgery   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   LIMA to LAD, SVG to OM2, SVG to PDA, EVH via right thigh   S/P carotid endarterectomy 02/29/2012   Right CEA for asymptomatic severe right ICA stenosis    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   Left Ear Rim (Cx3,5FU)   Shortness of breath    with exertion   Sleep apnea    CPAP, sleep study at [facility]   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   80-99% stenosis RICA, asymptomatic   Thyroid  nodule    Wears glasses    or contacts    Tobacco History: Social History   Tobacco Use  Smoking Status Former   Current packs/day: 0.00   Average packs/day: 0.5 packs/day for 15.0 years (7.5 ttl pk-yrs)   Types: Cigarettes   Start date: 09/04/1986   Quit date: 09/04/2001   Years since quitting: 22.4   Passive exposure: Current  Smokeless Tobacco Never  Tobacco Comments   smoked 70yrs, smoked 1/2 pack a day   Counseling given: Not Answered Tobacco comments: smoked 23yrs, smoked 1/2 pack a day   Outpatient Medications Prior to Visit  Medication Sig Dispense Refill   amitriptyline (ELAVIL) 25 MG tablet Take 25 mg by mouth at bedtime.     amLODipine  (NORVASC ) 10 MG tablet Take 1 tablet (10 mg total) by mouth daily. 90 tablet 3   aspirin  EC 81 MG tablet Take 81 mg by mouth in the morning.     DULoxetine  (CYMBALTA ) 30 MG capsule Take 1 capsule (30 mg total) by mouth daily. 30 capsule 3   esomeprazole (NEXIUM) 20 MG capsule Take 20 mg by mouth daily before breakfast.     gabapentin  (NEURONTIN ) 300 MG capsule TAKE (1) CAPSULE BY MOUTH TWICE DAILY. 180 capsule 1   metoprolol  (LOPRESSOR ) 50 MG tablet Take 1 tablet (50 mg total) by mouth 2 (two) times daily. 180  tablet 1   Multiple Vitamins-Minerals (PRESERVISION AREDS 2 PO) Take 1 tablet by mouth 2 (two) times daily.      Omega-3 Fatty Acids (FISH OIL) 1000 MG CPDR Take 1,000-2,000 mg by mouth See admin instructions. Take 2 capsules (2000 mg) by mouth once daily in the morning & take 1 capsule (1000 mg) by mouth in the evening.     ramipril (ALTACE) 10 MG capsule Take 10 mg by mouth in the morning.     rosuvastatin  (CRESTOR ) 40 MG tablet Take 1 tablet (40 mg total) by mouth daily. 90 tablet 3   SYNTHROID  112 MCG tablet Take 112 mcg by mouth daily before breakfast.  zolpidem  (AMBIEN ) 10 MG tablet Take 1 tablet (10 mg total) by mouth at bedtime. 90 tablet 0   Facility-Administered Medications Prior to Visit  Medication Dose Route Frequency Provider Last Rate Last Admin   lactated ringers  infusion   Intravenous Continuous PRN Eliodoro Deward FALCON, CRNA   New Bag at 12/13/22 1142   Review of Systems  Review of Systems  Constitutional: Negative.   HENT: Negative.    Respiratory: Negative.    Psychiatric/Behavioral:  Negative for sleep disturbance.      Physical Exam  BP 134/65   Pulse 98   Ht 6' 1 (1.854 m)   Wt 209 lb (94.8 kg)   SpO2 96%   BMI 27.57 kg/m  Physical Exam Constitutional:      Appearance: Normal appearance.  HENT:     Head: Normocephalic and atraumatic.   Cardiovascular:     Rate and Rhythm: Normal rate and regular rhythm.  Pulmonary:     Effort: Pulmonary effort is normal.     Breath sounds: Normal breath sounds.   Musculoskeletal:        General: Normal range of motion.   Skin:    General: Skin is warm and dry.   Neurological:     General: No focal deficit present.     Mental Status: He is alert and oriented to person, place, and time. Mental status is at baseline.   Psychiatric:        Mood and Affect: Mood normal.        Behavior: Behavior normal.        Thought Content: Thought content normal.        Judgment: Judgment normal.      Lab  Results:  CBC    Component Value Date/Time   WBC 5.5 07/09/2019 0847   RBC 4.44 07/09/2019 0847   HGB 14.4 07/09/2019 0847   HGB 15.4 01/20/2015 1432   HCT 42.6 07/09/2019 0847   HCT 45.2 01/20/2015 1432   PLT 181 07/09/2019 0847   PLT 195 01/20/2015 1432   MCV 95.9 07/09/2019 0847   MCV 91 01/20/2015 1432   MCH 32.4 07/09/2019 0847   MCHC 33.8 07/09/2019 0847   RDW 12.4 07/09/2019 0847   RDW 13.4 01/20/2015 1432   LYMPHSABS 1.4 07/09/2019 0847   LYMPHSABS 2.1 01/20/2015 1432   MONOABS 0.5 07/09/2019 0847   EOSABS 0.2 07/09/2019 0847   EOSABS 0.4 01/20/2015 1432   BASOSABS 0.0 07/09/2019 0847   BASOSABS 0.0 01/20/2015 1432    BMET    Component Value Date/Time   NA 136 07/09/2019 0847   NA 138 09/24/2014 1016   K 4.5 07/09/2019 0847   CL 100 07/09/2019 0847   CO2 25 07/09/2019 0847   GLUCOSE 128 (H) 07/09/2019 0847   BUN 20 07/09/2019 0847   BUN 13 09/24/2014 1016   CREATININE 1.05 07/09/2019 0847   CREATININE 1.27 03/19/2013 0902   CALCIUM  9.1 07/09/2019 0847   GFRNONAA >60 07/09/2019 0847   GFRAA >60 07/09/2019 0847    BNP No results found for: BNP  ProBNP No results found for: PROBNP  Imaging: No results found.   Assessment & Plan:  1. OSA (obstructive sleep apnea) (Primary) - Ambulatory Referral for DME  2. Primary insomnia   Assessment and Plan    Obstructive Sleep Apnea Chronic obstructive sleep apnea, initially diagnosed in January 2014 with mild severity, characterized by an apnea index of 8.3 events per hour and a nadir oxygen saturation of 88%.  Currently managed with CPAP therapy. He is 100% compliant with CPAP use over the last 30 days > 4 hour. He uses consistently for 8-9 hours per night. The CPAP pressure is set at 10 cm H2O, resulting in an improved apnea score of 0.7. He reports significant benefit from CPAP use and cannot sleep without it. There is an issue with the current DME supplier, [facility], as he is no longer providing  supplies due to insurance coverage issues with [employer]. - Send order for CPAP supplies to a new DME company that accepts [facility]. - Advised patient continue to wear CPAP nightly 4-6 hours or longer  Insomnia Reports benefit from current regimen of Ambien  10mg  and Gabapentin  300mg  at bedtime. No reported daytime grogginess. - Prescribe 90-day supply of generic zolpidem  10 mg for sleep, to be taken at bedtime. - Instruct him to call a few days before zolpidem  prescription is due for renewal.  Chronic Pain Reports Gabapentin  does not significantly help with leg pain but does help with sleep. Pinched nerve in lower back and elbow identified as potential sources of pain. Plans for surgical intervention for back pain. -Continue Gabapentin  300mg  at bedtime, refilled by primary care provider.   Almarie LELON Ferrari, NP 02/04/2024

## 2024-02-11 DIAGNOSIS — M48062 Spinal stenosis, lumbar region with neurogenic claudication: Secondary | ICD-10-CM | POA: Diagnosis not present

## 2024-02-21 DIAGNOSIS — R300 Dysuria: Secondary | ICD-10-CM | POA: Diagnosis not present

## 2024-02-28 DIAGNOSIS — R350 Frequency of micturition: Secondary | ICD-10-CM | POA: Diagnosis not present

## 2024-02-28 DIAGNOSIS — R351 Nocturia: Secondary | ICD-10-CM | POA: Diagnosis not present

## 2024-02-28 DIAGNOSIS — N400 Enlarged prostate without lower urinary tract symptoms: Secondary | ICD-10-CM | POA: Diagnosis not present

## 2024-02-28 DIAGNOSIS — N401 Enlarged prostate with lower urinary tract symptoms: Secondary | ICD-10-CM | POA: Diagnosis not present

## 2024-03-05 DIAGNOSIS — I2510 Atherosclerotic heart disease of native coronary artery without angina pectoris: Secondary | ICD-10-CM | POA: Diagnosis not present

## 2024-03-05 DIAGNOSIS — E782 Mixed hyperlipidemia: Secondary | ICD-10-CM | POA: Diagnosis not present

## 2024-03-05 DIAGNOSIS — E118 Type 2 diabetes mellitus with unspecified complications: Secondary | ICD-10-CM | POA: Diagnosis not present

## 2024-03-11 DIAGNOSIS — E782 Mixed hyperlipidemia: Secondary | ICD-10-CM | POA: Diagnosis not present

## 2024-03-11 DIAGNOSIS — E1122 Type 2 diabetes mellitus with diabetic chronic kidney disease: Secondary | ICD-10-CM | POA: Diagnosis not present

## 2024-03-11 DIAGNOSIS — I25810 Atherosclerosis of coronary artery bypass graft(s) without angina pectoris: Secondary | ICD-10-CM | POA: Diagnosis not present

## 2024-03-11 DIAGNOSIS — E875 Hyperkalemia: Secondary | ICD-10-CM | POA: Diagnosis not present

## 2024-03-11 DIAGNOSIS — N400 Enlarged prostate without lower urinary tract symptoms: Secondary | ICD-10-CM | POA: Diagnosis not present

## 2024-03-11 DIAGNOSIS — I129 Hypertensive chronic kidney disease with stage 1 through stage 4 chronic kidney disease, or unspecified chronic kidney disease: Secondary | ICD-10-CM | POA: Diagnosis not present

## 2024-03-11 DIAGNOSIS — I2510 Atherosclerotic heart disease of native coronary artery without angina pectoris: Secondary | ICD-10-CM | POA: Diagnosis not present

## 2024-03-11 DIAGNOSIS — N1831 Chronic kidney disease, stage 3a: Secondary | ICD-10-CM | POA: Diagnosis not present

## 2024-03-11 DIAGNOSIS — E039 Hypothyroidism, unspecified: Secondary | ICD-10-CM | POA: Diagnosis not present

## 2024-03-19 ENCOUNTER — Other Ambulatory Visit: Payer: Self-pay | Admitting: Cardiology

## 2024-03-19 DIAGNOSIS — E782 Mixed hyperlipidemia: Secondary | ICD-10-CM

## 2024-03-24 DIAGNOSIS — Z6827 Body mass index (BMI) 27.0-27.9, adult: Secondary | ICD-10-CM | POA: Diagnosis not present

## 2024-03-24 DIAGNOSIS — M48062 Spinal stenosis, lumbar region with neurogenic claudication: Secondary | ICD-10-CM | POA: Diagnosis not present

## 2024-03-25 ENCOUNTER — Telehealth: Payer: Self-pay

## 2024-03-25 NOTE — Telephone Encounter (Signed)
   Pre-operative Risk Assessment    Patient Name: [name] [name]  DOB: [birth year] MRN: [id number]   Date of last office visit: 04/06/23 DORN ROSS, MD Date of next office visit: NONE   Request for Surgical Clearance    Procedure:  L3-4, L4-5, L5-S1 LUMBAR LAMINECTOMY  Date of Surgery:  Clearance TBD                                Surgeon:  ALM MOLT, MD Surgeon's Group or Practice Name:  [facility] Phone number:  [phone] Fax number:  [phone]   Type of Clearance Requested:   - Medical  - Pharmacy:  Hold Aspirin      Type of Anesthesia:  General    Additional requests/questions:    SignedLucie DELENA Ku   03/25/2024, 7:53 AM

## 2024-03-26 NOTE — Telephone Encounter (Signed)
 Patient is scheduled to see Dr. Alvan 03/27/2024.

## 2024-03-26 NOTE — Telephone Encounter (Signed)
   Name: [name] [name]  DOB: 09-04-1940  MRN: [id number]  Primary Cardiologist: Alvan Carrier, MD  Chart reviewed as part of pre-operative protocol coverage. Because of [name] [name]'s past medical history and time since last visit, he will require a follow-up in-office visit in order to better assess preoperative cardiovascular risk. Last seen by Dr. Carrier Alvan 04/06/2023  Pre-op covering staff: - Please schedule appointment and call patient to inform them. If patient already had an upcoming appointment within acceptable timeframe, please add pre-op clearance to the appointment notes so provider is aware. - Please contact requesting surgeon's office via preferred method (i.e, phone, fax) to inform them of need for appointment prior to surgery.  This message will also be routed to pharmacy pool and/or Dr Alvan for input on holding aspirin  as requested below so that this information is available to the clearing provider at time of patient's appointment.   Lamarr Satterfield, NP  03/26/2024, 8:35 AM

## 2024-03-27 ENCOUNTER — Ambulatory Visit: Admitting: Cardiology

## 2024-03-27 NOTE — Progress Notes (Deleted)
 Clinical Summary [name] [name] is a 83 y.o.male  seen today for follow up of the following medical problems.    1. CAD   - prior 3 vessel CABG July 2013, LVEF around that time by LV gram was 55%   - has had chronic MSK chest pain since his CABG that is unchanged   -no chest pains, no SOB/DOE - compliant with meds   2. Carotid stenosis   - prior right CEA July 2013   - followed by vascular surgery, last visit 11/2022     3. HTN    - he is compliant with meds       4. Hyperlipidemia   - compliant with statin, labs followed by pcp  - up 20 lbs since Jan 2024 - 02/2023 TC 136 TG 137 HDL 35 LDL 77   5. OSA   -followed by Dr Shellia   6. Syncope - episode while waiting on a trump rally - limited oral intake that tday, warm humid day. Waiting about 1 hour outside to get in. - felt lightheaded all of sudden, no other symptoms - reports when EMS evlauated him was told his bp was low - seen at [facility] ER, we are requesting records. He reports extensive testing, told he was dehydrated.     7. Preoperative evaluation - plans for possible back surgery   Wife recent hip fracture, completed surgery. Discharged to rehab, now home. Wife dealing with some fluid on the brain, extended stay at [facility]  Past Medical History:  Diagnosis Date   Arthritis    CAD (coronary artery disease)    CAD (coronary artery disease) 12/04/2011   Left Main Disease with 3- vessel CAD    CAD (coronary artery disease)    seeing Dr Alvan   CHF (congestive heart failure) (HCC)    Deviated septum    Dyslipidemia    GERD (gastroesophageal reflux disease)    Heart attack (HCC)    Hyperlipidemia    Hypertension    Hypothyroidism    MI (myocardial infarction) (HCC) 1997   Neuropathy    Obesity (BMI 30-39.9) 09/02/2018   PONV (postoperative nausea and vomiting)    has in past, no problems after most recent surgery   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   LIMA to LAD, SVG to OM2, SVG to PDA, EVH via  right thigh   S/P carotid endarterectomy 02/29/2012   Right CEA for asymptomatic severe right ICA stenosis    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   Left Ear Rim (Cx3,5FU)   Shortness of breath    with exertion   Sleep apnea    CPAP, sleep study at [facility]   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   80-99% stenosis RICA, asymptomatic   Thyroid  nodule    Wears glasses    or contacts     Not on File   Current Outpatient Medications  Medication Sig Dispense Refill   amitriptyline (ELAVIL) 25 MG tablet Take 25 mg by mouth at bedtime.     amLODipine  (NORVASC ) 10 MG tablet Take 1 tablet (10 mg total) by mouth daily. 90 tablet 3   aspirin  EC 81 MG tablet Take 81 mg by mouth in the morning.     DULoxetine  (CYMBALTA ) 30 MG capsule Take 1 capsule (30 mg total) by mouth daily. 30 capsule 3   esomeprazole (NEXIUM) 20 MG capsule Take 20 mg by mouth daily before breakfast.     gabapentin  (NEURONTIN ) 300  MG capsule TAKE (1) CAPSULE BY MOUTH TWICE DAILY. 180 capsule 1   metoprolol  (LOPRESSOR ) 50 MG tablet Take 1 tablet (50 mg total) by mouth 2 (two) times daily. 180 tablet 1   Multiple Vitamins-Minerals (PRESERVISION AREDS 2 PO) Take 1 tablet by mouth 2 (two) times daily.      Omega-3 Fatty Acids (FISH OIL) 1000 MG CPDR Take 1,000-2,000 mg by mouth See admin instructions. Take 2 capsules (2000 mg) by mouth once daily in the morning & take 1 capsule (1000 mg) by mouth in the evening.     ramipril (ALTACE) 10 MG capsule Take 10 mg by mouth in the morning.     rosuvastatin  (CRESTOR ) 40 MG tablet TAKE ONE TABLET BY MOUTH EVERY DAY 90 tablet 0   SYNTHROID  112 MCG tablet Take 112 mcg by mouth daily before breakfast.      zolpidem  (AMBIEN ) 10 MG tablet Take 1 tablet (10 mg total) by mouth at bedtime. 90 tablet 0   No current facility-administered medications for this visit.   Facility-Administered Medications Ordered in Other Visits  Medication Dose Route Frequency Provider Last Rate Last Admin    lactated ringers  infusion   Intravenous Continuous PRN Eliodoro Deward FALCON, CRNA   New Bag at 12/13/22 1142     Past Surgical History:  Procedure Laterality Date   ADENOIDECTOMY     CARDIAC CATHETERIZATION     02/22/12   CARDIOVASCULAR STRESS TEST  7/13   CAROTID ENDARTERECTOMY     COLONOSCOPY  15-20years and 12/04/11   Done at [facility]   COLONOSCOPY  12/11/2011   Procedure: COLONOSCOPY;  Surgeon: Claudis RAYMOND Rivet, MD;  [facility];  Service: Endoscopy;  Laterality: N/A;  830   COLONOSCOPY WITH PROPOFOL  N/A 11/29/2021   Procedure: COLONOSCOPY WITH PROPOFOL ;  Surgeon: Eartha Angelia Sieving, MD;  Location: [facility];  Service: Gastroenterology;  Laterality: N/A;  1105 ASA 2   CORONARY ARTERY BYPASS GRAFT  02/29/2012   Procedure: CORONARY ARTERY BYPASS GRAFTING (CABG);  Surgeon: Sudie VEAR Laine, MD;  Location: [facility] OR;  Service: Open Heart Surgery;  Laterality: N/A;  times three using Left Internal Mammary Artery and Right Greater Saphenous Vein Graft Harvested Endoscopically   CORONARY STENT PLACEMENT     ENDARTERECTOMY  02/29/2012   Procedure: ENDARTERECTOMY CAROTID;  Surgeon: Lonni GORMAN Blade, MD;  Location: [facility] OR;  Service: Vascular;  Laterality: Right;   FOOT SURGERY  06/30/11   artificial joint in big toe right foot    KNEE ARTHROSCOPY WITH MEDIAL MENISECTOMY Left 07/15/2019   Procedure: KNEE ARTHROSCOPY WITH MEDIAL MENISCECTOMY AND LATERAL MENISCECTOMY;  Surgeon: Margrette Taft BRAVO, MD;  Location: [facility] ORS;  Service: Orthopedics;  Laterality: Left;   LEFT HEART CATHETERIZATION WITH CORONARY ANGIOGRAM N/A 02/20/2012   Procedure: LEFT HEART CATHETERIZATION WITH CORONARY ANGIOGRAM;  Surgeon: Debby DELENA Sor, MD;  Location: [facility] CATH LAB;  Service: Cardiovascular;  Laterality: N/A;   NASAL SEPTUM SURGERY     Totol thyroidectomy  02/06/2011   TRANSESOPHAGEAL ECHOCARDIOGRAM     UPPER GASTROINTESTINAL ENDOSCOPY  35-40 years ago   [facility]     Not on  File    Family History  Problem Relation Age of Onset   Hypertension Mother    Healthy Sister    Healthy Brother    Healthy Son    Heart attack Father    Heart disease Father        Before age 42   Hyperlipidemia Father  Hypertension Father    Prostate cancer Father    Colon cancer Neg Hx      Social History Mr. Hanauer reports that he quit smoking about 22 years ago. His smoking use included cigarettes. He started smoking about 37 years ago. He has a 7.5 pack-year smoking history. He has been exposed to tobacco smoke. He has never used smokeless tobacco. Mr. Mayabb reports that he does not currently use alcohol after a past usage of about 3.0 - 4.0 standard drinks of alcohol per week.   Review of Systems CONSTITUTIONAL: No weight loss, fever, chills, weakness or fatigue.  HEENT: Eyes: No visual loss, blurred vision, double vision or yellow sclerae.No hearing loss, sneezing, congestion, runny nose or sore throat.  SKIN: No rash or itching.  CARDIOVASCULAR:  RESPIRATORY: No shortness of breath, cough or sputum.  GASTROINTESTINAL: No anorexia, nausea, vomiting or diarrhea. No abdominal pain or blood.  GENITOURINARY: No burning on urination, no polyuria NEUROLOGICAL: No headache, dizziness, syncope, paralysis, ataxia, numbness or tingling in the extremities. No change in bowel or bladder control.  MUSCULOSKELETAL: No muscle, back pain, joint pain or stiffness.  LYMPHATICS: No enlarged nodes. No history of splenectomy.  PSYCHIATRIC: No history of depression or anxiety.  ENDOCRINOLOGIC: No reports of sweating, cold or heat intolerance. No polyuria or polydipsia.  SABRA   Physical Examination There were no vitals filed for this visit. There were no vitals filed for this visit.  Gen: resting comfortably, no acute distress HEENT: no scleral icterus, pupils equal round and reactive, no palptable cervical adenopathy,  CV Resp: Clear to auscultation bilaterally GI: abdomen is  soft, non-tender, non-distended, normal bowel sounds, no hepatosplenomegaly MSK: extremities are warm, no edema.  Skin: warm, no rash Neuro:  no focal deficits Psych: appropriate affect   Diagnostic Studies 02/2012 Cath   1. Normal left ventricular function with an ejection fraction of at   least 55% with suggestion of minimal posterobasal inferior   hypocontractility.   2. Extensive coronary calcification involving the left main, left   anterior descending artery, left circumflex, and right coronary   arteries.   3. Probable ostial tapering of the left main to 60-70% with evidence   for AO pressure dampening by 10-15 mm concordant with ostial   stenosis.   4. 40% proximal circumflex with 70% circumflex marginal 1 stenosis.   5. Total occlusion of the right coronary artery at the ostium with   extensive collateralization to the posterior descending artery   vessel via the left coronary circulation.     03/2013 Echo   LVEF 55-60%, no WMAs, grade I diastolic dysfunction, mild MR     02/2013 Carotid US    Patent right CEA, <40% stenosis on left.     04/2015 Carotid US  Patent RICA/CEA, LICA 40-59%    Assessment and Plan   1. CAD   -denies symptoms, continue current meds       2. HTN   - at goal, continue current meds   3. Hyperlipidemia   - LDL above goal. With prior CABG, CEA, age, would aim for LDL <55 for high risk patient.  - d/c atorvastatin , start crestor  40mg  daily       Dorn PHEBE Ross, M.D., F.A.C.C.

## 2024-03-28 ENCOUNTER — Ambulatory Visit: Attending: Cardiology | Admitting: Cardiology

## 2024-03-28 ENCOUNTER — Encounter: Payer: Self-pay | Admitting: Cardiology

## 2024-03-28 VITALS — BP 112/64 | HR 102 | Ht 73.0 in | Wt 208.8 lb

## 2024-03-28 DIAGNOSIS — I6523 Occlusion and stenosis of bilateral carotid arteries: Secondary | ICD-10-CM | POA: Diagnosis not present

## 2024-03-28 DIAGNOSIS — E782 Mixed hyperlipidemia: Secondary | ICD-10-CM | POA: Diagnosis not present

## 2024-03-28 DIAGNOSIS — I2510 Atherosclerotic heart disease of native coronary artery without angina pectoris: Secondary | ICD-10-CM

## 2024-03-28 DIAGNOSIS — I10 Essential (primary) hypertension: Secondary | ICD-10-CM | POA: Diagnosis not present

## 2024-03-28 NOTE — Patient Instructions (Signed)
 Medication Instructions:  Continue all current medications.   Labwork: none  Testing/Procedures: none  Follow-Up: 6 months   Any Other Special Instructions Will Be Listed Below (If Applicable).   If you need a refill on your cardiac medications before your next appointment, please call your pharmacy.

## 2024-03-28 NOTE — Progress Notes (Signed)
 Clinical Summary Mr. Bontempo is a 83 y.o.male seen today for follow up of the following medical problems.    1. CAD   - prior 3 vessel CABG July 2013, LVEF around that time by LV gram was 55%   - has had chronic MSK chest pain since his CABG that is unchanged   -no chest pains, no SOB/DOE - compliant with meds - weed eats for up 30 minutes. Walks up flight of stairs multiples time a week without exertional    2. Carotid stenosis   - prior right CEA July 2013   - followed by vascular surgery, last visit 11/2022 11/2022 RICA 1-39%, LICA 1-39%     3. HTN    - he is compliant with meds       4. Hyperlipidemia   - 02/2024 TC 121 TG 92 HDL 45 LDL 58 - he is compliant with meds   5. OSA   -followed bypulmonary   6. Syncope - episode while waiting on a trump rally - limited oral intake that tday, warm humid day. Waiting about 1 hour outside to get in. - felt lightheaded all of sudden, no other symptoms - reports when EMS evlauated him was told his bp was low - seen at [facility] ER, we are requesting records. He reports extensive testing, told he was dehydrated.   - isolated episode, no recurrence     7. Preoperative evaluation - plans for possible back surgery under general anesthesia - tolerates greater than 4 METs regularly without symptoms   Wife recent hip fracture, completed surgery. Discharged to rehab, now home. Wife dealing with some fluid on the brain, extended stay at [facility]  Past Medical History:  Diagnosis Date   Arthritis    CAD (coronary artery disease)    CAD (coronary artery disease) 12/04/2011   Left Main Disease with 3- vessel CAD    CAD (coronary artery disease)    seeing Dr Alvan   CHF (congestive heart failure) (HCC)    Deviated septum    Dyslipidemia    GERD (gastroesophageal reflux disease)    Heart attack (HCC)    Hyperlipidemia    Hypertension    Hypothyroidism    MI (myocardial infarction) (HCC) 1997   Neuropathy     Obesity (BMI 30-39.9) 09/02/2018   PONV (postoperative nausea and vomiting)    has in past, no problems after most recent surgery   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   LIMA to LAD, SVG to OM2, SVG to PDA, EVH via right thigh   S/P carotid endarterectomy 02/29/2012   Right CEA for asymptomatic severe right ICA stenosis    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   Left Ear Rim (Cx3,5FU)   Shortness of breath    with exertion   Sleep apnea    CPAP, sleep study at [facility]   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   80-99% stenosis RICA, asymptomatic   Thyroid  nodule    Wears glasses    or contacts     Not on File   Current Outpatient Medications  Medication Sig Dispense Refill   amitriptyline (ELAVIL) 25 MG tablet Take 25 mg by mouth at bedtime.     amLODipine  (NORVASC ) 10 MG tablet Take 1 tablet (10 mg total) by mouth daily. 90 tablet 3   aspirin  EC 81 MG tablet Take 81 mg by mouth in the morning.     DULoxetine  (CYMBALTA ) 30 MG capsule Take 1 capsule (30  mg total) by mouth daily. 30 capsule 3   esomeprazole (NEXIUM) 20 MG capsule Take 20 mg by mouth daily before breakfast.     gabapentin  (NEURONTIN ) 300 MG capsule TAKE (1) CAPSULE BY MOUTH TWICE DAILY. 180 capsule 1   metoprolol  (LOPRESSOR ) 50 MG tablet Take 1 tablet (50 mg total) by mouth 2 (two) times daily. 180 tablet 1   Multiple Vitamins-Minerals (PRESERVISION AREDS 2 PO) Take 1 tablet by mouth 2 (two) times daily.      Omega-3 Fatty Acids (FISH OIL) 1000 MG CPDR Take 1,000-2,000 mg by mouth See admin instructions. Take 2 capsules (2000 mg) by mouth once daily in the morning & take 1 capsule (1000 mg) by mouth in the evening.     ramipril (ALTACE) 10 MG capsule Take 10 mg by mouth in the morning.     rosuvastatin  (CRESTOR ) 40 MG tablet TAKE ONE TABLET BY MOUTH EVERY DAY 90 tablet 0   SYNTHROID  112 MCG tablet Take 112 mcg by mouth daily before breakfast.      zolpidem  (AMBIEN ) 10 MG tablet Take 1 tablet (10 mg total) by mouth at  bedtime. 90 tablet 0   No current facility-administered medications for this visit.   Facility-Administered Medications Ordered in Other Visits  Medication Dose Route Frequency Provider Last Rate Last Admin   lactated ringers  infusion   Intravenous Continuous PRN Eliodoro Deward FALCON, CRNA   New Bag at 12/13/22 1142     Past Surgical History:  Procedure Laterality Date   ADENOIDECTOMY     CARDIAC CATHETERIZATION     02/22/12   CARDIOVASCULAR STRESS TEST  7/13   CAROTID ENDARTERECTOMY     COLONOSCOPY  15-20years and 12/04/11   Done at [facility]   COLONOSCOPY  12/11/2011   Procedure: COLONOSCOPY;  Surgeon: Claudis RAYMOND Rivet, MD;  Location: [facility];  Service: Endoscopy;  Laterality: N/A;  830   COLONOSCOPY WITH PROPOFOL  N/A 11/29/2021   Procedure: COLONOSCOPY WITH PROPOFOL ;  Surgeon: Eartha Angelia Sieving, MD;  Location[facility];  Service: Gastroenterology;  Laterality: N/A;  1105 ASA 2   CORONARY ARTERY BYPASS GRAFT  02/29/2012   Procedure: CORONARY ARTERY BYPASS GRAFTING (CABG);  Surgeon: Sudie VEAR Laine, MD;  Location: [facility] OR;  Service: Open Heart Surgery;  Laterality: N/A;  times three using Left Internal Mammary Artery and Right Greater Saphenous Vein Graft Harvested Endoscopically   CORONARY STENT PLACEMENT     ENDARTERECTOMY  02/29/2012   Procedure: ENDARTERECTOMY CAROTID;  Surgeon: Lonni GORMAN Blade, MD;  Location: [facility] OR;  Service: Vascular;  Laterality: Right;   FOOT SURGERY  06/30/11   artificial joint in big toe right foot    KNEE ARTHROSCOPY WITH MEDIAL MENISECTOMY Left 07/15/2019   Procedure: KNEE ARTHROSCOPY WITH MEDIAL MENISCECTOMY AND LATERAL MENISCECTOMY;  Surgeon: Margrette Taft BRAVO, MD;  Location: [facility] ORS;  Service: Orthopedics;  Laterality: Left;   LEFT HEART CATHETERIZATION WITH CORONARY ANGIOGRAM N/A 02/20/2012   Procedure: LEFT HEART CATHETERIZATION WITH CORONARY ANGIOGRAM;  Surgeon: Debby DELENA Sor, MD;  Location: [facility] CATH LAB;  Service: Cardiovascular;   Laterality: N/A;   NASAL SEPTUM SURGERY     Totol thyroidectomy  02/06/2011   TRANSESOPHAGEAL ECHOCARDIOGRAM     UPPER GASTROINTESTINAL ENDOSCOPY  35-40 years ago   [facility]     Not on File    Family History  Problem Relation Age of Onset   Hypertension Mother    Healthy Sister    Healthy Brother    Healthy  Son    Heart attack Father    Heart disease Father        Before age 66   Hyperlipidemia Father    Hypertension Father    Prostate cancer Father    Colon cancer Neg Hx      Social History Mr. Nagy reports that he quit smoking about 22 years ago. His smoking use included cigarettes. He started smoking about 37 years ago. He has a 7.5 pack-year smoking history. He has been exposed to tobacco smoke. He has never used smokeless tobacco. Mr. Cowgill reports that he does not currently use alcohol after a past usage of about 3.0 - 4.0 standard drinks of alcohol per week.   Physical Examination Today's Vitals   03/28/24 0926  BP: 112/64  Pulse: (!) 102  SpO2: 96%  Weight: 208 lb 12.8 oz (94.7 kg)  Height: 6' 1 (1.854 m)  PainSc: 4   PainLoc: Leg   Body mass index is 27.55 kg/m.  Gen: resting comfortably, no acute distress HEENT: no scleral icterus, pupils equal round and reactive, no palptable cervical adenopathy,  CV: RRR, no m/rg, no jvd Resp: Clear to auscultation bilaterally GI: abdomen is soft, non-tender, non-distended, normal bowel sounds, no hepatosplenomegaly MSK: extremities are warm, no edema.  Skin: warm, no rash Neuro:  no focal deficits Psych: appropriate affect   Diagnostic Studies 02/2012 Cath   1. Normal left ventricular function with an ejection fraction of at   least 55% with suggestion of minimal posterobasal inferior   hypocontractility.   2. Extensive coronary calcification involving the left main, left   anterior descending artery, left circumflex, and right coronary   arteries.   3. Probable ostial tapering of the left main  to 60-70% with evidence   for AO pressure dampening by 10-15 mm concordant with ostial   stenosis.   4. 40% proximal circumflex with 70% circumflex marginal 1 stenosis.   5. Total occlusion of the right coronary artery at the ostium with   extensive collateralization to the posterior descending artery   vessel via the left coronary circulation.     03/2013 Echo   LVEF 55-60%, no WMAs, grade I diastolic dysfunction, mild MR     02/2013 Carotid US    Patent right CEA, <40% stenosis on left.     04/2015 Carotid US  Patent RICA/CEA, LICA 40-59%    Assessment and Plan   1. CAD   -no recent symptoms, continue current meds - EKG SR, no acute ischemic changes   2. HTN   - bp is at goal, continue current meds   3. Hyperlipidemia   - LDL above goal. With prior CABG, CEA, age, would aim for LDL <55 for high risk patient.  -LDL essentially at goal, continue current therapy  4. Preoperative evaluation - considering back surgery under general anesthesia - tolerates greater than 4 METs regularly without symptoms - recommend proceeding with surgery. If neccesary can hold aspirin  as needed.      Dorn PHEBE Ross, M.D.

## 2024-04-04 NOTE — Telephone Encounter (Signed)
 Pt is requesting a callback regarding clearance being faxed back to [facility] since he's been seen. Please advise.

## 2024-04-04 NOTE — Telephone Encounter (Signed)
 Preop clearance routed to surgeons office

## 2024-04-09 ENCOUNTER — Other Ambulatory Visit: Payer: Self-pay | Admitting: Neurological Surgery

## 2024-04-28 ENCOUNTER — Other Ambulatory Visit: Payer: Self-pay | Admitting: Primary Care

## 2024-04-28 NOTE — Progress Notes (Signed)
 Surgical Instructions   Your procedure is scheduled on Friday, September 17th. Report to [facility] Main Entrance A at 11:50 A.M., then check in with the Admitting office. Any questions or running late day of surgery: call [phone]  Questions prior to your surgery date: call [phone], Monday-Friday, 8am-4pm. If you experience any cold or flu symptoms such as cough, fever, chills, shortness of breath, etc. between now and your scheduled surgery, please notify us  at the above number.     Remember:  Do not eat after midnight the night before your surgery  You may drink clear liquids until 10:50 the morning of your surgery.   Clear liquids allowed are: Water , Non-Citrus Juices (without pulp), Carbonated Beverages, Clear Tea (no milk, honey, etc.), Black Coffee Only (NO MILK, CREAM OR POWDERED CREAMER of any kind), and Gatorade.    Take these medicines the morning of surgery with A SIP OF WATER   amLODipine  (NORVASC )  DULoxetine  (CYMBALTA )  metoprolol  (LOPRESSOR )  rosuvastatin  (CRESTOR )  SYNTHROID   tamsulosin  (FLOMAX )    May take these medicines IF NEEDED: esomeprazole (NEXIUM)  gabapentin  (NEURONTIN )  HYDROcodone -acetaminophen  (NORCO/VICODIN)     Follow your surgeon's instructions on when to stop Asprin.  If no instructions were given by your surgeon then you will need to call the office to get those instructions.     One week prior to surgery, STOP taking any Aleve, Naproxen, Ibuprofen , Motrin , Advil , Goody's, BC's, all herbal medications, and non-prescription vitamins.  This includes your Omega-3 Fatty Acids (FISH OIL).                      Do NOT Smoke (Tobacco/Vaping) for 24 hours prior to your procedure.  If you use a CPAP at night, you may bring your mask/headgear for your overnight stay.   You will be asked to remove any contacts, glasses, piercing's, hearing aid's, dentures/partials prior to surgery. Please bring cases for these items if needed.    Patients  discharged the day of surgery will not be allowed to drive home, and someone needs to stay with them for 24 hours.  SURGICAL WAITING ROOM VISITATION Patients may have no more than 2 support people in the waiting area - these visitors may rotate.   Pre-op nurse will coordinate an appropriate time for 1 ADULT support person, who may not rotate, to accompany patient in pre-op.  Children under the age of 74 must have an adult with them who is not the patient and must remain in the main waiting area with an adult.  If the patient needs to stay at the hospital during part of their recovery, the visitor guidelines for inpatient rooms apply.  Please refer to the [facility] website for the visitor guidelines for any additional information.   If you received a COVID test during your pre-op visit  it is requested that you wear a mask when out in public, stay away from anyone that may not be feeling well and notify your surgeon if you develop symptoms. If you have been in contact with anyone that has tested positive in the last 10 days please notify you surgeon.      Pre-operative 5 CHG Bathing Instructions   You can play a key role in reducing the risk of infection after surgery. Your skin needs to be as free of germs as possible. You can reduce the number of germs on your skin by washing with CHG (chlorhexidine  gluconate) soap before surgery. CHG is an antiseptic soap that  kills germs and continues to kill germs even after washing.   DO NOT use if you have an allergy to chlorhexidine /CHG or antibacterial soaps. If your skin becomes reddened or irritated, stop using the CHG and notify one of our RNs at [phone].   Please shower with the CHG soap starting 4 days before surgery using the following schedule:     Please keep in mind the following:  DO NOT shave, including legs and underarms, starting the day of your first shower.   You may shave your face at any point before/day of surgery.   Place clean sheets on your bed the day you start using CHG soap. Use a clean washcloth (not used since being washed) for each shower. DO NOT sleep with pets once you start using the CHG.   CHG Shower Instructions:  Wash your face and private area with normal soap. If you choose to wash your hair, wash first with your normal shampoo.  After you use shampoo/soap, rinse your hair and body thoroughly to remove shampoo/soap residue.  Turn the water  OFF and apply about 3 tablespoons (45 ml) of CHG soap to a CLEAN washcloth.  Apply CHG soap ONLY FROM YOUR NECK DOWN TO YOUR TOES (washing for 3-5 minutes)  DO NOT use CHG soap on face, private areas, open wounds, or sores.  Pay special attention to the area where your surgery is being performed.  If you are having back surgery, having someone wash your back for you may be helpful. Wait 2 minutes after CHG soap is applied, then you may rinse off the CHG soap.  Pat dry with a clean towel  Put on clean clothes/pajamas   If you choose to wear lotion, please use ONLY the CHG-compatible lotions that are listed below.  Additional instructions for the day of surgery: DO NOT APPLY any lotions, deodorants, cologne, or perfumes.   Do not bring valuables to the hospital. [facility] is not responsible for any belongings/valuables. Do not wear nail polish, gel polish, artificial nails, or any other type of covering on natural nails (fingers and toes) Do not wear jewelry or makeup Put on clean/comfortable clothes.  Please brush your teeth.  Ask your nurse before applying any prescription medications to the skin.     CHG Compatible Lotions   Aveeno Moisturizing lotion  Cetaphil Moisturizing Cream  Cetaphil Moisturizing Lotion  Clairol Herbal Essence Moisturizing Lotion, Dry Skin  Clairol Herbal Essence Moisturizing Lotion, Extra Dry Skin  Clairol Herbal Essence Moisturizing Lotion, Normal Skin  Curel Age Defying Therapeutic Moisturizing Lotion with  Alpha Hydroxy  Curel Extreme Care Body Lotion  Curel Soothing Hands Moisturizing Hand Lotion  Curel Therapeutic Moisturizing Cream, Fragrance-Free  Curel Therapeutic Moisturizing Lotion, Fragrance-Free  Curel Therapeutic Moisturizing Lotion, Original Formula  Eucerin Daily Replenishing Lotion  Eucerin Dry Skin Therapy Plus Alpha Hydroxy Crme  Eucerin Dry Skin Therapy Plus Alpha Hydroxy Lotion  Eucerin Original Crme  Eucerin Original Lotion  Eucerin Plus Crme Eucerin Plus Lotion  Eucerin TriLipid Replenishing Lotion  Keri Anti-Bacterial Hand Lotion  Keri Deep Conditioning Original Lotion Dry Skin Formula Softly Scented  Keri Deep Conditioning Original Lotion, Fragrance Free Sensitive Skin Formula  Keri Lotion Fast Absorbing Fragrance Free Sensitive Skin Formula  Keri Lotion Fast Absorbing Softly Scented Dry Skin Formula  Keri Original Lotion  Keri Skin Renewal Lotion Keri Silky Smooth Lotion  Keri Silky Smooth Sensitive Skin Lotion  Nivea Body Creamy Conditioning Oil  Nivea Body Extra Enriched Education administrator  Original Lotion  Nivea Body Sheer Moisturizing Lotion Nivea Crme  Nivea Skin Firming Lotion  NutraDerm 30 Skin Lotion  NutraDerm Skin Lotion  NutraDerm Therapeutic Skin Cream  NutraDerm Therapeutic Skin Lotion  ProShield Protective Hand Cream  Provon moisturizing lotion  Please read over the following fact sheets that you were given.

## 2024-04-28 NOTE — Telephone Encounter (Signed)
 Patient was a walk in in [place] check on this refill

## 2024-04-29 ENCOUNTER — Encounter (HOSPITAL_COMMUNITY): Payer: Self-pay

## 2024-04-29 ENCOUNTER — Encounter (HOSPITAL_COMMUNITY)
Admission: RE | Admit: 2024-04-29 | Discharge: 2024-04-29 | Disposition: A | Discharge status: Home / Self Care | Source: Ambulatory Visit | Attending: Neurological Surgery | Admitting: Neurological Surgery

## 2024-04-29 ENCOUNTER — Other Ambulatory Visit: Payer: Self-pay

## 2024-04-29 VITALS — BP 138/64 | HR 78 | Temp 97.5°F | Resp 18 | Ht 73.0 in | Wt 210.0 lb

## 2024-04-29 DIAGNOSIS — Z01812 Encounter for preprocedural laboratory examination: Secondary | ICD-10-CM | POA: Diagnosis not present

## 2024-04-29 DIAGNOSIS — Z955 Presence of coronary angioplasty implant and graft: Secondary | ICD-10-CM | POA: Insufficient documentation

## 2024-04-29 DIAGNOSIS — M48062 Spinal stenosis, lumbar region with neurogenic claudication: Secondary | ICD-10-CM | POA: Insufficient documentation

## 2024-04-29 DIAGNOSIS — Z01818 Encounter for other preprocedural examination: Secondary | ICD-10-CM

## 2024-04-29 DIAGNOSIS — Z85828 Personal history of other malignant neoplasm of skin: Secondary | ICD-10-CM | POA: Insufficient documentation

## 2024-04-29 DIAGNOSIS — Z951 Presence of aortocoronary bypass graft: Secondary | ICD-10-CM | POA: Diagnosis not present

## 2024-04-29 DIAGNOSIS — I252 Old myocardial infarction: Secondary | ICD-10-CM | POA: Insufficient documentation

## 2024-04-29 DIAGNOSIS — E785 Hyperlipidemia, unspecified: Secondary | ICD-10-CM | POA: Diagnosis not present

## 2024-04-29 DIAGNOSIS — G4733 Obstructive sleep apnea (adult) (pediatric): Secondary | ICD-10-CM | POA: Insufficient documentation

## 2024-04-29 DIAGNOSIS — I10 Essential (primary) hypertension: Secondary | ICD-10-CM | POA: Diagnosis not present

## 2024-04-29 DIAGNOSIS — Z87891 Personal history of nicotine dependence: Secondary | ICD-10-CM | POA: Insufficient documentation

## 2024-04-29 DIAGNOSIS — I2510 Atherosclerotic heart disease of native coronary artery without angina pectoris: Secondary | ICD-10-CM | POA: Insufficient documentation

## 2024-04-29 LAB — BASIC METABOLIC PANEL WITH GFR
Anion gap: 10 (ref 5–15)
BUN: 15 mg/dL (ref 8–23)
CO2: 25 mmol/L (ref 22–32)
Calcium: 9.7 mg/dL (ref 8.9–10.3)
Chloride: 99 mmol/L (ref 98–111)
Creatinine, Ser: 1.32 mg/dL — ABNORMAL HIGH (ref 0.61–1.24)
GFR, Estimated: 55 mL/min — ABNORMAL LOW (ref >60)
Glucose, Bld: 124 mg/dL — ABNORMAL HIGH (ref 70–99)
Potassium: 4.6 mmol/L (ref 3.5–5.1)
Sodium: 134 mmol/L — ABNORMAL LOW (ref 135–145)

## 2024-04-29 LAB — SURGICAL PCR SCREEN
MRSA, PCR: NEGATIVE
Staphylococcus aureus: NEGATIVE

## 2024-04-29 LAB — CBC
HCT: 41.3 % (ref 39.0–52.0)
Hemoglobin: 14.1 g/dL (ref 13.0–17.0)
MCH: 30.9 pg (ref 26.0–34.0)
MCHC: 34.1 g/dL (ref 30.0–36.0)
MCV: 90.6 fL (ref 80.0–100.0)
Platelets: 190 K/uL (ref 150–400)
RBC: 4.56 MIL/uL (ref 4.22–5.81)
RDW: 13 % (ref 11.5–15.5)
WBC: 7.1 K/uL (ref 4.0–10.5)
nRBC: 0 % (ref 0.0–0.2)

## 2024-04-29 NOTE — Progress Notes (Signed)
 PCP - Dr. Norleen Darlyn Hurst Cardiologist - Dorn Ross LOV 03-28-24 for clearance  PPM/ICD - Denies Device Orders - n/a Rep Notified - n/a  Chest x-ray - N/A EKG - 03-28-24 Stress Test - 02-16-12 ECHO - 03-26-13 Cardiac Cath - 02-20-12  Sleep Study - Yes, 01-12-13 CPAP - wears nightly  NON-diabetic  Last dose of GLP1 agonist-  Denies GLP1 instructions: n/a  Blood Thinner Instructions:Denies Aspirin  Instructions: Per patient per physician last dose was on Sat Sept 13th.  ERAS Protcol - Clears until 1050 PRE-SURGERY Ensure or G2- none  COVID TEST- n/a   Anesthesia review: Yes, HTN, MI, CAD, Sleep apnea, CABG X3, CHF, CAD  Patient denies shortness of breath, fever, cough and chest pain at PAT appointment. Patient denies any respiratory issues at this time.    All instructions explained to the patient, with a verbal understanding of the material. Patient agrees to go over the instructions while at home for a better understanding. Patient also instructed to self quarantine after being tested for COVID-19. The opportunity to ask questions was provided.

## 2024-04-29 NOTE — Telephone Encounter (Signed)
**Note De-identified  [place] Obfuscation** Please advise 

## 2024-04-29 NOTE — Telephone Encounter (Signed)
 Pt requesting refill of specialty medication. Please advise, thank you!

## 2024-04-30 ENCOUNTER — Telehealth: Payer: Self-pay | Admitting: Primary Care

## 2024-04-30 NOTE — Anesthesia Preprocedure Evaluation (Signed)
 Anesthesia Evaluation  Patient identified by MRN, date of birth, ID band Patient awake    Reviewed: Allergy & Precautions, NPO status , Patient's Chart, lab work & pertinent test results  History of Anesthesia Complications (+) PONV and history of anesthetic complications  Airway Mallampati: II  TM Distance: >3 FB Neck ROM: Full    Dental  (+) Dental Advisory Given   Pulmonary sleep apnea and Continuous Positive Airway Pressure Ventilation , former smoker   breath sounds clear to auscultation       Cardiovascular 3 - Mets hypertension, Pt. on medications and Pt. on home beta blockers + CAD, + Past MI, + Cardiac Stents and + CABG   Rhythm:Regular Rate:Normal     Neuro/Psych negative neurological ROS     GI/Hepatic Neg liver ROS,GERD  ,,  Endo/Other  Hypothyroidism    Renal/GU Renal InsufficiencyRenal disease     Musculoskeletal  (+) Arthritis ,    Abdominal   Peds  Hematology negative hematology ROS (+)   Anesthesia Other Findings   Reproductive/Obstetrics                              Anesthesia Physical Anesthesia Plan  ASA: 3  Anesthesia Plan: General   Post-op Pain Management: Tylenol  PO (pre-op)*   Induction: Intravenous  PONV Risk Score and Plan: 3 and Dexamethasone , Ondansetron  and Treatment may vary due to age or medical condition  Airway Management Planned: Oral ETT  Additional Equipment:   Intra-op Plan:   Post-operative Plan: Extubation in OR  Informed Consent: I have reviewed the patients History and Physical, chart, labs and discussed the procedure including the risks, benefits and alternatives for the proposed anesthesia with the patient or authorized representative who has indicated his/her understanding and acceptance.     Dental advisory given  Plan Discussed with: CRNA  Anesthesia Plan Comments: (PAT note written 04/30/2024 by Edy Mcbane,  PA-C.  )         Anesthesia Quick Evaluation

## 2024-04-30 NOTE — Telephone Encounter (Signed)
Sent in [place], thanks

## 2024-04-30 NOTE — Telephone Encounter (Signed)
 LOV 02/04/24 Next OV NOT SCHEDULED Last refill 02/04/24, #90, 0 refills  Please review, thanks!

## 2024-04-30 NOTE — Telephone Encounter (Signed)
 Patient was a walk in 04/28/24 in on a refill for Ambien .  Patient was informed we had received the request from [facility], [place] and it would be address as quickly as possible.  Patient stated he was completely out of medication.  Patient cam e back in Tuesday 04/29/24 stating his

## 2024-04-30 NOTE — Telephone Encounter (Signed)
 See Med Refill encounter dated 04/28/24.

## 2024-04-30 NOTE — Telephone Encounter (Signed)
 Patient was a walk in today check on the the refill for Ambien ---I reached out to [facility], CMA and she states this has been sent to [name], NP for approval---Ambien  is a controlled substance and cannot be refilled by a CMA.  She will keep and eye on this and if it is not refilled by this afternoon, she will reach out again to the provider

## 2024-04-30 NOTE — Telephone Encounter (Signed)
 Received response from [facility], CMA---she spoke with [name], NP regarding this refill request and Almarie will be calling the patient---I did inform [name] [name] he could be expecting a call from us  this afternoon.  He voiced his understanding

## 2024-04-30 NOTE — Progress Notes (Signed)
 Anesthesia Chart Review:  Case: [id number] Date/Time: 05/02/24 1334   Procedure: LUMBAR LAMINECTOMY/DECOMPRESSION MICRODISCECTOMY 3 LEVELS (Bilateral: Back) - Laminectomy and Foraminotomy - L3-L4 - L4-L5 - L5-S1 - bilateral   Anesthesia type: General   Diagnosis: Spinal stenosis of lumbar region with neurogenic claudication [M48.062]   Pre-op diagnosis: Spinal stenosis of lumbar region with neurogenic claudication   [facility] ROOM 21 / [facility] OR   Surgeons: Joshua Alm Hamilton, MD       DISCUSSION: Patient is an 83 year old male scheduled for the above procedure.  History includes former smoker (quit 09/04/2001), postoperative N/V, HTN, HLD, CAD (MI, s/p PCI 1997; CABG: LIMA-LAD, SVG-OM2, SVG-PDA 02/28/2022), carotid artery stenosis (right CEA 02/28/2022), thyroidectomy (02/06/2011), post-surgical hypothyroidism, exertional dyspnea, OSA, skin cancer (SCC), GERD, neuropathy, syncope (04/04/2023 while waiting in line to at [facility], negative CT, felt to be dehydrated) .   Cardiology visit was on 03/28/2024 with Dr. Dorn Ross. He notes patient with 3V CABG in 2012, LVEF 55%. Known chronic musculoskeletal chest pain since his CABG which has remained unchanged. At last visit reported able to weed eat for up to 30 minutes and walk up a flight of stairs multiple times a week without exertional symptoms. Right CEA done at the time of his CABG. Last carotid US  in 11/2022 showed 1-39% Bilateral ICA stenosis. No recurrent syncope since 03/2023. In regards to upcoming surgery, he wrote, Preoperative evaluation - considering back surgery under general anesthesia - tolerates greater than 4 METs regularly without symptoms - recommend proceeding with surgery. If neccesary can hold aspirin  as needed. Six month follow-up planned.    Last pulmonology visit was on 02/04/2024 with [name], NP for OSA and insomnia follow-up. She wrote, Chronic obstructive sleep apnea, initially diagnosed in January 2014  with mild severity, characterized by an apnea index of 8.3 events per hour and a nadir oxygen saturation of 88%.  Currently managed with CPAP therapy. He is 100% compliant with CPAP use over the last 30 days > 4 hour. He uses consistently for 8-9 hours per night. The CPAP pressure is set at 10 cm H2O, resulting in an improved apnea score of 0.7. He reports significant benefit from CPAP use and cannot sleep without it. He needed a new DME company due to insurance. Using zolpidem  and gabapentin  to help with insomnia. One year follow-up planned.   He reported last ASA 04/26/2024.  Anesthesia team to evaluate on the day of surgery.   VS: BP 138/64   Pulse 78   Temp (!) 36.4 C   Resp 18   Ht 6' 1 (1.854 m)   Wt 95.3 kg   SpO2 98%   BMI 27.71 kg/m   PROVIDERS: Shona Norleen PEDLAR, MD is  PCP  Ross Dorn, MD is cardiologist Ferrari Almarie, NP is pulmonology provider   LABS: Labs reviewed: Acceptable for surgery. (all labs ordered are listed, but only abnormal results are displayed)  Labs Reviewed  BASIC METABOLIC PANEL WITH GFR - Abnormal; Notable for the following components:      Result Value   Sodium 134 (*)    Glucose, Bld 124 (*)    Creatinine, Ser 1.32 (*)    GFR, Estimated 55 (*)    All other components within normal limits  SURGICAL PCR SCREEN  CBC   On 03/05/2024 [facility]): TSH 1.160, FT4 1.58, Cr 1.53 (previously 1.6 on 09/06/2023), BUN 17, eGFR 46, Ca 10.0, , AST 27, ALT 29, total bili 0.4, alk phos 115,  A1c 6.4%   IMAGES: MRI L-spine 01/09/2024: IMPRESSION: 1. Diffuse lumbar spine spondylosis as described above. 2. No acute osseous injury of the lumbar spine.    EKG: 03/28/2024: Sinus tachycardia at 102 bpm with Premature atrial complexes Nonspecific ST abnormality When compared with ECG of 08-Jul-2019 08:44, Premature atrial complexes are now Present Vent. rate has increased BY 40 BPM Confirmed by Alvan Carrier [phone]) on 03/28/2024 9:47:03  AM  CV: US  Carotid 11/23/2022: Summary:  - Right Carotid: Velocities in the right ICA are consistent with a 1-39% stenosis.  - Left Carotid: Velocities in the left ICA are consistent with a 1-39% stenosis.  - Vertebrals: Bilateral vertebral arteries demonstrate antegrade flow.   Echo 03/26/2013: Study Conclusions  - Left ventricle: The cavity size was normal. Wall thickness    was increased increased in a pattern of mild to moderate    LVH. There was concentric hypertrophy. Systolic function    was normal. The estimated ejection fraction was in the    range of 55% to 60%. Wall motion was normal; there were no    regional wall motion abnormalities. Doppler parameters are    consistent with abnormal left ventricular relaxation    (grade 1 diastolic dysfunction).  - Mitral valve: Mild regurgitation.  - Left atrium: The atrium was mildly dilated.  - Atrial septum: No defect or patent foramen ovale was    identified.   Last cardiac cath noted was pre-CABG in 2013.  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   LIMA to LAD, SVG to OM2, SVG to PDA, EVH via right thigh   S/P carotid endarterectomy 02/29/2012   Right CEA for asymptomatic severe right ICA stenosis    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   Left Ear Rim (Cx3,5FU)   Shortness of breath    with exertion   Sleep apnea    CPAP, sleep study at [facility]   Stenosis of right  carotid artery 02/23/2012   80-99% stenosis RICA, asymptomatic   Thyroid  nodule    Wears glasses    or contacts    Past Surgical History:  Procedure Laterality Date   ADENOIDECTOMY     CARDIAC CATHETERIZATION     02/22/12   CARDIOVASCULAR STRESS TEST  7/13   CAROTID ENDARTERECTOMY     COLONOSCOPY  15-20years and 12/04/11   Done at [facility]   COLONOSCOPY  12/11/2011   Procedure: COLONOSCOPY;  Surgeon: Claudis RAYMOND Rivet, MD;  Location: [facility] ENDO SUITE;  Service: Endoscopy;  Laterality: N/A;  830   COLONOSCOPY WITH PROPOFOL  N/A 11/29/2021   Procedure: COLONOSCOPY WITH PROPOFOL ;  Surgeon: Eartha Angelia Sieving, MD;  Location: [facility] ENDO SUITE;  Service: Gastroenterology;  Laterality: N/A;  1105 ASA 2   CORONARY ARTERY BYPASS GRAFT  02/29/2012   Procedure: CORONARY ARTERY BYPASS GRAFTING (CABG);  Surgeon: Sudie VEAR Laine, MD;  Location: [facility] OR;  Service: Open Heart Surgery;  Laterality: N/A;  times three using Left Internal Mammary Artery and Right Greater Saphenous Vein Graft Harvested Endoscopically   CORONARY STENT PLACEMENT     ENDARTERECTOMY  02/29/2012   Procedure: ENDARTERECTOMY CAROTID;  Surgeon: Lonni GORMAN Blade, MD;  Location: [facility] OR;  Service: Vascular;  Laterality: Right;   FOOT SURGERY  06/30/11   artificial joint in big toe right foot    KNEE ARTHROSCOPY WITH MEDIAL MENISECTOMY Left 07/15/2019   Procedure: KNEE ARTHROSCOPY WITH MEDIAL MENISCECTOMY AND LATERAL MENISCECTOMY;  Surgeon: Margrette Taft BRAVO, MD;  Location: [facility] ORS;  Service: Orthopedics;  Laterality: Left;   LEFT HEART CATHETERIZATION WITH CORONARY ANGIOGRAM N/A 02/20/2012   Procedure: LEFT HEART CATHETERIZATION WITH CORONARY ANGIOGRAM;  Surgeon: Debby DELENA Sor, MD;  Location: [facility] CATH LAB;  Service: Cardiovascular;  Laterality: N/A;   NASAL SEPTUM SURGERY     Totol thyroidectomy  02/06/2011   TRANSESOPHAGEAL ECHOCARDIOGRAM     UPPER GASTROINTESTINAL ENDOSCOPY  35-40 years ago   [facility]    MEDICATIONS:   amitriptyline  (ELAVIL ) 25 MG tablet   amLODipine  (NORVASC ) 10 MG tablet   aspirin  EC 81 MG tablet   DULoxetine  (CYMBALTA ) 30 MG capsule   esomeprazole (NEXIUM) 20 MG capsule   folic acid  (FOLVITE ) 1 MG tablet   gabapentin  (NEURONTIN ) 300 MG capsule   HYDROcodone -acetaminophen  (NORCO/VICODIN) 5-325 MG tablet   metoprolol  (LOPRESSOR ) 50 MG tablet   Multiple Vitamins-Minerals (PRESERVISION AREDS 2 PO)   NON FORMULARY   Omega-3 Fatty Acids (FISH OIL) 1000 MG CPDR   ramipril  (ALTACE ) 10 MG capsule   rosuvastatin  (CRESTOR ) 40 MG tablet   SYNTHROID  112 MCG tablet   tamsulosin  (FLOMAX ) 0.4 MG CAPS capsule   zolpidem  (AMBIEN ) 10 MG tablet   No current facility-administered medications for this encounter.    lactated ringers  infusion    Khila Papp, PA-C Surgical Short Stay/Anesthesiology [facility] Phone [phone] [facility] Phone [phone] 04/30/2024 2:49 PM

## 2024-05-02 ENCOUNTER — Ambulatory Visit (HOSPITAL_COMMUNITY)

## 2024-05-02 ENCOUNTER — Observation Stay (HOSPITAL_COMMUNITY)
Admission: RE | Admit: 2024-05-02 | Discharge: 2024-05-03 | Disposition: A | Discharge status: Home / Self Care | Attending: Neurological Surgery | Admitting: Neurological Surgery

## 2024-05-02 ENCOUNTER — Ambulatory Visit (HOSPITAL_COMMUNITY): Admitting: Anesthesiology

## 2024-05-02 ENCOUNTER — Encounter (HOSPITAL_COMMUNITY): Payer: Self-pay | Admitting: Vascular Surgery

## 2024-05-02 ENCOUNTER — Encounter (HOSPITAL_COMMUNITY): Payer: Self-pay | Admitting: Neurological Surgery

## 2024-05-02 ENCOUNTER — Other Ambulatory Visit: Payer: Self-pay

## 2024-05-02 ENCOUNTER — Encounter (HOSPITAL_COMMUNITY)
Admission: RE | Disposition: A | Discharge status: Home / Self Care | Payer: Self-pay | Source: Home / Self Care | Attending: Neurological Surgery

## 2024-05-02 DIAGNOSIS — Z0389 Encounter for observation for other suspected diseases and conditions ruled out: Secondary | ICD-10-CM | POA: Diagnosis not present

## 2024-05-02 DIAGNOSIS — M48062 Spinal stenosis, lumbar region with neurogenic claudication: Principal | ICD-10-CM | POA: Insufficient documentation

## 2024-05-02 DIAGNOSIS — I110 Hypertensive heart disease with heart failure: Secondary | ICD-10-CM | POA: Diagnosis not present

## 2024-05-02 DIAGNOSIS — I252 Old myocardial infarction: Secondary | ICD-10-CM | POA: Diagnosis not present

## 2024-05-02 DIAGNOSIS — Z85828 Personal history of other malignant neoplasm of skin: Secondary | ICD-10-CM | POA: Insufficient documentation

## 2024-05-02 DIAGNOSIS — I509 Heart failure, unspecified: Secondary | ICD-10-CM | POA: Insufficient documentation

## 2024-05-02 DIAGNOSIS — I2510 Atherosclerotic heart disease of native coronary artery without angina pectoris: Secondary | ICD-10-CM | POA: Insufficient documentation

## 2024-05-02 DIAGNOSIS — Z951 Presence of aortocoronary bypass graft: Secondary | ICD-10-CM | POA: Insufficient documentation

## 2024-05-02 DIAGNOSIS — I10 Essential (primary) hypertension: Secondary | ICD-10-CM

## 2024-05-02 DIAGNOSIS — Z87891 Personal history of nicotine dependence: Secondary | ICD-10-CM

## 2024-05-02 DIAGNOSIS — Z7982 Long term (current) use of aspirin: Secondary | ICD-10-CM | POA: Insufficient documentation

## 2024-05-02 DIAGNOSIS — E039 Hypothyroidism, unspecified: Secondary | ICD-10-CM | POA: Insufficient documentation

## 2024-05-02 DIAGNOSIS — Z79899 Other long term (current) drug therapy: Secondary | ICD-10-CM | POA: Diagnosis not present

## 2024-05-02 DIAGNOSIS — Z9889 Other specified postprocedural states: Principal | ICD-10-CM

## 2024-05-02 DIAGNOSIS — Z6827 Body mass index (BMI) 27.0-27.9, adult: Secondary | ICD-10-CM | POA: Insufficient documentation

## 2024-05-02 HISTORY — PX: LUMBAR LAMINECTOMY/DECOMPRESSION MICRODISCECTOMY: SHX5026

## 2024-05-02 SURGERY — LUMBAR LAMINECTOMY/DECOMPRESSION MICRODISCECTOMY 3 LEVELS
Anesthesia: General | Site: Back | Laterality: Bilateral

## 2024-05-02 MED ORDER — THROMBIN 5000 UNITS EX SOLR
OROMUCOSAL | Status: DC | PRN
  Administered 2024-05-02: 5 mL via TOPICAL

## 2024-05-02 MED ORDER — THROMBIN 20000 UNITS EX SOLR
CUTANEOUS | Status: DC | PRN
  Administered 2024-05-02: 20 mL via TOPICAL

## 2024-05-02 MED ORDER — ACETAMINOPHEN 500 MG PO TABS
1000.0000 mg | ORAL_TABLET | ORAL | Status: AC
Start: 2024-05-02 — End: 2024-05-02
  Administered 2024-05-02: 1000 mg via ORAL
  Filled 2024-05-02: qty 2

## 2024-05-02 MED ORDER — DEXAMETHASONE SODIUM PHOSPHATE 4 MG/ML IJ SOLN: 4.0000 mg | Freq: Four times a day (QID) | INTRAMUSCULAR | Status: DC

## 2024-05-02 MED ORDER — BUPIVACAINE HCL (PF) 0.25 % IJ SOLN
INTRAMUSCULAR | Status: DC | PRN
  Administered 2024-05-02: 10 mL

## 2024-05-02 MED ORDER — DEXAMETHASONE SODIUM PHOSPHATE 10 MG/ML IJ SOLN
INTRAMUSCULAR | Status: DC | PRN
  Administered 2024-05-02: 10 mg via INTRAVENOUS

## 2024-05-02 MED ORDER — FENTANYL CITRATE (PF) 250 MCG/5ML IJ SOLN
INTRAMUSCULAR | Status: DC | PRN
  Administered 2024-05-02: 50 ug via INTRAVENOUS
  Administered 2024-05-02: 100 ug via INTRAVENOUS

## 2024-05-02 MED ORDER — LACTATED RINGERS IV SOLN: INTRAVENOUS | Status: DC

## 2024-05-02 MED ORDER — ONDANSETRON HCL 4 MG/2ML IJ SOLN: 4.0000 mg | Freq: Four times a day (QID) | INTRAMUSCULAR | Status: DC | PRN

## 2024-05-02 MED ORDER — DEXMEDETOMIDINE HCL IN NACL 80 MCG/20ML IV SOLN
INTRAVENOUS | Status: AC
  Filled 2024-05-02: qty 20

## 2024-05-02 MED ORDER — POTASSIUM CHLORIDE IN NACL 20-0.9 MEQ/L-% IV SOLN
INTRAVENOUS | Status: DC
  Filled 2024-05-02: qty 1000

## 2024-05-02 MED ORDER — PHENOL 1.4 % MT LIQD: 1.0000 | OROMUCOSAL | Status: DC | PRN

## 2024-05-02 MED ORDER — AMISULPRIDE (ANTIEMETIC) 5 MG/2ML IV SOLN: 10.0000 mg | Freq: Once | INTRAVENOUS | Status: DC | PRN

## 2024-05-02 MED ORDER — EPHEDRINE SULFATE-NACL 50-0.9 MG/10ML-% IV SOSY
PREFILLED_SYRINGE | INTRAVENOUS | Status: DC | PRN
  Administered 2024-05-02 (×2): 5 mg via INTRAVENOUS

## 2024-05-02 MED ORDER — FENTANYL CITRATE (PF) 250 MCG/5ML IJ SOLN
INTRAMUSCULAR | Status: AC
  Filled 2024-05-02: qty 5

## 2024-05-02 MED ORDER — THROMBIN 5000 UNITS EX KIT
PACK | CUTANEOUS | Status: AC
  Filled 2024-05-02: qty 1

## 2024-05-02 MED ORDER — MENTHOL 3 MG MT LOZG: 1.0000 | LOZENGE | OROMUCOSAL | Status: DC | PRN

## 2024-05-02 MED ORDER — LEVOTHYROXINE SODIUM 112 MCG PO TABS
112.0000 ug | ORAL_TABLET | Freq: Every day | ORAL | Status: DC
  Administered 2024-05-03: 112 ug via ORAL
  Filled 2024-05-02: qty 1

## 2024-05-02 MED ORDER — OXYCODONE HCL 5 MG/5ML PO SOLN: 5.0000 mg | Freq: Once | ORAL | Status: DC | PRN

## 2024-05-02 MED ORDER — ZOLPIDEM TARTRATE 5 MG PO TABS
10.0000 mg | ORAL_TABLET | Freq: Every evening | ORAL | Status: DC | PRN
  Administered 2024-05-02: 10 mg via ORAL
  Filled 2024-05-02: qty 2

## 2024-05-02 MED ORDER — HYDROCODONE-ACETAMINOPHEN 5-325 MG PO TABS
1.0000 | ORAL_TABLET | Freq: Four times a day (QID) | ORAL | Status: DC | PRN
Start: 2024-05-02 — End: 2024-05-03
  Administered 2024-05-02 – 2024-05-03 (×2): 1 via ORAL
  Filled 2024-05-02 (×2): qty 1

## 2024-05-02 MED ORDER — GLYCOPYRROLATE PF 0.2 MG/ML IJ SOSY
PREFILLED_SYRINGE | INTRAMUSCULAR | Status: DC | PRN
  Administered 2024-05-02: .1 mg via INTRAVENOUS

## 2024-05-02 MED ORDER — DEXAMETHASONE 4 MG PO TABS
4.0000 mg | ORAL_TABLET | Freq: Four times a day (QID) | ORAL | Status: DC
  Administered 2024-05-02 – 2024-05-03 (×3): 4 mg via ORAL
  Filled 2024-05-02 (×3): qty 1

## 2024-05-02 MED ORDER — DULOXETINE HCL 30 MG PO CPEP
30.0000 mg | ORAL_CAPSULE | Freq: Every day | ORAL | Status: DC
  Administered 2024-05-02: 30 mg via ORAL
  Filled 2024-05-02: qty 1

## 2024-05-02 MED ORDER — ROCURONIUM BROMIDE 10 MG/ML (PF) SYRINGE
PREFILLED_SYRINGE | INTRAVENOUS | Status: AC
  Filled 2024-05-02: qty 10

## 2024-05-02 MED ORDER — SURGIRINSE WOUND IRRIGATION SYSTEM - OPTIME
TOPICAL | Status: DC | PRN
  Administered 2024-05-02: 450 mL via TOPICAL

## 2024-05-02 MED ORDER — CEFAZOLIN SODIUM-DEXTROSE 2-4 GM/100ML-% IV SOLN
2.0000 g | INTRAVENOUS | Status: AC
Start: 2024-05-02 — End: 2024-05-02
  Administered 2024-05-02: 2 g via INTRAVENOUS
  Filled 2024-05-02: qty 100

## 2024-05-02 MED ORDER — METOPROLOL TARTRATE 50 MG PO TABS
50.0000 mg | ORAL_TABLET | Freq: Two times a day (BID) | ORAL | Status: DC
  Administered 2024-05-02: 50 mg via ORAL
  Filled 2024-05-02: qty 1

## 2024-05-02 MED ORDER — AMITRIPTYLINE HCL 25 MG PO TABS
25.0000 mg | ORAL_TABLET | Freq: Every day | ORAL | Status: DC
Start: 2024-05-02 — End: 2024-05-03
  Administered 2024-05-02: 25 mg via ORAL
  Filled 2024-05-02: qty 1

## 2024-05-02 MED ORDER — 0.9 % SODIUM CHLORIDE (POUR BTL) OPTIME
TOPICAL | Status: DC | PRN
  Administered 2024-05-02: 1000 mL

## 2024-05-02 MED ORDER — CHLORHEXIDINE GLUCONATE 0.12 % MT SOLN
15.0000 mL | Freq: Once | OROMUCOSAL | Status: AC
  Administered 2024-05-02: 15 mL via OROMUCOSAL
  Filled 2024-05-02: qty 15

## 2024-05-02 MED ORDER — SODIUM CHLORIDE 0.9% FLUSH
3.0000 mL | Freq: Two times a day (BID) | INTRAVENOUS | Status: DC
  Administered 2024-05-02 (×2): 3 mL via INTRAVENOUS

## 2024-05-02 MED ORDER — METHOCARBAMOL 500 MG PO TABS
500.0000 mg | ORAL_TABLET | Freq: Four times a day (QID) | ORAL | Status: DC | PRN
  Administered 2024-05-02 – 2024-05-03 (×2): 500 mg via ORAL
  Filled 2024-05-02 (×2): qty 1

## 2024-05-02 MED ORDER — MIDAZOLAM HCL 2 MG/2ML IJ SOLN
INTRAMUSCULAR | Status: AC
  Filled 2024-05-02: qty 2

## 2024-05-02 MED ORDER — CHLORHEXIDINE GLUCONATE CLOTH 2 % EX PADS: 6.0000 | MEDICATED_PAD | Freq: Once | CUTANEOUS | Status: DC

## 2024-05-02 MED ORDER — LIDOCAINE 2% (20 MG/ML) 5 ML SYRINGE
INTRAMUSCULAR | Status: AC
  Filled 2024-05-02: qty 5

## 2024-05-02 MED ORDER — ONDANSETRON HCL 4 MG PO TABS: 4.0000 mg | ORAL_TABLET | Freq: Four times a day (QID) | ORAL | Status: DC | PRN

## 2024-05-02 MED ORDER — BUPIVACAINE HCL (PF) 0.25 % IJ SOLN
INTRAMUSCULAR | Status: AC
  Filled 2024-05-02: qty 30

## 2024-05-02 MED ORDER — AMLODIPINE BESYLATE 10 MG PO TABS
10.0000 mg | ORAL_TABLET | Freq: Every day | ORAL | Status: DC
  Administered 2024-05-02: 10 mg via ORAL
  Filled 2024-05-02: qty 1

## 2024-05-02 MED ORDER — METOPROLOL TARTRATE 50 MG PO TABS
50.0000 mg | ORAL_TABLET | Freq: Once | ORAL | Status: AC
  Administered 2024-05-02: 50 mg via ORAL
  Filled 2024-05-02: qty 1

## 2024-05-02 MED ORDER — DEXAMETHASONE SODIUM PHOSPHATE 10 MG/ML IJ SOLN
INTRAMUSCULAR | Status: AC
  Filled 2024-05-02: qty 1

## 2024-05-02 MED ORDER — MORPHINE SULFATE (PF) 2 MG/ML IV SOLN: 2.0000 mg | INTRAVENOUS | Status: DC | PRN

## 2024-05-02 MED ORDER — SENNA 8.6 MG PO TABS
1.0000 | ORAL_TABLET | Freq: Two times a day (BID) | ORAL | Status: DC
  Administered 2024-05-02: 8.6 mg via ORAL
  Filled 2024-05-02: qty 1

## 2024-05-02 MED ORDER — RAMIPRIL 5 MG PO CAPS
10.0000 mg | ORAL_CAPSULE | Freq: Every morning | ORAL | Status: DC
  Administered 2024-05-03: 10 mg via ORAL
  Filled 2024-05-02: qty 2

## 2024-05-02 MED ORDER — FOLIC ACID 1 MG PO TABS
1.0000 mg | ORAL_TABLET | Freq: Every day | ORAL | Status: DC
  Administered 2024-05-02: 1 mg via ORAL
  Filled 2024-05-02: qty 1

## 2024-05-02 MED ORDER — THROMBIN 20000 UNITS EX SOLR
CUTANEOUS | Status: AC
  Filled 2024-05-02: qty 20000

## 2024-05-02 MED ORDER — CEFAZOLIN SODIUM-DEXTROSE 2-4 GM/100ML-% IV SOLN
2.0000 g | Freq: Three times a day (TID) | INTRAVENOUS | Status: AC
  Administered 2024-05-02 (×2): 2 g via INTRAVENOUS
  Filled 2024-05-02 (×2): qty 100

## 2024-05-02 MED ORDER — ACETAMINOPHEN 325 MG PO TABS: 650.0000 mg | ORAL_TABLET | ORAL | Status: DC | PRN

## 2024-05-02 MED ORDER — ONDANSETRON HCL 4 MG/2ML IJ SOLN
INTRAMUSCULAR | Status: DC | PRN
Start: 2024-05-02 — End: 2024-05-02
  Administered 2024-05-02: 4 mg via INTRAVENOUS

## 2024-05-02 MED ORDER — PROPOFOL 10 MG/ML IV BOLUS
INTRAVENOUS | Status: AC
Start: 2024-05-02 — End: 2024-05-02
  Filled 2024-05-02: qty 20

## 2024-05-02 MED ORDER — OXYCODONE HCL 5 MG PO TABS: 5.0000 mg | ORAL_TABLET | Freq: Once | ORAL | Status: DC | PRN

## 2024-05-02 MED ORDER — TAMSULOSIN HCL 0.4 MG PO CAPS
0.4000 mg | ORAL_CAPSULE | Freq: Every day | ORAL | Status: DC
  Administered 2024-05-02: 0.4 mg via ORAL
  Filled 2024-05-02: qty 1

## 2024-05-02 MED ORDER — GABAPENTIN 300 MG PO CAPS
300.0000 mg | ORAL_CAPSULE | ORAL | Status: AC
  Administered 2024-05-02: 300 mg via ORAL
  Filled 2024-05-02: qty 1

## 2024-05-02 MED ORDER — SODIUM CHLORIDE 0.9 % IV SOLN
250.0000 mL | INTRAVENOUS | Status: DC
  Administered 2024-05-02: 250 mL via INTRAVENOUS

## 2024-05-02 MED ORDER — SUGAMMADEX SODIUM 200 MG/2ML IV SOLN
INTRAVENOUS | Status: DC | PRN
Start: 2024-05-02 — End: 2024-05-02
  Administered 2024-05-02: 200 mg via INTRAVENOUS

## 2024-05-02 MED ORDER — ROCURONIUM BROMIDE 10 MG/ML (PF) SYRINGE
PREFILLED_SYRINGE | INTRAVENOUS | Status: DC | PRN
Start: 2024-05-02 — End: 2024-05-02
  Administered 2024-05-02: 60 mg via INTRAVENOUS
  Administered 2024-05-02: 20 mg via INTRAVENOUS

## 2024-05-02 MED ORDER — PHENYLEPHRINE HCL-NACL 20-0.9 MG/250ML-% IV SOLN
INTRAVENOUS | Status: DC | PRN
  Administered 2024-05-02: 30 ug/min via INTRAVENOUS

## 2024-05-02 MED ORDER — PANTOPRAZOLE SODIUM 40 MG PO TBEC: 40.0000 mg | DELAYED_RELEASE_TABLET | Freq: Every day | ORAL | Status: DC

## 2024-05-02 MED ORDER — PHENYLEPHRINE 80 MCG/ML (10ML) SYRINGE FOR IV PUSH (FOR BLOOD PRESSURE SUPPORT)
PREFILLED_SYRINGE | INTRAVENOUS | Status: DC | PRN
  Administered 2024-05-02: 80 ug via INTRAVENOUS

## 2024-05-02 MED ORDER — PROPOFOL 10 MG/ML IV BOLUS
INTRAVENOUS | Status: DC | PRN
Start: 2024-05-02 — End: 2024-05-02
  Administered 2024-05-02: 170 mg via INTRAVENOUS

## 2024-05-02 MED ORDER — SODIUM CHLORIDE 0.9% FLUSH: 3.0000 mL | INTRAVENOUS | Status: DC | PRN

## 2024-05-02 MED ORDER — CELECOXIB 200 MG PO CAPS
200.0000 mg | ORAL_CAPSULE | Freq: Two times a day (BID) | ORAL | Status: DC
  Administered 2024-05-02: 200 mg via ORAL
  Filled 2024-05-02: qty 1

## 2024-05-02 MED ORDER — ACETAMINOPHEN 650 MG RE SUPP: 650.0000 mg | RECTAL | Status: DC | PRN

## 2024-05-02 MED ORDER — FENTANYL CITRATE (PF) 100 MCG/2ML IJ SOLN: 25.0000 ug | INTRAMUSCULAR | Status: DC | PRN

## 2024-05-02 MED ORDER — ORAL CARE MOUTH RINSE: 15.0000 mL | Freq: Once | OROMUCOSAL | Status: AC

## 2024-05-02 MED ORDER — METHOCARBAMOL 1000 MG/10ML IJ SOLN: 500.0000 mg | Freq: Four times a day (QID) | INTRAMUSCULAR | Status: DC | PRN

## 2024-05-02 SURGICAL SUPPLY — 36 items
BAG COUNTER SPONGE SURGICOUNT (BAG) ×1 IMPLANT
BAND RUBBER #18 3X1/16 STRL (MISCELLANEOUS) ×2 IMPLANT
BENZOIN TINCTURE PRP APPL 2/3 (GAUZE/BANDAGES/DRESSINGS) ×1 IMPLANT
BUR CARBIDE MATCH 3.0 (BURR) ×1 IMPLANT
CANISTER SUCTION 3000ML PPV (SUCTIONS) ×1 IMPLANT
DRAPE LAPAROTOMY 100X72X124 (DRAPES) ×1 IMPLANT
DRAPE MICROSCOPE SLANT 54X150 (MISCELLANEOUS) ×1 IMPLANT
DRAPE SURG 17X23 STRL (DRAPES) ×1 IMPLANT
DRESSING AQUACEL AG SP 3.5X6 (GAUZE/BANDAGES/DRESSINGS) IMPLANT
DURAPREP 26ML APPLICATOR (WOUND CARE) ×1 IMPLANT
ELECTRODE REM PT RTRN 9FT ADLT (ELECTROSURGICAL) ×1 IMPLANT
GAUZE 4X4 16PLY ~~LOC~~+RFID DBL (SPONGE) IMPLANT
GLOVE BIO SURGEON STRL SZ7 (GLOVE) IMPLANT
GLOVE BIO SURGEON STRL SZ8 (GLOVE) ×1 IMPLANT
GLOVE BIOGEL PI IND STRL 7.0 (GLOVE) IMPLANT
GOWN STRL REUS W/ TWL LRG LVL3 (GOWN DISPOSABLE) IMPLANT
GOWN STRL REUS W/ TWL XL LVL3 (GOWN DISPOSABLE) ×1 IMPLANT
GOWN STRL REUS W/TWL 2XL LVL3 (GOWN DISPOSABLE) IMPLANT
GOWN STRL SURGICAL XL XLNG (GOWN DISPOSABLE) IMPLANT
HEMOSTAT POWDER KIT SURGIFOAM (HEMOSTASIS) ×1 IMPLANT
KIT BASIN OR (CUSTOM PROCEDURE TRAY) ×1 IMPLANT
KIT TURNOVER KIT B (KITS) ×1 IMPLANT
NDL HYPO 25X1 1.5 SAFETY (NEEDLE) ×1 IMPLANT
NDL SPNL 20GX3.5 QUINCKE YW (NEEDLE) IMPLANT
NEEDLE HYPO 25X1 1.5 SAFETY (NEEDLE) ×1 IMPLANT
NEEDLE SPNL 20GX3.5 QUINCKE YW (NEEDLE) IMPLANT
NS IRRIG 1000ML POUR BTL (IV SOLUTION) ×1 IMPLANT
PACK LAMINECTOMY NEURO (CUSTOM PROCEDURE TRAY) ×1 IMPLANT
PAD ARMBOARD POSITIONER FOAM (MISCELLANEOUS) ×3 IMPLANT
STRIP CLOSURE SKIN 1/2X4 (GAUZE/BANDAGES/DRESSINGS) ×1 IMPLANT
SUT VIC AB 0 CT1 18XCR BRD8 (SUTURE) ×1 IMPLANT
SUT VIC AB 2-0 CP2 18 (SUTURE) ×1 IMPLANT
SUT VIC AB 3-0 SH 8-18 (SUTURE) ×1 IMPLANT
TOWEL GREEN STERILE (TOWEL DISPOSABLE) ×1 IMPLANT
TOWEL GREEN STERILE FF (TOWEL DISPOSABLE) ×1 IMPLANT
WATER STERILE IRR 1000ML POUR (IV SOLUTION) ×1 IMPLANT

## 2024-05-02 NOTE — Plan of Care (Signed)

## 2024-05-02 NOTE — Op Note (Signed)
 05/02/2024  2:06 PM  PATIENT:  [name] [name]  83 y.o. male  PRE-OPERATIVE DIAGNOSIS:  lumbar spinal stenosis with neurogenic claudication  POST-OPERATIVE DIAGNOSIS:  same  PROCEDURE:  decompressive lumbar hemilaminectomy and medical facetectomy with foraminotomy L3-4 L4-5 L5-S1  SURGEON:  Alm Molt, MD  ASSISTANTS: Meyran FNP  ANESTHESIA:   General  EBL: 200 ml  Total I/O In: 1000 [I.V.:1000] Out: 200 [Blood:200]  BLOOD ADMINISTERED: none  DRAINS: med hemovac  SPECIMEN:  none  INDICATION FOR PROCEDURE: This patient presented with back and leg pain. Imaging showed stenosis. The patient tried conservative measures without relief. Pain was debilitating. Recommended decompressive laminectomy. Patient understood the risks, benefits, and alternatives and potential outcomes and wished to proceed.  PROCEDURE DETAILS: The patient was taken to the operating room and after induction of adequate generalized endotracheal anesthesia, the patient was rolled into the prone position on chest rolls and all pressure points were padded. The lumbar region was cleaned and then prepped with DuraPrep and draped in the usual sterile fashion. 10 cc of local anesthesia was injected and then a dorsal midline incision was made and carried down to the lumbo sacral fascia. The fascia was opened and the paraspinous musculature was taken down in a subperiosteal fashion to expose L3-4 L4-5 L5-S1 bilaterally. Intraoperative x-ray confirmed my level, and then I used a combination of the high-speed drill and the Kerrison punches to perform a hemilaminectomy, medial facetectomy, and foraminotomy at L3-4 L4-5 L5-S1  on the left and L3-4 and L5-S1 on the right. The underlying yellow ligament was opened and removed in a piecemeal fashion to expose the underlying dura and exiting nerve root. I undercut the lateral recess and dissected down until I was medial to and distal to the pedicle. The nerve root was well  decompressed. We then gently retracted the nerve root medially with a retractor, coagulated the epidural venous vasculature, and inspected the disc space.  I then palpated with a coronary dilator along the nerve root and into the foramen to assure adequate decompression. I felt no more compression of the nerve root. I irrigated with 0.5% iodine solution and saline solution. Achieved hemostasis with bipolar cautery, lined the dura with Gelfoam, placed a med hemovac drain,and then closed the fascia with 0 Vicryl. I closed the subcutaneous tissues with 2-0 Vicryl and the subcuticular tissues with 3-0 Vicryl. The skin was then closed with benzoin and Steri-Strips. The drapes were removed, a sterile dressing was applied.  My nurse practitioner was involved in the exposure, safe retraction of the neural elements, the disc work and the closure. the patient was awakened from general anesthesia and transferred to the recovery room in stable condition. At the end of the procedure all sponge, needle and instrument counts were correct.    PLAN OF CARE: Admit for overnight observation  PATIENT DISPOSITION:  PACU - hemodynamically stable.   Delay start of Pharmacological VTE agent (>24hrs) due to surgical blood loss or risk of bleeding:  yes

## 2024-05-02 NOTE — Anesthesia Procedure Notes (Signed)
 Procedure Name: Intubation Date/Time: 05/02/2024 11:37 AM  Performed by: Obadiah Reyes BROCKS, CRNAPre-anesthesia Checklist: Patient identified, Emergency Drugs available, Suction available and Patient being monitored Patient Re-evaluated:Patient Re-evaluated prior to induction Oxygen Delivery Method: Circle System Utilized Preoxygenation: Pre-oxygenation with 100% oxygen Induction Type: IV induction Ventilation: Mask ventilation without difficulty Laryngoscope Size: Miller and 2 Grade View: Grade II Tube type: Oral Number of attempts: 1 Airway Equipment and Method: Stylet and Oral airway Placement Confirmation: ETT inserted through vocal cords under direct vision, positive ETCO2 and breath sounds checked- equal and bilateral Secured at: 23 cm Tube secured with: Tape Dental Injury: Teeth and Oropharynx as per pre-operative assessment

## 2024-05-02 NOTE — Transfer of Care (Signed)
 Immediate Anesthesia Transfer of Care Note  Patient: [name] [name]  Procedure(s) Performed: LUMBAR LAMINECTOMY/DECOMPRESSION MICRODISCECTOMY LUMBAR THREE-LUMBAR FOUR - LUMBAR FOUR-LUMBAR FIVE - LUMBAR FIVE-SACRAL ONE - bilateral (Bilateral: Back)  Patient Location: PACU  Anesthesia Type:General  Level of Consciousness: sedated and responds to stimulation  Airway & Oxygen Therapy: Patient Spontanous Breathing and Patient connected to nasal cannula oxygen  Post-op Assessment: Report given to RN and Post -op Vital signs reviewed and stable  Post vital signs: Reviewed and stable  Last Vitals:  Vitals Value Taken Time  BP 120/68 05/02/24 14:15  Temp    Pulse 77 05/02/24 14:17  Resp 17 05/02/24 14:17  SpO2 98 % 05/02/24 14:17  Vitals shown include unfiled device data.  Last Pain:  Vitals:   05/02/24 1049  TempSrc:   PainSc: 0-No pain         Complications: No notable events documented.

## 2024-05-02 NOTE — H&P (Signed)
 Subjective: Patient is a 83 y.o. male admitted for stenosis. Onset of symptoms was several months ago, gradually worsening since that time.  The pain is rated severe, and is located at the across the lower back and radiates to legs. The pain is described as aching and occurs all day. The symptoms have been progressive. Symptoms are exacerbated by exercise, standing, and walking for more than a few minutes. MRI or CT showed lumbar stenosis   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   LIMA to LAD, SVG to OM2, SVG to PDA, EVH via right thigh   S/P carotid endarterectomy 02/29/2012   Right CEA for asymptomatic severe right ICA stenosis    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   Left Ear Rim (Cx3,5FU)   Shortness of breath    with exertion   Sleep apnea    CPAP, sleep study at [facility]   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   80-99% stenosis RICA, asymptomatic   Thyroid  nodule    Wears glasses    or contacts    Past Surgical History:  Procedure Laterality Date   ADENOIDECTOMY     CARDIAC CATHETERIZATION     02/22/12   CARDIOVASCULAR STRESS TEST  7/13   CAROTID ENDARTERECTOMY     COLONOSCOPY  15-20years and 12/04/11   Done at [facility]   COLONOSCOPY  12/11/2011   Procedure: COLONOSCOPY;  Surgeon: Claudis RAYMOND Rivet, MD;  Location: [facility] ENDO SUITE;  Service: Endoscopy;  Laterality: N/A;  830   COLONOSCOPY WITH PROPOFOL  N/A  11/29/2021   Procedure: COLONOSCOPY WITH PROPOFOL ;  Surgeon: Eartha Angelia Sieving, MD;  Location: [facility] ENDO SUITE;  Service: Gastroenterology;  Laterality: N/A;  1105 ASA 2   CORONARY ARTERY BYPASS GRAFT  02/29/2012   Procedure: CORONARY ARTERY BYPASS GRAFTING (CABG);  Surgeon: Sudie VEAR Laine, MD;  Location: [facility] OR;  Service: Open Heart Surgery;  Laterality: N/A;  times three using Left Internal Mammary Artery and Right Greater Saphenous Vein Graft Harvested Endoscopically   CORONARY STENT PLACEMENT     ENDARTERECTOMY  02/29/2012   Procedure: ENDARTERECTOMY CAROTID;  Surgeon: Lonni GORMAN Blade, MD;  Location: [facility] OR;  Service: Vascular;  Laterality: Right;   FOOT SURGERY  06/30/11   artificial joint in big toe right foot    KNEE ARTHROSCOPY WITH MEDIAL MENISECTOMY Left 07/15/2019   Procedure: KNEE ARTHROSCOPY WITH MEDIAL MENISCECTOMY AND LATERAL MENISCECTOMY;  Surgeon: Margrette Taft BRAVO, MD;  Location: [facility] ORS;  Service: Orthopedics;  Laterality: Left;   LEFT HEART CATHETERIZATION WITH CORONARY ANGIOGRAM N/A 02/20/2012   Procedure: LEFT HEART CATHETERIZATION WITH CORONARY ANGIOGRAM;  Surgeon: Debby DELENA Sor, MD;  Location: [facility] CATH LAB;  Service: Cardiovascular;  Laterality: N/A;   NASAL SEPTUM SURGERY     Totol thyroidectomy  02/06/2011   TRANSESOPHAGEAL ECHOCARDIOGRAM  UPPER GASTROINTESTINAL ENDOSCOPY  35-40 years ago   [facility]    Prior to Admission medications   Medication Sig Start Date End Date Taking? Authorizing Provider  amitriptyline  (ELAVIL ) 25 MG tablet Take 25 mg by mouth at bedtime. 03/12/23  Yes [provider]  amLODipine  (NORVASC ) 10 MG tablet Take 1 tablet (10 mg total) by mouth daily. 09/07/22 04/24/24 Yes BranchDorn FALCON, MD  aspirin  EC 81 MG tablet Take 81 mg by mouth in the morning.   Yes [provider]  DULoxetine  (CYMBALTA ) 30 MG capsule Take 1 capsule (30 mg total) by mouth daily. 01/21/21  Yes [name], [name] K, DO  esomeprazole (NEXIUM) 20 MG  capsule Take 20 mg by mouth daily as needed (acid reflux). 08/19/14  Yes [provider]  folic acid  (FOLVITE ) 1 MG tablet Take 1 mg by mouth daily.   Yes [provider]  gabapentin  (NEURONTIN ) 300 MG capsule TAKE (1) CAPSULE BY MOUTH TWICE DAILY. Patient taking differently: Take 300 mg by mouth 2 (two) times daily as needed (pain). 08/13/23  Yes Carlan, Chelsea L, NP  HYDROcodone -acetaminophen  (NORCO/VICODIN) 5-325 MG tablet Take 1 tablet by mouth every 6 (six) hours as needed for moderate pain (pain score 4-6). 12/31/23  Yes [provider]  metoprolol  (LOPRESSOR ) 50 MG tablet Take 1 tablet (50 mg total) by mouth 2 (two) times daily. 09/23/13  Yes BranchDorn FALCON, MD  Multiple Vitamins-Minerals (PRESERVISION AREDS 2 PO) Take 1 tablet by mouth 2 (two) times daily.    Yes [provider]  NON FORMULARY Pt uses a cpap nightly   Yes [provider]  Omega-3 Fatty Acids (FISH OIL) 1000 MG CPDR Take 1,000-2,000 mg by mouth See admin instructions. Take 2000 mg by mouth once daily in the morning & take 1000 mg  in the evening.   Yes [provider]  ramipril  (ALTACE ) 10 MG capsule Take 10 mg by mouth in the morning. 03/03/13  Yes [provider]  rosuvastatin  (CRESTOR ) 40 MG tablet TAKE ONE TABLET BY MOUTH EVERY DAY 03/19/24  Yes Alvan Dorn FALCON, MD  SYNTHROID  112 MCG tablet Take 112 mcg by mouth daily before breakfast.  08/15/18  Yes [provider]  tamsulosin  (FLOMAX ) 0.4 MG CAPS capsule Take 0.4 mg by mouth daily.   Yes [provider]  zolpidem  (AMBIEN ) 10 MG tablet Take 1 tablet (10 mg total) by mouth at bedtime. 04/30/24   Hope Almarie ORN, NP   No Known Allergies  Social History   Tobacco Use   Smoking status: Former    Current packs/day: 0.00    Average packs/day: 0.5 packs/day for 15.0 years (7.5 ttl pk-yrs)    Types: Cigarettes    Start date: 09/04/1986    Quit date: 09/04/2001    Years since quitting: 22.6     Passive exposure: Current   Smokeless tobacco: Never   Tobacco comments:    smoked 55yrs, smoked 1/2 pack a day  Substance Use Topics   Alcohol use: Yes    Alcohol/week: 3.0 - 4.0 standard drinks of alcohol    Types: 3 - 4 Cans of beer per week    Comment: social- beer , not on regular basis    Family History  Problem Relation Age of Onset   Hypertension Mother    Healthy Sister    Healthy Brother    Healthy Son    Heart attack Father    Heart disease Father  Before age 20   Hyperlipidemia Father    Hypertension Father    Prostate cancer Father    Colon cancer Neg Hx      Review of Systems  Positive ROS: neg  All other systems have been reviewed and were otherwise negative with the exception of those mentioned in the HPI and as above.  Objective: Vital signs in last 24 hours: Temp:  [98.2 F (36.8 C)] 98.2 F (36.8 C) (09/19 1026) Pulse Rate:  [75] 75 (09/19 1026) Resp:  [20] 20 (09/19 1026) BP: (173)/(83) 173/83 (09/19 1026) SpO2:  [96 %] 96 % (09/19 1026) Weight:  [95.3 kg] 95.3 kg (09/19 1026)  General Appearance: Alert, cooperative, no distress, appears stated age Head: Normocephalic, without obvious abnormality, atraumatic Eyes: PERRL, conjunctiva/corneas clear, EOM's intact    Neck: Supple, symmetrical, trachea midline Back: Symmetric, no curvature, ROM normal, no CVA tenderness Lungs:  respirations unlabored Heart: Regular rate and rhythm Abdomen: Soft, non-tender Extremities: Extremities normal, atraumatic, no cyanosis or edema Pulses: 2+ and symmetric all extremities Skin: Skin color, texture, turgor normal, no rashes or lesions  NEUROLOGIC:   Mental status: Alert and oriented x4,  no aphasia, good attention span, fund of knowledge, and memory Motor Exam - grossly normal Sensory Exam - grossly normal Reflexes: 1+ Coordination - grossly normal Gait - grossly normal Balance - grossly normal Cranial Nerves: I: smell Not tested  II: visual  acuity  OS: nl    OD: nl  II: visual fields Full to confrontation  II: pupils Equal, round, reactive to light  III,VII: ptosis None  III,IV,VI: extraocular muscles  Full ROM  V: mastication Normal  V: facial light touch sensation  Normal  V,VII: corneal reflex  Present  VII: facial muscle function - upper  Normal  VII: facial muscle function - lower Normal  VIII: hearing Not tested  IX: soft palate elevation  Normal  IX,X: gag reflex Present  XI: trapezius strength  5/5  XI: sternocleidomastoid strength 5/5  XI: neck flexion strength  5/5  XII: tongue strength  Normal    Data Review Lab Results  Component Value Date   WBC 7.1 04/29/2024   HGB 14.1 04/29/2024   HCT 41.3 04/29/2024   MCV 90.6 04/29/2024   PLT 190 04/29/2024   Lab Results  Component Value Date   NA 134 (L) 04/29/2024   K 4.6 04/29/2024   CL 99 04/29/2024   CO2 25 04/29/2024   BUN 15 04/29/2024   CREATININE 1.32 (H) 04/29/2024   GLUCOSE 124 (H) 04/29/2024   Lab Results  Component Value Date   INR 1.43 02/29/2012    Assessment/Plan:  Estimated body mass index is 27.71 kg/m as calculated from the following:   Height as of this encounter: 6' 1 (1.854 m).   Weight as of this encounter: 95.3 kg. Patient admitted for DLL L3-4 L4-5 L5-S1. Patient has failed a reasonable attempt at conservative therapy.  I explained the condition and procedure to the patient and answered any questions.  Patient wishes to proceed with procedure as planned. Understands risks/ benefits and typical outcomes of procedure.   Alm GORMAN Molt 05/02/2024 10:40 AM

## 2024-05-02 NOTE — Anesthesia Postprocedure Evaluation (Signed)
 Anesthesia Post Note  Patient: Natalio D Wadsworth  Procedure(s) Performed: LUMBAR LAMINECTOMY/DECOMPRESSION MICRODISCECTOMY LUMBAR THREE-LUMBAR FOUR - LUMBAR FOUR-LUMBAR FIVE - LUMBAR FIVE-SACRAL ONE - bilateral (Bilateral: Back)     Patient location during evaluation: PACU Anesthesia Type: General Level of consciousness: awake and alert Pain management: pain level controlled Vital Signs Assessment: post-procedure vital signs reviewed and stable Respiratory status: spontaneous breathing, nonlabored ventilation, respiratory function stable and patient connected to nasal cannula oxygen Cardiovascular status: blood pressure returned to baseline and stable Postop Assessment: no apparent nausea or vomiting Anesthetic complications: no   No notable events documented.  Last Vitals:  Vitals:   05/02/24 1500 05/02/24 1529  BP: 125/68 (!) 124/93  Pulse: 64 62  Resp: 14 16  Temp: 36.7 C   SpO2: 92% 94%    Last Pain:  Vitals:   05/02/24 1500  TempSrc:   PainSc: 0-No pain                 Epifanio Lamar BRAVO

## 2024-05-03 ENCOUNTER — Encounter (HOSPITAL_COMMUNITY): Payer: Self-pay | Admitting: Neurological Surgery

## 2024-05-03 DIAGNOSIS — I2510 Atherosclerotic heart disease of native coronary artery without angina pectoris: Secondary | ICD-10-CM | POA: Diagnosis not present

## 2024-05-03 DIAGNOSIS — Z951 Presence of aortocoronary bypass graft: Secondary | ICD-10-CM | POA: Diagnosis not present

## 2024-05-03 DIAGNOSIS — E039 Hypothyroidism, unspecified: Secondary | ICD-10-CM | POA: Diagnosis not present

## 2024-05-03 DIAGNOSIS — Z7982 Long term (current) use of aspirin: Secondary | ICD-10-CM | POA: Diagnosis not present

## 2024-05-03 DIAGNOSIS — I509 Heart failure, unspecified: Secondary | ICD-10-CM | POA: Diagnosis not present

## 2024-05-03 DIAGNOSIS — I252 Old myocardial infarction: Secondary | ICD-10-CM | POA: Diagnosis not present

## 2024-05-03 DIAGNOSIS — I110 Hypertensive heart disease with heart failure: Secondary | ICD-10-CM | POA: Diagnosis not present

## 2024-05-03 DIAGNOSIS — Z85828 Personal history of other malignant neoplasm of skin: Secondary | ICD-10-CM | POA: Diagnosis not present

## 2024-05-03 DIAGNOSIS — M48062 Spinal stenosis, lumbar region with neurogenic claudication: Secondary | ICD-10-CM | POA: Diagnosis not present

## 2024-05-03 MED ORDER — METHOCARBAMOL 500 MG PO TABS: 500.0000 mg | ORAL_TABLET | Freq: Four times a day (QID) | ORAL | 2 refills | Status: DC | PRN

## 2024-05-03 MED ORDER — HYDROCODONE-ACETAMINOPHEN 5-325 MG PO TABS: 1.0000 | ORAL_TABLET | ORAL | 0 refills | Status: AC | PRN

## 2024-05-03 MED ORDER — SENNA 8.6 MG PO TABS: 1.0000 | ORAL_TABLET | Freq: Two times a day (BID) | ORAL | 0 refills | Status: AC | PRN

## 2024-05-03 NOTE — Discharge Summary (Signed)
 Patient ID: KEINAN BROUILLET MRN: [id number] DOB/AGE: [birth year] 81 y.o.  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 male who was admitted following a lumbar laminectomy. They were recovered in PACU and transferred to the floor. Hospital course was uncomplicated. Pt stable for discharge today. Pt to f/u in office for routine post op visit. Pt is in agreement w/ plan.    Discharge Exam: Blood pressure 125/65, pulse 66, temperature 98.1 F (36.7 C), temperature source Oral, resp. rate 19, height 6' 1 (1.854 m), weight 95.3 kg, SpO2 96%. A&O Speech fluent, appropriate Strength grossly intact BUE/BLE.  SILTx4.  Dressing c/d/I.   Disposition: Discharge disposition: 01-Home or Self Care       Discharge Instructions     If the dressing is still on your incision site when you go home, remove it on the third day after your surgery date. Remove dressing if it begins to fall off, or if it is dirty or damaged before the third day.   Complete by: As directed       Allergies as of 05/03/2024   No Known Allergies      Medication List     PAUSE taking these medications    aspirin  EC 81 MG tablet Wait to take this until: May 09, 2024 Take 81 mg by mouth in the morning.       TAKE these medications    amitriptyline  25 MG tablet Commonly known as: ELAVIL  Take 25 mg by mouth at bedtime.   amLODipine  10 MG tablet Commonly known as: NORVASC  Take 1 tablet (10 mg total) by mouth daily.   DULoxetine  30 MG capsule Commonly known as: Cymbalta  Take 1 capsule (30 mg total) by mouth daily.   esomeprazole 20 MG capsule Commonly known as: NEXIUM Take 20 mg by mouth daily as needed (acid reflux).   Fish Oil 1000 MG Cpdr Take  1,000-2,000 mg by mouth See admin instructions. Take 2000 mg by mouth once daily in the morning & take 1000 mg  in the evening.   folic acid  1 MG tablet Commonly known as: FOLVITE  Take 1 mg by mouth daily.   gabapentin  300 MG capsule Commonly known as: NEURONTIN  TAKE (1) CAPSULE BY MOUTH TWICE DAILY. What changed: See the new instructions.   HYDROcodone -acetaminophen  5-325 MG tablet Commonly known as: NORCO/VICODIN Take 1 tablet by mouth every 4 (four) hours as needed for moderate pain (pain score 4-6). What changed: when to take this   methocarbamol  500 MG tablet Commonly known as: ROBAXIN  Take 1 tablet (500 mg total) by mouth every 6 (six) hours as needed for muscle spasms.   metoprolol  tartrate 50 MG tablet Commonly known as: LOPRESSOR  Take 1 tablet (50 mg total) by mouth 2 (two) times daily.   NON FORMULARY Pt uses a cpap nightly   PRESERVISION AREDS 2 PO Take 1 tablet by mouth 2 (two) times daily.   ramipril  10 MG capsule Commonly known as: ALTACE  Take 10 mg by mouth in the morning.   rosuvastatin  40 MG tablet Commonly known as: CRESTOR  TAKE ONE TABLET BY MOUTH EVERY DAY   senna 8.6 MG Tabs tablet Commonly known as: SENOKOT Take 1 tablet (8.6 mg total) by mouth 2 (two) times daily as needed for mild constipation.   Synthroid  112 MCG tablet  Generic drug: levothyroxine  Take 112 mcg by mouth daily before breakfast.   tamsulosin  0.4 MG Caps capsule Commonly known as: FLOMAX  Take 0.4 mg by mouth daily.   zolpidem  10 MG tablet Commonly known as: AMBIEN  Take 1 tablet (10 mg total) by mouth at bedtime.               Discharge Care Instructions  (From admission, onward)           Start     Ordered   05/03/24 0000  If the dressing is still on your incision site when you go home, remove it on the third day after your surgery date. Remove dressing if it begins to fall off, or if it is dirty or damaged before the third day.        05/03/24 0848              Signed: CAMIE CAYLIN Yerachmiel Spinney 05/03/2024, 8:49 AM

## 2024-05-03 NOTE — Evaluation (Signed)
 Occupational Therapy Evaluation & Discharge Patient Details Name: [name] [name] MRN: [id number] DOB: 04-28-1941 Today's Date: 05/03/2024   History of Present Illness   Pt is a 83 y.o. male admitted for scheduled decompressive lumbar hemilaminectomy L3-S1.  PMH: CAD, CHF,  MI, HTN, OSA, hypothyroidism     Clinical Impressions Pt admitted based on above, and was seen based on problem list below. PTA pt was independent with ADLs and IADLs. Today pt is mod I to supervision level with ADLs using compensatory strategies.Pt able to ambulate in hallway 334ft and navigate 4 steps to enter home with use of hand rail and step to pattern. Provided and reviewed back precautions educational handout, pt able to verbalize and return demo understanding. OT encouraged pt to use shower chair to promote safety with LB bathing. Pt with no DME or follow up OT needs. All education complete no further acute OT needs identified, OT is signing off on this pt.         If plan is discharge home, recommend the following:   Assistance with cooking/housework;Help with stairs or ramp for entrance     Functional Status Assessment   Patient has had a recent decline in their functional status and demonstrates the ability to make significant improvements in function in a reasonable and predictable amount of time.     Equipment Recommendations   None recommended by OT      Precautions/Restrictions   Precautions Precautions: Fall;Back Precaution Booklet Issued: Yes (comment) Recall of Precautions/Restrictions: Intact Precaution/Restrictions Comments: No brace required Restrictions Weight Bearing Restrictions Per Provider Order: No     Mobility Bed Mobility Overal bed mobility: Needs Assistance Bed Mobility: Rolling, Sidelying to Sit, Sit to Sidelying Rolling: Supervision Sidelying to sit: Supervision     Sit to sidelying: Supervision General bed mobility comments: S for safety with log  rolling    Transfers Overall transfer level: Modified independent Equipment used: None     General transfer comment: Mod I to supervision to ambulate 358ft and navigate 4 steps to enter with step through pattern      Balance Overall balance assessment: Mild deficits observed, not formally tested       ADL either performed or assessed with clinical judgement   ADL Overall ADL's : Modified independent       General ADL Comments: Mod I with use of compensatory strategies for LB bathing, dressing, anad grooming tasks pt able to return demo understanding     Vision Baseline Vision/History: 0 No visual deficits Patient Visual Report: No change from baseline Vision Assessment?: No apparent visual deficits            Pertinent Vitals/Pain Pain Assessment Pain Assessment: Faces Faces Pain Scale: Hurts a little bit Pain Location: back Pain Descriptors / Indicators: Discomfort Pain Intervention(s): Monitored during session     Extremity/Trunk Assessment Upper Extremity Assessment Upper Extremity Assessment: Overall WFL for tasks assessed   Lower Extremity Assessment Lower Extremity Assessment: Overall WFL for tasks assessed   Cervical / Trunk Assessment Cervical / Trunk Assessment: Back Surgery   Communication Communication Communication: No apparent difficulties   Cognition Arousal: Alert Behavior During Therapy: WFL for tasks assessed/performed Cognition: No apparent impairments     OT - Cognition Comments: Poor stm, overall WFL       Following commands: Intact       Cueing  General Comments   Cueing Techniques: Verbal cues  VSS on RA           Home  Living Family/patient expects to be discharged to:: Private residence Living Arrangements: Spouse/significant other Available Help at Discharge: Family;Available PRN/intermittently Type of Home: House Home Access: Stairs to enter [employer] of Steps: 4 Entrance Stairs-Rails:  Right;Left Home Layout: One level     Bathroom Shower/Tub: Producer, television/film/video: Standard Bathroom Accessibility: Yes How Accessible: Accessible via walker Home Equipment: Rolling Walker (2 wheels);Rollator (4 wheels);Cane - single point;Grab bars - toilet;Grab bars - tub/shower;Shower seat          Prior Functioning/Environment Prior Level of Function : Independent/Modified Independent       Mobility Comments: No AD      OT Problem List: Impaired balance (sitting and/or standing)        OT Goals(Current goals can be found in the care plan section)   Acute Rehab OT Goals Patient Stated Goal: To go home OT Goal Formulation: All assessment and education complete, DC therapy Time For Goal Achievement: 05/17/24 Potential to Achieve Goals: Good   AM-PAC OT 6 Clicks Daily Activity     Outcome Measure Help from another person eating meals?: None Help from another person taking care of personal grooming?: None Help from another person toileting, which includes using toliet, bedpan, or urinal?: None Help from another person bathing (including washing, rinsing, drying)?: None Help from another person to put on and taking off regular upper body clothing?: None Help from another person to put on and taking off regular lower body clothing?: None 6 Click Score: 24   End of Session Nurse Communication: Mobility status  Activity Tolerance: Patient tolerated treatment well Patient left: in bed;with call bell/phone within reach;with family/visitor present  OT Visit Diagnosis: Unsteadiness on feet (R26.81);Other abnormalities of gait and mobility (R26.89)                Time: 9183-9167 OT Time Calculation (min): 16 min Charges:  OT General Charges $OT Visit: 1 Visit OT Evaluation $OT Eval Moderate Complexity: 1 Mod  Adrianne BROCKS, OT  Acute Rehabilitation Services Office [phone] Secure chat preferred   Adrianne GORMAN Savers 05/03/2024, 8:42 AM

## 2024-05-03 NOTE — Progress Notes (Signed)
Patient is discharged from room 3C07 at this time. Alert and in stable condition. IV site d/c'd and instructions read to patient and son with understanding verbalized and all questions answered. Left unit via wheelchair with all belongings at side. 

## 2024-05-03 NOTE — Care Management (Signed)
 Patient with order to DC to home today. Unit staff to provide DME needed for home.   No HH needs identified Patient will have family/ friends provide transportation home. No other TOC needs identified for DC

## 2024-05-03 NOTE — Care Management Obs Status (Signed)
 MEDICARE OBSERVATION STATUS NOTIFICATION   Patient Details  Name: [name] [name] MRN: [id number] Date of Birth: [birth year]   Medicare Observation Status Notification Given:  Yes    Jon Cruel 05/03/2024, 9:41 AM

## 2024-05-28 ENCOUNTER — Encounter (INDEPENDENT_AMBULATORY_CARE_PROVIDER_SITE_OTHER): Payer: Self-pay | Admitting: Gastroenterology

## 2024-06-10 DIAGNOSIS — K529 Noninfective gastroenteritis and colitis, unspecified: Secondary | ICD-10-CM | POA: Diagnosis not present

## 2024-06-16 DIAGNOSIS — K529 Noninfective gastroenteritis and colitis, unspecified: Secondary | ICD-10-CM | POA: Diagnosis not present

## 2024-06-23 ENCOUNTER — Other Ambulatory Visit: Payer: Self-pay | Admitting: Cardiology

## 2024-06-23 DIAGNOSIS — E782 Mixed hyperlipidemia: Secondary | ICD-10-CM

## 2024-06-25 ENCOUNTER — Other Ambulatory Visit: Payer: Self-pay | Admitting: Primary Care

## 2024-06-25 DIAGNOSIS — Z23 Encounter for immunization: Secondary | ICD-10-CM | POA: Diagnosis not present

## 2024-06-25 NOTE — Telephone Encounter (Signed)
 Patient came into office (11/12) and is requesting a refill of medication Zolpidem  (AMBIEN ) 10 MG tablet. I let the patient know to call his pharmacy and have them send a refill request over to us .

## 2024-06-30 ENCOUNTER — Encounter (INDEPENDENT_AMBULATORY_CARE_PROVIDER_SITE_OTHER): Admitting: Gastroenterology

## 2024-06-30 ENCOUNTER — Ambulatory Visit: Payer: Self-pay | Admitting: Primary Care

## 2024-06-30 NOTE — Telephone Encounter (Signed)
 FYI Only or Action Required?: Action required by provider: medication refill request. Ambien , has been out for 1-2 weeks and reports only sleeping a few hours per night  Patient is followed in Pulmonology for OSA, last seen on 02/04/2024 by Hope Almarie ORN, NP.  Called Nurse Triage reporting Medication Refill.  Symptoms began several weeks ago.  Interventions attempted: Other: Med question only.  Symptoms are: gradually worsening.  Triage Disposition: Call PCP When Office is Open  Patient/caregiver understands and will follow disposition?: Yes  Copied from CRM #[id number]. Topic: Clinical - Pink [name] Triage >> Jun 30, 2024  1:01 PM [name] wrote: [name] [name] triggered transfer to Nurse Triage. See Triage Message for details. pt called to follow up on request for  zolpidem  (AMBIEN ) 10 MG tablet He said he stopped by office this morning.  However, [name] states he picked up this medication for 90 days on Sept 17, so he should not be out.  Pt remembers dropping the bottle one night, but he does not think that many went down the drain.  Pt states he is only sleeping 1-2 hours/night.  He asked if the dr can send in something else to get him through to 12/17 when he can get th     Reason for Disposition  Caller requesting a CONTROLLED substance prescription refill (e.g., narcotics, ADHD medicines)  Answer Assessment - Initial Assessment Questions Pt calling asking if he can have ambien  refilled. Had a 90 day supply filled on 9/17 and should not be out. Reports he dropped his bottle of medication once and remembers a few pills going down the drain but not more than that. Reports he has been out for 1-2 weeks and has only been sleeping a few hours per night. Routing request to office.  1. DRUG NAME: What medicine do you need to have refilled?     Ambien  10 mg tablet  2. REFILLS REMAINING: How many refills are remaining? Notes: The label on the medicine or pill bottle will show  how many refills are remaining. If there are no refills remaining, then a renewal may be needed.     0  4. PRESCRIBER: Who prescribed it? Note: The prescribing doctor or group is responsible for refill approvals.SABRA Almarie Hope, NP  5. PHARMACY: Have you contacted your pharmacy (drugstore)? Note: Some pharmacies will contact the doctor (or NP/PA).      [facility] in [place], [place]  6. SYMPTOMS: Do you have any symptoms?     Pt only sleeping a few hrs per night  Protocols used: Medication Refill and Renewal Call-A-AH

## 2024-06-30 NOTE — Telephone Encounter (Signed)
 Copied from CRM #[id number]. Topic: Clinical - Pink [name] Triage >> Jun 30, 2024  1:01 PM [name] wrote: [name] [name] triggered transfer to Nurse Triage. See Triage Message for details. pt called to follow up on request for  zolpidem  (AMBIEN ) 10 MG tablet He said he stopped by office this morning.  However, [name] [name] states he picked up this medication for 90 days on Sept 17, so he should not be out.  Pt remembers dropping the bottle one night, but he does not think that many went down the drain.  Pt states he is only sleeping 1-2 hours/night.  He asked if the dr can send in something else to get him through to 12/17 when he can get th

## 2024-07-01 NOTE — Telephone Encounter (Signed)
 He is a patient of [name], she will have to sign the request for a refill. I called [facility] and they faxed over a medication request to our [place] office as she is no longer at our [facility].

## 2024-07-01 NOTE — Telephone Encounter (Signed)
 I can not refill this medication  Please advise

## 2024-07-02 MED ORDER — ZOLPIDEM TARTRATE 10 MG PO TABS: 10.0000 mg | ORAL_TABLET | Freq: Every day | ORAL | 0 refills | Status: AC

## 2024-07-03 NOTE — Telephone Encounter (Signed)
 Rx been filled

## 2024-07-09 DIAGNOSIS — F5104 Psychophysiologic insomnia: Secondary | ICD-10-CM | POA: Diagnosis not present

## 2024-07-14 ENCOUNTER — Ambulatory Visit (INDEPENDENT_AMBULATORY_CARE_PROVIDER_SITE_OTHER): Admitting: Gastroenterology

## 2024-07-21 ENCOUNTER — Ambulatory Visit (INDEPENDENT_AMBULATORY_CARE_PROVIDER_SITE_OTHER): Admitting: Gastroenterology

## 2024-08-05 ENCOUNTER — Ambulatory Visit (INDEPENDENT_AMBULATORY_CARE_PROVIDER_SITE_OTHER): Admitting: Gastroenterology

## 2024-08-05 ENCOUNTER — Telehealth (INDEPENDENT_AMBULATORY_CARE_PROVIDER_SITE_OTHER): Payer: Self-pay

## 2024-08-05 ENCOUNTER — Encounter (INDEPENDENT_AMBULATORY_CARE_PROVIDER_SITE_OTHER): Payer: Self-pay | Admitting: Gastroenterology

## 2024-08-05 VITALS — BP 102/61 | HR 57 | Temp 98.2°F | Ht 73.0 in | Wt 212.3 lb

## 2024-08-05 DIAGNOSIS — K6289 Other specified diseases of anus and rectum: Secondary | ICD-10-CM

## 2024-08-05 DIAGNOSIS — K640 First degree hemorrhoids: Secondary | ICD-10-CM

## 2024-08-05 DIAGNOSIS — G8929 Other chronic pain: Secondary | ICD-10-CM

## 2024-08-05 MED ORDER — GABAPENTIN 300 MG PO CAPS: 300.0000 mg | ORAL_CAPSULE | Freq: Two times a day (BID) | ORAL | 1 refills | Status: AC

## 2024-08-05 MED ORDER — PSYLLIUM 58.6 % PO PACK: 1.0000 | PACK | Freq: Two times a day (BID) | ORAL | 2 refills | Status: AC

## 2024-08-05 MED ORDER — HYDROCORTISONE ACETATE 25 MG RE SUPP: 25.0000 mg | Freq: Two times a day (BID) | RECTAL | 0 refills | Status: AC

## 2024-08-05 NOTE — Telephone Encounter (Signed)
 Spoke with patient, scheduled flex sig for 09/05/2024 at 9:30am. Instructions given to patient in person.

## 2024-08-05 NOTE — Patient Instructions (Signed)
 It was very nice to meet you today, as dicussed with will plan for the following :  1) Sigmoidoscopy  2) Gabapentin  twice daily  3)Ensure adequate fluid intake: Aim for 8 glasses of water  daily. Follow a high fiber diet: Include foods such as dates, prunes, pears, and kiwi. Use Metamucil twice a day.

## 2024-08-05 NOTE — Telephone Encounter (Signed)
 PA on [employer] for flex sig: Prior authorization is not required for this code.

## 2024-08-05 NOTE — Progress Notes (Signed)
 [name] [name] , M.D. Gastroenterology & Hepatology [facility] Gastroenterology [address] [place], [place] [address] Primary Care Physician: [name] Norleen PEDLAR, MD [address] [place] [name] [name] [address]  Chief Complaint:   rectal burning/pain and hemorrhoids.  History of Present Illness: [name] [name] is a 83 y.o. male  with past medical history of RLS, thyroid  disease, HLD, OSA, HTN, CKD, prediabetes,   who presents for follow up of rectal burning/pain and hemorrhoids.  Patient was last seen by Dr. Eartha on 11/2022 for rectal burning and hemorrhoids Patient was scheduled for sigmoidoscopy but was canceled because he did not have an escort back during the day of the procedure   Patient reports persistent burning sensation and pain perianal area.  No relationship with defecation.  Patient reports having the symptoms even before colonoscopy but seem to have worsened after banding.  Patient reports after spinal stenosis surgery severity of the pain did not change.  Patient would have a bowel movement every other day.Notably, the patient has his last hemorrhoidal banding on 02/15/2022. Previously  he tried taking Elavil  25 mg daily for few months but he did not have any improvement so he decided to stop this medication.  Patient was prescribed gabapentin  but he is unsure if he is taking it.  Denies any overt bleeding  Last Colonoscopy:11/29/21- Diverticulosis in the sigmoid colon. - Non-bleeding internal hemorrhoids. - No specimens collected. Last Endoscopy:never  Last labs from 04/2024 hemoglobin 14.1 platelet 190  Stool ova and parasite negative and stool culture negative Past Medical History: Past Medical History:  Diagnosis Date   Arthritis    CAD (coronary artery disease) 12/04/2011   Left Main Disease with 3- vessel CAD    CAD (coronary artery disease)    seeing Dr Alvan   CHF (congestive heart failure) (HCC)    Deviated septum     Dyslipidemia    GERD (gastroesophageal reflux disease)    Heart attack (HCC)    Hyperlipidemia    Hypertension    Hypothyroidism    MI (myocardial infarction) (HCC) 1997   Neuropathy    Obesity (BMI 30-39.9) 09/02/2018   PONV (postoperative nausea and vomiting)    has in past, no problems after most recent surgery   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   LIMA to LAD, SVG to OM2, SVG to PDA, EVH via right thigh   S/P carotid endarterectomy 02/29/2012   Right CEA for asymptomatic severe right ICA stenosis    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   Left Ear Rim (Cx3,5FU)   Shortness of breath    with exertion   Sleep apnea    CPAP, sleep [facility] at [facility]   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   80-99% stenosis RICA, asymptomatic   Thyroid  nodule    Wears glasses    or contacts    Past Surgical History: Past Surgical History:  Procedure Laterality Date   ADENOIDECTOMY     CARDIAC CATHETERIZATION     02/22/12   CARDIOVASCULAR STRESS TEST  7/13   CAROTID ENDARTERECTOMY     COLONOSCOPY  15-20years and 12/04/11   Done at [facility]   COLONOSCOPY  12/11/2011   Procedure: COLONOSCOPY;  Surgeon: Claudis RAYMOND Rivet, MD;  Location: [facility] ENDO SUITE;  Service: Endoscopy;  Laterality: N/A;  830   COLONOSCOPY WITH PROPOFOL  N/A 11/29/2021   Procedure: COLONOSCOPY WITH PROPOFOL ;  Surgeon: Eartha Angelia Sieving, MD;  Location: [facility] ENDO SUITE;  Service: Gastroenterology;  Laterality: N/A;  1105 ASA 2   CORONARY ARTERY BYPASS GRAFT  02/29/2012   Procedure: CORONARY ARTERY BYPASS GRAFTING (CABG);  Surgeon: Sudie VEAR Laine, MD;  Location: [facility] OR;  Service: Open Heart Surgery;  Laterality: N/A;  times three using Left Internal Mammary Artery and Right Greater Saphenous Vein Graft Harvested Endoscopically   CORONARY STENT PLACEMENT     ENDARTERECTOMY  02/29/2012   Procedure: ENDARTERECTOMY CAROTID;  Surgeon: Lonni GORMAN Blade, MD;  Location[facility] OR;  Service: Vascular;  Laterality: Right;   FOOT  SURGERY  06/30/11   artificial joint in big toe right foot    KNEE ARTHROSCOPY WITH MEDIAL MENISECTOMY Left 07/15/2019   Procedure: KNEE ARTHROSCOPY WITH MEDIAL MENISCECTOMY AND LATERAL MENISCECTOMY;  Surgeon: Margrette Taft BRAVO, MD;  Location: [facility] ORS;  Service: Orthopedics;  Laterality: Left;   LEFT HEART CATHETERIZATION WITH CORONARY ANGIOGRAM N/A 02/20/2012   Procedure: LEFT HEART CATHETERIZATION WITH CORONARY ANGIOGRAM;  Surgeon: Debby DELENA Sor, MD;  Location: [facility] CATH LAB;  Service: Cardiovascular;  Laterality: N/A;   LUMBAR LAMINECTOMY/DECOMPRESSION MICRODISCECTOMY Bilateral 05/02/2024   Procedure: LUMBAR LAMINECTOMY/DECOMPRESSION MICRODISCECTOMY LUMBAR THREE-LUMBAR FOUR - LUMBAR FOUR-LUMBAR FIVE - LUMBAR FIVE-SACRAL ONE - bilateral;  Surgeon: Joshua Alm Hamilton, MD;  Location: [facility] OR;  Service: Neurosurgery;  Laterality: Bilateral;   NASAL SEPTUM SURGERY     Totol thyroidectomy  02/06/2011   TRANSESOPHAGEAL ECHOCARDIOGRAM     UPPER GASTROINTESTINAL ENDOSCOPY  35-40 years ago   [facility]    Family History: Family History  Problem Relation Age of Onset   Hypertension Mother    Healthy Sister    Healthy Brother    Healthy Son    Heart attack Father    Heart disease Father        Before age 39   Hyperlipidemia Father    Hypertension Father    Prostate cancer Father    Colon cancer Neg Hx     Social History:Tobacco Use History[1] Social History   Substance and Sexual Activity  Alcohol Use Yes   Alcohol/week: 3.0 - 4.0 standard drinks of alcohol   Types: 3 - 4 Cans of beer per week   Comment: social- beer , not on regular basis   Social History   Substance and Sexual Activity  Drug Use No    Allergies: Allergies[2]  Medications: Current Outpatient Medications  Medication Sig Dispense Refill   amitriptyline  (ELAVIL ) 25 MG tablet Take 25 mg by mouth at bedtime.     amLODipine  (NORVASC ) 10 MG tablet Take 1 tablet (10 mg total) by mouth daily. 90 tablet 3   aspirin  EC  81 MG tablet Take 81 mg by mouth in the morning.     DULoxetine  (CYMBALTA ) 30 MG capsule Take 1 capsule (30 mg total) by mouth daily. 30 capsule 3   esomeprazole (NEXIUM) 20 MG capsule Take 20 mg by mouth daily as needed (acid reflux).     folic acid  (FOLVITE ) 1 MG tablet Take 1 mg by mouth daily.     gabapentin  (NEURONTIN ) 300 MG capsule TAKE (1) CAPSULE BY MOUTH TWICE DAILY. (Patient taking differently: Take 300 mg by mouth 2 (two) times daily as needed (pain).) 180 capsule 1   HYDROcodone -acetaminophen  (NORCO/VICODIN) 5-325 MG tablet Take 1 tablet by mouth every 4 (four) hours as needed for moderate pain (pain score 4-6). 30 tablet 0   metoprolol  (LOPRESSOR ) 50 MG tablet Take 1 tablet (50 mg total) by mouth 2 (two) times daily. 180 tablet 1   Multiple Vitamins-Minerals (  PRESERVISION AREDS 2 PO) Take 1 tablet by mouth 2 (two) times daily.      NON FORMULARY Pt uses a cpap nightly     Omega-3 Fatty Acids (FISH OIL) 1000 MG CPDR Take 1,000-2,000 mg by mouth See admin instructions. Take 2000 mg by mouth once daily in the morning & take 1000 mg  in the evening.     ramipril  (ALTACE ) 10 MG capsule Take 10 mg by mouth in the morning.     rosuvastatin  (CRESTOR ) 40 MG tablet TAKE ONE TABLET BY MOUTH EVERY DAY 90 tablet 2   senna (SENOKOT) 8.6 MG TABS tablet Take 1 tablet (8.6 mg total) by mouth 2 (two) times daily as needed for mild constipation. 30 tablet 0   SYNTHROID  112 MCG tablet Take 112 mcg by mouth daily before breakfast.      tamsulosin  (FLOMAX ) 0.4 MG CAPS capsule Take 0.4 mg by mouth daily.     zolpidem  (AMBIEN ) 10 MG tablet Take 1 tablet (10 mg total) by mouth at bedtime. 90 tablet 0   No current facility-administered medications for this visit.   Facility-Administered Medications Ordered in Other Visits  Medication Dose Route Frequency Provider Last Rate Last Admin   lactated ringers  infusion   Intravenous Continuous PRN Eliodoro Deward FALCON, CRNA   New Bag at 12/13/22 1142    Review of  Systems: GENERAL: negative for malaise, night sweats HEENT: No changes in hearing or vision, no nose bleeds or other nasal problems. NECK: Negative for lumps, goiter, pain and significant neck swelling RESPIRATORY: Negative for cough, wheezing CARDIOVASCULAR: Negative for chest pain, leg swelling, palpitations, orthopnea GI: SEE HPI MUSCULOSKELETAL: Negative for joint pain or swelling, back pain, and muscle pain. SKIN: Negative for lesions, rash HEMATOLOGY Negative for prolonged bleeding, bruising easily, and swollen nodes. ENDOCRINE: Negative for cold or heat intolerance, polyuria, polydipsia and goiter. NEURO: negative for tremor, gait imbalance, syncope and seizures. The remainder of the review of systems is noncontributory.   Physical Exam: BP 102/61   Pulse (!) 57   Temp 98.2 F (36.8 C)   Ht 6' 1 (1.854 m)   Wt 212 lb 4.8 oz (96.3 kg)   BMI 28.01 kg/m  GENERAL: The patient is AO x3, in no acute distress. HEENT: Head is normocephalic and atraumatic. EOMI are intact. Mouth is well hydrated and without lesions. NECK: Supple. No masses LUNGS: Clear to auscultation. No presence of rhonchi/wheezing/rales. Adequate chest expansion HEART: RRR, normal s1 and s2. ABDOMEN: Soft, nontender, no guarding, no peritoneal signs, and nondistended. BS +. No masses.   Imaging/Labs: as above     Latest Ref Rng & Units 04/29/2024   10:19 AM 07/09/2019    8:47 AM 01/20/2015    2:32 PM  CBC  WBC 4.0 - 10.5 K/uL 7.1  5.5  6.7   Hemoglobin 13.0 - 17.0 g/dL 85.8  85.5  84.5   Hematocrit 39.0 - 52.0 % 41.3  42.6  45.2   Platelets 150 - 400 K/uL 190  181  195    Lab Results  Component Value Date   IRON 65 01/20/2015   TIBC 239 (L) 01/20/2015   FERRITIN 86 01/20/2015    I personally reviewed and interpreted the available labs, imaging and endoscopic files.  Impression and Plan: Kainoah D Gioffre is a 83 y.o. male  with past medical history of RLS, thyroid  disease, HLD, OSA, HTN, CKD,  prediabetes,   who presents for follow up of rectal burning/pain and hemorrhoids.  #Rectal/perianal  discomfort   Patient has the symptoms for at least over a year with no relationship with  Food intake or defecation.  Previously thought to be due to spinal stenosis but since his surgery severity of the pain has not improved  Patient was prescribed gabapentin  but he is not sure if he is taking it  Given persistent pain would recommend flexib follow-up le sigmoidoscopy to evaluate his symptoms further  -Would recommend high-fiber diet and adequate fluid -Gabapentin  300 mg twice daily -Sigmoidoscopy -Anusol  suppository for 6 days twice daily -If persistent symptoms and normal flexible sigmoidoscopy, consider anorectal manometry /MRI Pelvis   All questions were answered.      Kyisha Fowle Faizan Fallan Mccarey, MD Gastroenterology and Hepatology [facility] Gastroenterology   This chart has been completed using [facility] Dictation software, and while attempts have been made to ensure accuracy , certain words and phrases may not be transcribed as intended      [1]  Social History Tobacco Use  Smoking Status Former   Current packs/day: 0.00   Average packs/day: 0.5 packs/day for 15.0 years (7.5 ttl pk-yrs)   Types: Cigarettes   Start date: 09/04/1986   Quit date: 09/04/2001   Years since quitting: 22.9   Passive exposure: Current  Smokeless Tobacco Never  Tobacco Comments   smoked 43yrs, smoked 1/2 pack a day  [2] No Known Allergies

## 2024-08-25 ENCOUNTER — Telehealth: Payer: Self-pay | Admitting: Gastroenterology

## 2024-08-25 NOTE — Telephone Encounter (Signed)
 Pt came to front desk to let us  know he wants to cancel his procedure with Dr Cinderella on 09/05/2024 due to his wife being in the hospital.

## 2024-08-25 NOTE — Telephone Encounter (Signed)
 Patient came into the office to cancel procedure due to his wife being hospitalized and he stated that he will call back when he is able to reschedule his procedure. Message sent to ENDO to cancel procedure.

## 2024-09-01 ENCOUNTER — Encounter (HOSPITAL_COMMUNITY)

## 2024-09-05 ENCOUNTER — Encounter (HOSPITAL_COMMUNITY): Payer: Self-pay

## 2024-09-05 ENCOUNTER — Ambulatory Visit (HOSPITAL_COMMUNITY): Admit: 2024-09-05 | Admitting: Gastroenterology

## 2024-09-05 SURGERY — SIGMOIDOSCOPY, FLEXIBLE
Anesthesia: Choice

## 2024-09-22 ENCOUNTER — Ambulatory Visit: Admitting: Cardiology

## 2024-09-29 ENCOUNTER — Ambulatory Visit: Admitting: Cardiology
